# Patient Record
Sex: Male | Born: 1950 | Race: Black or African American | Hispanic: No | Marital: Married | Smoking: Never smoker
Health system: Southern US, Community
[De-identification: ages and names within clinical notes are randomized; demographics above are authoritative.]

## PROBLEM LIST (undated history)

## (undated) DIAGNOSIS — I499 Cardiac arrhythmia, unspecified: Secondary | ICD-10-CM

## (undated) DIAGNOSIS — I679 Cerebrovascular disease, unspecified: Secondary | ICD-10-CM

## (undated) DIAGNOSIS — I10 Essential (primary) hypertension: Secondary | ICD-10-CM

## (undated) DIAGNOSIS — R7303 Prediabetes: Secondary | ICD-10-CM

## (undated) DIAGNOSIS — G4733 Obstructive sleep apnea (adult) (pediatric): Secondary | ICD-10-CM

## (undated) DIAGNOSIS — I6381 Other cerebral infarction due to occlusion or stenosis of small artery: Secondary | ICD-10-CM

## (undated) DIAGNOSIS — I4891 Unspecified atrial fibrillation: Secondary | ICD-10-CM

## (undated) DIAGNOSIS — E7800 Pure hypercholesterolemia, unspecified: Secondary | ICD-10-CM

## (undated) HISTORY — DX: Cerebrovascular disease, unspecified: I67.9

## (undated) HISTORY — PX: EYE SURGERY: SHX253

## (undated) HISTORY — DX: Cardiac arrhythmia, unspecified: I49.9

## (undated) HISTORY — PX: PROSTATE SURGERY: SHX751

## (undated) HISTORY — PX: GALLBLADDER SURGERY: SHX652

## (undated) HISTORY — DX: Other cerebral infarction due to occlusion or stenosis of small artery: I63.81

## (undated) HISTORY — DX: Obstructive sleep apnea (adult) (pediatric): G47.33

---

## 1998-04-11 ENCOUNTER — Emergency Department (HOSPITAL_COMMUNITY): Admission: EM | Admit: 1998-04-11 | Discharge: 1998-04-11 | Payer: Self-pay | Admitting: Emergency Medicine

## 1999-09-12 ENCOUNTER — Encounter: Payer: Self-pay | Admitting: Family Medicine

## 1999-09-12 ENCOUNTER — Encounter: Admission: RE | Admit: 1999-09-12 | Discharge: 1999-09-12 | Payer: Self-pay | Admitting: Family Medicine

## 1999-11-13 ENCOUNTER — Observation Stay (HOSPITAL_COMMUNITY): Admission: EM | Admit: 1999-11-13 | Discharge: 1999-11-14 | Payer: Self-pay | Admitting: Emergency Medicine

## 2000-11-04 ENCOUNTER — Encounter: Payer: Self-pay | Admitting: Family Medicine

## 2000-11-04 ENCOUNTER — Encounter: Admission: RE | Admit: 2000-11-04 | Discharge: 2000-11-04 | Payer: Self-pay | Admitting: Family Medicine

## 2000-12-01 ENCOUNTER — Emergency Department (HOSPITAL_COMMUNITY): Admission: EM | Admit: 2000-12-01 | Discharge: 2000-12-01 | Payer: Self-pay

## 2002-11-24 ENCOUNTER — Encounter: Payer: Self-pay | Admitting: Emergency Medicine

## 2002-11-24 ENCOUNTER — Emergency Department (HOSPITAL_COMMUNITY): Admission: EM | Admit: 2002-11-24 | Discharge: 2002-11-24 | Payer: Self-pay | Admitting: Emergency Medicine

## 2006-01-20 ENCOUNTER — Ambulatory Visit (HOSPITAL_COMMUNITY): Admission: RE | Admit: 2006-01-20 | Discharge: 2006-01-20 | Payer: Self-pay | Admitting: Gastroenterology

## 2008-09-15 ENCOUNTER — Emergency Department (HOSPITAL_COMMUNITY): Admission: EM | Admit: 2008-09-15 | Discharge: 2008-09-16 | Payer: Self-pay | Admitting: Emergency Medicine

## 2008-10-27 ENCOUNTER — Inpatient Hospital Stay (HOSPITAL_COMMUNITY): Admission: RE | Admit: 2008-10-27 | Discharge: 2008-11-03 | Payer: Self-pay | Admitting: Urology

## 2008-10-27 ENCOUNTER — Encounter: Payer: Self-pay | Admitting: Urology

## 2009-09-27 ENCOUNTER — Observation Stay (HOSPITAL_COMMUNITY): Admission: EM | Admit: 2009-09-27 | Discharge: 2009-09-27 | Payer: Self-pay | Admitting: Emergency Medicine

## 2009-10-12 ENCOUNTER — Emergency Department (HOSPITAL_COMMUNITY): Admission: EM | Admit: 2009-10-12 | Discharge: 2009-10-12 | Payer: Self-pay | Admitting: Family Medicine

## 2009-10-12 ENCOUNTER — Emergency Department (HOSPITAL_COMMUNITY): Admission: EM | Admit: 2009-10-12 | Discharge: 2009-10-13 | Payer: Self-pay | Admitting: Emergency Medicine

## 2010-05-17 ENCOUNTER — Emergency Department (HOSPITAL_COMMUNITY): Admission: EM | Admit: 2010-05-17 | Discharge: 2010-05-17 | Payer: Self-pay | Admitting: Emergency Medicine

## 2010-10-04 IMAGING — CT CT PELVIS W/ CM
2 of 6 series · 17 of 46 positions shown, 19 images · IV contrast (APPLIED)
Comparison: None

CT ABDOMEN

CLINICAL DATA: Bloating.  Abdominal pain

CT ABDOMEN AND PELVIS WITH CONTRAST
TECHNIQUE: Multidetector CT imaging of the abdomen and pelvis was
performed using the standard protocol following bolus
administration of intravenous contrast.
Contrast: 100 ml [id number]

[Series 2: abd/pelv with 5.0 b31f st · axial · 0.83mm/px · z∈[-530,-126]mm · 14 of 95 slices shown, 16 images]
[im 7/95  soft-tissue]
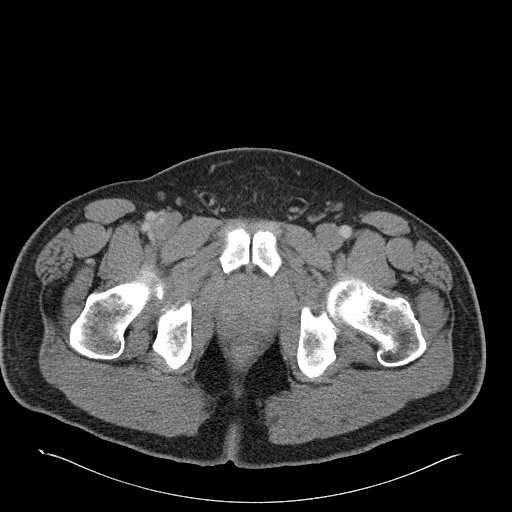
[im 7/95  bone]
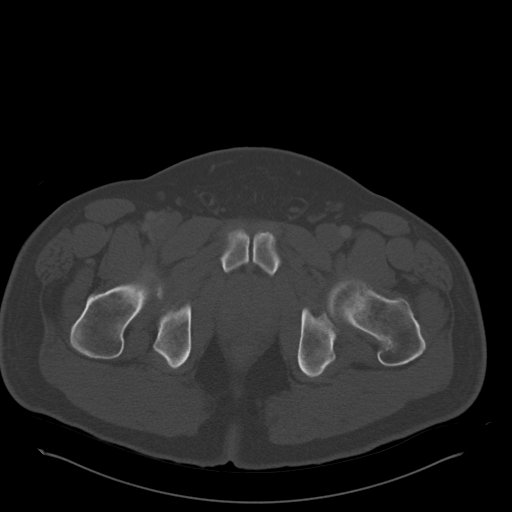
[im 13/95  soft-tissue]
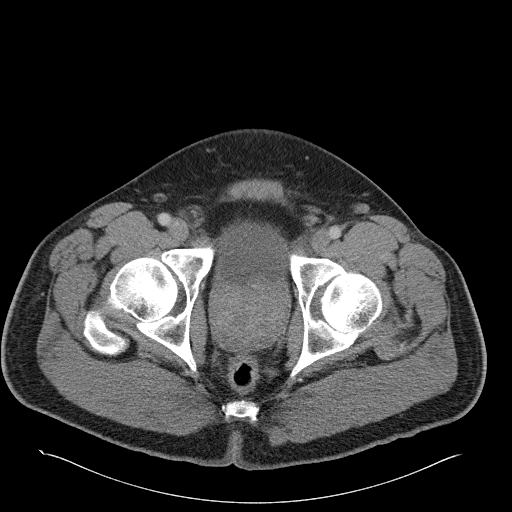
[im 19/95  soft-tissue]
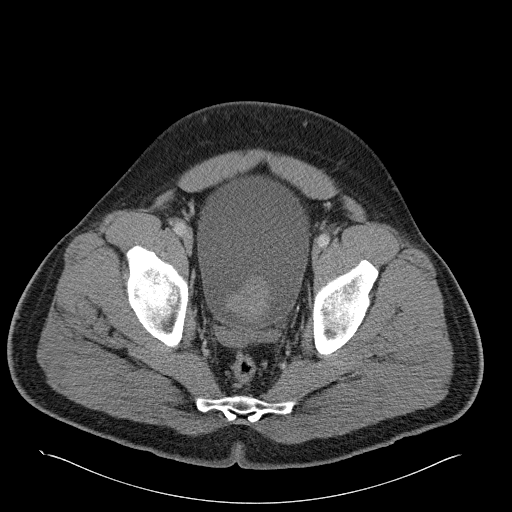
[im 26/95  soft-tissue]
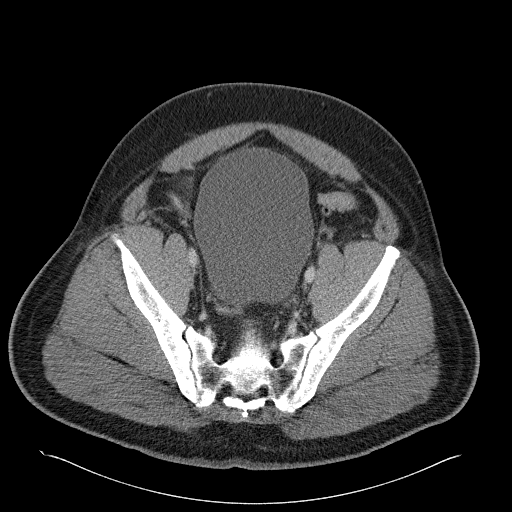
[im 32/95  soft-tissue]
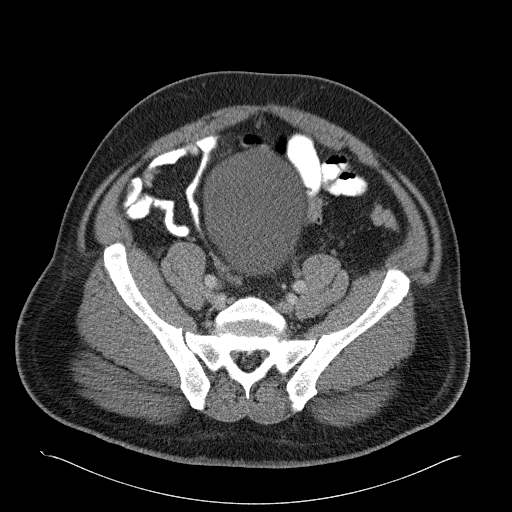
[im 38/95  soft-tissue]
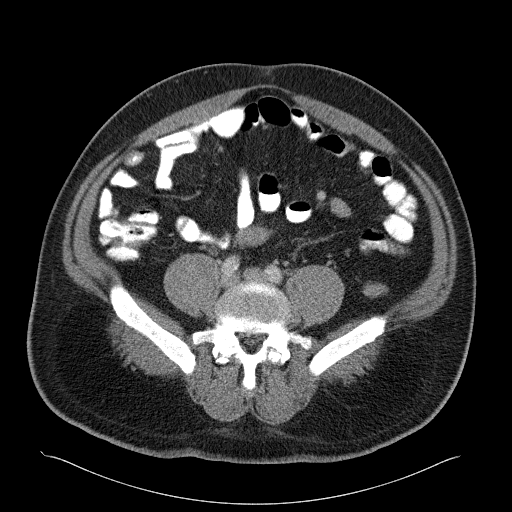
[im 44/95  soft-tissue]
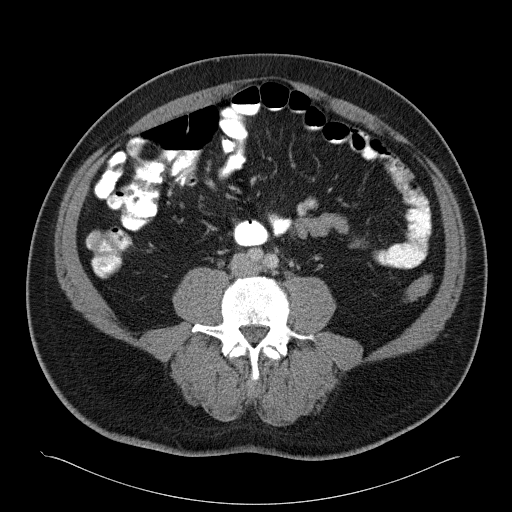
[im 51/95  soft-tissue]
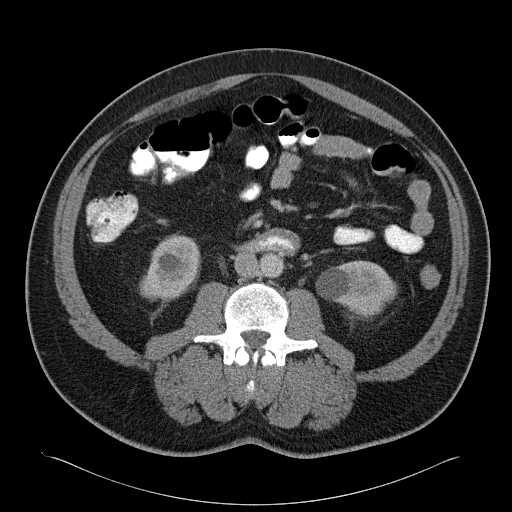
[im 57/95  soft-tissue]
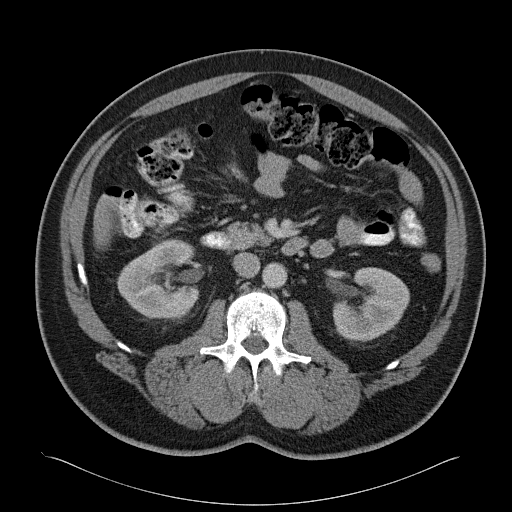
[im 57/95  bone]
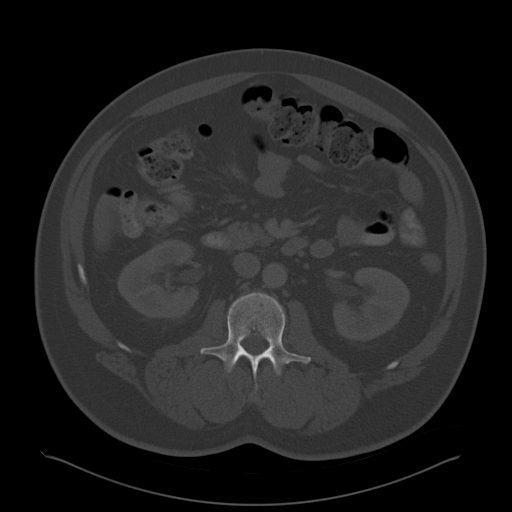
[im 63/95  soft-tissue]
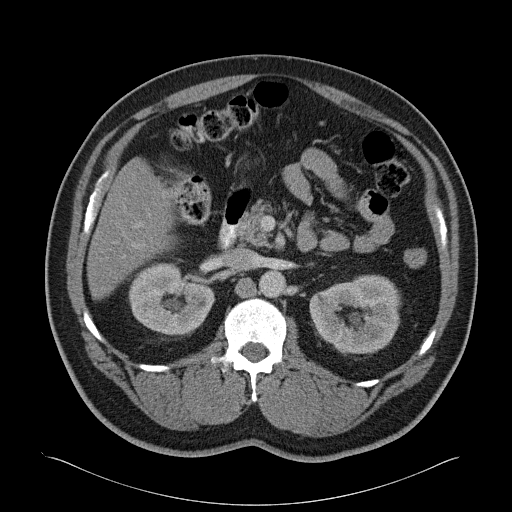
[im 69/95  soft-tissue]
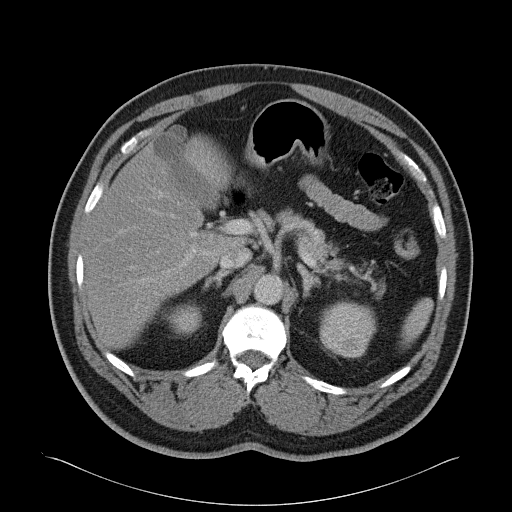
[im 76/95  soft-tissue]
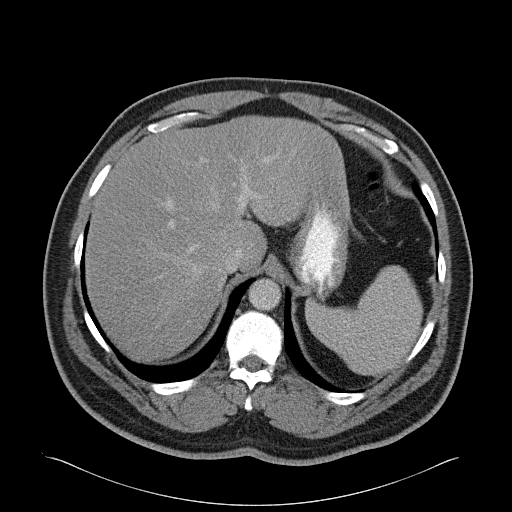
[im 82/95  soft-tissue]
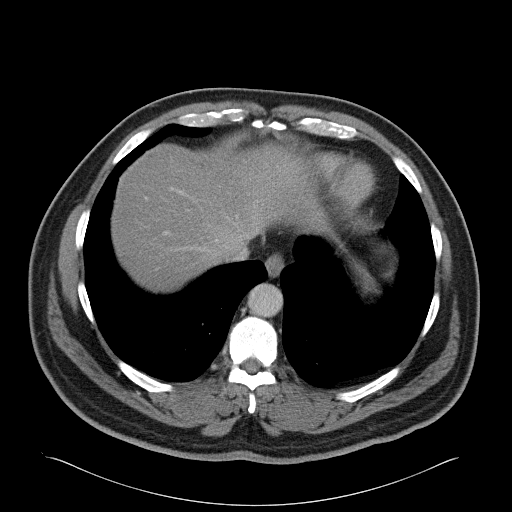
[im 88/95  soft-tissue]
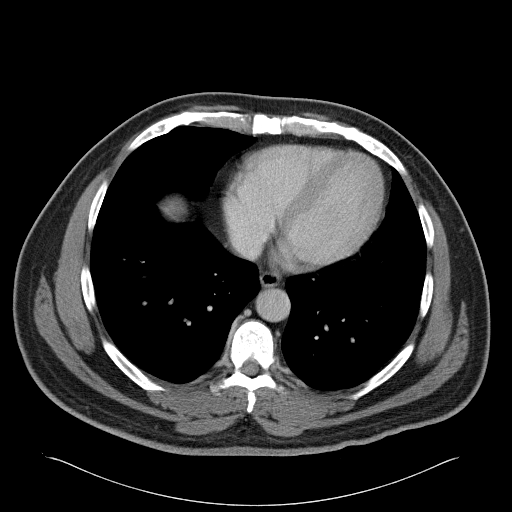

[Series 4: abd/pelv with 2.0 spo cor st · coronal · 0.93mm/px · 3 of 143 slices shown]
[im 48/143  soft-tissue]
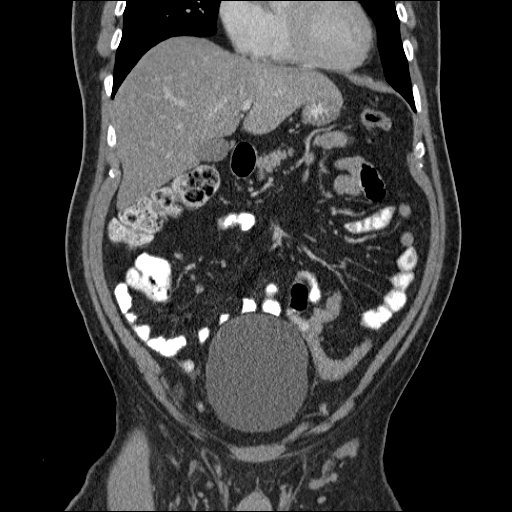
[im 64/143  soft-tissue]
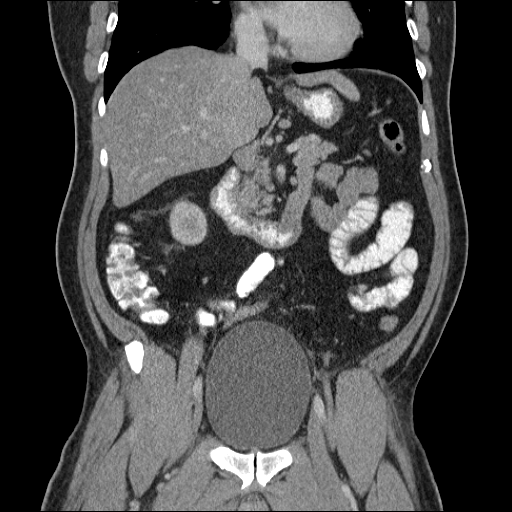
[im 79/143  soft-tissue]
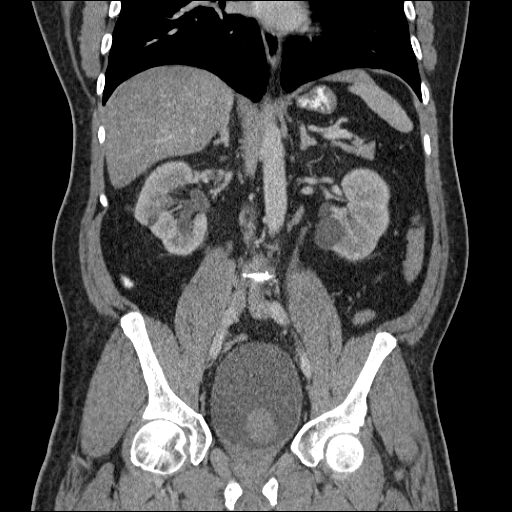

[17 of 46 positions shown; findings below may reference images not displayed]

FINDINGS: Diffuse fatty infiltration of the liver.  The
gallbladder, adrenal glands, spleen, pancreas are within normal
limits.  Multiple hypodensities in the kidneys.  Some are cysts and
others are too small to characterize.  Negative free fluid.
Negative abnormal adenopathy.
IMPRESSION: Fatty liver.  Renal cysts.

CT PELVIS
FINDINGS: Normal appendix.  The bladder is mildly distended
extending into the lower abdomen.  The prostate is prominent and
lobulated.  An element of bladder outlet obstruction at the level
of the prostate is not excluded.  Negative free fluid or abnormal
adenopathy.  Stranding is present in the retroperitoneal fat
surrounding the bladder.  No obvious colonic wall thickening to
suggest diverticulitis.  Diverticulosis of the sigmoid colon is
present.
IMPRESSION: Bladder is distended and prostate is enlarged and lobulated.
Bladder outlet obstruction at the level the prostate is not
excluded.  Correlate with PSA and physical exam.  Stranding about
the bladder may reflect an inflammatory process.  Correlate we
urinalysis.

## 2010-11-11 ENCOUNTER — Emergency Department (HOSPITAL_COMMUNITY)
Admission: EM | Admit: 2010-11-11 | Discharge: 2010-11-12 | Disposition: A | Discharge status: Home / Self Care | Payer: 59 | Attending: Emergency Medicine | Admitting: Emergency Medicine

## 2010-11-11 DIAGNOSIS — E7800 Pure hypercholesterolemia, unspecified: Secondary | ICD-10-CM | POA: Insufficient documentation

## 2010-11-11 DIAGNOSIS — I10 Essential (primary) hypertension: Secondary | ICD-10-CM | POA: Insufficient documentation

## 2010-11-11 DIAGNOSIS — K8020 Calculus of gallbladder without cholecystitis without obstruction: Secondary | ICD-10-CM | POA: Insufficient documentation

## 2010-11-11 DIAGNOSIS — R1011 Right upper quadrant pain: Secondary | ICD-10-CM | POA: Insufficient documentation

## 2010-11-12 ENCOUNTER — Emergency Department (HOSPITAL_COMMUNITY): Payer: 59

## 2010-11-12 LAB — DIFFERENTIAL
Basophils Absolute: 0 10*3/uL (ref 0.0–0.1)
Basophils Relative: 0 % (ref 0–1)
Eosinophils Absolute: 0.1 10*3/uL (ref 0.0–0.7)
Eosinophils Relative: 1 % (ref 0–5)
Monocytes Absolute: 0.8 10*3/uL (ref 0.1–1.0)

## 2010-11-12 LAB — CBC
MCHC: 33.9 g/dL (ref 30.0–36.0)
Platelets: 258 10*3/uL (ref 150–400)
RDW: 14.6 % (ref 11.5–15.5)

## 2010-11-12 LAB — COMPREHENSIVE METABOLIC PANEL
ALT: 33 U/L (ref 0–53)
AST: 47 U/L — ABNORMAL HIGH (ref 0–37)
Albumin: 3.9 g/dL (ref 3.5–5.2)
Calcium: 9.5 mg/dL (ref 8.4–10.5)
GFR calc Af Amer: >60 mL/min (ref >60)
Glucose, Bld: 119 mg/dL — ABNORMAL HIGH (ref 70–99)
Sodium: 138 mEq/L (ref 135–145)
Total Protein: 7.7 g/dL (ref 6.0–8.3)

## 2010-11-12 LAB — LIPASE, BLOOD: Lipase: 24 U/L (ref 11–59)

## 2010-12-23 LAB — COMPREHENSIVE METABOLIC PANEL
Alkaline Phosphatase: 93 U/L (ref 39–117)
BUN: 15 mg/dL (ref 6–23)
Glucose, Bld: 135 mg/dL — ABNORMAL HIGH (ref 70–99)
Potassium: 4.5 mEq/L (ref 3.5–5.1)
Total Bilirubin: 0.4 mg/dL (ref 0.3–1.2)
Total Protein: 7 g/dL (ref 6.0–8.3)

## 2010-12-23 LAB — CBC
HCT: 41.7 % (ref 39.0–52.0)
Hemoglobin: 14 g/dL (ref 13.0–17.0)
RDW: 15.3 % (ref 11.5–15.5)

## 2010-12-23 LAB — LIPASE, BLOOD: Lipase: 15 U/L (ref 11–59)

## 2010-12-23 LAB — DIFFERENTIAL
Basophils Absolute: 0 10*3/uL (ref 0.0–0.1)
Basophils Relative: 0 % (ref 0–1)
Monocytes Relative: 7 % (ref 3–12)
Neutro Abs: 6.2 10*3/uL (ref 1.7–7.7)
Neutrophils Relative %: 68 % (ref 43–77)

## 2010-12-23 LAB — POCT CARDIAC MARKERS: Myoglobin, poc: 92.4 ng/mL (ref 12–200)

## 2011-01-07 LAB — URINE MICROSCOPIC-ADD ON

## 2011-01-07 LAB — DIFFERENTIAL
Basophils Absolute: 0 10*3/uL (ref 0.0–0.1)
Basophils Relative: 0 % (ref 0–1)
Eosinophils Relative: 0 % (ref 0–5)
Lymphocytes Relative: 4 % — ABNORMAL LOW (ref 12–46)
Neutro Abs: 10.5 10*3/uL — ABNORMAL HIGH (ref 1.7–7.7)

## 2011-01-07 LAB — URINALYSIS, ROUTINE W REFLEX MICROSCOPIC
Bilirubin Urine: NEGATIVE
Nitrite: NEGATIVE
Protein, ur: NEGATIVE mg/dL
Specific Gravity, Urine: 1.024 (ref 1.005–1.030)
Urobilinogen, UA: 1 mg/dL (ref 0.0–1.0)

## 2011-01-07 LAB — COMPREHENSIVE METABOLIC PANEL
AST: 27 U/L (ref 0–37)
Alkaline Phosphatase: 118 U/L — ABNORMAL HIGH (ref 39–117)
BUN: 16 mg/dL (ref 6–23)
CO2: 28 mEq/L (ref 19–32)
Chloride: 101 mEq/L (ref 96–112)
Creatinine, Ser: 1.3 mg/dL (ref 0.4–1.5)
GFR calc non Af Amer: 56 mL/min — ABNORMAL LOW (ref >60)
Total Bilirubin: 1.3 mg/dL — ABNORMAL HIGH (ref 0.3–1.2)

## 2011-01-07 LAB — CBC
HCT: 48.9 % (ref 39.0–52.0)
MCV: 84.3 fL (ref 78.0–100.0)
RBC: 5.79 MIL/uL (ref 4.22–5.81)
WBC: 11.5 10*3/uL — ABNORMAL HIGH (ref 4.0–10.5)

## 2011-01-07 LAB — LIPASE, BLOOD: Lipase: 16 U/L (ref 11–59)

## 2011-01-21 LAB — CBC
HCT: 22.5 % — ABNORMAL LOW (ref 39.0–52.0)
HCT: 23.2 % — ABNORMAL LOW (ref 39.0–52.0)
HCT: 29.3 % — ABNORMAL LOW (ref 39.0–52.0)
HCT: 30.3 % — ABNORMAL LOW (ref 39.0–52.0)
HCT: 33.5 % — ABNORMAL LOW (ref 39.0–52.0)
HCT: 43.8 % (ref 39.0–52.0)
Hemoglobin: 10.2 g/dL — ABNORMAL LOW (ref 13.0–17.0)
Hemoglobin: 7.5 g/dL — CL (ref 13.0–17.0)
Hemoglobin: 7.8 g/dL — CL (ref 13.0–17.0)
MCHC: 32.7 g/dL (ref 30.0–36.0)
MCHC: 33.4 g/dL (ref 30.0–36.0)
MCV: 83.2 fL (ref 78.0–100.0)
MCV: 84.6 fL (ref 78.0–100.0)
MCV: 84.9 fL (ref 78.0–100.0)
Platelets: 203 10*3/uL (ref 150–400)
Platelets: 223 10*3/uL (ref 150–400)
Platelets: 233 10*3/uL (ref 150–400)
Platelets: 245 10*3/uL (ref 150–400)
Platelets: 249 10*3/uL (ref 150–400)
RBC: 2.75 MIL/uL — ABNORMAL LOW (ref 4.22–5.81)
RBC: 2.79 MIL/uL — ABNORMAL LOW (ref 4.22–5.81)
RDW: 14.7 % (ref 11.5–15.5)
RDW: 14.7 % (ref 11.5–15.5)
RDW: 15 % (ref 11.5–15.5)
RDW: 15.1 % (ref 11.5–15.5)
WBC: 10.7 10*3/uL — ABNORMAL HIGH (ref 4.0–10.5)
WBC: 13.4 10*3/uL — ABNORMAL HIGH (ref 4.0–10.5)
WBC: 15.6 10*3/uL — ABNORMAL HIGH (ref 4.0–10.5)
WBC: 7.3 10*3/uL (ref 4.0–10.5)
WBC: 7.8 10*3/uL (ref 4.0–10.5)

## 2011-01-21 LAB — BASIC METABOLIC PANEL
BUN: 12 mg/dL (ref 6–23)
BUN: 12 mg/dL (ref 6–23)
BUN: 14 mg/dL (ref 6–23)
BUN: 20 mg/dL (ref 6–23)
Calcium: 7.8 mg/dL — ABNORMAL LOW (ref 8.4–10.5)
Calcium: 8.1 mg/dL — ABNORMAL LOW (ref 8.4–10.5)
Chloride: 103 mEq/L (ref 96–112)
Chloride: 105 mEq/L (ref 96–112)
GFR calc Af Amer: >60 mL/min (ref >60)
GFR calc non Af Amer: 33 mL/min — ABNORMAL LOW (ref >60)
GFR calc non Af Amer: >60 mL/min (ref >60)
GFR calc non Af Amer: >60 mL/min (ref >60)
Glucose, Bld: 119 mg/dL — ABNORMAL HIGH (ref 70–99)
Glucose, Bld: 144 mg/dL — ABNORMAL HIGH (ref 70–99)
Potassium: 3.7 mEq/L (ref 3.5–5.1)
Potassium: 4 mEq/L (ref 3.5–5.1)
Potassium: 4.4 mEq/L (ref 3.5–5.1)
Potassium: 4.6 mEq/L (ref 3.5–5.1)
Sodium: 135 mEq/L (ref 135–145)
Sodium: 138 mEq/L (ref 135–145)

## 2011-01-21 LAB — DIFFERENTIAL
Basophils Absolute: 0 10*3/uL (ref 0.0–0.1)
Eosinophils Relative: 0 % (ref 0–5)
Lymphocytes Relative: 9 % — ABNORMAL LOW (ref 12–46)
Lymphs Abs: 1.4 10*3/uL (ref 0.7–4.0)
Neutro Abs: 12.6 10*3/uL — ABNORMAL HIGH (ref 1.7–7.7)

## 2011-01-21 LAB — CARDIAC PANEL(CRET KIN+CKTOT+MB+TROPI)
CK, MB: 0.9 ng/mL (ref 0.3–4.0)
CK, MB: 1 ng/mL (ref 0.3–4.0)
Relative Index: INVALID (ref 0.0–2.5)
Relative Index: INVALID (ref 0.0–2.5)
Total CK: 66 U/L (ref 7–232)
Total CK: 81 U/L (ref 7–232)

## 2011-01-21 LAB — HEMOGLOBIN AND HEMATOCRIT, BLOOD
HCT: 24.5 % — ABNORMAL LOW (ref 39.0–52.0)
Hemoglobin: 8.2 g/dL — ABNORMAL LOW (ref 13.0–17.0)

## 2011-01-21 LAB — CREATININE, FLUID (PLEURAL, PERITONEAL, JP DRAINAGE): Creat, Fluid: 34.6 mg/dL

## 2011-01-21 LAB — CROSSMATCH: Antibody Screen: NEGATIVE

## 2011-02-19 NOTE — Op Note (Signed)
NAME:  Joseph Lamb, Joseph Lamb NO.:  [id number]   MEDICAL RECORD NO.:  [id number]          PATIENT TYPE:  INP   LOCATION:  0002                         FACILITY:  [facility]   PHYSICIAN:  Bertram Millard. Dahlstedt, M.D.DATE OF BIRTH:  1950-11-18   DATE OF PROCEDURE:  10/27/2008  DATE OF DISCHARGE:                               OPERATIVE REPORT   ASSISTANT:  Dr. Duane Boston.   PROCEDURES:  1. Suprapubic prostatectomy.  2. Placement of suprapubic tube.   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   INDICATIONS:  This is a 60 year old gentleman with persistent symptoms  of bladder outlet obstruction causing lower urinary tract symptoms and  retention.  Due to his gland size, he elected to proceed with suprapubic  prostatectomy after discussion of all of the possible procedures.  The  risks and benefits were outlined with the patient prior to undertaking  the operation.   FINDINGS:  Large prostatic adenoma with median lobe.   PROCEDURE IN DETAIL:  The patient was brought back to the operating room  and placed supine on the operating room table.  After the successful  induction of general endotracheal anesthetic, he was prepped and draped  in the usual sterile fashion.  Sequential compression devices were  placed on his lower extremities.  A preoperative timeout was performed,  and he received preoperative antibiotics.   A lower midline incision was made.  After dissecting through the skin  and subcutaneous tissues, we identified the fascia.  This was entered  with cautery.  The bellies of the rectus muscle and the pyramidalis were  split in the midline, identifying the retropubic space.  A 24-French  Foley catheter was then inserted, the bladder was filled, and the  bladder was easily identified.  A vertical cystotomy incision was made.  A Bookwalter  retractor setup was then used for adequate retraction of  the bladder neck.  A large prostatic median lobe was visible.  We were  able to identify the ureteral orifices and note that they were a fair  amount of distance away from the median lobe.  The urothelium was scored  over the prostate to the bladder neck and then using finger dissection,  the adenoma was enucleated from the rest of the prostate.  This was  delivered through the bladder and sent as specimen.  We then inspected  the wound with a combination of suction and handheld malleable  retraction.  Two figure-of-eight chromic sutures were placed at the 5  and 7 o'clock positions for hemostasis.   At this point, we re-inspected the prostatic fossa and there was  continued bleeding.  We placed Gelfoam and FloSeal into the fossa and  with some Foley catheter traction were able to achieve adequate  hemostasis.  The suprapubic tube was then brought in through the right  flank with the 24-French Foley catheter.  A pursestring suture was  placed at the insertion site of the bladder and the Foley was secured to  the skin with silk suture.  A  Blake drain was placed through the left  side of the incision and the drain was secured with silk suture as well.  At this point, there was no visible bleeding and we proceeded with  closing the bladder in two separate layers.  The fascia was then  reapproximated with PDS suture, the subcutaneous tissues were  reapproximated of Vicryl, and the skin was reapproximated with staples.  At this point all catheters were irrigated.  One clot was seen  emanating, however, on continuous bladder irrigation from the suprapubic  tube and the urine was light pink.  Traction was continued on the  urethral Foley catheter and the procedure was ended.  Please note Dr.  Retta Diones was present throughout the entirety of the case.  Estimated  blood loss 1.6 liters.  Urine output unrecorded.   DRAINS:  1. Suprapubic  tube.  2. Foley catheter.  3. JP.   COMPLICATIONS:  None apparent.   DISPOSITION:  The patient went to the PACU for further care.     ______________________________  Duane Boston, MD      Bertram Millard. Dahlstedt, M.D.  Electronically Signed    BP/MEDQ  D:  10/27/2008  T:  10/27/2008  Job:  [id number]

## 2011-02-22 NOTE — Consult Note (Signed)
[facility]  Patient:    Joseph Lamb, Joseph Lamb                    MRN: [id number] Proc. Date: 11/14/99 Adm. Date:  [id number] Attending:  Anastasio Auerbach Dictator:   Thereasa Solo. Little, M.D. CC:         Anastasio Auerbach, M.D.             Desma Maxim, M.D.                          Consultation Report  HISTORY OF PRESENT ILLNESS:  Joseph Lamb is a 60 year old male I am seeing in  consultation for Dr. Sheppard Penton.  He was admitted November 13, 1999 with SVT and converted with adenosine.  He gives a history of having short episodes of SVT since he was a child. Usually lasting only one to two minutes and resolving spontaneously.  There has been no  associated dizziness or light-headedness, or chest pain.  He has been completely unlimited from a physical standpoint and is quite active and robust.  Yesterday he walked into the house, doing no real strenuous activity before, and noticed his heart beat forceful and then very rapid.  He did not feel dizzy or light-headed with this.  He checked his blood pressure, which was fine, but his  heart rate was 170 at home.  He apparently contacted Dr. Arvilla Market and ended up n the emergency room, where he had a supraventricular tachycardia with rate of 165. Adenosine 12 mg was given and he converted into sinus rhythm.  It apparently took two boluses according to the ER chart.  With this his rate slowed into the 70s nd his blood pressure was 115/76.  Since admission to the hospital on telemetry he has had no recurrent arrhythmia now for the last 18 hours.  His cardiac enzymes have been negative.  An echocardiogram is ordered but has not been performed at this time.  ALLERGIES:  No allergies.  MEDICATIONS:  The patient was started about three months ago on some experimental medication on a drug study, questionable ACE inhibitor.  FAMILY HISTORY:  He has a son with a murmur, an uncle with a murmur.  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  SOCIAL HISTORY:  The patient is married.  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  No cigarettes in years.  No alcohol in 15 years.  No illicit drugs. Minimal caffeine intake.  REVIEW OF SYSTEMS:  The patient is active but does not formally exercise.  He is on no real dietary restrictions other than slight decrease and trying to stay away  from obviously greasy foods.  He has no polyuria, polyphagia, polydipsia.  His weight has been stable in the 240 pound range.  He has tiredness in his legs after working a long day but does not have claudication symptoms or peripheral swelling. He denies any exertional chest pain, PND, or orthopnea.  He was told as a child that he had a heart murmur.  He played sports as a young man and would become fatigued disproportionate to his counterparts, but never had any prolonged episodes of tachycardia with exercise that he is aware of.  The episodes that he has had have all been associated usually with rest.  He has no joint complaints, no GI complaints.  He has rare nocturia with no dysuria.  He  has some snoring.  He has had no unilateral weakness or blurred vision.  PHYSICAL EXAMINATION:  VITAL SIGNS:  Blood pressure is 110 to 120/70 and his pulses range from 50 to 60, with occasional episodes last night in the upper 40s, all sinus.  He is afebrile. Oxygen saturations are 96%.  GENERAL:  Healthy male, no acute distress.  Slightly overweight.  NECK:  Thyroid not palpable.  No carotid bruits.  Neck supple, no JVD.  LUNGS:  Completely clear, forced expiration, no wheeze.  CARDIAC:  Distant heart sounds, no murmur.  Slow rate.  No ectopic beats.  No chest wall tenderness.  ABDOMEN:  Obese, soft, nontender; no mass.  I cannot palpate a liver edge.  EXTREMITIES:  He has no carotid or femoral bruits.  He has good pulses in his lower extremities.  He has no tenderness in his  joints.  SKIN:  Warm and dry.  NEUROLOGIC:  Intact.  ASSESSMENT: 1.  Paroxysmal supraventricular tachycardia, which sounds like this has been a     long-going problem, with this being the most prolonged episode he has had.  I agree with Dr. Sheral Flow excellent choice of Verapamil at 180 mg q.d.  This     should control his blood pressure and reduce the episodes of PSVT.  I did     discuss in detail the possibility with his bradycardia that he could have     early sick sinus syndrome, although this is clearly not an operating diagnosis     at this point.      I have instructed him to stay away from caffeinated beverage, over-the-counter     medications that induce tachycardia, alcohol, etc. 2.  Hypertension, well controlled.      His cardiac enzymes are negative thus far.  His repeat EKG is unremarkable     except for sinus bradycardia.  The remainder of his lab work is all within     normal limits, with his TSH still pending.      I have stopped his oxygen, changed his IV to heparin-well, encouraged him     to walk in the hall.  If he has no recurrent arrhythmias there is no reason     he cannot be discharged after his echocardiogram is done.  If there is in fact     some abnormality on his echocardiogram I will contact him and have him come to     the office to discuss the implications. DD:  11/14/99 TD:  11/14/99 Job: [id number]

## 2011-02-22 NOTE — Discharge Summary (Signed)
NAME:  Joseph Lamb, Joseph Lamb NO.:  [id number]   MEDICAL RECORD NO.:  [id number]          PATIENT TYPE:  INP   LOCATION:  1424                         [facility]ACILITY[facility]  [facility]   PHYSICIAN:  Bertram Millard. Dahlstedt, M.D.DATE OF BIRTH:  [birth year]   DATE OF ADMISSION:  10/27/2008  DATE OF DISCHARGE:  11/03/2008                               DISCHARGE SUMMARY   ADMISSION DIAGNOSIS:  Benign prostatic hyperplasia with urinary  retention.   DISCHARGE DIAGNOSIS:  Benign prostatic hyperplasia with retention.   PROCEDURE:  Suprapubic prostatectomy with bladder biopsy.   SERVICE:  The patient was admitted under urology, Dr. Retta Diones.   HISTORY:  The patient is a 60 year old gentleman who had longstanding  benign prostatic hyperplasia, had recently developed retention of urine  requiring indwelling Foley catheter.  He also had an uncomplicated  course with developing gross hematuria.  The patient requesting  definitive management.  He was evaluated with an office cystoscopy which  showed enlarged lateral lobes and large median lobe.  The different  treatment options were discussed, including transurethral resection of  the prostate with suprapubic prostatectomy.  The patient opted for  suprapubic prostatectomy.   HOSPITAL COURSE:  The patient underwent an uncomplicated procedure on  October 27, 2008.  He underwent a suprapubic prostatectomy and bladder  biopsy.  The patient after a brief stay in the recovery room was  transferred to a regular floor bed.  The patient's postop labs were  fine.  The patient had a urethral catheter and a suprapubic tube and a  JP drain.  The patient initially was on IV PCA.  On the day of the  surgery, the patient continued to do well.  He was hemodynamically  stable.  However, overnight there was some concern of catheters not  draining which were flushed and subsequently they were draining fine.  On the first postoperative day, the patient continued to do  well.  He  was continued on continuous bladder irrigation which was light pink.  He  was hemodynamically stable.  His diet was advanced and he was  recommended to be out of bed ambulating which he did.  The second  postoperative day the patient continued to do well.  Lab work showed his  hemoglobin came down to 7.8 from 10 on the first postoperative day.  He  had increased drainage from the JP drain.  An ostomy appliance was  placed across the JP drain.  He was continued on continuous bladder  irrigation.  On postoperative day #3, the patient hemodynamically was  stable.  His hemoglobin was stable at 7.9.  He was otherwise doing well,  tolerating a diet, comfortable.  The continuous bladder irrigation was  continued.  On postoperative day #4, the patient continued to do well.  His hemoglobin was stable at 7.5, however, at that point the decision  was made to transfuse him 2 units of blood.  Post transfusion hemoglobin  came up to 10.2.  Postop day #5, patient hemodynamically stable,  tolerating diet, having bowel movements, JP drain had decreased.  His  ureteral catheter was plugged.  Postoperative day #6,  Home Health Care  consult was obtained.  The patient's JP drain was removed.  The patient  continued to do well.  Postoperative day #7, on November 03, 2008, the  patient continued to do well.  The patient was ready to go home.  Instructions were given to him to start plugging his suprapubic catheter  and his ureteral catheter was removed for him to do trial of void.  He  was also instructed to check postvoid residual, band clamping the  suprapubic tube after the void.   DISCHARGE CONDITION:  Stable.   DISPOSITION:  Home.   MEDICATIONS:  1. Cipro.  2. Colace.  3. Hydrocodone.  4. Oxybutynin.   DISCHARGE INSTRUCTIONS:  The patient was instructed to start trial of  voids by clamping the suprapubic tube and then clamping to check  postvoid residual starting on Saturday.  He was  instructed to give [place] a  call if he develops high grade temperature to 101.5, uncontrollable  pain, intractable nausea or vomiting, difficulty with catheter.   FOLLOWUP APPOINTMENT:  The patient will be followed in the urology  clinic with Dr. Retta Diones in a week or so.     ______________________________  Georgeanna Lea Urology Resident      Bertram Millard. Dahlstedt, M.D.  Electronically Signed    JJ/MEDQ  D:  12/12/2008  T:  12/12/2008  Job:  [id number]

## 2011-02-22 NOTE — Op Note (Signed)
NAME:  Joseph Lamb, Joseph Lamb             ACCOUNT NO.:  [id number]   MEDICAL RECORD NO.:  [id number]          PATIENT TYPE:  AMB   LOCATION:  ENDO                         FACILITY:  [facility]   PHYSICIAN:  Roarke L. Malon Kindle., M.D.DATE OF BIRTH:  06-16-1951   DATE OF PROCEDURE:  01/20/2006  DATE OF DISCHARGE:                                 OPERATIVE REPORT   PROCEDURE:  Colonoscopy.   MEDICATIONS:  Fentanyl 125 mcg, Versed 10 mg IV.   INDICATIONS FOR PROCEDURE:  Colon cancer screening.   DESCRIPTION OF PROCEDURE:  The procedure explained to the patient and  consent obtained. With the patient in the left lateral decubitus position,  the Olympus pediatric adjustable scope was inserted and advanced. The prep  was excellent. Using abdominal pressure and position changes, we were able  to reach the cecum, the ileocecal valve and appendiceal orifice were seen.  The scope was withdrawn and the mucosa carefully examined. The cecum,  ascending, transverse, descending and sigmoid were seen well. Scattered  diverticula in the sigmoid colon, rectum free of polyps. The scope  withdrawn, patient tolerated the procedure well.   ASSESSMENT:  1.  Scattered diverticula in the sigmoid colon, 562.10.  2.  Normal screening colon, V76.51.   PLAN:  Will recommend routine followup with the primary care physician, Dr.  Donia Guiles, with yearly Hemoccult's and possibly repeat colonoscopy in  10 years.           ______________________________  Llana Aliment Malon Kindle., M.D.     Waldron Session  D:  01/20/2006  T:  01/20/2006  Job:  [id number]   cc:   Donia Guiles, M.D.  Fax: [phone]

## 2011-05-15 ENCOUNTER — Other Ambulatory Visit: Payer: Self-pay | Admitting: Family Medicine

## 2011-05-15 DIAGNOSIS — R413 Other amnesia: Secondary | ICD-10-CM

## 2011-05-16 ENCOUNTER — Other Ambulatory Visit: Payer: Self-pay | Admitting: Family Medicine

## 2011-05-16 DIAGNOSIS — Z77018 Contact with and (suspected) exposure to other hazardous metals: Secondary | ICD-10-CM

## 2011-05-17 ENCOUNTER — Ambulatory Visit
Admission: RE | Admit: 2011-05-17 | Discharge: 2011-05-17 | Disposition: A | Discharge status: Home / Self Care | Payer: 59 | Source: Ambulatory Visit | Attending: Family Medicine | Admitting: Family Medicine

## 2011-05-17 DIAGNOSIS — Z77018 Contact with and (suspected) exposure to other hazardous metals: Secondary | ICD-10-CM

## 2011-05-18 ENCOUNTER — Ambulatory Visit
Admission: RE | Admit: 2011-05-18 | Discharge: 2011-05-18 | Disposition: A | Discharge status: Home / Self Care | Payer: 59 | Source: Ambulatory Visit | Attending: Family Medicine | Admitting: Family Medicine

## 2011-05-18 DIAGNOSIS — R413 Other amnesia: Secondary | ICD-10-CM

## 2011-07-12 LAB — DIFFERENTIAL
Basophils Absolute: 0 10*3/uL (ref 0.0–0.1)
Basophils Relative: 0 % (ref 0–1)
Lymphocytes Relative: 7 % — ABNORMAL LOW (ref 12–46)
Monocytes Relative: 8 % (ref 3–12)
Neutro Abs: 10.8 10*3/uL — ABNORMAL HIGH (ref 1.7–7.7)
Neutrophils Relative %: 85 % — ABNORMAL HIGH (ref 43–77)

## 2011-07-12 LAB — COMPREHENSIVE METABOLIC PANEL
Alkaline Phosphatase: 115 U/L (ref 39–117)
BUN: 19 mg/dL (ref 6–23)
Calcium: 9.1 mg/dL (ref 8.4–10.5)
Creatinine, Ser: 1.46 mg/dL (ref 0.4–1.5)
Glucose, Bld: 126 mg/dL — ABNORMAL HIGH (ref 70–99)
Potassium: 4.5 mEq/L (ref 3.5–5.1)
Total Protein: 7.5 g/dL (ref 6.0–8.3)

## 2011-07-12 LAB — CBC
HCT: 46.5 % (ref 39.0–52.0)
Hemoglobin: 15.2 g/dL (ref 13.0–17.0)
MCHC: 32.7 g/dL (ref 30.0–36.0)
MCV: 84.8 fL (ref 78.0–100.0)
RDW: 15 % (ref 11.5–15.5)

## 2011-07-12 LAB — POCT URINALYSIS DIP (DEVICE)
Ketones, ur: NEGATIVE mg/dL
Protein, ur: NEGATIVE mg/dL
Specific Gravity, Urine: <=1.005 (ref 1.005–1.030)
pH: 5 (ref 5.0–8.0)

## 2012-01-07 ENCOUNTER — Emergency Department (INDEPENDENT_AMBULATORY_CARE_PROVIDER_SITE_OTHER)
Admission: EM | Admit: 2012-01-07 | Discharge: 2012-01-07 | Disposition: A | Discharge status: Home / Self Care | Payer: 59 | Source: Home / Self Care | Attending: Emergency Medicine | Admitting: Emergency Medicine

## 2012-01-07 ENCOUNTER — Encounter (HOSPITAL_COMMUNITY): Payer: Self-pay | Admitting: *Deleted

## 2012-01-07 ENCOUNTER — Emergency Department (INDEPENDENT_AMBULATORY_CARE_PROVIDER_SITE_OTHER): Payer: 59

## 2012-01-07 DIAGNOSIS — J209 Acute bronchitis, unspecified: Secondary | ICD-10-CM

## 2012-01-07 HISTORY — DX: Cardiac arrhythmia, unspecified: I49.9

## 2012-01-07 HISTORY — DX: Pure hypercholesterolemia, unspecified: E78.00

## 2012-01-07 HISTORY — DX: Prediabetes: R73.03

## 2012-01-07 MED ORDER — ALBUTEROL SULFATE HFA 108 (90 BASE) MCG/ACT IN AERS: 1.0000 | INHALATION_SPRAY | Freq: Four times a day (QID) | RESPIRATORY_TRACT | Status: DC | PRN

## 2012-01-07 MED ORDER — AMOXICILLIN 500 MG PO CAPS: 1000.0000 mg | ORAL_CAPSULE | Freq: Three times a day (TID) | ORAL | Status: AC

## 2012-01-07 MED ORDER — BENZONATATE 200 MG PO CAPS: 200.0000 mg | ORAL_CAPSULE | Freq: Three times a day (TID) | ORAL | Status: AC | PRN

## 2012-01-07 NOTE — ED Notes (Signed)
Pt c/o sinus congestion, headache, chest congestion and cough onset Friday.  States he gets some clear phlegm tinged with blood occasionally.  C/o pressure type headache.  No fever that he is aware of, has felt achy.

## 2012-01-07 NOTE — ED Provider Notes (Signed)
Chief Complaint  Patient presents with  . URI    History of Present Illness:   Mr. Joseph Lamb is a 61 year old male who has had a four-day history of sinus pressure, headache, nasal congestion clear rhinorrhea, sore throat, cough productive of yellowish sputum with blood, burning in the chest, and anorexia. He denies fevers, chills, sweats, nausea, vomiting, or diarrhea.  Review of Systems:  Other than noted above, the patient denies any of the following symptoms. Systemic:  No fever, chills, sweats, fatigue, myalgias, headache, or anorexia. Eye:  No redness, pain or drainage. ENT:  No earache, nasal congestion, rhinorrhea, sinus pressure, or sore throat. Lungs:  No cough, sputum production, wheezing, shortness of breath. Or chest pain. GI:  No nausea, vomiting, abdominal pain or diarrhea. Skin:  No rash or itching.  PMFSH:  Past medical history, family history, social history, meds, and allergies were reviewed.  Physical Exam:   Vital signs:  BP 137/54  Pulse 70  Temp(Src) 99.6 F (37.6 C) (Oral)  Resp 16  SpO2 99% General:  Alert, in no distress. Eye:  No conjunctival injection or drainage. ENT:  TMs and canals were normal, without erythema or inflammation.  Nasal mucosa was clear and uncongested, without drainage.  Mucous membranes were moist.  Pharynx was erythematous without exudate or drainage.  There were no oral ulcerations or lesions. Neck:  Supple, no adenopathy, tenderness or mass. Lungs:  No respiratory distress.  Lungs were clear to auscultation, without wheezes, rales or rhonchi.  Breath sounds were clear and equal bilaterally. Heart:  Regular rhythm, without gallops, murmers or rubs. Skin:  Clear, warm, and dry, without rash or lesions.    Radiology:  Dg Chest 2 View  01/07/2012  *RADIOLOGY REPORT*  Clinical Data: Cough, hemoptysis  CHEST - 2 VIEW  Comparison: 10/13/2009  Findings: Cardiomediastinal silhouette is stable.  No acute infiltrate or pleural effusion.  No  pulmonary edema.  Mild elevation of the right hemidiaphragm again noted.  IMPRESSION: No active disease.  No significant change.  Original Report Authenticated By: Natasha Mead, M.D.    Assessment:   Diagnoses that have been ruled out:  None  Diagnoses that are still under consideration:  None  Final diagnoses:  Acute bronchitis      Plan:   1.  The following meds were prescribed:   New Prescriptions   ALBUTEROL (PROVENTIL HFA;VENTOLIN HFA) 108 (90 BASE) MCG/ACT INHALER    Inhale 1-2 puffs into the lungs every 6 (six) hours as needed for wheezing.   AMOXICILLIN (AMOXIL) 500 MG CAPSULE    Take 2 capsules (1,000 mg total) by mouth 3 (three) times daily.   BENZONATATE (TESSALON) 200 MG CAPSULE    Take 1 capsule (200 mg total) by mouth 3 (three) times daily as needed for cough.   2.  The patient was instructed in symptomatic care and handouts were given. 3.  The patient was told to return if becoming worse in any way, if no better in 3 or 4 days, and given some red flag symptoms that would indicate earlier return.   Reuben Likes, MD 01/07/12 [phone]

## 2012-01-07 NOTE — Discharge Instructions (Signed)

## 2012-09-08 ENCOUNTER — Encounter (HOSPITAL_COMMUNITY): Payer: Self-pay | Admitting: *Deleted

## 2012-09-08 ENCOUNTER — Emergency Department (INDEPENDENT_AMBULATORY_CARE_PROVIDER_SITE_OTHER)
Admission: EM | Admit: 2012-09-08 | Discharge: 2012-09-08 | Disposition: A | Discharge status: Home / Self Care | Payer: Self-pay | Source: Home / Self Care | Attending: Family Medicine | Admitting: Family Medicine

## 2012-09-08 DIAGNOSIS — S90415A Abrasion, left lesser toe(s), initial encounter: Secondary | ICD-10-CM

## 2012-09-08 DIAGNOSIS — IMO0002 Reserved for concepts with insufficient information to code with codable children: Secondary | ICD-10-CM

## 2012-09-08 NOTE — ED Notes (Signed)
Pt  Reports     Symptoms  That  He  Struck   His  l  Middle  Toe on a  Splinter      From the  [employer]  [employer]  [place]       He  Thinks  He  May have  A   FB in the  Toe       No  Swelling

## 2012-09-08 NOTE — ED Provider Notes (Signed)
History     CSN: [id number]  Arrival date & time 09/08/12  4098   First MD Initiated Contact with Patient 09/08/12 [phone]      Chief Complaint  Patient presents with  . Toe Injury    (Consider location/radiation/quality/duration/timing/severity/associated sxs/prior treatment) Patient is a 61 y.o. male presenting with toe pain. The history is provided by the patient.  Toe Pain This is a new problem. The current episode started 6 to 12 hours ago (struck toe against wood box during night, sore and pt concerned of possible fb., no bleeding.). The problem has not changed since onset.Nothing aggravates the symptoms. Nothing relieves the symptoms.    Past Medical History  Diagnosis Date  . Arrhythmia   . Pre-diabetes   . High cholesterol     Past Surgical History  Procedure Date  . Prostate surgery     No family history on file.  History  Substance Use Topics  . Smoking status: Never Smoker   . Smokeless tobacco: Not on file  . Alcohol Use: No      Review of Systems  Constitutional: Negative.   Musculoskeletal: Negative for myalgias, joint swelling and gait problem.    Allergies  Review of patient's allergies indicates no known allergies.  Home Medications   Current Outpatient Rx  Name  Route  Sig  Dispense  Refill  . ALBUTEROL SULFATE HFA 108 (90 BASE) MCG/ACT IN AERS   Inhalation   Inhale 1-2 puffs into the lungs every 6 (six) hours as needed for wheezing.   1 Inhaler   0   . ASPIRIN 325 MG PO TABS   Oral   Take 325 mg by mouth daily.         Marland Kitchen PRAVASTATIN SODIUM 20 MG PO TABS   Oral   Take 20 mg by mouth daily.         . TELMISARTAN 80 MG PO TABS   Oral   Take 80 mg by mouth daily.         Marland Kitchen VERAPAMIL HCL ER 120 MG PO TBCR   Oral   Take 120 mg by mouth at bedtime.           BP 154/73  Pulse 50  Temp 97.9 F (36.6 C) (Oral)  Resp 18  SpO2 98%  Physical Exam  Nursing note and vitals reviewed. Constitutional: He is oriented to  person, place, and time. He appears well-developed and well-nourished.  Musculoskeletal: He exhibits tenderness.       V superficial abrasion to dorsum of left 3rd toe. No puncture wound seen, no bleeding, nontender,   Neurological: He is alert and oriented to person, place, and time.  Skin: Skin is warm and dry.    ED Course  Procedures (including critical care time)  Labs Reviewed - No data to display No results found.   No diagnosis found.    MDM  Betadine soak, dsd.        Linna Hoff, MD 09/08/12 [phone]

## 2013-12-09 ENCOUNTER — Encounter (HOSPITAL_COMMUNITY): Payer: Self-pay | Admitting: Emergency Medicine

## 2013-12-09 ENCOUNTER — Emergency Department (HOSPITAL_COMMUNITY)
Admission: EM | Admit: 2013-12-09 | Discharge: 2013-12-09 | Disposition: A | Discharge status: Home / Self Care | Payer: Medicare Other | Source: Home / Self Care | Attending: Family Medicine | Admitting: Family Medicine

## 2013-12-09 DIAGNOSIS — J069 Acute upper respiratory infection, unspecified: Secondary | ICD-10-CM

## 2013-12-09 MED ORDER — AZITHROMYCIN 250 MG PO TABS: 250.0000 mg | ORAL_TABLET | Freq: Every day | ORAL | Status: DC

## 2013-12-09 MED ORDER — PREDNISONE 10 MG PO TABS: 30.0000 mg | ORAL_TABLET | Freq: Every day | ORAL | Status: DC

## 2013-12-09 MED ORDER — IPRATROPIUM BROMIDE 0.06 % NA SOLN: 2.0000 | Freq: Four times a day (QID) | NASAL | Status: DC

## 2013-12-09 MED ORDER — GUAIFENESIN-CODEINE 100-10 MG/5ML PO SOLN: 5.0000 mL | Freq: Every evening | ORAL | Status: DC | PRN

## 2013-12-09 NOTE — ED Notes (Signed)
C/o   Started off with a scratchy throat.  Sinus and chest congestion.  "hear rattling in chest at night".  Productive cough with yellow/bloody sputum.   Post nasal drip.   Denies fever,  N/v/d.   Symptoms present x 2 wks.  Mild relief with otc allergy meds.

## 2013-12-09 NOTE — ED Provider Notes (Signed)
Larose KellsJames L Hudler is a 63 y.o. male who presents to Urgent Care today for congestion cough and nasal discharge for about 2 weeks. No fevers or chills nausea vomiting or diarrhea. No significant shortness of breath. Patient has tried some over-the-counter cold medications which have helped. Patient notes he typically has bradycardia heart rate.    Past Medical History  Diagnosis Date  . Arrhythmia   . Pre-diabetes   . High cholesterol    History  Substance Use Topics  . Smoking status: Never Smoker   . Smokeless tobacco: Not on file  . Alcohol Use: No   ROS as above Medications: No current facility-administered medications for this encounter.   Current Outpatient Prescriptions  Medication Sig Dispense Refill  . aspirin 325 MG tablet Take 325 mg by mouth daily.      . pravastatin (PRAVACHOL) 20 MG tablet Take 20 mg by mouth daily.      Marland Kitchen. telmisartan (MICARDIS) 80 MG tablet Take 80 mg by mouth daily.      . verapamil (CALAN-SR) 120 MG CR tablet Take 120 mg by mouth at bedtime.      Marland Kitchen. albuterol (PROVENTIL HFA;VENTOLIN HFA) 108 (90 BASE) MCG/ACT inhaler Inhale 1-2 puffs into the lungs every 6 (six) hours as needed for wheezing.  1 Inhaler  0  . azithromycin (ZITHROMAX) 250 MG tablet Take 1 tablet (250 mg total) by mouth daily. Take first 2 tablets together, then 1 every day until finished.  6 tablet  0  . guaiFENesin-codeine 100-10 MG/5ML syrup Take 5 mLs by mouth at bedtime as needed for cough.  120 mL  0  . ipratropium (ATROVENT) 0.06 % nasal spray Place 2 sprays into both nostrils 4 (four) times daily.  15 mL  1  . predniSONE (DELTASONE) 10 MG tablet Take 3 tablets (30 mg total) by mouth daily.  15 tablet  0    Exam:  BP 148/50  Pulse 47  Temp(Src) 97.7 F (36.5 C) (Oral)  Resp 16  SpO2 98% Gen: Well NAD HEENT: EOMI,  MMM clear nasal discharge. Normal tympanic membranes bilaterally. Posterior pharynx with cobblestoning. Lungs: Normal work of breathing. CTABL Heart: Bradycardia  but regular no MRG Abd: NABS, Soft. NT, ND Exts: Brisk capillary refill, warm and well perfused.    Assessment and Plan: 63 y.o. male with bronchitis. Plan to treat with low dose and a short duration prednisone as well as Atrovent nasal spray including containing cough medication. We'll provide azithromycin for use if patient does not improve. Bradycardia is normal for patient. He is asymptomatic from the standpoint.  Discussed warning signs or symptoms. Please see discharge instructions. Patient expresses understanding.    Rodolph BongEvan S Shahla Betsill, MD 12/09/13 [id number]

## 2013-12-09 NOTE — Discharge Instructions (Signed)
Thank you for coming in today. Use prednisone, atrovent and codeine as needed.  Take azithromycin if not getting better.  Call or go to the emergency room if you get worse, have trouble breathing, have chest pains, or palpitations.   Bronchitis Bronchitis is inflammation of the airways that extend from the windpipe into the lungs (bronchi). The inflammation often causes mucus to develop, which leads to a cough. If the inflammation becomes severe, it may cause shortness of breath. CAUSES  Bronchitis may be caused by:   Viral infections.   Bacteria.   Cigarette smoke.   Allergens, pollutants, and other irritants.  SIGNS AND SYMPTOMS  The most common symptom of bronchitis is a frequent cough that produces mucus. Other symptoms include:  Fever.   Body aches.   Chest congestion.   Chills.   Shortness of breath.   Sore throat.  DIAGNOSIS  Bronchitis is usually diagnosed through a medical history and physical exam. Tests, such as chest X-rays, are sometimes done to rule out other conditions.  TREATMENT  You may need to avoid contact with whatever caused the problem (smoking, for example). Medicines are sometimes needed. These may include:  Antibiotics. These may be prescribed if the condition is caused by bacteria.  Cough suppressants. These may be prescribed for relief of cough symptoms.   Inhaled medicines. These may be prescribed to help open your airways and make it easier for you to breathe.   Steroid medicines. These may be prescribed for those with recurrent (chronic) bronchitis. HOME CARE INSTRUCTIONS  Get plenty of rest.   Drink enough fluids to keep your urine clear or pale yellow (unless you have a medical condition that requires fluid restriction). Increasing fluids may help thin your secretions and will prevent dehydration.   Only take over-the-counter or prescription medicines as directed by your health care provider.  Only take antibiotics as  directed. Make sure you finish them even if you start to feel better.  Avoid secondhand smoke, irritating chemicals, and strong fumes. These will make bronchitis worse. If you are a smoker, quit smoking. Consider using nicotine gum or skin patches to help control withdrawal symptoms. Quitting smoking will help your lungs heal faster.   Put a cool-mist humidifier in your bedroom at night to moisten the air. This may help loosen mucus. Change the water in the humidifier daily. You can also run the hot water in your shower and sit in the bathroom with the door closed for 5 10 minutes.   Follow up with your health care provider as directed.   Wash your hands frequently to avoid catching bronchitis again or spreading an infection to others.  SEEK MEDICAL CARE IF: Your symptoms do not improve after 1 week of treatment.  SEEK IMMEDIATE MEDICAL CARE IF:  Your fever increases.  You have chills.   You have chest pain.   You have worsening shortness of breath.   You have bloody sputum.  You faint.  You have lightheadedness.  You have a severe headache.   You vomit repeatedly. MAKE SURE YOU:   Understand these instructions.  Will watch your condition.  Will get help right away if you are not doing well or get worse. Document Released: 09/23/2005 Document Revised: 07/14/2013 Document Reviewed: 05/18/2013 [facility] Patient Information 2014 [employer] [place] [employer], [place].

## 2014-07-04 ENCOUNTER — Encounter (HOSPITAL_COMMUNITY): Payer: Self-pay | Admitting: Emergency Medicine

## 2014-07-04 ENCOUNTER — Emergency Department (HOSPITAL_COMMUNITY)
Admission: EM | Admit: 2014-07-04 | Discharge: 2014-07-04 | Disposition: A | Discharge status: Home / Self Care | Payer: Medicare Other | Source: Home / Self Care | Attending: Family Medicine | Admitting: Family Medicine

## 2014-07-04 DIAGNOSIS — W2203XA Walked into furniture, initial encounter: Secondary | ICD-10-CM

## 2014-07-04 DIAGNOSIS — I10 Essential (primary) hypertension: Secondary | ICD-10-CM

## 2014-07-04 DIAGNOSIS — T148XXA Other injury of unspecified body region, initial encounter: Secondary | ICD-10-CM

## 2014-07-04 MED ORDER — BACITRACIN ZINC 500 UNIT/GM EX OINT
TOPICAL_OINTMENT | CUTANEOUS | Status: AC
  Filled 2014-07-04: qty 0.9

## 2014-07-04 NOTE — ED Notes (Signed)
Dressed pt's heel wound with Bacitracin and a dry dressing.  Wound was clean and dry.

## 2014-07-04 NOTE — ED Notes (Signed)
Pt hit his foot on the wood part of his couch 4 days ago.  Pt developed a blood blister on his heel that has not gone away.  Pt denies any pain.

## 2014-07-04 NOTE — ED Provider Notes (Signed)
CSN: [id number]     Arrival date & time 07/04/14  0801 History   First MD Initiated Contact with Patient 07/04/14 [phone]     Chief Complaint  Patient presents with  . Foot Injury    4 days ago   (Consider location/radiation/quality/duration/timing/severity/associated sxs/prior Treatment) HPI  4 days ago hit heal on the hard part of couch. Developed red spot. Initially painful but no longer. Denies any discharge. Red spot has not gone away. Pt id a diabetic and is worriewd that it might be a problem since it hasn't gone away yet. Denies, fevers, foot pain, difficulty amubating.   HTN/Arrhythia: Did not take meds thi morning. Denies CP, SOB, Palpitations.     Past Medical History  Diagnosis Date  . Arrhythmia   . Pre-diabetes   . High cholesterol    Past Surgical History  Procedure Laterality Date  . Prostate surgery     History reviewed. No pertinent family history. History  Substance Use Topics  . Smoking status: Never Smoker   . Smokeless tobacco: Not on file  . Alcohol Use: No    Review of Systems Per HPI with all other pertinent systems negative.   Allergies  Review of patient's allergies indicates no known allergies.  Home Medications   Prior to Admission medications   Medication Sig Start Date End Date Taking? Authorizing Provider  telmisartan (MICARDIS) 80 MG tablet Take 80 mg by mouth daily.   Yes Historical Provider, MD  verapamil (CALAN-SR) 120 MG CR tablet Take 120 mg by mouth at bedtime.   Yes Historical Provider, MD  albuterol (PROVENTIL HFA;VENTOLIN HFA) 108 (90 BASE) MCG/ACT inhaler Inhale 1-2 puffs into the lungs every 6 (six) hours as needed for wheezing. 01/07/12 01/06/13  Reuben Likes, MD  aspirin 325 MG tablet Take 325 mg by mouth daily.    Historical Provider, MD  azithromycin (ZITHROMAX) 250 MG tablet Take 1 tablet (250 mg total) by mouth daily. Take first 2 tablets together, then 1 every day until finished. 12/09/13   Rodolph Bong, MD   guaiFENesin-codeine 100-10 MG/5ML syrup Take 5 mLs by mouth at bedtime as needed for cough. 12/09/13   Rodolph Bong, MD  ipratropium (ATROVENT) 0.06 % nasal spray Place 2 sprays into both nostrils 4 (four) times daily. 12/09/13   Rodolph Bong, MD  pravastatin (PRAVACHOL) 20 MG tablet Take 20 mg by mouth daily.    Historical Provider, MD  predniSONE (DELTASONE) 10 MG tablet Take 3 tablets (30 mg total) by mouth daily. 12/09/13   Rodolph Bong, MD   BP 157/82  Pulse 50  Temp(Src) 98.4 F (36.9 C) (Oral)  Resp 16  SpO2 97% Physical Exam  Constitutional: He is oriented to person, place, and time. He appears well-developed and well-nourished.  HENT:  Head: Normocephalic and atraumatic.  Eyes: EOM are normal. Pupils are equal, round, and reactive to light.  Neck: Normal range of motion.  Cardiovascular: Normal rate, normal heart sounds and intact distal pulses.   No murmur heard. Pulmonary/Chest: Effort normal and breath sounds normal.  Abdominal: Soft. Bowel sounds are normal.  Musculoskeletal: Normal range of motion. He exhibits no edema and no tenderness.  Neurological: He is alert and oriented to person, place, and time.  Skin:  L heel w/ 1x1cm dark slightly raised hematoma. Non-ttp. 18 gauge sterile needle used to cut open w/ x pattern. Medial quarter removed to allow it to be open. Antibiotic ointment and sterile gauze applied.   Psychiatric:  He has a normal mood and affect. His behavior is normal. Judgment and thought content normal.    ED Course  Procedures (including critical care time) Labs Review Labs Reviewed - No data to display  Imaging Review No results found.   MDM   1. Essential hypertension   2. Blood blister    Pt to go home and take BP meds Blood blister opened as above.  No signs of infection or deep tissue or bony injury Wound care instructions given Precautions given and all questions answered  Shelly Flatten, MD Family Medicine 07/04/2014, 8:45  AM      Ozella Rocks, MD 07/04/14 [phone]

## 2014-07-04 NOTE — Discharge Instructions (Signed)
The trauma you caused to your heel is not permenant. The blood blister was opened and there is no sign of infection. Please place antibiotic ointment on the wound until it has a chance to heal over. Please come back if you develop any pain at the site.  Please remember to take your blood pressure medication.

## 2014-08-30 ENCOUNTER — Other Ambulatory Visit: Payer: Self-pay | Admitting: Family Medicine

## 2014-08-30 DIAGNOSIS — Z0001 Encounter for general adult medical examination with abnormal findings: Secondary | ICD-10-CM

## 2014-09-09 ENCOUNTER — Encounter (INDEPENDENT_AMBULATORY_CARE_PROVIDER_SITE_OTHER): Payer: Self-pay

## 2014-09-09 ENCOUNTER — Ambulatory Visit
Admission: RE | Admit: 2014-09-09 | Discharge: 2014-09-09 | Disposition: A | Discharge status: Home / Self Care | Payer: Medicare Other | Source: Ambulatory Visit | Attending: Family Medicine | Admitting: Family Medicine

## 2014-09-09 DIAGNOSIS — Z0001 Encounter for general adult medical examination with abnormal findings: Secondary | ICD-10-CM

## 2014-11-03 ENCOUNTER — Emergency Department (HOSPITAL_COMMUNITY)
Admission: EM | Admit: 2014-11-03 | Discharge: 2014-11-03 | Disposition: A | Discharge status: Home / Self Care | Payer: Medicare Other | Source: Home / Self Care | Attending: Family Medicine | Admitting: Family Medicine

## 2014-11-03 ENCOUNTER — Encounter (HOSPITAL_COMMUNITY): Payer: Self-pay | Admitting: Emergency Medicine

## 2014-11-03 DIAGNOSIS — L0291 Cutaneous abscess, unspecified: Secondary | ICD-10-CM

## 2014-11-03 DIAGNOSIS — L739 Follicular disorder, unspecified: Secondary | ICD-10-CM

## 2014-11-03 MED ORDER — IBUPROFEN 800 MG PO TABS
800.0000 mg | ORAL_TABLET | Freq: Once | ORAL | Status: AC
  Administered 2014-11-03: 800 mg via ORAL

## 2014-11-03 MED ORDER — IBUPROFEN 800 MG PO TABS
ORAL_TABLET | ORAL | Status: AC
  Filled 2014-11-03: qty 1

## 2014-11-03 MED ORDER — CEPHALEXIN 500 MG PO CAPS: 500.0000 mg | ORAL_CAPSULE | Freq: Four times a day (QID) | ORAL | Status: DC

## 2014-11-03 NOTE — Discharge Instructions (Signed)
You had an abscess that was drained in our clinic today. Please make sure to use antibacterial soap to wash her head daily to help prevent further folliculitis.Marland Kitchen. Please start the antibiotic to fully clear the infection. Please use ibuprofen 600 mg every 6 hours as needed for pain.

## 2014-11-03 NOTE — ED Notes (Signed)
Pt has a small, hard, moveable knot above his left ear that has been there for about 3-4 days.  It is causing some minor irritation/pain.

## 2014-11-03 NOTE — ED Provider Notes (Signed)
CSN: [id number]     Arrival date & time 11/03/14  1129 History   First MD Initiated Contact with Patient 11/03/14 1200     Chief Complaint  Patient presents with  . Cyst   (Consider location/radiation/quality/duration/timing/severity/associated sxs/prior Treatment) HPI  R head cyst. Present for 3-4 days. Getting bigger. Only sore to palpation. No discharge. No fevers. Tried hot compress which made it worse. Cyst is constant. Non-radiating.   Past Medical History  Diagnosis Date  . Arrhythmia   . Pre-diabetes   . High cholesterol    Past Surgical History  Procedure Laterality Date  . Prostate surgery     Family History  Problem Relation Age of Onset  . Diabetes Mother   . Diabetes Father    History  Substance Use Topics  . Smoking status: Never Smoker   . Smokeless tobacco: Not on file  . Alcohol Use: No    Review of Systems Per HPI with all other pertinent systems negative.   Allergies  Review of patient's allergies indicates no known allergies.  Home Medications   Prior to Admission medications   Medication Sig Start Date End Date Taking? Authorizing Provider  diazepam (VALIUM) 5 MG tablet Take 5 mg by mouth every 6 (six) hours as needed for anxiety.   Yes Historical Provider, MD  telmisartan (MICARDIS) 80 MG tablet Take 80 mg by mouth daily.   Yes Historical Provider, MD  verapamil (CALAN-SR) 120 MG CR tablet Take 120 mg by mouth at bedtime.   Yes Historical Provider, MD  albuterol (PROVENTIL HFA;VENTOLIN HFA) 108 (90 BASE) MCG/ACT inhaler Inhale 1-2 puffs into the lungs every 6 (six) hours as needed for wheezing. 01/07/12 01/06/13  Reuben Likes, MD  aspirin 325 MG tablet Take 325 mg by mouth daily.    Historical Provider, MD  azithromycin (ZITHROMAX) 250 MG tablet Take 1 tablet (250 mg total) by mouth daily. Take first 2 tablets together, then 1 every day until finished. 12/09/13   Rodolph Bong, MD  cephALEXin (KEFLEX) 500 MG capsule Take 1 capsule (500 mg total) by  mouth 4 (four) times daily. 11/03/14   Ozella Rocks, MD  guaiFENesin-codeine 100-10 MG/5ML syrup Take 5 mLs by mouth at bedtime as needed for cough. 12/09/13   Rodolph Bong, MD  ipratropium (ATROVENT) 0.06 % nasal spray Place 2 sprays into both nostrils 4 (four) times daily. 12/09/13   Rodolph Bong, MD  pravastatin (PRAVACHOL) 20 MG tablet Take 20 mg by mouth daily.    Historical Provider, MD  predniSONE (DELTASONE) 10 MG tablet Take 3 tablets (30 mg total) by mouth daily. 12/09/13   Rodolph Bong, MD   BP 147/68 mmHg  Pulse 60  Temp(Src) 98.1 F (36.7 C)  Resp 12  SpO2 100% Physical Exam  Constitutional: He is oriented to person, place, and time. He appears well-developed and well-nourished.  HENT:  Head: Atraumatic.  Eyes: EOM are normal. Pupils are equal, round, and reactive to light.  Neck: Normal range of motion.  Cardiovascular: Normal rate.   Pulmonary/Chest: Effort normal and breath sounds normal.  Abdominal: Soft. Bowel sounds are normal.  Musculoskeletal: Normal range of motion.  Neurological: He is alert and oriented to person, place, and time.  Skin: Skin is warm.  3-4 areas of healing dry skin around follicles with right scalp superior auricular skin follicle with central induration and fluctuance. Tender to palpation.  Psychiatric: He has a normal mood and affect. His behavior is normal. Judgment  and thought content normal.    ED Course  INCISION AND DRAINAGE Date/Time: 11/03/2014 12:27 PM Performed by: Konrad DoloresMERRELL, DAVID J Authorized by: Konrad DoloresMERRELL, DAVID J Consent: Verbal consent obtained. Consent given by: patient Patient identity confirmed: verbally with patient Type: abscess Body area: head/neck Location details: scalp Local anesthetic: ethyl chloride spray. Scalpel size: 11 Incision type: single straight Complexity: simple Drainage: purulent Drainage amount: moderate Wound treatment: wound left open   (including critical care time) Labs Review Labs Reviewed   CULTURE, ROUTINE-ABSCESS    Imaging Review  MDM   1. Abscess   2. Folliculitis     Abscess drained as above. Recurring folliculitis. Start Keflex for 7 days. Using an antibacterial soap on the scalp. Wound culture sent.  Ozella Rocksavid J Merrell, MD 11/03/14 1228

## 2014-11-06 LAB — CULTURE, ROUTINE-ABSCESS

## 2015-06-14 ENCOUNTER — Encounter: Payer: Self-pay | Admitting: Neurology

## 2015-06-14 ENCOUNTER — Ambulatory Visit (INDEPENDENT_AMBULATORY_CARE_PROVIDER_SITE_OTHER): Payer: Medicare Other | Admitting: Neurology

## 2015-06-14 VITALS — BP 104/62 | HR 58 | Resp 16 | Ht 72.0 in | Wt 227.0 lb

## 2015-06-14 DIAGNOSIS — E119 Type 2 diabetes mellitus without complications: Secondary | ICD-10-CM

## 2015-06-14 DIAGNOSIS — E785 Hyperlipidemia, unspecified: Secondary | ICD-10-CM

## 2015-06-14 DIAGNOSIS — R413 Other amnesia: Secondary | ICD-10-CM

## 2015-06-14 DIAGNOSIS — I10 Essential (primary) hypertension: Secondary | ICD-10-CM

## 2015-06-14 HISTORY — DX: Essential (primary) hypertension: I10

## 2015-06-14 HISTORY — DX: Hyperlipidemia, unspecified: E78.5

## 2015-06-14 HISTORY — DX: Other amnesia: R41.3

## 2015-06-14 HISTORY — DX: Type 2 diabetes mellitus without complications: E11.9

## 2015-06-14 LAB — TSH: TSH: 2.464 u[IU]/mL (ref 0.350–4.500)

## 2015-06-14 LAB — VITAMIN B12: Vitamin B-12: 728 pg/mL (ref 211–911)

## 2015-06-14 MED ORDER — DONEPEZIL HCL 10 MG PO TABS
ORAL_TABLET | ORAL | Status: DC
Start: 2015-06-14 — End: 2020-03-08

## 2015-06-14 NOTE — Patient Instructions (Addendum)
1. Increase Aricept : take 2 tablets daily. Once you are done with your bottle, your new prescription will be for Aricept  take 1 tablet daily 2. Bloodwork for TSH and B12 3. Physical exercise and brain stimulation exercises (crossword puzzles, word search, etc) are important for brain health 4. Follow-up in 6 months with your wife, call for any changes

## 2015-06-14 NOTE — Progress Notes (Signed)
NEUROLOGY CONSULTATION NOTE  Joseph Lamb MRN: [id number] DOB: 04/22/1951  Referring provider: Dr. Lupita Raider Primary care provider: Dr. Lupita Raider  Reason for consult:  Memory loss  Dear Dr Joseph Lamb:  Thank you for your kind referral of Joseph Lamb for consultation of the above symptoms. Although his history is well known to you, please allow me to reiterate it for the purpose of our medical record. He presents today without any family to provide additional information. Records and images were personally reviewed where available.  HISTORY OF PRESENT ILLNESS: This is a pleasant 64 year old right-handed man with a history of hypertension, diabetes, hyperlipidemia, presenting for evaluation of worsening memory. His wife reported that memory is worse and would like this re-addressed. She is not present during today's visit. Mr. Joseph Lamb himself feels that his memory is okay. His wife has told him that he forgets conversations and repeats himself. He has forgotten something on a stove a few times. He has occasional word-finding difficulties. He denies getting lost driving. He denies any missed medications and states he does not need reminders to take them. His wife has always been in charge of bill payments. He lives with his wife, his son occasionally visits and has not mentioned any concern for memory to him. He retired 3-4 years ago, and worked part-time for a few months until recently. He denies any cognitive difficulties or being written up at work. He denies any family history of dementia, no history of significant head injuries or alcohol use.  He was evaluated by neurologist Dr. Marjory Lamb in 2013, at that time wife reported mild memory loss starting around 2010 or 2011. He had an MMSE in August 2012 of 29/30, then [facility] score of 28/30 in October 2013. He had an MRI brain in 05/2011 which I personally reviewed showing mild diffuse volume loss, mild to moderate chronic  microvascular disease, and a small area of cortical encephalomalacia in the right perirolandic cortex. He has been taking Aricept  daily since 2012 with no side effects. He has occasional blurred vision and numbness in his left hand at night that resolves when he shakes it. Otherwise he denies any headaches, dizziness, diplopia, dysarthria, dysphagia, neck/back pain, focal weakness, bowel/bladder dysfunction, anosmia, or tremors.   PAST MEDICAL HISTORY: Past Medical History  Diagnosis Date  . Arrhythmia   . Pre-diabetes   . High cholesterol     PAST SURGICAL HISTORY: Past Surgical History  Procedure Laterality Date  . Prostate surgery      MEDICATIONS: Current Outpatient Prescriptions on File Prior to Visit  Medication Sig Dispense Refill  . aspirin 325 MG tablet Take 325 mg by mouth daily.    Marland Kitchen telmisartan (MICARDIS) 80 MG tablet Take 80 mg by mouth daily.    . verapamil (CALAN-SR) 120 MG CR tablet Take 120 mg by mouth at bedtime.    Marland Kitchen albuterol (PROVENTIL HFA;VENTOLIN HFA) 108 (90 BASE) MCG/ACT inhaler Inhale 1-2 puffs into the lungs every 6 (six) hours as needed for wheezing. 1 Inhaler 0   No current facility-administered medications on file prior to visit.    ALLERGIES: No Known Allergies  FAMILY HISTORY: Family History  Problem Relation Age of Onset  . Diabetes Mother   . Diabetes Father     SOCIAL HISTORY: Social History   Social History  . Marital Status: Married    Spouse Name: N/A  . Number of Children: 2  . Years of Education: N/A   Occupational History  .  Retired    Social History Main Topics  . Smoking status: Never Smoker   . Smokeless tobacco: Never Used  . Alcohol Use: 0.0 oz/week    0 Standard drinks or equivalent per week     Comment: rare  . Drug Use: No  . Sexual Activity: Not on file   Other Topics Concern  . Not on file   Social History Narrative    REVIEW OF SYSTEMS: Constitutional: No fevers, chills, or sweats, no generalized  fatigue, change in appetite Eyes: No visual changes, double vision, eye pain Ear, nose and throat: No hearing loss, ear pain, nasal congestion, sore throat Cardiovascular: No chest pain, palpitations Respiratory:  No shortness of breath at rest or with exertion, wheezes GastrointestinaI: No nausea, vomiting, diarrhea, abdominal pain, fecal incontinence Genitourinary:  No dysuria, urinary retention or frequency Musculoskeletal:  No neck pain, back pain Integumentary: No rash, pruritus, skin lesions Neurological: as above Psychiatric: No depression, insomnia, anxiety Endocrine: No palpitations, fatigue, diaphoresis, mood swings, change in appetite, change in weight, increased thirst Hematologic/Lymphatic:  No anemia, purpura, petechiae. Allergic/Immunologic: no itchy/runny eyes, nasal congestion, recent allergic reactions, rashes  PHYSICAL EXAM: Filed Vitals:   06/14/15 1246  BP: 104/62  Pulse: 58  Resp: 16   General: No acute distress Head:  Normocephalic/atraumatic Eyes: Fundoscopic exam shows bilateral sharp discs, no vessel changes, exudates, or hemorrhages Neck: supple, no paraspinal tenderness, full range of motion Back: No paraspinal tenderness Heart: regular rate and rhythm Lungs: Clear to auscultation bilaterally. Vascular: No carotid bruits. Skin/Extremities: No rash, no edema Neurological Exam: Mental status: alert and oriented to person, place, and time, no dysarthria or aphasia, Fund of knowledge is appropriate.  Recent and remote memory are intact.  Attention and concentration are normal.    Able to name objects and repeat phrases.  Montreal Cognitive Assessment  06/14/2015  Visuospatial/ Executive (0/5) 5  Naming (0/3) 3  Attention: Read list of digits (0/2) 2  Attention: Read list of letters (0/1) 1  Attention: Serial 7 subtraction starting at 100 (0/3) 3  Language: Repeat phrase (0/2) 2  Language : Fluency (0/1) 1  Abstraction (0/2) 2  Delayed Recall (0/5) 4    Orientation (0/6) 6  Total 29  Adjusted Score (based on education) 29   Cranial nerves: CN I: not tested CN II: pupils equal, round and reactive to light, visual fields intact, fundi unremarkable. CN III, IV, VI:  full range of motion, no nystagmus, no ptosis CN V: facial sensation intact CN VII: upper and lower face symmetric CN VIII: hearing intact to finger rub CN IX, X: gag intact, uvula midline CN XI: sternocleidomastoid and trapezius muscles intact CN XII: tongue midline Bulk & Tone: normal, no fasciculations. Motor: 5/5 throughout with no pronator drift. Sensation: intact to light touch, cold, pin, vibration and joint position sense.  No extinction to double simultaneous stimulation.  Romberg test negative Deep Tendon Reflexes: +1 throughout except for absent ankle jerks bilaterally, no ankle clonus Plantar responses: downgoing bilaterally Cerebellar: no incoordination on finger to nose, heel to shin. No dysdiadochokinesia Gait: narrow-based and steady, able to tandem walk adequately. Tremor: none  IMPRESSION: This is a pleasant 64 year old right-handed man with a history of hypertension, hyperlipidemia, diabetes, presenting for evaluation of worsening memory. His MOCA score today is 29/30. The patient himself feels memory is okay, his wife has expressed concerns about worsening but is not present in the visit today. His MOCA score in 2013 was 28/30, no significant  change. MRI in 2012 for memory loss showed mild to moderate chronic microvascular disease. Neurological exam today non-focal, would hold off on repeat imaging unless any significant changes noted. Memory normal on testing today, however with concerns for worsening memory, he may benefit from increasing dose of Aricept to 10mg  daily. Side effects were discussed. Check TSH and B12 for treatable causes of memory loss. He reports mood is good. We discussed the importance of control of vascular risk factors, physical exercise,  and brain stimulation exercises for brain health. He will follow-up in 6 months and was advised to have his wife come on the next visit.   Thank you for allowing me to participate in the care of this patient. Please do not hesitate to call for any questions or concerns.   Patrcia Dolly, M.D.  CC: Dr. Clelia Lamb

## 2015-06-15 ENCOUNTER — Telehealth: Payer: Self-pay | Admitting: Family Medicine

## 2015-06-15 NOTE — Telephone Encounter (Signed)
Lmovm to return my call. 

## 2015-06-15 NOTE — Telephone Encounter (Signed)
-----   Message from Van Clines, MD sent at 06/15/2015 12:20 PM EDT ----- Pls let him know thyroid and B12 levels are normal. Thanks

## 2015-06-19 NOTE — Telephone Encounter (Signed)
I called patient again and I was able to speak with him. I notified him of results.

## 2015-12-12 ENCOUNTER — Ambulatory Visit: Payer: Medicare Other | Admitting: Neurology

## 2015-12-12 DIAGNOSIS — Z029 Encounter for administrative examinations, unspecified: Secondary | ICD-10-CM

## 2016-03-29 ENCOUNTER — Other Ambulatory Visit: Payer: Self-pay | Admitting: Family Medicine

## 2016-03-29 DIAGNOSIS — E349 Endocrine disorder, unspecified: Secondary | ICD-10-CM

## 2016-04-02 ENCOUNTER — Ambulatory Visit
Admission: RE | Admit: 2016-04-02 | Discharge: 2016-04-02 | Disposition: A | Discharge status: Home / Self Care | Payer: Medicare Other | Source: Ambulatory Visit | Attending: Family Medicine | Admitting: Family Medicine

## 2016-04-02 DIAGNOSIS — E349 Endocrine disorder, unspecified: Secondary | ICD-10-CM

## 2016-04-06 ENCOUNTER — Emergency Department (HOSPITAL_COMMUNITY)
Admission: EM | Admit: 2016-04-06 | Discharge: 2016-04-06 | Disposition: A | Discharge status: Home / Self Care | Payer: Medicare Other | Attending: Emergency Medicine | Admitting: Emergency Medicine

## 2016-04-06 ENCOUNTER — Encounter (HOSPITAL_COMMUNITY): Payer: Self-pay | Admitting: *Deleted

## 2016-04-06 ENCOUNTER — Emergency Department (HOSPITAL_COMMUNITY): Payer: Medicare Other

## 2016-04-06 DIAGNOSIS — I10 Essential (primary) hypertension: Secondary | ICD-10-CM | POA: Diagnosis not present

## 2016-04-06 DIAGNOSIS — K5900 Constipation, unspecified: Secondary | ICD-10-CM | POA: Diagnosis not present

## 2016-04-06 DIAGNOSIS — Z79899 Other long term (current) drug therapy: Secondary | ICD-10-CM | POA: Diagnosis not present

## 2016-04-06 DIAGNOSIS — R1084 Generalized abdominal pain: Secondary | ICD-10-CM | POA: Diagnosis present

## 2016-04-06 DIAGNOSIS — R109 Unspecified abdominal pain: Secondary | ICD-10-CM

## 2016-04-06 HISTORY — DX: Essential (primary) hypertension: I10

## 2016-04-06 LAB — COMPREHENSIVE METABOLIC PANEL
ALBUMIN: 3.8 g/dL (ref 3.5–5.0)
ALK PHOS: 106 U/L (ref 38–126)
ALT: 17 U/L (ref 17–63)
ANION GAP: 8 (ref 5–15)
AST: 17 U/L (ref 15–41)
BILIRUBIN TOTAL: 0.6 mg/dL (ref 0.3–1.2)
BUN: 10 mg/dL (ref 6–20)
CALCIUM: 9.6 mg/dL (ref 8.9–10.3)
CO2: 26 mmol/L (ref 22–32)
CREATININE: 1.28 mg/dL — AB (ref 0.61–1.24)
Chloride: 105 mmol/L (ref 101–111)
GFR calc Af Amer: >60 mL/min (ref >60)
GFR calc non Af Amer: 56 mL/min — ABNORMAL LOW (ref >60)
GLUCOSE: 148 mg/dL — AB (ref 65–99)
Potassium: 3.9 mmol/L (ref 3.5–5.1)
SODIUM: 139 mmol/L (ref 135–145)
TOTAL PROTEIN: 7.5 g/dL (ref 6.5–8.1)

## 2016-04-06 LAB — URINALYSIS, ROUTINE W REFLEX MICROSCOPIC
BILIRUBIN URINE: NEGATIVE
Glucose, UA: NEGATIVE mg/dL
HGB URINE DIPSTICK: NEGATIVE
Ketones, ur: NEGATIVE mg/dL
Leukocytes, UA: NEGATIVE
NITRITE: NEGATIVE
PH: 5 (ref 5.0–8.0)
Protein, ur: NEGATIVE mg/dL
SPECIFIC GRAVITY, URINE: 1.019 (ref 1.005–1.030)

## 2016-04-06 LAB — LIPASE, BLOOD: Lipase: 25 U/L (ref 11–51)

## 2016-04-06 LAB — CBC
HCT: 48.8 % (ref 39.0–52.0)
Hemoglobin: 16 g/dL (ref 13.0–17.0)
MCH: 27.6 pg (ref 26.0–34.0)
MCHC: 32.8 g/dL (ref 30.0–36.0)
MCV: 84.3 fL (ref 78.0–100.0)
PLATELETS: 229 10*3/uL (ref 150–400)
RBC: 5.79 MIL/uL (ref 4.22–5.81)
RDW: 14.7 % (ref 11.5–15.5)
WBC: 9.1 10*3/uL (ref 4.0–10.5)

## 2016-04-06 MED ORDER — GI COCKTAIL ~~LOC~~
30.0000 mL | Freq: Once | ORAL | Status: AC
  Administered 2016-04-06: 30 mL via ORAL
  Filled 2016-04-06: qty 30

## 2016-04-06 MED ORDER — DICYCLOMINE HCL 10 MG/ML IM SOLN
20.0000 mg | Freq: Once | INTRAMUSCULAR | Status: AC
  Administered 2016-04-06: 20 mg via INTRAMUSCULAR
  Filled 2016-04-06: qty 2

## 2016-04-06 MED ORDER — DICYCLOMINE HCL 20 MG PO TABS: 20.0000 mg | ORAL_TABLET | Freq: Two times a day (BID) | ORAL | Status: DC

## 2016-04-06 MED ORDER — POLYETHYLENE GLYCOL 3350 17 G PO PACK: 17.0000 g | PACK | Freq: Every day | ORAL | Status: DC

## 2016-04-06 MED ORDER — KETOROLAC TROMETHAMINE 60 MG/2ML IM SOLN
60.0000 mg | Freq: Once | INTRAMUSCULAR | Status: AC
  Administered 2016-04-06: 60 mg via INTRAMUSCULAR
  Filled 2016-04-06: qty 2

## 2016-04-06 NOTE — ED Notes (Signed)
Pt  C/o generalized abd pain for 2 hours  No nv or diaerrhea

## 2016-04-06 NOTE — ED Notes (Signed)
Pt stable, ambulatory, states understanding of discharge instructions 

## 2016-04-06 NOTE — ED Provider Notes (Signed)
CSN: [id number]     Arrival date & time 04/06/16  0105 History   By signing my name below, I, Suzan SlickAshley N. Elon SpannerLeger, attest that this documentation has been prepared under the direction and in the presence of Jaidynn Balster, MD.  Electronically Signed: Suzan SlickAshley N. Elon SpannerLeger, ED Scribe. 04/06/2016. 1:49 AM.   Chief Complaint  Patient presents with  . Abdominal Pain   Patient is a 65 y.o. male presenting with abdominal pain. The history is provided by the patient. No language interpreter was used.  Abdominal Pain Pain location:  Generalized Pain radiates to:  Does not radiate Pain severity:  Moderate Onset quality:  Unable to specify Duration:  2 hours Timing:  Constant Progression:  Unchanged Chronicity:  New Context: not alcohol use   Relieved by:  Nothing Worsened by:  Nothing tried Ineffective treatments:  None tried Associated symptoms: no chills, no cough, no diarrhea, no fever, no nausea and no vomiting   Risk factors: no recent hospitalization     HPI Comments: Joseph Lamb is a 65 y.o. male with a PMHx of pre diabetes, HTN, and high cholesterol who presents to the Emergency Department complaining of constant, unchanged generalized abdominal pain x 2 hours. Discomfort noted after eating 592 week old strawberries and pound cake. No aggravating or alleviating factors at this time. He states stools have been harder in the last several days. No OTC medications or home remedies attempted prior to arrival. No recent fever, chills, nausea, vomiting, or diarrhea. Wife states pt has 'gallbladder" issues. He was diagnosed with sludge build up 4-5 years ago but denies follow up afterwards.  Pt states he is currently involved in a clinical trial for an older diabetes medication that is aiming to transition to oral use. He is taking this medication every morning and denies any issues since starting medication.  PCP: Lupita RaiderSHAW,KIMBERLEE, MD    Past Medical History  Diagnosis Date  . Arrhythmia   .  Pre-diabetes   . High cholesterol   . Hypertension    Past Surgical History  Procedure Laterality Date  . Prostate surgery     Family History  Problem Relation Age of Onset  . Diabetes Mother   . Diabetes Father    Social History  Substance Use Topics  . Smoking status: Never Smoker   . Smokeless tobacco: Never Used  . Alcohol Use: 0.0 oz/week    0 Standard drinks or equivalent per week     Comment: rare    Review of Systems  Constitutional: Negative for fever and chills.  Respiratory: Negative for cough.   Gastrointestinal: Positive for abdominal pain. Negative for nausea, vomiting and diarrhea.  Neurological: Negative for headaches.  Psychiatric/Behavioral: Negative for confusion.  All other systems reviewed and are negative.     Allergies  Review of patient's allergies indicates no known allergies.  Home Medications   Prior to Admission medications   Medication Sig Start Date End Date Taking? Authorizing Provider  telmisartan (MICARDIS) 80 MG tablet Take 80 mg by mouth daily.   Yes Historical Provider, MD  albuterol (PROVENTIL HFA;VENTOLIN HFA) 108 (90 BASE) MCG/ACT inhaler Inhale 1-2 puffs into the lungs every 6 (six) hours as needed for wheezing. Patient not taking: Reported on 04/06/2016 01/07/12 04/06/16  Reuben Likesavid C Keller, MD  donepezil (ARICEPT) 10 MG tablet Take 1 tablet daily Patient not taking: Reported on 04/06/2016 06/14/15   Van ClinesKaren M Aquino, MD   Triage Vitals: BP 169/85 mmHg  Pulse 50  Temp(Src) 98.1 F (  36.7 C) (Oral)  Resp 20  Ht 6\' 1"  (1.854 m)  Wt 233 lb 5 oz (105.83 kg)  BMI 30.79 kg/m2  SpO2 99%   Physical Exam  Constitutional: He is oriented to person, place, and time. He appears well-developed and well-nourished.  HENT:  Head: Normocephalic and atraumatic.  Mouth/Throat: Oropharynx is clear and moist.  Eyes: EOM are normal. Pupils are equal, round, and reactive to light.  Neck: Normal range of motion. No tracheal deviation present.  No carotid  bruits   Cardiovascular: Normal rate, regular rhythm, normal heart sounds and intact distal pulses.   Pulses:      Dorsalis pedis pulses are 2+ on the right side, and 2+ on the left side.  Pulmonary/Chest: Effort normal and breath sounds normal. No stridor. No respiratory distress. He has no wheezes. He has no rales.  Abdominal: Soft. He exhibits no distension. There is no tenderness. There is no rebound and no guarding.  Gassy on exam. No pain at McBurney's point. No Murphy's sign.  Musculoskeletal: Normal range of motion.  Al compartments are soft   Neurological: He is alert and oriented to person, place, and time. He has normal reflexes.  Skin: Skin is warm and dry.  Psychiatric: He has a normal mood and affect. Judgment normal.  Nursing note and vitals reviewed.   ED Course  Procedures (including critical care time)  DIAGNOSTIC STUDIES: Oxygen Saturation is 99% on RA, Normal by my interpretation.    COORDINATION OF CARE: 1:40 AM- Will order blood work and urinalysis. Discussed treatment plan with pt at bedside and pt agreed to plan.     Labs Review Labs Reviewed  COMPREHENSIVE METABOLIC PANEL - Abnormal; Notable for the following:    Glucose, Bld 148 (*)    Creatinine, Ser 1.28 (*)    GFR calc non Af Amer 56 (*)    All other components within normal limits  LIPASE, BLOOD  CBC  URINALYSIS, ROUTINE W REFLEX MICROSCOPIC (NOT AT [facility])    Imaging Review No results found. I have personally reviewed and evaluated these images and lab results as part of my medical decision-making.   EKG Interpretation None      MDM   Final diagnoses:  None   Filed Vitals:   04/06/16 0302 04/06/16 0406  BP:  167/75  Pulse: 55 57  Temp:    Resp:  18     Results for orders placed or performed during the hospital encounter of 04/06/16  Lipase, blood  Result Value Ref Range   Lipase 25 11 - 51 U/L  Comprehensive metabolic panel  Result Value Ref Range   Sodium 139 135 - 145  mmol/L   Potassium 3.9 3.5 - 5.1 mmol/L   Chloride 105 101 - 111 mmol/L   CO2 26 22 - 32 mmol/L   Glucose, Bld 148 (H) 65 - 99 mg/dL   BUN 10 6 - 20 mg/dL   Creatinine, Ser 4.091.28 (H) 0.61 - 1.24 mg/dL   Calcium 9.6 8.9 - 81.110.3 mg/dL   Total Protein 7.5 6.5 - 8.1 g/dL   Albumin 3.8 3.5 - 5.0 g/dL   AST 17 15 - 41 U/L   ALT 17 17 - 63 U/L   Alkaline Phosphatase 106 38 - 126 U/L   Total Bilirubin 0.6 0.3 - 1.2 mg/dL   GFR calc non Af Amer 56 (L) >60 mL/min   GFR calc Af Amer >60 >60 mL/min   Anion gap 8 5 - 15  CBC  Result Value Ref Range   WBC 9.1 4.0 - 10.5 K/uL   RBC 5.79 4.22 - 5.81 MIL/uL   Hemoglobin 16.0 13.0 - 17.0 g/dL   HCT 16.1 09.6 - 04.5 %   MCV 84.3 78.0 - 100.0 fL   MCH 27.6 26.0 - 34.0 pg   MCHC 32.8 30.0 - 36.0 g/dL   RDW 40.9 81.1 - 91.4 %   Platelets 229 150 - 400 K/uL  Urinalysis, Routine w reflex microscopic  Result Value Ref Range   Color, Urine YELLOW YELLOW   APPearance CLEAR CLEAR   Specific Gravity, Urine 1.019 1.005 - 1.030   pH 5.0 5.0 - 8.0   Glucose, UA NEGATIVE NEGATIVE mg/dL   Hgb urine dipstick NEGATIVE NEGATIVE   Bilirubin Urine NEGATIVE NEGATIVE   Ketones, ur NEGATIVE NEGATIVE mg/dL   Protein, ur NEGATIVE NEGATIVE mg/dL   Nitrite NEGATIVE NEGATIVE   Leukocytes, UA NEGATIVE NEGATIVE    Medications - No data to display   Medications  dicyclomine (BENTYL) injection 20 mg (20 mg Intramuscular Given 04/06/16 0259)  gi cocktail (Maalox,Lidocaine,Donnatal) (30 mLs Oral Given 04/06/16 0300)  ketorolac (TORADOL) injection 60 mg (60 mg Intramuscular Given 04/06/16 0300)   Feeling better post medication,  Clinically and on XR has constipation.  No murphy's sign no acute findings on ultrasound.  Follow up with your PMD.  Will start miralax for constipation and bentyl for cramping with strict return precautions  I personally performed the services described in this documentation, which was scribed in my presence. The recorded information has been reviewed  and is accurate.     Cy Blamer, MD 04/06/16 [phone]

## 2017-07-21 ENCOUNTER — Ambulatory Visit: Payer: Medicare Other | Admitting: Cardiology

## 2017-07-24 ENCOUNTER — Ambulatory Visit (INDEPENDENT_AMBULATORY_CARE_PROVIDER_SITE_OTHER): Payer: Medicare Other | Admitting: Cardiology

## 2017-07-24 ENCOUNTER — Encounter: Payer: Self-pay | Admitting: Cardiology

## 2017-07-24 VITALS — BP 154/68 | HR 66 | Ht 73.0 in | Wt 231.0 lb

## 2017-07-24 DIAGNOSIS — E782 Mixed hyperlipidemia: Secondary | ICD-10-CM | POA: Diagnosis not present

## 2017-07-24 DIAGNOSIS — I10 Essential (primary) hypertension: Secondary | ICD-10-CM | POA: Diagnosis not present

## 2017-07-24 DIAGNOSIS — E119 Type 2 diabetes mellitus without complications: Secondary | ICD-10-CM

## 2017-07-24 MED ORDER — ASPIRIN EC 81 MG PO TBEC: 81.0000 mg | DELAYED_RELEASE_TABLET | Freq: Every day | ORAL | 3 refills | Status: DC

## 2017-07-24 NOTE — Patient Instructions (Signed)
Medication Instructions:  Your physician has recommended you make the following change in your medication:  START Aspirin enteric coated aspirin  Labwork: Your physician recommends that you have TSH drawn  Testing/Procedures: Your physician has requested that you have a stress echocardiogram. For further information please visit [email or web address]. Please follow instruction sheet as given.  Your physician has recommended that you wear a holter monitor. Holter monitors are medical devices that record the heart's electrical activity. Doctors most often use these monitors to diagnose arrhythmias. Arrhythmias are problems with the speed or rhythm of the heartbeat. The monitor is a small, portable device. You can wear one while you do your normal daily activities. This is usually used to diagnose what is causing palpitations/syncope (passing out).    Follow-Up: 6 months  Any Other Special Instructions Will Be Listed Below (If Applicable).     If you need a refill on your cardiac medications before your next appointment, please call your pharmacy.   Holter Monitoring A Holter monitor is a small device that is used to detect abnormal heart rhythms. It clips to your clothing and is connected by wires to flat, sticky disks (electrodes) that attach to your chest. It is worn continuously for 24-48 hours. Follow these instructions at home:  Wear your Holter monitor at all times, even while exercising and sleeping, for as long as directed by your health care provider.  Make sure that the Holter monitor is safely clipped to your clothing or close to your body as recommended by your health care provider.  Do not get the monitor or wires wet.  Do not put body lotion or moisturizer on your chest.  Keep your skin clean.  Keep a diary of your daily activities, such as walking and doing chores. If you feel that your heartbeat is abnormal or that your heart is fluttering or skipping a  beat: ? Record what you are doing when it happens. ? Record what time of day the symptoms occur.  Return your Holter monitor as directed by your health care provider.  Keep all follow-up visits as directed by your health care provider. This is important. Get help right away if:  You feel lightheaded or you faint.  You have trouble breathing.  You feel pain in your chest, upper arm, or jaw.  You feel sick to your stomach and your skin is pale, cool, or damp.  You heartbeat feels unusual or abnormal. This information is not intended to replace advice given to you by your health care provider. Make sure you discuss any questions you have with your health care provider. Document Released: 06/21/2004 Document Revised: 02/29/2016 Document Reviewed: 05/02/2014 Elsevier Interactive Patient Education  2018 [employer]  Exercise Stress Echocardiogram An exercise stress echocardiogram is a test to check how well your heart is working. This test uses sound waves (ultrasound) and a computer to make images of your heart before and after exercise. Ultrasound images that are taken before you exercise (your resting echocardiogram) will show how much blood is getting to your heart muscle and how well your heart muscle and heart valves are functioning. During the next part of this test, you will walk on a treadmill or ride a stationary bike to see how exercise affects your heart. While you exercise, the electrical activity of your heart will be monitored with an electrocardiogram (ECG). Your blood pressure will also be monitored. You may have this test if you:  Have chest pain or other symptoms of a heart problem.  Recently had a heart attack or heart surgery.  Have heart valve problems.  Have a condition that causes narrowing of the blood vessels that supply your heart (coronary artery disease).  Have a high risk of heart disease and are starting a new exercise program.  Have a high risk of  heart disease and need to have major surgery.  Tell a health care provider about:  Any allergies you have.  All medicines you are taking, including vitamins, herbs, eye drops, creams, and over-the-counter medicines.  Any problems you or family members have had with anesthetic medicines.  Any blood disorders you have.  Any surgeries you have had.  Any medical conditions you have.  Whether you are pregnant or may be pregnant. What are the risks? Generally, this is a safe procedure. However, problems may occur, including:  Chest pain.  Dizziness or light-headedness.  Shortness of breath.  Increased or irregular heartbeat (palpitations).  Nausea or vomiting.  Heart attack (very rare).  What happens before the procedure?  Follow instructions from your health care provider about eating or drinking restrictions. You may be asked to avoid all forms of caffeine for 24 hours before your procedure, or as told by your health care provider.  Ask your health care provider about changing or stopping your regular medicines. This is especially important if you are taking diabetes medicines or blood thinners.  If you use an inhaler, bring it with you to the test.  Wear loose, comfortable clothing and walking shoes.  Do notuse any products that contain nicotine or tobacco, such as cigarettes and e-cigarettes, for 4 hours before the test or as told by your health care provider. If you need help quitting, ask your health care provider. What happens during the procedure?  You will take off your clothes from the waist up and put on a hospital gown.  A technician will place electrodes on your chest.  A blood pressure cuff will be placed on your arm.  You will lie down on a table for an ultrasound exam before you exercise. Gel will be rubbed on your chest, and a handheld device (transducer) will be pressed against your chest and moved over your heart.  Then, you will start exercising by  walking on a treadmill or pedaling a stationary bicycle.  Your blood pressure and heart rhythm will be monitored while you exercise.  The exercise will gradually get harder or faster.  You will exercise until: ? Your heart reaches a target level. ? You are too tired to continue. ? You cannot continue because of chest pain, weakness, or dizziness.  You will have another ultrasound exam after you stop exercising. The procedure may vary among health care providers and hospitals. What happens after the procedure?  Your heart rate and blood pressure will be monitored until they return to your normal levels. Summary  An exercise stress echocardiogram is a test that uses ultrasound to check how well your heart works before and after exercise.  Before the test, follow instructions from your health care provider about stopping medications, avoiding nicotine and tobacco, and avoiding certain foods and drinks.  During the test, your blood pressure and heart rhythm will be monitored while you exercise on a treadmill or stationary bicycle. This information is not intended to replace advice given to you by your health care provider. Make sure you discuss any questions you have with your health care provider. Document Released: 09/27/2004 Document Revised: 05/15/2016 Document Reviewed: 05/15/2016 Elsevier Interactive Patient Education  2018 Elsevier Inc.  

## 2017-07-24 NOTE — Addendum Note (Signed)
Addended by: Craige CottaANDERSON, Kenidee Cregan S on: 07/24/2017 03:16 PM   Modules accepted: Orders

## 2017-07-24 NOTE — Progress Notes (Signed)
Cardiology Office Note:    Date:  07/24/2017   ID:  Joseph Lamb, DOB 1951-02-01, MRN [id number]  PCP:  Lupita RaiderShaw, Kimberlee, MD  Cardiologist:  Garwin Brothersajan R Samuele Storey, MD   Referring MD: Lupita RaiderShaw, Kimberlee, MD    ASSESSMENT:    1. Essential hypertension   2. Type 2 diabetes mellitus without complication, without long-term current use of insulin (HCC)   3. Mixed hyperlipidemia    PLAN:    In order of problems listed above:  1. I reassured the patient about my findings. I reviewed his EKG. In view of bradycardia I will do a TSH and also 48 hour Holter monitoring. 2. Patient has multiple risk factors for coronary artery disease and therefore we'll do an exercise stress echo. 3. I mentioned to him that he needs to take a coated baby aspirin 80 mg And statin therapy. I also tells told him that he needs to be on ACE or ARB,and he mentions to me that he takes it occasionally. I told him to discuss these issues with his primary care physician Extensively. 4. Patient will be seen in follow-up appointment in 6 months or earlier if the patient has any concerns.   Medication Adjustments/Labs and Tests Ordered: Current medicines are reviewed at length with the patient today.  Concerns regarding medicines are outlined above.  No orders of the defined types were placed in this encounter.  No orders of the defined types were placed in this encounter.    History of Present Illness:    Joseph Lamb is a 66 y.o. male who is being seen today for the evaluation of bradycardia and chest discomfort at the request of Lupita RaiderShaw, Kimberlee, MD. Patient is a pleasant 66 year old male. He has past medical history of diabetes mellitus and dyslipidemia. He mentions to me that he takes care of activities of daily living. He sees that his heart rate is on the slower side at times. He does not give me any numbers. He leads a sedentary lifestyle. No chest pain orthopnea or PND. He is concerned about his cardiac status  as he occasionally has chest discomfort. This is not related to exertion. At the time of my evaluation is alert awake oriented and in no distress.  Past Medical History:  Diagnosis Date  . Arrhythmia   . High cholesterol   . Hypertension   . Pre-diabetes     Past Surgical History:  Procedure Laterality Date  . PROSTATE SURGERY      Current Medications: Current Meds  Medication Sig  . donepezil (ARICEPT) 10 MG tablet Take 1 tablet daily  . telmisartan (MICARDIS) 80 MG tablet Take 80 mg by mouth daily.  . TRULICITY 1.5 MG/0.5ML SOPN Inject 1.5 mg into the skin once a week.     Allergies:   Lisinopril; Crestor [rosuvastatin calcium]; and Simvastatin   Social History   Social History  . Marital status: Married    Spouse name: N/A  . Number of children: 2  . Years of education: N/A   Occupational History  . Retired    Social History Main Topics  . Smoking status: Never Smoker  . Smokeless tobacco: Never Used  . Alcohol use 0.0 oz/week     Comment: rare  . Drug use: No  . Sexual activity: Not Asked   Other Topics Concern  . None   Social History Narrative  . None     Family History: The patient's family history includes Diabetes in his father and mother.  ROS:   Please see the history of present illness.    All other systems reviewed and are negative.  EKGs/Labs/Other Studies Reviewed:    The following studies were reviewed today: I reviewed office records and discussed findings with the patient and including lipids   Recent Labs: No results found for requested labs within last 8760 hours.  Recent Lipid Panel No results found for: CHOL, TRIG, HDL, CHOLHDL, VLDL, LDLCALC, LDLDIRECT  Physical Exam:    VS:  BP (!) 154/68   Pulse 66   Ht 6\' 1"  (1.854 m)   Wt 231 lb 0.6 oz (104.8 kg)   SpO2 97%   BMI 30.48 kg/m     Wt Readings from Last 3 Encounters:  07/24/17 231 lb 0.6 oz (104.8 kg)  04/06/16 233 lb 5 oz (105.8 kg)  06/14/15 227 lb (103 kg)       GEN: Patient is in no acute distress HEENT: Normal NECK: No JVD; No carotid bruits LYMPHATICS: No lymphadenopathy CARDIAC: S1 S2 regular, 2/6 systolic murmur at the apex. RESPIRATORY:  Clear to auscultation without rales, wheezing or rhonchi  ABDOMEN: Soft, non-tender, non-distended MUSCULOSKELETAL:  No edema; No deformity  SKIN: Warm and dry NEUROLOGIC:  Alert and oriented x 3 PSYCHIATRIC:  Normal affect    Signed, Garwin Brothers, MD  07/24/2017 2:44 PM    [facility]

## 2017-07-25 LAB — TSH: TSH: 3.44 u[IU]/mL (ref 0.450–4.500)

## 2017-08-05 ENCOUNTER — Ambulatory Visit (HOSPITAL_BASED_OUTPATIENT_CLINIC_OR_DEPARTMENT_OTHER)
Admission: RE | Admit: 2017-08-05 | Discharge: 2017-08-05 | Disposition: A | Discharge status: Home / Self Care | Payer: Medicare Other | Source: Ambulatory Visit | Attending: Cardiology | Admitting: Cardiology

## 2017-08-05 ENCOUNTER — Ambulatory Visit: Payer: Medicare Other

## 2017-08-05 DIAGNOSIS — R9439 Abnormal result of other cardiovascular function study: Secondary | ICD-10-CM | POA: Diagnosis not present

## 2017-08-05 DIAGNOSIS — I10 Essential (primary) hypertension: Secondary | ICD-10-CM

## 2017-08-05 NOTE — Progress Notes (Signed)
  Echocardiogram Echocardiogram Stress Test has been performed.  Joseph Lamb, Joseph Lamb 08/05/2017, 11:21 AM

## 2017-08-14 ENCOUNTER — Telehealth: Payer: Self-pay

## 2017-08-14 NOTE — Telephone Encounter (Signed)
Attempted to call the patient with holter results. No answer and no voicemail set up, will try to call again later.

## 2017-08-15 ENCOUNTER — Telehealth: Payer: Self-pay

## 2017-08-15 NOTE — Telephone Encounter (Signed)
Attempted to inform the patient of his results again.

## 2017-08-18 ENCOUNTER — Telehealth: Payer: Self-pay | Admitting: Cardiology

## 2017-08-18 NOTE — Telephone Encounter (Signed)
I have attempted to call this patient multiple times with no answer and unable to leave voicemail. Please send call to me when he calls again. Thanks

## 2017-08-18 NOTE — Telephone Encounter (Signed)
Wants monitor results °

## 2017-08-19 ENCOUNTER — Telehealth: Payer: Self-pay

## 2017-08-19 NOTE — Telephone Encounter (Signed)
Again tried to contact the patient with no answer.

## 2017-08-21 ENCOUNTER — Telehealth: Payer: Self-pay

## 2017-08-21 NOTE — Telephone Encounter (Signed)
Attempted to call patient again with no answer/voicemail.

## 2020-02-08 ENCOUNTER — Ambulatory Visit: Payer: Medicare Other | Admitting: Cardiology

## 2020-03-08 ENCOUNTER — Encounter: Payer: Self-pay | Admitting: Cardiology

## 2020-03-08 ENCOUNTER — Ambulatory Visit (INDEPENDENT_AMBULATORY_CARE_PROVIDER_SITE_OTHER): Payer: Medicare Other

## 2020-03-08 ENCOUNTER — Other Ambulatory Visit: Payer: Self-pay

## 2020-03-08 ENCOUNTER — Ambulatory Visit: Payer: Medicare Other | Admitting: Cardiology

## 2020-03-08 VITALS — BP 132/64 | HR 47 | Ht 73.0 in | Wt 216.0 lb

## 2020-03-08 DIAGNOSIS — R42 Dizziness and giddiness: Secondary | ICD-10-CM

## 2020-03-08 DIAGNOSIS — E119 Type 2 diabetes mellitus without complications: Secondary | ICD-10-CM

## 2020-03-08 DIAGNOSIS — R001 Bradycardia, unspecified: Secondary | ICD-10-CM

## 2020-03-08 DIAGNOSIS — E782 Mixed hyperlipidemia: Secondary | ICD-10-CM

## 2020-03-08 DIAGNOSIS — I10 Essential (primary) hypertension: Secondary | ICD-10-CM

## 2020-03-08 HISTORY — DX: Bradycardia, unspecified: R00.1

## 2020-03-08 HISTORY — DX: Dizziness and giddiness: R42

## 2020-03-08 MED ORDER — AMLODIPINE BESYLATE 5 MG PO TABS: 5.0000 mg | ORAL_TABLET | Freq: Every day | ORAL | 3 refills | Status: DC

## 2020-03-08 NOTE — Progress Notes (Signed)
Cardiology Office Note:    Date:  03/08/2020   ID:  Joseph Lamb, DOB [birth year], MRN [id number]  PCP:  Mayra Neer, MD  Cardiologist:  Jenean Lindau, MD   Referring MD: Mayra Neer, MD    ASSESSMENT:    1. Essential hypertension   2. Mixed hyperlipidemia   3. Type 2 diabetes mellitus without complication, without long-term current use of insulin ([place])   4. Dizzy spells   5. Sinus bradycardia    PLAN:    In order of problems listed above:  1. Primary prevention stressed with the patient.  Importance of compliance with diet medication stressed and he vocalized understanding. 2. Essential hypertension: Blood pressure is stable.  In view of disease plus I cut amlodipine to half dose.  Also I would like to make sure whether he has any symptomatic bradycardia so we will do a 3-day monitor. 3. Cardiac murmur: Echocardiogram will be done to assess murmur heard on auscultation 4. Diabetes mellitus: His doctor has stopped his telmisartan.  I am not sure the reason but I would leave it up to them.  I am sure there is a good reason for this as generally ACE inhibitor's or ARB's are indicated in diabetics but again I will leave this issue to his primary care provider and her discretion. 5. Follow-up appointment in a month or earlier if he has any concerns.  He knows to go to nearest emergency room for any concerning symptoms.  Patient and wife had multiple questions which were answered to their satisfaction.   Medication Adjustments/Labs and Tests Ordered: Current medicines are reviewed at length with the patient today.  Concerns regarding medicines are outlined above.  No orders of the defined types were placed in this encounter.  No orders of the defined types were placed in this encounter.    Chief Complaint  Patient presents with   Follow-up     History of Present Illness:    Joseph Lamb is a 69 y.o. male.  Patient has past medical history of essential  hypertension and diabetes mellitus.  His wife accompanies him for the visit.  She mentions to me that he is losing his memory fairly fast.  No chest pain orthopnea or PND.  At the time of my evaluation, the patient is alert awake oriented and in no distress.  Patient gives history of feeling lightheaded at times and this near time relates to change in posture.  At the time of my evaluation, the patient is alert awake oriented and in no distress.  Past Medical History:  Diagnosis Date   Arrhythmia    Diabetes mellitus type 2, uncomplicated ([place]) 01/08/346   Essential hypertension 06/14/2015   High cholesterol    Hyperlipidemia 06/14/2015   Hypertension    Memory loss 06/14/2015   Pre-diabetes     Past Surgical History:  Procedure Laterality Date   PROSTATE SURGERY      Current Medications: Current Meds  Medication Sig   amLODipine (NORVASC) 10 MG tablet Take 10 mg by mouth daily.   aspirin EC 81 MG tablet Take 1 tablet (81 mg total) by mouth daily.   donepezil (ARICEPT) 5 MG tablet Take 5 mg by mouth at bedtime.   hydrochlorothiazide (HYDRODIURIL) 25 MG tablet Take 25 mg by mouth every morning.   latanoprost (XALATAN) 0.005 % ophthalmic solution 1 drop at bedtime.   pravastatin (PRAVACHOL) 10 MG tablet Take 10 mg by mouth daily.   sertraline (ZOLOFT) 50 MG tablet  Take 50 mg by mouth daily.   tadalafil (CIALIS) 20 MG tablet Take 20 mg by mouth daily as needed.   TRULICITY 1.5 MG/0.5ML SOPN Inject 1.5 mg into the skin once a week.     Allergies:   Lisinopril, Crestor [rosuvastatin calcium], and Simvastatin   Social History   Socioeconomic History   Marital status: Married    Spouse name: Not on file   Number of children: 2   Years of education: Not on file   Highest education level: Not on file  Occupational History   Occupation: Retired  Tobacco Use   Smoking status: Never Smoker   Smokeless tobacco: Never Used  Substance and Sexual Activity   Alcohol  use: Yes    Alcohol/week: 0.0 standard drinks    Comment: rare   Drug use: No   Sexual activity: Not on file  Other Topics Concern   Not on file  Social History Narrative   Not on file   Social Determinants of Health   Financial Resource Strain:    Difficulty of Paying Living Expenses:   Food Insecurity:    Worried About Programme researcher, broadcasting/film/video in the Last Year:    Barista in the Last Year:   Transportation Needs:    Freight forwarder (Medical):    Lack of Transportation (Non-Medical):   Physical Activity:    Days of Exercise per Week:    Minutes of Exercise per Session:   Stress:    Feeling of Stress :   Social Connections:    Frequency of Communication with Friends and Family:    Frequency of Social Gatherings with Friends and Family:    Attends Religious Services:    Active Member of Clubs or Organizations:    Attends Banker Meetings:    Marital Status:      Family History: The patient's family history includes Diabetes in his father and mother.  ROS:   Please see the history of present illness.    All other systems reviewed and are negative.  EKGs/Labs/Other Studies Reviewed:    The following studies were reviewed today: EKG reveals sinus bradycardia nonspecific ST-T changes   Recent Labs: No results found for requested labs within last 8760 hours.  Recent Lipid Panel No results found for: CHOL, TRIG, HDL, CHOLHDL, VLDL, LDLCALC, LDLDIRECT  Physical Exam:    VS:  BP 132/64    Pulse (!) 47    Ht 6\' 1"  (1.854 m)    Wt 216 lb (98 kg)    SpO2 97%    BMI 28.50 kg/m     Wt Readings from Last 3 Encounters:  03/08/20 216 lb (98 kg)  07/24/17 231 lb 0.6 oz (104.8 kg)  04/06/16 233 lb 5 oz (105.8 kg)     GEN: Patient is in no acute distress HEENT: Normal NECK: No JVD; No carotid bruits LYMPHATICS: No lymphadenopathy CARDIAC: Hear sounds regular, 2/6 systolic murmur at the apex. RESPIRATORY:  Clear to auscultation  without rales, wheezing or rhonchi  ABDOMEN: Soft, non-tender, non-distended MUSCULOSKELETAL:  No edema; No deformity  SKIN: Warm and dry NEUROLOGIC:  Alert and oriented x 3 PSYCHIATRIC:  Normal affect   Signed, 06/07/16, MD  03/08/2020 3:07 PM    [facility]

## 2020-03-08 NOTE — Patient Instructions (Signed)
Medication Instructions:  Your physician has recommended you make the following change in your medication:   Decrease your Amlodipine to 5 mg daily.  *If you need a refill on your cardiac medications before your next appointment, please call your pharmacy*   Lab Work: None ordered If you have labs (blood work) drawn today and your tests are completely normal, you will receive your results only by: Marland Kitchen MyChart Message (if you have MyChart) OR . A paper copy in the mail If you have any lab test that is abnormal or we need to change your treatment, we will call you to review the results.   Testing/Procedures: Your physician has requested that you have an echocardiogram. Echocardiography is a painless test that uses sound waves to create images of your heart. It provides your doctor with information about the size and shape of your heart and how well your heart's chambers and valves are working. This procedure takes approximately one hour. There are no restrictions for this procedure.   WHY IS MY DOCTOR PRESCRIBING ZIO? The Zio system is proven and trusted by physicians to detect and diagnose irregular heart rhythms -- and has been prescribed to hundreds of thousands of patients.  The FDA has cleared the Zio system to monitor for many different kinds of irregular heart rhythms. In a study, physicians were able to reach a diagnosis 90% of the time with the Zio system1.  You can wear the Zio monitor -- a small, discreet, comfortable patch -- during your normal day-to-day activity, including while you sleep, shower, and exercise, while it records every single heartbeat for analysis.  1Barrett, P., et al. Comparison of 24 Hour Holter Monitoring Versus 14 Day Novel Adhesive Patch Electrocardiographic Monitoring. Bertrand, 2014.  ZIO VS. HOLTER MONITORING The Zio monitor can be comfortably worn for up to 14 days. Holter monitors can be worn for 24 to 48 hours, limiting the time  to record any irregular heart rhythms you may have. Zio is able to capture data for the 51% of patients who have their first symptom-triggered arrhythmia after 48 hours.1  LIVE WITHOUT RESTRICTIONS The Zio ambulatory cardiac monitor is a small, unobtrusive, and water-resistant patch--you might even forget you're wearing it. The Zio monitor records and stores every beat of your heart, whether you're sleeping, working out, or showering.  Wear the monitor for 3 days, remove on 03/11/20.   Follow-Up: At [facility], you and your health needs are our priority.  As part of our continuing mission to provide you with exceptional heart care, we have created designated Provider Care Teams.  These Care Teams include your primary Cardiologist (physician) and Advanced Practice Providers (APPs -  Physician Assistants and Nurse Practitioners) who all work together to provide you with the care you need, when you need it.  We recommend signing up for the patient portal called "MyChart".  Sign up information is provided on this After Visit Summary.  MyChart is used to connect with patients for Virtual Visits (Telemedicine).  Patients are able to view lab/test results, encounter notes, upcoming appointments, etc.  Non-urgent messages can be sent to your provider as well.   To learn more about what you can do with MyChart, go to [email or web address].    Your next appointment:   1 month(s)  The format for your next appointment:   In Person  Provider:   Jyl Heinz, MD   Other Instructions  Echocardiogram An echocardiogram is a procedure that uses painless sound waves (  ultrasound) to produce an image of the heart. Images from an echocardiogram can provide important information about:  Signs of coronary artery disease (CAD).  Aneurysm detection. An aneurysm is a weak or damaged part of an artery wall that bulges out from the normal force of blood pumping through the body.  Heart size and shape.  Changes in the size or shape of the heart can be associated with certain conditions, including heart failure, aneurysm, and CAD.  Heart muscle function.  Heart valve function.  Signs of a past heart attack.  Fluid buildup around the heart.  Thickening of the heart muscle.  A tumor or infectious growth around the heart valves. Tell a health care provider about:  Any allergies you have.  All medicines you are taking, including vitamins, herbs, eye drops, creams, and over-the-counter medicines.  Any blood disorders you have.  Any surgeries you have had.  Any medical conditions you have.  Whether you are pregnant or may be pregnant. What are the risks? Generally, this is a safe procedure. However, problems may occur, including:  Allergic reaction to dye (contrast) that may be used during the procedure. What happens before the procedure? No specific preparation is needed. You may eat and drink normally. What happens during the procedure?   An IV tube may be inserted into one of your veins.  You may receive contrast through this tube. A contrast is an injection that improves the quality of the pictures from your heart.  A gel will be applied to your chest.  A wand-like tool (transducer) will be moved over your chest. The gel will help to transmit the sound waves from the transducer.  The sound waves will harmlessly bounce off of your heart to allow the heart images to be captured in real-time motion. The images will be recorded on a computer. The procedure may vary among health care providers and hospitals. What happens after the procedure?  You may return to your normal, everyday life, including diet, activities, and medicines, unless your health care provider tells you not to do that. Summary  An echocardiogram is a procedure that uses painless sound waves (ultrasound) to produce an image of the heart.  Images from an echocardiogram can provide important information  about the size and shape of your heart, heart muscle function, heart valve function, and fluid buildup around your heart.  You do not need to do anything to prepare before this procedure. You may eat and drink normally.  After the echocardiogram is completed, you may return to your normal, everyday life, unless your health care provider tells you not to do that. This information is not intended to replace advice given to you by your health care provider. Make sure you discuss any questions you have with your health care provider. Document Revised: 01/14/2019 Document Reviewed: 10/26/2016 Elsevier Patient Education  2020 [employer].

## 2020-03-17 ENCOUNTER — Ambulatory Visit (HOSPITAL_BASED_OUTPATIENT_CLINIC_OR_DEPARTMENT_OTHER): Payer: Medicare Other

## 2020-03-29 ENCOUNTER — Ambulatory Visit (HOSPITAL_BASED_OUTPATIENT_CLINIC_OR_DEPARTMENT_OTHER)
Admission: RE | Admit: 2020-03-29 | Discharge: 2020-03-29 | Disposition: A | Discharge status: Home / Self Care | Payer: Medicare Other | Source: Ambulatory Visit | Attending: Cardiology | Admitting: Cardiology

## 2020-03-29 ENCOUNTER — Other Ambulatory Visit: Payer: Self-pay

## 2020-03-29 DIAGNOSIS — R001 Bradycardia, unspecified: Secondary | ICD-10-CM | POA: Insufficient documentation

## 2020-03-29 DIAGNOSIS — R42 Dizziness and giddiness: Secondary | ICD-10-CM | POA: Diagnosis not present

## 2020-03-29 DIAGNOSIS — E119 Type 2 diabetes mellitus without complications: Secondary | ICD-10-CM | POA: Diagnosis present

## 2020-03-31 ENCOUNTER — Telehealth: Payer: Self-pay | Admitting: Cardiology

## 2020-03-31 NOTE — Telephone Encounter (Signed)
Called patient informed him of results.  

## 2020-03-31 NOTE — Telephone Encounter (Signed)
New Message   Pt is calling back for results    Please call back   

## 2020-04-05 ENCOUNTER — Ambulatory Visit: Payer: Medicare Other | Admitting: Neurology

## 2020-04-05 ENCOUNTER — Other Ambulatory Visit: Payer: Self-pay

## 2020-04-05 ENCOUNTER — Encounter: Payer: Self-pay | Admitting: Neurology

## 2020-04-05 VITALS — BP 138/79 | HR 58 | Resp 20 | Ht 73.0 in | Wt 217.0 lb

## 2020-04-05 DIAGNOSIS — R251 Tremor, unspecified: Secondary | ICD-10-CM | POA: Diagnosis not present

## 2020-04-05 DIAGNOSIS — G3184 Mild cognitive impairment, so stated: Secondary | ICD-10-CM

## 2020-04-05 DIAGNOSIS — R413 Other amnesia: Secondary | ICD-10-CM

## 2020-04-05 NOTE — Patient Instructions (Addendum)
1. Schedule MRI brain with and without contrast  2. Schedule Neurocognitive testing  3. Continue all your medications  4. We will continue to monitor tremors for now  5. Follow-up in 6 months, call for any changes   FALL PRECAUTIONS: Be cautious when walking. Scan the area for obstacles that may increase the risk of trips and falls. When getting up in the mornings, sit up at the edge of the bed for a few minutes before getting out of bed. Consider elevating the bed at the head end to avoid drop of blood pressure when getting up. Walk always in a well-lit room (use night lights in the walls). Avoid area rugs or power cords from appliances in the middle of the walkways. Use a walker or a cane if necessary and consider physical therapy for balance exercise. Get your eyesight checked regularly.  FINANCIAL OVERSIGHT: Supervision, especially oversight when making financial decisions or transactions is also recommended as difficulties arise.  HOME SAFETY: Consider the safety of the kitchen when operating appliances like stoves, microwave oven, and blender. Consider having supervision and share cooking responsibilities until no longer able to participate in those. Accidents with firearms and other hazards in the house should be identified and addressed as well.  DRIVING: Regarding driving, in patients with progressive memory problems, driving will be impaired. We advise to have someone else do the driving if trouble finding directions or if minor accidents are reported. Independent driving assessment is available to determine safety of driving.  ABILITY TO BE LEFT ALONE: If patient is unable to contact 911 operator, consider using LifeLine, or when the need is there, arrange for someone to stay with patients. Smoking is a fire hazard, consider supervision or cessation. Risk of wandering should be assessed by caregiver and if detected at any point, supervision and safe proof recommendations should be  instituted.  MEDICATION SUPERVISION: Inability to self-administer medication needs to be constantly addressed. Implement a mechanism to ensure safe administration of the medications.  RECOMMENDATIONS FOR ALL PATIENTS WITH MEMORY PROBLEMS: 1. Continue to exercise (Recommend 30 minutes of walking everyday, or 3 hours every week) 2. Increase social interactions - continue going to [place] and enjoy social gatherings with friends and family 3. Eat healthy, avoid fried foods and eat more fruits and vegetables 4. Maintain adequate blood pressure, blood sugar, and blood cholesterol level. Reducing the risk of stroke and cardiovascular disease also helps promoting better memory. 5. Avoid stressful situations. Live a simple life and avoid aggravations. Organize your time and prepare for the next day in anticipation. 6. Sleep well, avoid any interruptions of sleep and avoid any distractions in the bedroom that may interfere with adequate sleep quality 7. Avoid sugar, avoid sweets as there is a strong link between excessive sugar intake, diabetes, and cognitive impairment The Mediterranean diet has been shown to help patients reduce the risk of progressive memory disorders and reduces cardiovascular risk. This includes eating fish, eat fruits and green leafy vegetables, nuts like almonds and hazelnuts, walnuts, and also use olive oil. Avoid fast foods and fried foods as much as possible. Avoid sweets and sugar as sugar use has been linked to worsening of memory function.  We have sent a referral to [facility] for your MRI and they will call you directly to schedule your appointment. They are located at [address] [facility]. If you need to contact them directly please call [phone].

## 2020-04-05 NOTE — Progress Notes (Signed)
NEUROLOGY CONSULTATION NOTE  Joseph Lamb MRN: [id number] DOB: 1951/08/05  Referring provider: Dr. Lupita Lamb Primary care provider: Dr. Lupita Lamb  Reason for consult:  Memory changes  Dear Dr Clelia Croft:  Thank you for your kind referral of Joseph Lamb for consultation of the above symptoms. Although his history is well known to you, please allow me to reiterate it for the purpose of our medical record. He is alone in the office today. Records and images were personally reviewed where available.   HISTORY OF PRESENT ILLNESS: This is a 69 year old right-handed man with a history of hypertension, diabetes, hyperlipidemia, presenting for evaluation of memory changes. He was evaluated in our office in September 2016 for similar concerns, MOCA score at that time was 29/30. He was also evaluated by neurologist Dr. Marjory Lamb in 2013 for memory loss that started around 2010 or 2011. MOCA score 28/30 in 2013.  MRI brain in 2012 showed mild diffuse volume loss, mild to moderate chronic microvascular disease, a small area of cortical encephalomalacia in the right perirolandic cortex. He has been taking Donepezil 5mg  daily since 2013, however on his visit with Dr. 2014 last March, he reported he was not taking Donepezil and Sertraline regularly because he did not think he needed it. It was unclear if this was due to stubbornness or cognitive decline, his wife continued to report worsening memory and behavior. He states he has been taking medications regularly since March, he feels his memory is "fair, not perfect." He remembers "when I want to remember." His wife tells him he forgets to do things. He denies getting lost driving. He manages his own medications and occasionally misses doses when "I get lazy and go to sleep." He misplaces things frequently. If he is not careful, he has burned food. His wife manages finances. He feels more laidback since taking the sertraline regularly. He denies  any hallucinations. Sleep is good.   He is concerned about occasional bilateral hand tremors that started 4-5 months ago. He has noticed a little weakness in his left hand. Tremors are noticeable when he is using a pen or fork. No family history of tremors. He denies any headaches, diplopia, dysarthria/dysphagia, neck/back pain, focal numbness/tingling, bowel/bladder dysfunction, anosmia. No family history of dementia, no history of significant head injuries or alcohol use.    PAST MEDICAL HISTORY: Past Medical History:  Diagnosis Date  . Arrhythmia   . Diabetes mellitus type 2, uncomplicated (HCC) 06/14/2015  . Essential hypertension 06/14/2015  . High cholesterol   . Hyperlipidemia 06/14/2015  . Hypertension   . Memory loss 06/14/2015  . Pre-diabetes     PAST SURGICAL HISTORY: Past Surgical History:  Procedure Laterality Date  . PROSTATE SURGERY      MEDICATIONS: Current Outpatient Medications on File Prior to Visit  Medication Sig Dispense Refill  . amLODipine (NORVASC) 5 MG tablet Take 1 tablet (5 mg total) by mouth daily. 180 tablet 3  . aspirin EC 81 MG tablet Take 1 tablet (81 mg total) by mouth daily. 90 tablet 3  . donepezil (ARICEPT) 5 MG tablet Take 5 mg by mouth at bedtime.    . hydrochlorothiazide (HYDRODIURIL) 25 MG tablet Take 25 mg by mouth every morning.    . latanoprost (XALATAN) 0.005 % ophthalmic solution 1 drop at bedtime.    . pravastatin (PRAVACHOL) 10 MG tablet Take 10 mg by mouth daily.    . sertraline (ZOLOFT) 50 MG tablet Take 50 mg by mouth daily.    08/14/2015  tadalafil (CIALIS) 20 MG tablet Take 20 mg by mouth daily as needed.    . TRULICITY 1.5 MG/0.5ML SOPN Inject 1.5 mg into the skin once a week.  0   No current facility-administered medications on file prior to visit.    ALLERGIES: Allergies  Allergen Reactions  . Lisinopril Cough  . Crestor [Rosuvastatin Calcium]     " fatigue"   . Simvastatin Other (See Comments)    "fatigue"    FAMILY  HISTORY: Family History  Problem Relation Age of Onset  . Diabetes Mother   . Diabetes Father     SOCIAL HISTORY: Social History   Socioeconomic History  . Marital status: Married    Spouse name: Not on file  . Number of children: 2  . Years of education: 72  . Highest education level: Not on file  Occupational History  . Occupation: Retired  Tobacco Use  . Smoking status: Never Smoker  . Smokeless tobacco: Never Used  Vaping Use  . Vaping Use: Never used  Substance and Sexual Activity  . Alcohol use: Yes    Alcohol/week: 0.0 standard drinks    Comment: rare  . Drug use: No  . Sexual activity: Not on file  Other Topics Concern  . Not on file  Social History Narrative   Right handed   One story home   Drinks no caffeine, occ soda.   Social Determinants of Health   Financial Resource Strain:   . Difficulty of Paying Living Expenses:   Food Insecurity:   . Worried About Programme researcher, broadcasting/film/video in the Last Year:   . Barista in the Last Year:   Transportation Needs:   . Freight forwarder (Medical):   Marland Kitchen Lack of Transportation (Non-Medical):   Physical Activity:   . Days of Exercise per Week:   . Minutes of Exercise per Session:   Stress:   . Feeling of Stress :   Social Connections:   . Frequency of Communication with Friends and Family:   . Frequency of Social Gatherings with Friends and Family:   . Attends Religious Services:   . Active Member of Clubs or Organizations:   . Attends Banker Meetings:   Marland Kitchen Marital Status:   Intimate Partner Violence:   . Fear of Current or Ex-Partner:   . Emotionally Abused:   Marland Kitchen Physically Abused:   . Sexually Abused:     REVIEW OF SYSTEMS: Constitutional: No fevers, chills, or sweats, no generalized fatigue, change in appetite Eyes: No visual changes, double vision, eye pain Ear, nose and throat: No hearing loss, ear pain, nasal congestion, sore throat Cardiovascular: No chest pain,  palpitations Respiratory:  No shortness of breath at rest or with exertion, wheezes GastrointestinaI: No nausea, vomiting, diarrhea, abdominal pain, fecal incontinence Genitourinary:  No dysuria, urinary retention or frequency Musculoskeletal:  No neck pain, back pain Integumentary: No rash, pruritus, skin lesions Neurological: as above Psychiatric: No depression, insomnia, anxiety Endocrine: No palpitations, fatigue, diaphoresis, mood swings, change in appetite, change in weight, increased thirst Hematologic/Lymphatic:  No anemia, purpura, petechiae. Allergic/Immunologic: no itchy/runny eyes, nasal congestion, recent allergic reactions, rashes  PHYSICAL EXAM: Vitals:   04/05/20 0855  BP: 138/79  Pulse: (!) 58  Resp: 20  SpO2: 96%   General: No acute distress Head:  Normocephalic/atraumatic Skin/Extremities: No rash, no edema Neurological Exam: Mental status: alert and oriented to person, place, and time, no dysarthria or aphasia, Fund of knowledge is appropriate.  Recent  and remote memory are intact.  Attention and concentration are normal.   SLUMS score 27/30.  [employer] Mental Exam 04/05/2020  Weekday Correct 1  Current year 1  What state are we in? 1  Amount spent 1  Amount left 2  # of Animals 2  5 objects recall 3  Number series 2  Hour markers 2  Time correct 2  Placed X in triangle correctly 1  Largest Figure 1  Name of male 2  Date back to work 2  Type of work 2  State she lived in 2  Total score 27   Cranial nerves: CN I: not tested CN II: pupils equal, round and reactive to light, visual fields intact CN III, IV, VI:  full range of motion, no nystagmus, no ptosis CN V: facial sensation intact CN VII: upper and lower face symmetric CN VIII: hearing intact to conversation Bulk & Tone: normal, no fasciculations, no cogwheeling Motor: 5/5 throughout with no pronator drift. Sensation: intact to light touch, cold, pin, vibration and joint position  sense.  No extinction to double simultaneous stimulation.  Romberg test negative Deep Tendon Reflexes: +2 throughout, no ankle clonus Cerebellar: no incoordination on finger to nose testing Gait: able to rise with arms crossed over chest, gait arrow-based and steady with good arm swing, able to tandem walk adequately. Tremor: minimal action tremor when writing. No resting, postural or endpoint tremor seen.  No micrographia, good spiral drawing. No postural instability. Good finger taps and RAMs.   IMPRESSION: This is a pleasant 69 year old right-handed man with a history of hypertension, hyperlipidemia, diabetes, presenting for evaluation of worsening memory and personality changes. He was previously evaluated in 2019 with MOCA score of 29/30. MRI brain in 2012 showed mild to moderate chronic microvascular disease, mild diffuse atrophy. He is alone in the office today with no family to corroborate history, SLUMS score today 27/30. Neurological exam non-focal, there is minimal tremor, no parkinsonian signs. We discussed repeating MRI brain with and without contrast to assess for underlying structural abnormality, and Neuropsychological testing to further evaluate memory concerns to help guide long-term management. Continue Donepezil 5mg  daily and Sertraline 50mg  daily. We discussed the importance of control of vascular risk factors, physical exercise, and brain stimulation exercises for brain health. Follow-up in 6 months, he knows to call for any changes.   Thank you for allowing me to participate in the care of this patient. Please do not hesitate to call for any questions or concerns.   , M.D.  CC: Dr. 

## 2020-04-07 ENCOUNTER — Ambulatory Visit (INDEPENDENT_AMBULATORY_CARE_PROVIDER_SITE_OTHER): Payer: Medicare Other | Admitting: Cardiology

## 2020-04-07 ENCOUNTER — Other Ambulatory Visit: Payer: Self-pay

## 2020-04-07 ENCOUNTER — Encounter: Payer: Self-pay | Admitting: Cardiology

## 2020-04-07 VITALS — BP 130/60 | HR 80 | Ht 72.0 in | Wt 217.0 lb

## 2020-04-07 DIAGNOSIS — I10 Essential (primary) hypertension: Secondary | ICD-10-CM

## 2020-04-07 DIAGNOSIS — E782 Mixed hyperlipidemia: Secondary | ICD-10-CM | POA: Diagnosis not present

## 2020-04-07 DIAGNOSIS — E119 Type 2 diabetes mellitus without complications: Secondary | ICD-10-CM

## 2020-04-07 DIAGNOSIS — R001 Bradycardia, unspecified: Secondary | ICD-10-CM

## 2020-04-07 DIAGNOSIS — R413 Other amnesia: Secondary | ICD-10-CM

## 2020-04-07 NOTE — Patient Instructions (Signed)
Medication Instructions:  No medication changes *If you need a refill on your cardiac medications before your next appointment, please call your pharmacy*   Lab Work: None ordered If you have labs (blood work) drawn today and your tests are completely normal, you will receive your results only by: . MyChart Message (if you have MyChart) OR . A paper copy in the mail If you have any lab test that is abnormal or we need to change your treatment, we will call you to review the results.   Testing/Procedures: None ordered   Follow-Up: At [facility], you and your health needs are our priority.  As part of our continuing mission to provide you with exceptional heart care, we have created designated Provider Care Teams.  These Care Teams include your primary Cardiologist (physician) and Advanced Practice Providers (APPs -  Physician Assistants and Nurse Practitioners) who all work together to provide you with the care you need, when you need it.  We recommend signing up for the patient portal called "MyChart".  Sign up information is provided on this After Visit Summary.  MyChart is used to connect with patients for Virtual Visits (Telemedicine).  Patients are able to view lab/test results, encounter notes, upcoming appointments, etc.  Non-urgent messages can be sent to your provider as well.   To learn more about what you can do with MyChart, go to [email or web address].    Your next appointment:   9 month(s)  The format for your next appointment:   In Person  Provider:   Rajan Revankar, MD   Other Instructions NA 

## 2020-04-07 NOTE — Progress Notes (Signed)
Cardiology Office Note:    Date:  04/07/2020   ID:  Joseph Lamb, DOB [birth year], MRN [id number]  PCP:  Lupita Raider, MD  Cardiologist:  Garwin Brothers, MD   Referring MD: Lupita Raider, MD    ASSESSMENT:    1. Essential hypertension   2. Mixed hyperlipidemia   3. Sinus bradycardia   4. Type 2 diabetes mellitus without complication, without long-term current use of insulin (HCC)   5. Memory loss    PLAN:    In order of problems listed above:  1. Primary prevention stressed with the patient.  Importance of compliance with diet medication stressed and he vocalized understanding. 2. Essential hypertension: Blood pressure stable and patient has a good diet plan and exercise. 3. Sinus bradycardia: The rhythm is fine and event monitor report was discussed with the patient 4. Mixed dyslipidemia: Diet emphasized and he promises to comply.  He is walking about a mile a Lamb on a daily basis.  He still has ongoing memory issues for which she is seeing specialist. 5. Patient will be seen in follow-up appointment in 9 months or earlier if the patient has any concerns    Medication Adjustments/Labs and Tests Ordered: Current medicines are reviewed at length with the patient today.  Concerns regarding medicines are outlined above.  No orders of the defined types were placed in this encounter.  No orders of the defined types were placed in this encounter.    No chief complaint on file.    History of Present Illness:    Joseph Lamb is a 69 y.o. male.  Patient has past medical history of essential hypertension and dyslipidemia and diabetes mellitus.  He denies any problems at this time and takes care of activities of daily living.  No chest pain orthopnea or PND.  At the time of my evaluation, the patient is alert awake oriented and in no distress.  Past Medical History:  Diagnosis Date  . Arrhythmia   . Diabetes mellitus type 2, uncomplicated (HCC) 06/14/2015  .  Essential hypertension 06/14/2015  . High cholesterol   . Hyperlipidemia 06/14/2015  . Hypertension   . Memory loss 06/14/2015  . Pre-diabetes     Past Surgical History:  Procedure Laterality Date  . PROSTATE SURGERY      Current Medications: Current Meds  Medication Sig  . amLODipine (NORVASC) 5 MG tablet Take 1 tablet (5 mg total) by mouth daily.  Marland Kitchen aspirin EC 81 MG tablet Take 1 tablet (81 mg total) by mouth daily.  Marland Kitchen donepezil (ARICEPT) 5 MG tablet Take 5 mg by mouth at bedtime.  . hydrochlorothiazide (HYDRODIURIL) 25 MG tablet Take 25 mg by mouth every morning.  . latanoprost (XALATAN) 0.005 % ophthalmic solution 1 drop at bedtime.  . pravastatin (PRAVACHOL) 10 MG tablet Take 10 mg by mouth daily.  . sertraline (ZOLOFT) 50 MG tablet Take 50 mg by mouth daily.  . tadalafil (CIALIS) 20 MG tablet Take 20 mg by mouth daily as needed.  . TRULICITY 1.5 MG/0.5ML SOPN Inject 1.5 mg into the skin once a week.     Allergies:   Lisinopril, Crestor [rosuvastatin calcium], and Simvastatin   Social History   Socioeconomic History  . Marital status: Married    Spouse name: Not on file  . Number of children: 2  . Years of education: 57  . Highest education level: Not on file  Occupational History  . Occupation: Retired  Tobacco Use  . Smoking status: Never  Smoker  . Smokeless tobacco: Never Used  Vaping Use  . Vaping Use: Never used  Substance and Sexual Activity  . Alcohol use: Yes    Alcohol/week: 0.0 standard drinks    Comment: rare  . Drug use: No  . Sexual activity: Not on file  Other Topics Concern  . Not on file  Social History Narrative   Right handed   One story home   Drinks no caffeine, occ soda.   Social Determinants of Health   Financial Resource Strain:   . Difficulty of Paying Living Expenses:   Food Insecurity:   . Worried About Programme researcher, broadcasting/film/video in the Last Year:   . Barista in the Last Year:   Transportation Needs:   . Freight forwarder  (Medical):   Marland Kitchen Lack of Transportation (Non-Medical):   Physical Activity:   . Days of Exercise per Week:   . Minutes of Exercise per Session:   Stress:   . Feeling of Stress :   Social Connections:   . Frequency of Communication with Friends and Family:   . Frequency of Social Gatherings with Friends and Family:   . Attends Religious Services:   . Active Member of Clubs or Organizations:   . Attends Banker Meetings:   Marland Kitchen Marital Status:      Family History: The patient's family history includes Diabetes in his father and mother.  ROS:   Please see the history of present illness.    All other systems reviewed and are negative.  EKGs/Labs/Other Studies Reviewed:    The following studies were reviewed today:  IMPRESSIONS  1. Left ventricular ejection fraction, by estimation, is 60 to 65%. The  left ventricle has normal function. The left ventricle has no regional  wall motion abnormalities. There is mild left ventricular hypertrophy.  Left ventricular diastolic parameters  were normal.  2. Left atrial size was mildly dilated.  3. The mitral valve is normal in structure. Mild mitral valve  regurgitation. No evidence of mitral stenosis.    EVENT MONITOR REPORT:   Patient was monitored from 03/08/2020 to 03/10/2020. Indication:                    Bradycardia Ordering physician:  Garwin Brothers, MD  Referring physician:        Garwin Brothers, MD    Baseline rhythm: NS  Minimum heart rate: 36 BPM.  Average heart rate: 54 BPM.  Maximal heart rate 88 BPM.  Atrial arrhythmia: None significant  Ventricular arrhythmia: None significant.  Rare PVCs  Conduction abnormality: None significant  Symptoms: None significant   Conclusion:  Overall unremarkable monitor.  Asymptomatic bradycardia.  Significant bradycardia during sleep hours.  Clinical correlation suggested.  Interpreting  cardiologist: Garwin Brothers, MD  Date: 04/04/2020 12:49  PM     Recent Labs: No results found for requested labs within last 8760 hours.  Recent Lipid Panel No results found for: CHOL, TRIG, HDL, CHOLHDL, VLDL, LDLCALC, LDLDIRECT  Physical Exam:    VS:  BP 130/60   Pulse 80   Ht 6' (1.829 m)   Wt 217 lb (98.4 kg)   SpO2 98%   BMI 29.43 kg/m     Wt Readings from Last 3 Encounters:  04/07/20 217 lb (98.4 kg)  04/05/20 217 lb (98.4 kg)  03/08/20 216 lb (98 kg)     GEN: Patient is in no acute distress HEENT: Normal NECK: No JVD;  No carotid bruits LYMPHATICS: No lymphadenopathy CARDIAC: Hear sounds regular, 2/6 systolic murmur at the apex. RESPIRATORY:  Clear to auscultation without rales, wheezing or rhonchi  ABDOMEN: Soft, non-tender, non-distended MUSCULOSKELETAL:  No edema; No deformity  SKIN: Warm and dry NEUROLOGIC:  Alert and oriented x 3 PSYCHIATRIC:  Normal affect   Signed, Garwin Brothers, MD  04/07/2020 1:35 PM    [facility]

## 2020-05-08 ENCOUNTER — Ambulatory Visit (INDEPENDENT_AMBULATORY_CARE_PROVIDER_SITE_OTHER): Payer: Medicare Other | Admitting: Counselor

## 2020-05-08 ENCOUNTER — Other Ambulatory Visit: Payer: Self-pay

## 2020-05-08 ENCOUNTER — Encounter: Payer: Self-pay | Admitting: Counselor

## 2020-05-08 ENCOUNTER — Ambulatory Visit: Payer: Medicare Other

## 2020-05-08 DIAGNOSIS — F09 Unspecified mental disorder due to known physiological condition: Secondary | ICD-10-CM

## 2020-05-08 DIAGNOSIS — I679 Cerebrovascular disease, unspecified: Secondary | ICD-10-CM

## 2020-05-08 NOTE — Progress Notes (Signed)
   Psychometrist Note   Cognitive testing was administered to [employer] by Lamar Benes, B.S. (Technician) under the supervision of Alphonzo Severance, Psy.D., ABN. Joseph Lamb was able to tolerate all test procedures. Dr. Nicole Kindred met with the patient as needed to manage any emotional reactions to the testing procedures. Rest breaks were offered.    The battery of tests administered was selected by Dr. Nicole Kindred with consideration to the patient's current level of functioning, the nature of his symptoms, emotional and behavioral responses during the interview, level of literacy, observed level of motivation/effort, and the nature of the referral question. This battery was communicated to the psychometrist. Communication between Dr. Nicole Kindred and the psychometrist was ongoing throughout the evaluation and Dr. Nicole Kindred was immediately accessible at all times. Dr. Nicole Kindred provided supervision to the technician on the date of this service, to the extent necessary to assure the quality of all services provided.    Joseph Lamb will return in approximately one week for an interactive feedback session with Dr. Nicole Kindred, at which time male test performance, clinical impressions, and treatment recommendations will be reviewed in detail. The patient understands he can contact our office should he require our assistance before this time.   A total of 135 minutes of billable time were spent with Joseph Lamb by the technician, including test administration and scoring time. Billing for these services is reflected in Dr. Les Pou note.   This note reflects time spent with the psychometrician and does not include test scores, clinical history, or any interpretations made by Dr. Nicole Kindred. The full report will follow in a separate note.

## 2020-05-08 NOTE — Progress Notes (Signed)
NEUROPSYCHOLOGICAL EVALUATION [facility]  Patient Name: MAYSON MCNEISH MRN: [id number] Date of Birth: Apr 21, 1951 Age: 69 y.o. Education: 15 years  Referral Circumstances and Background Information  Mr. Maximos Zayas is a 69 y.o., right-hand dominant, married man with a history of HLD, HTN, DM2, and subjective memory loss that started around 2010. He was evaluated by Dr. Marjory Lies in 2013 and had a 28/30 on the MoCA but nevertheless began donepezil at that time. He was evaluated within our practice for similar concerns in 2016 and achieved a MoCA of 29/30 at that time. He was recently seen by Dr. Karel Jarvis on 03/2020 and his wife reported decline in the interval, involving memory and behavior, although once again, he was screening essentially in the normal range (SLUMS = 27/30). He was referred for neuropsychological evaluation in the service of clarifying his current status.   On interview, the patient thinks it is "possible" that he has some issues with memory and thinking although he doesn't present as particularly concerned. He does admit that he misplaces things frequently. On interview, the patient's wife stated that she has been concerned "for years" but she thinks that it is getting worse particularly over the past year. There is some conflict between them that is coloring the history. The patient described himself as very laid back and his wife is more of a "type A" personality and clearly that results in differences of opinion about how things should be done. He will forget to do things that she asks him to do, when he goes grocery shopping he won't get everything on the list, and he misplaces things frequently. On review of symptoms, his wife acknowledged that he does repeat himself and will forget things that she says, and he thinks that he has told her things when he hasn't. They haven't noticed any difficulties with orientation. With respect to judgment and problem solving, she  feels as though there have been changes, and he often defers to her (the patient says that it has been that way for 38 years). She notices this when he is building things, although from her description it's not clear how significant it is. The patient reported that his mood is generally fairly good, although he is taking Zoloft because his wife and son "badgered him" into taking it. His wife thinks it will give him more energy to be more active because his activity level is too low. He described his energy as "6 or 7" on a 1 to 10 scale. He reported that he is sleeping well, usually 6-8 hours a night. He has lost a bit of weight although he is still a healthy weight.   With respect to functioning, it is not clear that there is anything the patient cannot do although he doesn't do some things his wife would like him to (he procrastinates). This often involves household projects and other such things. The patient's wife manages the finances and always has. He continues to manage his own medications and stated that he takes them reliably, although his wife is saying that he doesn't take them often for 2 to 3 days at a time. He does projects around the house, like building things, and it sounds like he is at a reasonable level of skill although his wife said that she is no longer comfortable with him using power tools on his own. She thinks he would hurt himself if she wasn't around. He does some cooking and other chores around the house. He  is able to go grocery shopping and use the community as needed. There are no reported problems with driving, he remains reliable behind the wheel.   Past Medical History and Review of Relevant Studies   Patient Active Problem List   Diagnosis Date Noted  . Dizzy spells 03/08/2020  . Sinus bradycardia 03/08/2020  . Memory loss 06/14/2015  . Essential hypertension 06/14/2015  . Hyperlipidemia 06/14/2015  . 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 Volume appears within normal limits for age. He has an MRI scheduled with radiology tomorrow.   Current Outpatient Medications  Medication Sig Dispense Refill  . amLODipine (NORVASC) 5 MG tablet Take 1 tablet (5 mg total) by mouth daily. 180 tablet 3  . aspirin EC 81 MG tablet Take 1 tablet (81 mg total) by mouth daily. 90 tablet 3  . donepezil (ARICEPT) 5 MG tablet Take 5 mg by mouth at bedtime.    . hydrochlorothiazide (HYDRODIURIL) 25 MG tablet Take 25 mg by mouth every morning.    . latanoprost (XALATAN) 0.005 % ophthalmic solution 1 drop at bedtime.    . pravastatin (PRAVACHOL) 10 MG tablet Take 10 mg by mouth daily.    . sertraline (ZOLOFT) 50 MG tablet Take 50 mg by mouth daily.    . tadalafil (CIALIS) 20 MG tablet Take 20 mg by mouth daily as needed.    . TRULICITY 1.5 MG/0.5ML SOPN Inject 1.5 mg into the skin once a week.  0   No current facility-administered medications for this visit.   Family History  Problem Relation Age of Onset  . Diabetes Mother   . Diabetes Father    There is a family history of dementia, his maternal grandmother had it and two of his aunts on that side of the family as per his wife (he thought it was his great grandmother). There is a family history of psychiatric illness, a paternal aunt was committed many years ago. He also has a half sister who has autism.   Psychosocial History  Developmental, Educational and Employment History: The patient reported a normal childhood development and denied any abuse or neglect. He stated that he did "pretty well" in school "when I was there," he didn't go as much as he should. He denied ever being held back or having any learning problems. He went to [employer] and studied  engineering and reported completing the equivalent of 3 years but he did not earn a degree. He did earn an Scientist, research (physical sciences) from [employer] in [place]. 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   Psychiatric History: The patient denied any history of mental health treatment, he reported that he has had some issues with depression throughout his life but has worked through them on his own.   Substance Use History: The patient doesn't consume alcohol, he has never been a smoker, and he doesn't use any drugs.   Relationship History and Living Cimcumstances: The patient and his wife have been married 36 years. He has two sons, one from a previous  marriage, his 71 year old son lives with them.   Mental Status and Behavioral Observations  Sensorium/Arousal: The patient's level of arousal was awake and alert. Hearing and vision were adequate with correction (used glasses) for testing purposes. Orientation: The patient was fully oriented to person, place, time, and situation.  Appearance: Dressed in appropriate, casual clothing with reasonable grooming and hygiene.  Behavior: Appeared to be adequately engaged in the testing and was participatory in the interview Speech/language: Soft spoke but speech was otherwise normal in rate, rhythm, and volume with no word finding pauses or paraphasic errors Gait/Posture: Gait appeared normal on ambulation within the clinic.  Movement: The patient did appear to have a slight bilateral tremor visible in the BUE on motor testing. It did not seem to interfere much with his motor performance Social Comportment: Appropriate to the topics discussed Mood: "Good" Affect: Mainly euthymic Thought process/content: Thought process was logical, linear and goal-directed. He presented as a  fairly reliable historian Safety: No thoughts of harming self or others on direct questioning Insight: Christin BachFair  Montreal Cognitive Assessment  05/08/2020 06/14/2015  Visuospatial/ Executive (0/5) 4 5  Naming (0/3) 3 3  Attention: Read list of digits (0/2) 2 2  Attention: Read list of letters (0/1) 1 1  Attention: Serial 7 subtraction starting at 100 (0/3) 3 3  Language: Repeat phrase (0/2) 1 2  Language : Fluency (0/1) 0 1  Abstraction (0/2) 2 2  Delayed Recall (0/5) 4 4  Orientation (0/6) 6 6  Total 26 29  Adjusted Score (based on education) 26 29   Test Procedures  Wide Range Achievement Test - 4             Word Reading Thad Rangereynolds' Intellectual Screening Test Neuropsychological Assessment Battery  Memory Module  Naming  Digit Span Repeatable Battery for the Assessment of Neuropsychological Status (Form A)  Figure Copy  Judgment of Line Orientation  Coding  Figure Recall The Dot Counting Test A Random Letter Test Controlled Oral Word Association (F-A-S) Semantic Fluency (Animals) Trail Making Test A & B Complex Ideational Material Modified [place] Card Sorting Test Geriatric Depression Scale - Short Form Quick Dementia Rating System (completed by wife [name])  Plan  Larose KellsJames L Jutte was seen for a psychiatric diagnostic evaluation and neuropsychological testing. There have been concerns about his cognition that came to medical attention nearly a decade ago although cognitive screening shows him to be performing in the normal range with minimal to no decline over the past 8 years. His wife is worried about his functioning but he thinks he is doing ok. Neuropsychological testing will be helpful to provide them with objective information about the presence of any cognitive difficulties and inform prognosis/treatment. My sense is he is at no more than an MCI level of dysfunction, at most, and it's not clear there is much impairment day-to-day. Some of the symptoms his wife reports  may be due to differences of opinion regarding how to approach things. Nevertheless, he did have a fair amount of vascular disease on his previous scan and his MoCA is just a bit lower today than in 2016. Full and complete note with impressions, recommendations, and interpretation of test data to follow.   Bettye BoeckPeter V. Roseanne RenoStewart, PsyD, ABN Clinical Neuropsychologist  Informed Consent and Coding/Compliance  Risks and benefits of the evaluation were discussed with the patient prior to all testing procedures. I conducted a clinical interview and neuropsychological testing (at least two tests) with Larose KellsJames L Idris and Clare CharonKimberly Shuttleworth, B.S. (  Technician) assisted me in administering additional test procedures. The patient was able to tolerate the testing procedures and the patient (and/or family if applicable) is likely to benefit from further follow up to receive the diagnosis and treatment recommendations, which will be rendered at the next encounter. Billing below reflects technician time, my direct face-to-face time with the patient, time spent in test administration, and time spent in professional activities including but not limited to: neuropsychological test interpretation, integration of neuropsychological test data with clinical history, report preparation, treatment planning, care coordination, and review of diagnostically pertinent medical history or studies.   Services associated with this encounter: Clinical Interview [phone]) plus 60 minutes (58832; Neuropsychological Evaluation by Professional)  120 minutes (54982; Neuropsychological Evaluation by Professional, Adl.) 20 minutes (64158; Test Administration by Professional) 30 minutes (30940; Neuropsychological Testing by Technician) 105 minutes (76808; Neuropsychological Testing by Technician, Adl.)

## 2020-05-08 NOTE — Progress Notes (Signed)
NEUROPSYCHOLOGICAL TEST SCORES [facility]  Patient Name: Joseph Lamb MRN: [id number] Date of Birth: [birth year] Age: 69 y.o. Education: 14 years  Measurement properties of test scores: IQ, Index, and Standard Scores (SS): Mean = 100; Standard Deviation = 15 Scaled Scores (Ss): Mean = 10; Standard Deviation = 3 Z scores (Z): Mean = 0; Standard Deviation = 1 T scores (T); Mean = 50; Standard Deviation = 10  TEST SCORES:    Note: This summary of test scores accompanies the interpretive report and should not be interpreted by unqualified individuals or in isolation without reference to the report. Test scores are relative to age, gender, and educational history as available and appropriate.   Performance Validity        "A" Random Letter Test Raw  Descriptor      Errors 0 Within Expectation  The Dot Counting Test: 13 Within Expectation      Mental Status Screening     Total Score Descriptor  MoCA 26 Normal      Expected Functioning        Wide Range Achievement Test: Standard/Scaled Score Percentile      Word Reading 91 27      Reynolds Intellectual Screening Test Standard/T-score Percentile      Guess What 61 86      Odd Item Out 58 79  RIST Index 118 88      Attention/Processing Speed        Neuropsychological Assessment Battery (Attention Module, Form 1): Scaled/T-score Percentile     Digits Forward 48 42     Digits Backwards 49 46      Repeatable Battery for the Assessment of Neuropsychological Status (Form A): Standard Score Percentile     Coding 3 1      Language        Neuropsychological Assessment Battery (Language Module, Form 1): T-score Percentile      Naming   (30) 57 75      Verbal Fluency:  T Score Percentile      Controlled Oral Word Association (F-A-S) 41 18      Semantic Fluency (Animals) 55 69      Memory:        Neuropsychological Assessment Battery (Memory Module, Form 1): T-score/Standard Score Percentile  Memory Index (MEM):  89 23      List Learning           List A Immediate Recall   (4 , 6 , 7) 43 25         List B Immediate Recall   (4) 52 58         List A Short Delayed Recall   (4) 39 14         List A Long Delayed Recall   (3) 36 8         List A Percent Retention   (75 %) --- 31         List A Long Delayed Yes/No Recognition Hits   (10) --- 21         List A Long Delayed Yes/No Recognition False Alarms   (4) --- 50         List A Recognition Discriminability Index --- 42      Shape Learning           Immediate Recognition   (4 , 7 , 7) 60 84         Delayed Recognition   (7) 62 88  Percent Retention   (100 %) --- 50         Delayed Forced-Choice Recognition Hits   (7) --- 25         Delayed Forced-Choice Recognition False Alarms   (0) --- 75         Delayed Forced-Choice Recognition Discriminability --- 58     Story Learning           Immediate Recall   (20, 28) 42 21         Delayed Recall   (26) 44 27         Percent Retention   (93 %) --- 50      Daily Living Memory            Immediate Recall   (21, 13) 40 16          Delayed Recall   (6, 5) 43 25          Percent Retention (79 %) --- 34          Recognition Hits   (8) --- 27      Repeatable Battery for the Assessment of Neuropsychological Status (Form A): Scaled Score Percentile         Figure Recall   (18) 14 91      Visuospatial/Constructional Functioning        Repeatable Battery for the Assessment of Neuropsychological Status (Form A): Standard/Scaled Score Percentile      Visuospatial/Constructional Index 121 92         Figure Copy   (20) 14 91         Judgment of Line Orientation   (18) --- 51-75      Executive Functioning        Modified [employer] Test (MWCST): Standard/T-Score Percentile      Number of Categories Correct 38 12      Number of Perseverative Errors 67 96      Number of Total Errors 46 34      Percent Perseverative Errors 66 95  Executive Function Composite 104 61      Trail Making Test:  T-Score Percentile      Part A 47 38      Part B 49 46      Boston Diagnostic Aphasia Exam: Raw Score Scaled Score      Complex Ideational Material 11 9      Clock Drawing Raw Score Descriptor      Command 8 Borderline Impairment      Rating Scales         Raw Score Descriptor  Patient Health Questionnaire - 9 1 Within Normal Limits  GAD-7 0 Minimal      Clinical Dementia Rating Raw Score Descriptor      Sum of Boxes 1.0 Mild Cognitive Impairment      Global Score 0.5 MCI      Quick Dementia Rating System Raw Score Descriptor      Sum of Boxes 1.5 MCI      Total Score 3 MCI  Geriatric Depression Scale - Short Form 2 Negative   Syrena Burges V. Roseanne Reno PsyD, ABN Clinical Neuropsychologist

## 2020-05-09 ENCOUNTER — Telehealth: Payer: Self-pay

## 2020-05-09 ENCOUNTER — Ambulatory Visit
Admission: RE | Admit: 2020-05-09 | Discharge: 2020-05-09 | Disposition: A | Discharge status: Home / Self Care | Payer: Medicare Other | Source: Ambulatory Visit | Attending: Neurology | Admitting: Neurology

## 2020-05-09 DIAGNOSIS — R413 Other amnesia: Secondary | ICD-10-CM

## 2020-05-09 MED ORDER — GADOBENATE DIMEGLUMINE 529 MG/ML IV SOLN
20.0000 mL | Freq: Once | INTRAVENOUS | Status: AC | PRN
  Administered 2020-05-09: 20 mL via INTRAVENOUS

## 2020-05-09 NOTE — Telephone Encounter (Signed)
-----   Message from Karen M Aquino, MD sent at 05/09/2020 12:40 PM EDT ----- Pls let him know MRI brain did not show any evidence of tumor, stroke, or bleed. It showed hardening of the small blood vessels in the brain that is common in patients with blood pressure, cholesterol, diabetes issues. Very important to continue control of these conditions. Proceed with memory testing as scheduled, thanks 

## 2020-05-09 NOTE — Telephone Encounter (Signed)
-----   Message from Van Clines, MD sent at 05/09/2020 12:40 PM EDT ----- Pls let him know MRI brain did not show any evidence of tumor, stroke, or bleed. It showed hardening of the small blood vessels in the brain that is common in patients with blood pressure, cholesterol, diabetes issues. Very important to continue control of these conditions. Proceed with memory testing as scheduled, thanks

## 2020-05-18 ENCOUNTER — Encounter: Payer: Self-pay | Admitting: Counselor

## 2020-05-18 ENCOUNTER — Ambulatory Visit (INDEPENDENT_AMBULATORY_CARE_PROVIDER_SITE_OTHER): Payer: Medicare Other | Admitting: Counselor

## 2020-05-18 ENCOUNTER — Other Ambulatory Visit: Payer: Self-pay

## 2020-05-18 DIAGNOSIS — F0150 Vascular dementia without behavioral disturbance: Secondary | ICD-10-CM

## 2020-05-18 DIAGNOSIS — F01A0 Vascular dementia, mild, without behavioral disturbance, psychotic disturbance, mood disturbance, and anxiety: Secondary | ICD-10-CM

## 2020-05-18 NOTE — Progress Notes (Signed)
NEUROPSYCHOLOGICAL EVALUATION  Neurology  Patient Name: Joseph Lamb MRN: [id number] Date of Birth: 1951-06-06 Age: 69 y.o. Education: 15 years  Clinical Impressions  Joseph Lamb is a 69 y.o., right-hand dominant, married man with a history of HTN, HLD, DM2, and subjective memory loss that started around 2010 with significant worsening over the past year as per his wife. The patient and his wife admit that they have some longstanding differences of opinion about things and that certainly colored the history. They are reporting minor difficulties such as trouble remembering details, misplacing things, and other behavioral "changes" such as apathy but these may be simply reflect differences in preferences. He is still able to do everything that he was previously and does not need help with any iADLs. 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 Since his initial appointment, he had an updated scan, that shows clear progression of those areas with now moderate to severe confluent disease. There is some decrease in overall brain volume but that is expected over nearly a decade and may also be due to his vascular disease.   On neuropsychological assessment, Joseph Lamb demonstrated high average expected ability and performance on memory measures falling slightly below expectations given that level of functioning. The profile shows encoding and retrieval difficulties (mainly on verbal measures) and at this point, his difficulties are quite mild, given low average overall performance. Apart from that he had difficulty on a measure of processing efficiency involving timed number-symbol coding and a weak scores on select measures of executive function while performing within normal limits on several others. He has no naming problems and did well on measures of visuospatial abilities and attention/working memory.  He screened negative for the presence of depression and his wife rated him as functioning more at an MCI than a mild dementia level of function.   Joseph Lamb is thus demonstrating a mild cognitive impairment level problem with his test performance, serial neuroimaging, and presentation arguing in favor of a vascular etiology. Premorbid strengths and weaknesses may also be contributory to his test performance but in the context of his history, I do think this is a decline for him. His issues are quite mild and I think that longstanding differences of opinion/conflict between him and his wife are coloring family report of symptoms. There are no indications of a mood disorder. Neuropsychiatric difficulties including apathy are common in vascular disease though it is not clear than his apathy is clearly neuropsychiatric or pathological in character and may simply reflect preferences for his activity level in retirement.   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 The pattern of low scores, however, is not particularly concerning for patterns that I tend to associate with the most common cause of age related decline (namely, Alzheimer's disease) and instead goes well with the type of problems people can have in the setting of vascular changes. 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   The major difference between mild cognitive impairment (  MCI) and dementia is in  severity and potential prognosis. Once someone reaches a level of severity adequate to be diagnosed with a dementia, there is usually progression over time, though this may be years. On the other hand, mild cognitive impairment, while a significant risk for dementia in future, does not always progress to dementia, and in some instances stays the same or can even revert to normal. It is important to realize that if MCI is due to underlying Alzheimer's disease, it will most likely progress to dementia eventually. The rate of conversion to Alzheimer's dementia from amnestic MCI is about 15% per year versus the general population risk of conversion of 2% per year.   Cerebrovascular disease is a major contributor to cognitive decline. Most people are familiar with stroke, which involves acute disruptions in blood flow to the brain that result in damage to underlying brain tissue. Small vessel ischemic disease is less well known but is more common and can be equally problematic. Small vessel ischemic disease involves wear and tear on small blood vessels in the brain over time and increases with age, affecting nearly 100% of people in those older than 90 years. Small vessel ischemic disease increases risk for dementia and has been associated with Alzheimer's dementia in some studies. The best way to minimize cerebrovascular risk is to actively manage risk factors, including hypertension and high cholesterol. Medications can be helpful but maintaining a healthy body weight, getting regular exercise, and eating a heart-healthy diet such as the MIND diet or DASH diet are also crucially important.   There is now good quality evidence from at least one large scale study that a modified mediterranean diet may help slow cognitive decline. This is known as the "MIND" diet. The Mind diet is not so much a specific diet as it is a set of recommendations for things that you should and should not eat.   Foods that are ENCOURAGED  on the MIND Diet:  Green, leafy vegetables: Aim for six or more servings per week. This includes kale, spinach, cooked greens and salads.  All other vegetables: Try to eat another vegetable in addition to the green leafy vegetables at least once a day. It is best to choose non-starchy vegetables because they have a lot of nutrients with a low number of calories.  Berries: Eat berries at least twice a week. There is a plethora of research on strawberries, and other berries such as blueberries, raspberries and blackberries have also been found to have antioxidant and brain health benefits.  Nuts: Try to get five servings of nuts or more each week. The creators of the MIND diet don't specify what kind of nuts to consume, but it is probably best to vary the type of nuts you eat to obtain a variety of nutrients. Peanuts are a legume and do not fall into this category.  Olive oil: Use olive oil as your main cooking oil. There may be other heart-healthy alternatives such as algae oil, though there is not yet sufficient research upon which to base a formal recommendation.  Whole grains: Aim for at least three servings daily. Choose minimally processed grains like oatmeal, quinoa, brown rice, whole-wheat pasta and 100% whole-wheat bread.  Fish: Eat fish at least once a week. It is best to choose fatty fish like salmon, sardines, trout, tuna and mackerel for their high amounts of omega-3 fatty acids.  Beans: Include beans in at least four meals every week. This includes all beans, lentils and soybeans.  Poultry: Try to eat chicken or [place] at least twice a week. Note that fried chicken is not encouraged on the MIND diet.  Wine: Aim for no more than one glass of alcohol daily. Both red and white wine may benefit the brain. However, much research has focused on the red wine compound resveratrol, which may help protect against Alzheimer's disease.  Foods that are DISCOURAGED on the MIND Diet: Butter and margarine:  Try to eat less than 1 tablespoon (about 14 grams) daily. Instead, try using olive oil as your primary cooking fat, and dipping your bread in olive oil with herbs.  Cheese: The MIND diet recommends limiting your cheese consumption to less than once per week.  Red meat: Aim for no more than three servings each week. This includes all beef, pork, lamb and products made from these meats.  Foy [place] food: The MIND diet highly discourages fried food, especially the kind from fast-food restaurants. Limit your consumption to less than once per week.  Pastries and sweets: This includes most of the processed junk food and desserts you can think of. Ice cream, cookies, brownies, snack cakes, donuts, candy and more. Try to limit these to no more than four times a week.  Exercise is one of the best medicines for promoting health and maintaining cognitive fitness at all stages in life. Exercise probably has the largest documented effect on brain health and performance of any lifestyle intervention. Studies have shown that even previously sedentary individuals who start exercising as late as age 71 show a significant survival benefit as compared to their non-exercising peers. In the [place], the current guidelines are for 30 minutes of moderate exercise per day, but increasing your activity level less than that may also be helpful. You do not have to get your 30 minutes of exercise in one shot and exercising for short periods of time spread throughout the day can be helpful. Go for several walks, learn to dance, or do something else you enjoy that gets your body moving. Of course, if you have an underlying medical condition or there is any question about whether it is safe for you to exercise, you should consult a medical treatment provider prior to beginning exercise.   Reevaluation in 1 to 2 years could be considered if you notice any further changes or would like an updated assessment of your abilities. We now have a  strong baseline upon which to compare your performance in the future.   Test Findings  Test scores are summarized in additional documentation associated with this encounter. Test scores are relative to age, gender, and educational history as available and appropriate. There were no concerns about performance validity as all findings fell within normal expectations.   General Intellectual Functioning/Achievement:  Performance on single word reading was toward the low end of the average range. By contrast, he performed within the high average range on the RIST index with comparable high average scores on there verbally and visually oriented portions of the test. High average presents as the best standard of comparison given that this measure is viewed as more robust.   Attention and Processing Efficiency: Performance was normal on measures of attention and working memory with average scores for digit repetition forward and backward.   Processing efficiency presented as somewhat lower than expected with extremely low timed number-symbol coding. He did better on simple numeric sequencing, although that is a very easy test.   Language: Language findings were within gross limits of normal although  somewhat weak phonemic fluency is noted, with a low average score on timed generation of words in response to the letters F-A-S. Visual object confrontation naming was within normal limits and generation of animals in one minute was average.   Visuospatial Function: Performance was very good on visuospatial and constructional measures with near errorless scores and superior index level performance. Copy of a line drawing was errorless, generating a superior score. Judgment of angular line orientations was average to high average.   Learning and Memory: Performance on measures of learning and memory fell just below expectations on the basis of Joseph Lamb's RIST index, with the discrepancy between his overall  memory index score and RIST score expected in less than 5% of normal individuals. The number of low subtest scores is also unusual. The pattern shows mainly difficulties with memory encoding and retrieval on verbally mediated tasks and good preservation of memory for visual information. While premorbid strengths and weaknesses may be contributory, I do think this likely reflects some decline for him.   In the verbal realm, immediate recall for a 12-item word list was low average at 4, 6, and 7 words across three learning trials. He then forgot many of those works, recalling 4 on short delayed recall, which is low average and suggestive of retroactive interference. He recalled only 3 words on long delayed recall, which is unusually low but then his performance improved to the average range when he was provided with recognition cues. Memory for a short story was low average on immediate recall and average on delayed recall. Memory for brief daily living-type information was low average on immediate and delayed recall with comparable recognition of the information using a multiple choice format.   In the visual realm, Joseph Lamb demonstrated very good high average range immediate recognition for a series of designs that are difficult to verbally encode. He recalled this information well, remembering 100% of what he had initially encoded, for a high average delayed recognition score. Recognition discriminability was average. Delayed free recall for a modestly complex figure stimulus was also good, falling at a superior level.   Executive Functions: Performance was good on most executive indicators although there were a couple of low scores. His overall performance on the Executive Function Composite of the Modified [employer] was average; however, his categories completed score was low average (almost unusually low). His generation of words in response to the letters F-A-S was low average.  Alternating sequencing of numbers and letters of the alphabet was average and clock drawing was "borderline impaired."  Rating Scale(s): Joseph Lamb screened negative for the presence of depression on a self-rating scale. His wife characterized him as functioning more at an MCI than a dementia level on the QDRS. I was able to rate a CDR for him and his global score is 0.5, which is mild cognitive impairment.   Joseph Lamb Joseph Reno PsyD, ABN Clinical Neuropsychologist

## 2020-05-18 NOTE — Progress Notes (Signed)
[facility]  Telemedicine statement:  I discussed the limitations of neuropsychological care via telemedicine and the availability of in person appointments. The patient expressed understanding and agreed to proceed. The patient was verified with two identifiers.  The visit modality was: telephonic The patient location was: home The provider location was: office  The following individuals participated: Floyde Parkins, II   Feedback Note: I met with [name] to review the findings resulting from his neuropsychological evaluation. Since the last appointment, he has been about the same. I took the opportunity to gather additional history with his son, who generally corroborated that he has noticed some subtle, mild changes in recalling details of things. He also feels like it takes his father a bit longer to process and respond to questions. Time was spent reviewing the impressions and recommendations that are detailed in the evaluation report. We discussed impression of very mild cognitive problems, likely due to vascular disease. His test profile is suggestive but I think there is a high level of confidence now given his scan, which clearly shows progression in the interval since 2012. I reviewed the importance of actively monitoring blod pressure, blood glucose, and adding in exercise and diet in addition to medications he is already taking. I took time to explain the findings and answer all the patient's questions. I encouraged Mr. Luckett to contact me should he have any further questions or if further follow up is desired.   Current Medications and Medical History   Current Outpatient Medications  Medication Sig Dispense Refill  . amLODipine (NORVASC) 5 MG tablet Take 1 tablet (5 mg total) by mouth daily. 180 tablet 3  . aspirin EC 81 MG tablet Take 1 tablet (81 mg total) by mouth daily. 90 tablet 3  . donepezil (ARICEPT)  5 MG tablet Take 5 mg by mouth at bedtime.    . hydrochlorothiazide (HYDRODIURIL) 25 MG tablet Take 25 mg by mouth every morning.    . latanoprost (XALATAN) 0.005 % ophthalmic solution 1 drop at bedtime.    . pravastatin (PRAVACHOL) 10 MG tablet Take 10 mg by mouth daily.    . sertraline (ZOLOFT) 50 MG tablet Take 50 mg by mouth daily.    . tadalafil (CIALIS) 20 MG tablet Take 20 mg by mouth daily as needed.    . TRULICITY 1.5 GL/8.7FI SOPN Inject 1.5 mg into the skin once a week.  0   No current facility-administered medications for this visit.    Patient Active Problem List   Diagnosis Date Noted  . Dizzy spells 03/08/2020  . Sinus bradycardia 03/08/2020  . Memory loss 06/14/2015  . Essential hypertension 06/14/2015  . Hyperlipidemia 06/14/2015  . Diabetes mellitus type 2, uncomplicated ([place]) 43/32/9518    Mental Status and Behavioral Observations  TAVARAS GOODY was available at the prespecified time for this telephonic appointment and was alert and generally oriented (orientation not formally assessed). Speech was normal in rate, rhythm, volume, and prosody. Self-reported mood was "good" and affect as assessed by vocal quality was euthymic. Thought process was logical, linear, and goal directed and thought content was to the topics discussed. There were no safety concerns identified at today's encounter, such as thoughts of harming self or others.   Plan  Feedback provided regarding the patient's neuropsychological evaluation. He presented as relieved that there are no major changes and understands that addressing risk factors and integrating diet and exercise into his routine are the best  ways to avoid any subsequent decline. His son enquired about Aricept, which could be discussed with Dr. Delice Lesch although given that the patient is not bothered particularly by his problems and that they are mild and not clearly due to AD, it may not be needed. Joyice Faster Njoku was encouraged to  contact me if any questions arise or if further follow up is desired.   Viviano Simas Nicole Kindred, PsyD, ABN Clinical Neuropsychologist  Service(s) Provided at This Encounter: 31 minutes [id number]; Conjoint therapy with patient present)

## 2020-05-18 NOTE — Patient Instructions (Signed)
Your performance and presentation on assessment were consistent with fairly good performance in most areas; nevertheless, you did have some mild difficulties on measures of memory given expectations for your performance and there were a few scattered low scores in other areas (I.e., processing speed, borderline verbal fluency). The pattern of low scores, however, is not particularly concerning for patterns that I tend to associate with the most common cause of age related decline (namely, Alzheimer's disease) and instead goes well with the type of problems people can have in the setting of vascular changes. 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   The major difference between mild cognitive impairment (MCI) and dementia is in severity and potential prognosis. Once someone reaches a level of severity adequate to be diagnosed with a dementia, there is usually progression over time, though this may be years. On the other hand, mild cognitive impairment, while a significant risk for dementia in future, does not always progress to dementia, and in some instances stays the same or can even revert to normal.It is important to realize that if MCI is due to underlying Alzheimer's disease, it will most likely progress to dementia eventually. The rate of conversion to Alzheimer's dementiafrom amnestic MCI is about 15% per year versus the general population risk of conversion of 2% per year.  Cerebrovascular disease is a major contributor to cognitive decline. Most people are familiar with stroke, which involves acute disruptions in blood flow to the brain that result in damage to underlying brain tissue. Small vessel ischemic disease is less well known but is more common and can be equally problematic. Small vessel ischemic disease involves wear and tear on small blood vessels in the brain over  time and increases with age, affecting nearly 100% of people in those older than 90 years. Small vessel ischemic disease increases risk for dementia and has been associated with Alzheimer's dementia in some studies. The best way to minimize cerebrovascular risk is to actively manage risk factors, including hypertension and high cholesterol. Medications can be helpful but maintaining a healthy body weight, getting regular exercise, and eating a heart-healthy diet such as the MIND diet or DASH diet are also crucially important.   There is now good quality evidence from at least one large scale study that a modified mediterranean diet may help slow cognitive decline. This is known as the "MIND" diet. The Mind diet is not so much a specific diet as it is a set of recommendations for things that you should and should not eat.   Foods that are ENCOURAGED on the MIND Diet:  Green, leafy vegetables: Aim for six or more servings per week. This includes kale, spinach, cooked greens and salads.  All other vegetables: Try to eat another vegetable in addition to the green leafy vegetables at least once a day. It is best to choose non-starchy vegetables because they have a lot of nutrients with a low number of calories.  Berries: Eat berries at least twice a week. There is a plethora of research on strawberries, and other berries such as blueberries, raspberries and blackberries have also been found to have antioxidant and brain health benefits.  Nuts: Try to get five servings of nuts or more each week. The creators of the MIND diet don't specify what kind of nuts to consume, but it is probably best to vary the type of nuts you eat to obtain  a variety of nutrients. Peanuts are a legume and do not fall into this category.  Olive oil: Use olive oil as your main cooking oil. There may be other heart-healthy alternatives such as algae oil, though there is not yet sufficient research upon which to base a formal  recommendation.  Whole grains: Aim for at least three servings daily. Choose minimally processed grains like oatmeal, quinoa, brown rice, whole-wheat pasta and 100% whole-wheat bread.  Fish: Eat fish at least once a week. It is best to choose fatty fish like salmon, sardines, trout, tuna and mackerel for their high amounts of omega-3 fatty acids.  Beans: Include beans in at least four meals every week. This includes all beans, lentils and soybeans.  Poultry: Try to eat chicken or [place] at least twice a week. Note that fried chicken is not encouraged on the MIND diet.  Wine: Aim for no more than one glass of alcohol daily. Both red and white wine may benefit the brain. However, much research has focused on the red wine compound resveratrol, which may help protect against Alzheimer's disease.  Foods that are DISCOURAGED on the MIND Diet: Butter and margarine: Try to eat less than 1 tablespoon (about 14 grams) daily. Instead, try using olive oil as your primary cooking fat, and dipping your bread in olive oil with herbs.  Cheese: The MIND diet recommends limiting your cheese consumption to less than once per week.  Red meat: Aim for no more than three servings each week. This includes all beef, pork, lamb and products made from these meats.  [place] food: The MIND diet highly discourages fried food, especially the kind from fast-food restaurants. Limit your consumption to less than once per week.  Pastries and sweets: This includes most of the processed junk food and desserts you can think of. Ice cream, cookies, brownies, snack cakes, donuts, candy and more. Try to limit these to no more than four times a week.  Exercise is one of the best medicines for promoting health and maintaining cognitive fitness at all stages in life. Exercise probably has the largest documented effect on brain health and performance of any lifestyle intervention. Studies have shown that even previously sedentary individuals who  start exercising as late as age 55 show a significant survival benefit as compared to their non-exercising peers. In the [place], the current guidelines are for 30 minutes of moderate exercise per day, but increasing your activity level less than that may also be helpful. You do not have to get your 30 minutes of exercise in one shot and exercising for short periods of time spread throughout the day can be helpful. Go for several walks, learn to dance, or do something else you enjoy that gets your body moving. Of course, if you have an underlying medical condition or there is any question about whether it is safe for you to exercise, you should consult a medical treatment provider prior to beginning exercise.   Reevaluation in 1 to 2 years could be considered if you notice any further changes or would like an updated assessment of your abilities. We now have a strong baseline upon which to compare your performance in the future.

## 2020-10-30 ENCOUNTER — Other Ambulatory Visit: Payer: Self-pay

## 2020-10-30 ENCOUNTER — Ambulatory Visit: Payer: Medicare Other | Admitting: Neurology

## 2020-10-30 ENCOUNTER — Encounter: Payer: Self-pay | Admitting: Neurology

## 2020-10-30 VITALS — BP 122/71 | HR 60 | Ht 73.0 in | Wt 219.6 lb

## 2020-10-30 DIAGNOSIS — F0150 Vascular dementia without behavioral disturbance: Secondary | ICD-10-CM | POA: Diagnosis not present

## 2020-10-30 DIAGNOSIS — F01A0 Vascular dementia, mild, without behavioral disturbance, psychotic disturbance, mood disturbance, and anxiety: Secondary | ICD-10-CM

## 2020-10-30 MED ORDER — DONEPEZIL HCL 10 MG PO TABS
10.0000 mg | ORAL_TABLET | Freq: Every day | ORAL | 3 refills | Status: DC
Start: 2020-10-30 — End: 2021-05-09

## 2020-10-30 NOTE — Progress Notes (Signed)
NEUROLOGY FOLLOW UP OFFICE NOTE  Joseph Lamb [id number] 31-Jul-1951  HISTORY OF PRESENT ILLNESS: I had the pleasure of seeing Joseph Lamb in follow-up in the neurology clinic on 10/30/2020.  The patient was last seen 6 months ago for memory loss. He is accompanied by his wife who helps supplement the history today.  Records and images were personally reviewed where available.  I personally reviewed MRI brain with and without contrast done 05/2020 which did not show any acute changes. There was moderate chronic microvascular disease, significantly progressed since MRI brain from 2002. 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 He has been taking Donepezil 5mg  daily without side effects.  Since his last visit, he feels his memory is the same. His wife notes worsening short-term memory, he asks the same questions later in the afternoon. He denies getting lost driving, although his wife does most of the driving. He occasionally forgets to take his evening medications. His wife manages finances. No hallucinations or personality changes. His wife reports talking in his sleep, sometimes he fights in his sleep, jumping up in the middle of the bed and telling them he would shoot them. She has noticed he seems to have poor balance in the morning, says he is dizzy when he first gets up, and walks slowly. This improves throughout the day. No falls. He has occasional hand tremors affecting using utensils. No family history of tremors. His right arm is still sore from the COVID booster he got last month.   History on Initial Assessment 04/05/2020: This is a 70 year old right-handed man with a history of hypertension, diabetes, hyperlipidemia, presenting for evaluation of memory changes. He was evaluated in our office  in September 2016 for similar concerns, MOCA score at that time was 29/30. He was also evaluated by neurologist Dr. October 2016 in 2013 for memory loss that started around 2010 or 2011. MOCA score 28/30 in 2013.  MRI brain in 2012 showed mild diffuse volume loss, mild to moderate chronic microvascular disease, a small area of cortical encephalomalacia in the right perirolandic cortex. He has been taking Donepezil 5mg  daily since 2013, however on his visit with Dr. last March, he reported he was not taking Donepezil and Sertraline regularly because he did not think he needed it. It was unclear if this was due to stubbornness or cognitive decline, his wife continued to report worsening memory and behavior. He states he has been taking medications regularly since March, he feels his memory is "fair, not perfect." He remembers "when I want to remember." His wife tells him he forgets to do things. He denies getting lost driving. He manages his own medications and occasionally misses doses when "I get lazy and go to sleep." He misplaces things frequently. If he is not careful, he has burned food. His wife manages finances. He feels more laidback since taking the sertraline regularly. He denies any hallucinations. Sleep is good.   He is concerned about occasional bilateral hand tremors that started 4-5 months ago. He has noticed a little weakness in his left hand. Tremors are noticeable when he is using a pen or fork. No family history of tremors. He denies any headaches, diplopia, dysarthria/dysphagia, neck/back pain, focal numbness/tingling, bowel/bladder dysfunction, anosmia. No family history of dementia, no history of significant head injuries or alcohol use.    PAST  MEDICAL HISTORY: Past Medical History:  Diagnosis Date  . Arrhythmia   . Diabetes mellitus type 2, uncomplicated (HCC) 06/14/2015  . Essential hypertension 06/14/2015  . High cholesterol   . Hyperlipidemia 06/14/2015  . Hypertension   . Memory  loss 06/14/2015  . Pre-diabetes     MEDICATIONS: Current Outpatient Medications on File Prior to Visit  Medication Sig Dispense Refill  . amLODipine (NORVASC) 5 MG tablet Take 1 tablet (5 mg total) by mouth daily. 180 tablet 3  . aspirin EC 81 MG tablet Take 1 tablet (81 mg total) by mouth daily. 90 tablet 3  . donepezil (ARICEPT) 5 MG tablet Take 5 mg by mouth at bedtime.    . hydrochlorothiazide (HYDRODIURIL) 25 MG tablet Take 25 mg by mouth every morning.    . latanoprost (XALATAN) 0.005 % ophthalmic solution 1 drop at bedtime.    . pravastatin (PRAVACHOL) 10 MG tablet Take 10 mg by mouth daily.    . sertraline (ZOLOFT) 50 MG tablet Take 50 mg by mouth daily.    . tadalafil (CIALIS) 20 MG tablet Take 20 mg by mouth daily as needed.    . TRULICITY 1.5 MG/0.5ML SOPN Inject 1.5 mg into the skin once a week.  0   No current facility-administered medications on file prior to visit.    ALLERGIES: Allergies  Allergen Reactions  . Lisinopril Cough  . Crestor [Rosuvastatin Calcium]     " fatigue"   . Simvastatin Other (See Comments)    "fatigue"    FAMILY HISTORY: Family History  Problem Relation Age of Onset  . Diabetes Mother   . Diabetes Father     SOCIAL HISTORY: Social History   Socioeconomic History  . Marital status: Married    Spouse name: Not on file  . Number of children: 2  . Years of education: 30  . Highest education level: Not on file  Occupational History  . Occupation: Retired  Tobacco Use  . Smoking status: Never Smoker  . Smokeless tobacco: Never Used  Vaping Use  . Vaping Use: Never used  Substance and Sexual Activity  . Alcohol use: Yes    Alcohol/week: 0.0 standard drinks    Comment: rare  . Drug use: No  . Sexual activity: Not on file  Other Topics Concern  . Not on file  Social History Narrative   Right handed   One story home   Drinks no caffeine, occ soda.   Social Determinants of Health   Financial Resource Strain: Not on file   Food Insecurity: Not on file  Transportation Needs: Not on file  Physical Activity: Not on file  Stress: Not on file  Social Connections: Not on file  Intimate Partner Violence: Not on file     PHYSICAL EXAM: Vitals:   10/30/20 1127  BP: 122/71  Pulse: 60  SpO2: 97%   General: No acute distress, flat affect Head:  Normocephalic/atraumatic Skin/Extremities: No rash, no edema Neurological Exam: alert and awake. No aphasia or dysarthria. Fund of knowledge is appropriate.  Attention and concentration are normal.   Cranial nerves: Pupils equal, round. Extraocular movements intact with no nystagmus. Visual fields full.  No facial asymmetry.  Motor: Bulk and tone normal, no cogwheeling, muscle strength 5/5 throughout with no pronator drift.   Finger to nose testing intact.  Gait narrow-based and steady, no ataxia. No tremor in office today. Good finger taps and rapid alternating movements.    IMPRESSION: This is a pleasant 70 yo RH  man with a history of hypertension, hyperlipidemia, diabetes, with Mild vascular neurocognitive disorder (Neuropsychological evaluation in August 2021). MRI brain showed moderate chronic microvascular disease. Findings discussed with patient and wife today, we went over his brain MRI. Discussed the importance of control of vascular risk factors, physical exercise, and brain stimulation exercises for brain health. His wife reports worsening memory, discussed increasing Donepezil to 10mg  daily, as well as expectations from Donepezil. His wife was instructed to start checking behind him to ensure medication compliance. Continue to monitor driving. Discuss arm pain with PCP. Follow-up in 6-8 months, they know to call for any changes.   Thank you for allowing me to participate in his care.  Please do not hesitate to call for any questions or concerns.   , M.D.   CC: Dr. Patrcia Dolly

## 2020-10-30 NOTE — Patient Instructions (Signed)
1. Increase Donepezil to 10mg : take 1 tablet every night  2. Please have your wife check behind and help with the medications  3. Follow-up in 6-8 months, call for any changes.   FALL PRECAUTIONS: Be cautious when walking. Scan the area for obstacles that may increase the risk of trips and falls. When getting up in the mornings, sit up at the edge of the bed for a few minutes before getting out of bed. Consider elevating the bed at the head end to avoid drop of blood pressure when getting up. Walk always in a well-lit room (use night lights in the walls). Avoid area rugs or power cords from appliances in the middle of the walkways. Use a walker or a cane if necessary and consider physical therapy for balance exercise. Get your eyesight checked regularly.  FINANCIAL OVERSIGHT: Supervision, especially oversight when making financial decisions or transactions is also recommended.  HOME SAFETY: Consider the safety of the kitchen when operating appliances like stoves, microwave oven, and blender. Consider having supervision and share cooking responsibilities until no longer able to participate in those. Accidents with firearms and other hazards in the house should be identified and addressed as well.  DRIVING: Regarding driving, in patients with progressive memory problems, driving will be impaired. We advise to have someone else do the driving if trouble finding directions or if minor accidents are reported. Independent driving assessment is available to determine safety of driving.  ABILITY TO BE LEFT ALONE: If patient is unable to contact 911 operator, consider using LifeLine, or when the need is there, arrange for someone to stay with patients. Smoking is a fire hazard, consider supervision or cessation. Risk of wandering should be assessed by caregiver and if detected at any point, supervision and safe proof recommendations should be instituted.  MEDICATION SUPERVISION: Inability to self-administer  medication needs to be constantly addressed. Implement a mechanism to ensure safe administration of the medications.  RECOMMENDATIONS FOR ALL PATIENTS WITH MEMORY PROBLEMS: 1. Continue to exercise (Recommend 30 minutes of walking everyday, or 3 hours every week) 2. Increase social interactions - continue going to [place] and enjoy social gatherings with friends and family 3. Eat healthy, avoid fried foods and eat more fruits and vegetables 4. Maintain adequate blood pressure, blood sugar, and blood cholesterol level. Reducing the risk of stroke and cardiovascular disease also helps promoting better memory. 5. Avoid stressful situations. Live a simple life and avoid aggravations. Organize your time and prepare for the next day in anticipation. 6. Sleep well, avoid any interruptions of sleep and avoid any distractions in the bedroom that may interfere with adequate sleep quality 7. Avoid sugar, avoid sweets as there is a strong link between excessive sugar intake, diabetes, and cognitive impairment We discussed the Mediterranean diet, which has been shown to help patients reduce the risk of progressive memory disorders and reduces cardiovascular risk. This includes eating fish, eat fruits and green leafy vegetables, nuts like almonds and hazelnuts, walnuts, and also use olive oil. Avoid fast foods and fried foods as much as possible. Avoid sweets and sugar as sugar use has been linked to worsening of memory function.  There is always a concern of gradual progression of memory problems. If this is the case, then we may need to adjust level of care according to patient needs. Support, both to the patient and caregiver, should then be put into place.

## 2021-03-06 DIAGNOSIS — E7800 Pure hypercholesterolemia, unspecified: Secondary | ICD-10-CM | POA: Insufficient documentation

## 2021-03-06 DIAGNOSIS — R7303 Prediabetes: Secondary | ICD-10-CM | POA: Insufficient documentation

## 2021-03-06 DIAGNOSIS — I499 Cardiac arrhythmia, unspecified: Secondary | ICD-10-CM | POA: Insufficient documentation

## 2021-03-06 DIAGNOSIS — I10 Essential (primary) hypertension: Secondary | ICD-10-CM | POA: Insufficient documentation

## 2021-03-07 ENCOUNTER — Other Ambulatory Visit: Payer: Self-pay

## 2021-03-07 ENCOUNTER — Encounter: Payer: Self-pay | Admitting: Cardiology

## 2021-03-07 ENCOUNTER — Ambulatory Visit (INDEPENDENT_AMBULATORY_CARE_PROVIDER_SITE_OTHER): Payer: Medicare Other

## 2021-03-07 ENCOUNTER — Ambulatory Visit: Payer: Medicare Other | Admitting: Cardiology

## 2021-03-07 VITALS — BP 150/84 | HR 56 | Ht 73.0 in | Wt 225.0 lb

## 2021-03-07 DIAGNOSIS — I10 Essential (primary) hypertension: Secondary | ICD-10-CM

## 2021-03-07 DIAGNOSIS — E119 Type 2 diabetes mellitus without complications: Secondary | ICD-10-CM

## 2021-03-07 DIAGNOSIS — R42 Dizziness and giddiness: Secondary | ICD-10-CM

## 2021-03-07 DIAGNOSIS — E782 Mixed hyperlipidemia: Secondary | ICD-10-CM

## 2021-03-07 MED ORDER — AMLODIPINE BESYLATE 5 MG PO TABS: 5.0000 mg | ORAL_TABLET | Freq: Every day | ORAL | 3 refills | Status: DC

## 2021-03-07 MED ORDER — PRAVASTATIN SODIUM 10 MG PO TABS
10.0000 mg | ORAL_TABLET | Freq: Every day | ORAL | 3 refills | Status: DC
Start: 2021-03-07 — End: 2021-08-31

## 2021-03-07 NOTE — Progress Notes (Signed)
Cardiology Office Note:    Date:  03/07/2021   ID:  Joseph Lamb, DOB 1951/05/12, MRN [id number]  PCP:  Lupita Raider, MD  Cardiologist:  Garwin Brothers, MD   Referring MD: Lupita Raider, MD    ASSESSMENT:    1. Essential hypertension   2. Mixed hyperlipidemia   3. Type 2 diabetes mellitus without complication, without long-term current use of insulin (HCC)    PLAN:    In order of problems listed above:  1. Primary prevention stressed with the patient.  Importance of compliance with diet medication stressed any vocalized understanding. 2. Bradycardia: Patient complains of fatigue like symptoms.  I will do a 2-week monitor to assess this.  He mentions to me that his BLOOD work including TSH has been done by primary care.  EKG done today reveals sinus bradycardia nonspecific ST-T changes. 3. Essential hypertension: Blood pressure is elevated but he has not taken medications for quite some time.  It has been 2 weeks since he is on multiple medications.  In view of his fatigue and borderline blood pressure I will initiate only amlodipine and not hydrochlorothiazide. 4. Diabetes mellitus Dyslipidemia: Followed by primary care. 5. Patient will be seen in follow-up appointment in 6 months or earlier if the patient has any concerns.  It should be noted that he has not had any significantly dizzy spells or syncopal spells.   Medication Adjustments/Labs and Tests Ordered: Current medicines are reviewed at length with the patient today.  Concerns regarding medicines are outlined above.  No orders of the defined types were placed in this encounter.  No orders of the defined types were placed in this encounter.    Chief Complaint  Patient presents with  . Annual Exam     History of Present Illness:    Joseph Lamb is a 70 y.o. male.  Patient has past medical history of essential hypertension, dyslipidemia, diabetes mellitus.  Patient mentions to me that he feels fatigued.   He has not taken his hypertension medications for the past couple of weeks as he ran out of them.  No chest pain orthopnea or PND.  At the time of my evaluation, the patient is alert awake oriented and in no distress.  He has had issues with bradycardia in the past.  Past Medical History:  Diagnosis Date  . Arrhythmia   . Diabetes mellitus type 2, uncomplicated (HCC) 06/14/2015  . Dizzy spells 03/08/2020  . Essential hypertension 06/14/2015  . High cholesterol   . Hyperlipidemia 06/14/2015  . Hypertension   . Memory loss 06/14/2015  . Pre-diabetes   . Sinus bradycardia 03/08/2020    Past Surgical History:  Procedure Laterality Date  . PROSTATE SURGERY      Current Medications: Current Meds  Medication Sig  . amLODipine (NORVASC) 10 MG tablet Take 10 mg by mouth daily.  Marland Kitchen aspirin EC 81 MG tablet Take 1 tablet (81 mg total) by mouth daily.  Marland Kitchen donepezil (ARICEPT) 10 MG tablet Take 1 tablet (10 mg total) by mouth at bedtime.  . hydrochlorothiazide (HYDRODIURIL) 25 MG tablet Take 25 mg by mouth every morning.  . latanoprost (XALATAN) 0.005 % ophthalmic solution Place 1 drop into both eyes at bedtime.  . pravastatin (PRAVACHOL) 10 MG tablet Take 10 mg by mouth daily.  . sertraline (ZOLOFT) 50 MG tablet Take 50 mg by mouth daily.  . tadalafil (CIALIS) 20 MG tablet Take 20 mg by mouth daily as needed for erectile dysfunction.  Marland Kitchen  TRULICITY 1.5 MG/0.5ML SOPN Inject 1.5 mg into the skin once a week.     Allergies:   Lisinopril, Crestor [rosuvastatin calcium], and Simvastatin   Social History   Socioeconomic History  . Marital status: Married    Spouse name: Not on file  . Number of children: 2  . Years of education: 35  . Highest education level: Not on file  Occupational History  . Occupation: Retired  Tobacco Use  . Smoking status: Never Smoker  . Smokeless tobacco: Never Used  Vaping Use  . Vaping Use: Never used  Substance and Sexual Activity  . Alcohol use: Not Currently     Alcohol/week: 0.0 standard drinks    Comment: rare  . Drug use: No  . Sexual activity: Not on file  Other Topics Concern  . Not on file  Social History Narrative   Right handed   One story home   Drinks no caffeine, occ soda.   Lives with wife and son   Social Determinants of Health   Financial Resource Strain: Not on file  Food Insecurity: Not on file  Transportation Needs: Not on file  Physical Activity: Not on file  Stress: Not on file  Social Connections: Not on file     Family History: The patient's family history includes Diabetes in his father and mother.  ROS:   Please see the history of present illness.    All other systems reviewed and are negative.  EKGs/Labs/Other Studies Reviewed:    The following studies were reviewed today:   IMPRESSIONS  1. Left ventricular ejection fraction, by estimation, is 60 to 65%. The  left ventricle has normal function. The left ventricle has no regional  wall motion abnormalities. There is mild left ventricular hypertrophy.  Left ventricular diastolic parameters  were normal.  2. Left atrial size was mildly dilated.  3. The mitral valve is normal in structure. Mild mitral valve  regurgitation. No evidence of mitral stenosis.    Recent Labs: No results found for requested labs within last 8760 hours.  Recent Lipid Panel No results found for: CHOL, TRIG, HDL, CHOLHDL, VLDL, LDLCALC, LDLDIRECT  Physical Exam:    VS:  BP (!) 150/84 (BP Location: Right Arm, Patient Position: Sitting, Cuff Size: Normal)   Pulse (!) 56   Ht 6\' 1"  (1.854 m)   Wt 225 lb (102.1 kg)   SpO2 95%   BMI 29.69 kg/m     Wt Readings from Last 3 Encounters:  03/07/21 225 lb (102.1 kg)  10/30/20 219 lb 9.6 oz (99.6 kg)  04/07/20 217 lb (98.4 kg)     GEN: Patient is in no acute distress HEENT: Normal NECK: No JVD; No carotid bruits LYMPHATICS: No lymphadenopathy CARDIAC: Hear sounds regular, 2/6 systolic murmur at the apex. RESPIRATORY:   Clear to auscultation without rales, wheezing or rhonchi  ABDOMEN: Soft, non-tender, non-distended MUSCULOSKELETAL:  No edema; No deformity  SKIN: Warm and dry NEUROLOGIC:  Alert and oriented x 3 PSYCHIATRIC:  Normal affect   Signed, 06/08/20, MD  03/07/2021 2:41 PM    [facility]

## 2021-03-07 NOTE — Patient Instructions (Signed)
Medication Instructions:  Your physician has recommended you make the following change in your medication:   Decrease your Amlodipine from 10 mg to 5 mg daily. Stop your hydrochlorothiazide.   *If you need a refill on your cardiac medications before your next appointment, please call your pharmacy*   Lab Work: None ordered If you have labs (blood work) drawn today and your tests are completely normal, you will receive your results only by: Marland Kitchen MyChart Message (if you have MyChart) OR . A paper copy in the mail If you have any lab test that is abnormal or we need to change your treatment, we will call you to review the results.   Testing/Procedures:  WHY IS MY DOCTOR PRESCRIBING ZIO? The Zio system is proven and trusted by physicians to detect and diagnose irregular heart rhythms -- and has been prescribed to hundreds of thousands of patients.  The FDA has cleared the Zio system to monitor for many different kinds of irregular heart rhythms. In a study, physicians were able to reach a diagnosis 90% of the time with the Zio system1.  You can wear the Zio monitor -- a small, discreet, comfortable patch -- during your normal day-to-day activity, including while you sleep, shower, and exercise, while it records every single heartbeat for analysis.  1Barrett, P., et al. Comparison of 24 Hour Holter Monitoring Versus 14 Day Novel Adhesive Patch Electrocardiographic Monitoring. American Journal of Medicine, 2014.  ZIO VS. HOLTER MONITORING The Zio monitor can be comfortably worn for up to 14 days. Holter monitors can be worn for 24 to 48 hours, limiting the time to record any irregular heart rhythms you may have. Zio is able to capture data for the 51% of patients who have their first symptom-triggered arrhythmia after 48 hours.1  LIVE WITHOUT RESTRICTIONS The Zio ambulatory cardiac monitor is a small, unobtrusive, and water-resistant patch--you might even forget you're wearing it. The Zio  monitor records and stores every beat of your heart, whether you're sleeping, working out, or showering. Wear the monitor for 14 days, remove 03/21/21.   Follow-Up: At [facility], you and your health needs are our priority.  As part of our continuing mission to provide you with exceptional heart care, we have created designated Provider Care Teams.  These Care Teams include your primary Cardiologist (physician) and Advanced Practice Providers (APPs -  Physician Assistants and Nurse Practitioners) who all work together to provide you with the care you need, when you need it.  We recommend signing up for the patient portal called "MyChart".  Sign up information is provided on this After Visit Summary.  MyChart is used to connect with patients for Virtual Visits (Telemedicine).  Patients are able to view lab/test results, encounter notes, upcoming appointments, etc.  Non-urgent messages can be sent to your provider as well.   To learn more about what you can do with MyChart, go to [email or web address].    Your next appointment:   2 month(s)  The format for your next appointment:   In Person  Provider:   Belva Crome, MD   Other Instructions NA

## 2021-03-21 DIAGNOSIS — R42 Dizziness and giddiness: Secondary | ICD-10-CM | POA: Diagnosis not present

## 2021-04-21 ENCOUNTER — Other Ambulatory Visit: Payer: Self-pay

## 2021-04-21 ENCOUNTER — Encounter (HOSPITAL_COMMUNITY): Payer: Self-pay | Admitting: Emergency Medicine

## 2021-04-21 ENCOUNTER — Emergency Department (HOSPITAL_COMMUNITY): Payer: Medicare Other

## 2021-04-21 ENCOUNTER — Emergency Department (HOSPITAL_COMMUNITY)
Admission: EM | Admit: 2021-04-21 | Discharge: 2021-04-21 | Disposition: A | Discharge status: Home / Self Care | Payer: Medicare Other | Attending: Emergency Medicine | Admitting: Emergency Medicine

## 2021-04-21 DIAGNOSIS — Z79899 Other long term (current) drug therapy: Secondary | ICD-10-CM | POA: Diagnosis not present

## 2021-04-21 DIAGNOSIS — Z7982 Long term (current) use of aspirin: Secondary | ICD-10-CM | POA: Insufficient documentation

## 2021-04-21 DIAGNOSIS — R519 Headache, unspecified: Secondary | ICD-10-CM | POA: Diagnosis present

## 2021-04-21 DIAGNOSIS — E119 Type 2 diabetes mellitus without complications: Secondary | ICD-10-CM | POA: Diagnosis not present

## 2021-04-21 DIAGNOSIS — I10 Essential (primary) hypertension: Secondary | ICD-10-CM | POA: Diagnosis not present

## 2021-04-21 LAB — BASIC METABOLIC PANEL
Anion gap: 8 (ref 5–15)
BUN: 12 mg/dL (ref 8–23)
CO2: 24 mmol/L (ref 22–32)
Calcium: 9.5 mg/dL (ref 8.9–10.3)
Chloride: 105 mmol/L (ref 98–111)
Creatinine, Ser: 1.2 mg/dL (ref 0.61–1.24)
GFR, Estimated: >60 mL/min (ref >60)
Glucose, Bld: 113 mg/dL — ABNORMAL HIGH (ref 70–99)
Potassium: 3.7 mmol/L (ref 3.5–5.1)
Sodium: 137 mmol/L (ref 135–145)

## 2021-04-21 LAB — CBC WITH DIFFERENTIAL/PLATELET
Abs Immature Granulocytes: 0.02 10*3/uL (ref 0.00–0.07)
Basophils Absolute: 0.1 10*3/uL (ref 0.0–0.1)
Basophils Relative: 1 %
Eosinophils Absolute: 0.1 10*3/uL (ref 0.0–0.5)
Eosinophils Relative: 1 %
HCT: 48.8 % (ref 39.0–52.0)
Hemoglobin: 16.1 g/dL (ref 13.0–17.0)
Immature Granulocytes: 0 %
Lymphocytes Relative: 22 %
Lymphs Abs: 1.6 10*3/uL (ref 0.7–4.0)
MCH: 28 pg (ref 26.0–34.0)
MCHC: 33 g/dL (ref 30.0–36.0)
MCV: 85 fL (ref 80.0–100.0)
Monocytes Absolute: 0.7 10*3/uL (ref 0.1–1.0)
Monocytes Relative: 9 %
Neutro Abs: 4.9 10*3/uL (ref 1.7–7.7)
Neutrophils Relative %: 67 %
Platelets: 251 10*3/uL (ref 150–400)
RBC: 5.74 MIL/uL (ref 4.22–5.81)
RDW: 14.8 % (ref 11.5–15.5)
WBC: 7.3 10*3/uL (ref 4.0–10.5)
nRBC: 0 % (ref 0.0–0.2)

## 2021-04-21 MED ORDER — CLONIDINE HCL 0.1 MG PO TABS
0.1000 mg | ORAL_TABLET | Freq: Once | ORAL | Status: AC
  Administered 2021-04-21: 0.1 mg via ORAL
  Filled 2021-04-21: qty 1

## 2021-04-21 MED ORDER — MORPHINE SULFATE (PF) 4 MG/ML IV SOLN
4.0000 mg | Freq: Once | INTRAVENOUS | Status: AC
  Administered 2021-04-21: 4 mg via INTRAVENOUS
  Filled 2021-04-21: qty 1

## 2021-04-21 MED ORDER — FLUORESCEIN SODIUM 1 MG OP STRP
1.0000 | ORAL_STRIP | Freq: Once | OPHTHALMIC | Status: AC
  Administered 2021-04-21: 1 via OPHTHALMIC
  Filled 2021-04-21: qty 1

## 2021-04-21 MED ORDER — TETRACAINE HCL 0.5 % OP SOLN
2.0000 [drp] | Freq: Once | OPHTHALMIC | Status: AC
  Administered 2021-04-21: 2 [drp] via OPHTHALMIC
  Filled 2021-04-21: qty 4

## 2021-04-21 MED ORDER — AMLODIPINE BESYLATE 5 MG PO TABS
5.0000 mg | ORAL_TABLET | Freq: Every day | ORAL | Status: DC
Start: 2021-04-21 — End: 2021-04-21
  Administered 2021-04-21: 5 mg via ORAL
  Filled 2021-04-21: qty 1

## 2021-04-21 MED ORDER — HYDRALAZINE HCL 20 MG/ML IJ SOLN
5.0000 mg | Freq: Once | INTRAMUSCULAR | Status: AC
  Administered 2021-04-21: 5 mg via INTRAVENOUS
  Filled 2021-04-21: qty 1

## 2021-04-21 MED ORDER — ONDANSETRON HCL 4 MG/2ML IJ SOLN
4.0000 mg | Freq: Once | INTRAMUSCULAR | Status: AC
  Administered 2021-04-21: 4 mg via INTRAVENOUS
  Filled 2021-04-21: qty 2

## 2021-04-21 NOTE — ED Notes (Signed)
Care assumed. Pt not in room at this time. Pt in CT

## 2021-04-21 NOTE — ED Triage Notes (Signed)
Patient reports elevated blood pressure at home this morning BP= 240/113 , he has not taken his antihypertensive medications for 3 days , patient added mild headache , denies SOB /no emesis .

## 2021-04-21 NOTE — Discharge Instructions (Addendum)
Follow-up with Dr. Dione Booze on Monday at 8 AM  Return for new or worsening symptoms

## 2021-04-21 NOTE — ED Provider Notes (Signed)
[facility] EMERGENCY DEPARTMENT Provider Note   CSN: [id number] Arrival date & time: 04/21/21  1027     History Chief Complaint  Patient presents with   Hypertension    Joseph Lamb is a 70 y.o. male with past medical history significant for diabetes, arrhythmia, hypertension, hyperlipidemia, memory loss who presents for evaluation of headache.  Patient states awoke in the middle of the night to a Headache to His Central Forehead.  He Took His Blood Pressure Which Was 240 Systolic.  He Has Not Taken His Blood Pressure Medicines over the Last 3 Days.  States He Did Have Eye Surgery to His Left Eye 2 Days Ago with Dr. Dione Booze.  States He Has Had Pain in That Eye since His Surgery  however this has not worsening. Does have some pain over the left eye at forehead.  Denies Any Visual Field Changes.  No Syncope, Lightheadedness, Dizziness, Chest Pain, Shortness of Breath Abdominal Pain, Diarrhea, Dysuria.  Denies Additional Aggravating or Alleviating Factors.  Rates his current pain a 6/10.  Denies any sudden onset thunderclap headache, traumatic injuries.  Patient unsure why he had surgery to left eye.  Poor historian, hx of memory deficits followed by Neuro.  History obtained from patient and past medical records.  No interpreter used.  History per wife in room, Glaucoma surgery a few days ago with Dr. Dione Booze. Patient has been complaints about some pain to left eye however HA developed this morning. Patient has not been complaints with BP meds at home. Wife states putting drops in eyes however unsure name of the drop.  HPI     Past Medical History:  Diagnosis Date   Arrhythmia    Diabetes mellitus type 2, uncomplicated (HCC) 06/14/2015   Dizzy spells 03/08/2020   Essential hypertension 06/14/2015   High cholesterol    Hyperlipidemia 06/14/2015   Hypertension    Memory loss 06/14/2015   Pre-diabetes    Sinus bradycardia 03/08/2020    Patient Active Problem List    Diagnosis Date Noted   Arrhythmia    High cholesterol    Hypertension    Pre-diabetes    Dizzy spells 03/08/2020   Sinus bradycardia 03/08/2020   Memory loss 06/14/2015   Essential hypertension 06/14/2015   Hyperlipidemia 06/14/2015   Diabetes mellitus type 2, uncomplicated (HCC) 06/14/2015    Past Surgical History:  Procedure Laterality Date   PROSTATE SURGERY         Family History  Problem Relation Age of Onset   Diabetes Mother    Diabetes Father     Social History   Tobacco Use   Smoking status: Never   Smokeless tobacco: Never  Vaping Use   Vaping Use: Never used  Substance Use Topics   Alcohol use: Not Currently    Alcohol/week: 0.0 standard drinks    Comment: rare   Drug use: No    Home Medications Prior to Admission medications   Medication Sig Start Date End Date Taking? Authorizing Provider  amLODipine (NORVASC) 5 MG tablet Take 1 tablet (5 mg total) by mouth daily. 03/07/21 06/05/21  Revankar, Aundra Dubin, MD  aspirin EC 81 MG tablet Take 1 tablet (81 mg total) by mouth daily. 07/24/17   Revankar, Aundra Dubin, MD  donepezil (ARICEPT) 10 MG tablet Take 1 tablet (10 mg total) by mouth at bedtime. 10/30/20   Van Clines, MD  latanoprost (XALATAN) 0.005 % ophthalmic solution Place 1 drop into both eyes at bedtime. 02/18/20  [provider]  pravastatin (PRAVACHOL) 10 MG tablet Take 1 tablet (10 mg total) by mouth daily. 03/07/21   Revankar, Aundra Dubinajan R, MD  sertraline (ZOLOFT) 50 MG tablet Take 50 mg by mouth daily. 01/08/20   [provider]  tadalafil (CIALIS) 20 MG tablet Take 20 mg by mouth daily as needed for erectile dysfunction. 02/01/20   [provider]  TRULICITY 1.5 MG/0.5ML SOPN Inject 1.5 mg into the skin once a week. 06/02/17   [provider]    Allergies    Lisinopril, Crestor [rosuvastatin calcium], and Simvastatin  Review of Systems   Review of Systems  Constitutional: Negative.   HENT: Negative.    Eyes:   Positive for pain (x 2 days since surgery).  Respiratory: Negative.    Cardiovascular: Negative.   Gastrointestinal: Negative.   Genitourinary: Negative.   Musculoskeletal: Negative.   Skin: Negative.   Neurological:  Positive for headaches. Negative for dizziness, tremors, seizures, syncope, facial asymmetry, speech difficulty, weakness, light-headedness and numbness.  All other systems reviewed and are negative.  Physical Exam Updated Vital Signs BP 131/69   Pulse 63   Temp 98 F (36.7 C)   Resp 10   Ht 6\' 1"  (1.854 m)   Wt 115 kg   SpO2 99%   BMI 33.45 kg/m   Physical Exam HENT:     Head:     Comments: No injection. PERRLA Eyes:     General: Lids are everted, no foreign bodies appreciated.     Extraocular Movements: Extraocular movements intact.     Conjunctiva/sclera: Conjunctivae normal.     Visual Fields: Right eye visual fields normal and left eye visual fields normal.     Comments: Overall difficult exam due to patient compliance. IOP right 21, 20,17 IOP left 24, 41, 28  Negative fluorescein stain bilaterally   Physical Exam  Constitutional: Pt is oriented to person, place, and time. Pt appears well-developed and well-nourished. No distress.  HENT:  Head: Normocephalic and atraumatic.  Mouth/Throat: Oropharynx is clear and moist.  Eyes: Conjunctivae and EOM are normal. Pupils are equal, round, and reactive to light. No scleral icterus.  No horizontal, vertical or rotational nystagmus  Neck: Normal range of motion. Neck supple.  Full active and passive ROM without pain No midline or paraspinal tenderness No nuchal rigidity or meningeal signs  Cardiovascular: Normal rate, regular rhythm and intact distal pulses.   Pulmonary/Chest: Effort normal and breath sounds normal. No respiratory distress. Pt has no wheezes. No rales.  Abdominal: Soft. Bowel sounds are normal. There is no tenderness. There is no rebound and no guarding.  Musculoskeletal: Normal range of  motion.  Lymphadenopathy:    No cervical adenopathy.  Neurological: Pt. is alert and oriented to person, place, and time. He has normal reflexes. No cranial nerve deficit.  Exhibits normal muscle tone. Coordination normal.  Mental Status:  Alert, oriented, thought content appropriate. Speech fluent without evidence of aphasia. Able to follow 2 step commands without difficulty.  Cranial Nerves:  II:  Peripheral visual fields grossly normal, pupils equal, round, reactive to light III,IV, VI: ptosis not present, extra-ocular motions intact bilaterally  V,VII: smile symmetric, facial light touch sensation equal VIII: hearing grossly normal bilaterally  IX,X: midline uvula rise  XI: bilateral shoulder shrug equal and strong XII: midline tongue extension  Motor:  5/5 in upper and lower extremities bilaterally including strong and equal grip strength and dorsiflexion/plantar flexion Sensory: Pinprick and light touch normal in all extremities.  Deep Tendon Reflexes: 2+ and symmetric  Cerebellar: normal finger-to-nose with bilateral upper extremities Gait: normal gait and balance CV: distal pulses palpable throughout   Skin: Skin is warm and dry. No rash noted. Pt is not diaphoretic.  Psychiatric: Pt has a normal mood and affect. Behavior is normal. Judgment and thought content normal.  Nursing note and vitals reviewed.  ED Results / Procedures / Treatments   Labs (all labs ordered are listed, but only abnormal results are displayed) Labs Reviewed  BASIC METABOLIC PANEL - Abnormal; Notable for the following components:      Result Value   Glucose, Bld 113 (*)    All other components within normal limits  CBC WITH DIFFERENTIAL/PLATELET    EKG EKG Interpretation  Date/Time:  Saturday April 21 2021 09:31:37 EDT Ventricular Rate:  55 PR Interval:  209 QRS Duration: 105 QT Interval:  423 QTC Calculation: 405 R Axis:   77 Text Interpretation: Sinus arrhythmia Borderline T abnormalities,  inferior leads No significant change since prior 1/11 Confirmed by Meridee Score [id number]) on 04/21/2021 9:37:29 AM  Radiology CT Head Wo Contrast  Result Date: 04/21/2021 CLINICAL DATA:  Headache, new or worsening.  Hypertension. EXAM: CT HEAD WITHOUT CONTRAST TECHNIQUE: Contiguous axial images were obtained from the base of the skull through the vertex without intravenous contrast. COMPARISON:  None. FINDINGS: Brain: Periventricular white matter changes are consistent with small vessel disease. There is no intra or extra-axial fluid collection or mass lesion. The basilar cisterns and ventricles have a normal appearance. There is no CT evidence for acute infarction or hemorrhage. Vascular: No hyperdense vessel or unexpected calcification. Skull: Normal. Negative for fracture or focal lesion. Sinuses/Orbits: There is mucosal thickening and partial opacification of the ethmoid and RIGHT maxillary sinus. No air-fluid levels. Mastoid air cells are normal. Orbits unremarkable. Other: None IMPRESSION: No evidence for acute intracranial abnormality. Small vessel disease. Chronic sinus changes. Electronically Signed   By: Norva Pavlov M.D.   On: 04/21/2021 09:40    Procedures Procedures   Medications Ordered in ED Medications  amLODipine (NORVASC) tablet 5 mg (5 mg Oral Given 04/21/21 1002)  morphine 4 MG/ML injection 4 mg (4 mg Intravenous Given 04/21/21 0941)  ondansetron (ZOFRAN) injection 4 mg (4 mg Intravenous Given 04/21/21 0941)  cloNIDine (CATAPRES) tablet 0.1 mg (0.1 mg Oral Given 04/21/21 1040)  fluorescein ophthalmic strip 1 strip (1 strip Left Eye Given 04/21/21 1110)  tetracaine (PONTOCAINE) 0.5 % ophthalmic solution 2 drop (2 drops Left Eye Given 04/21/21 1110)  hydrALAZINE (APRESOLINE) injection 5 mg (5 mg Intravenous Given 04/21/21 1257)    ED Course  I have reviewed the triage vital signs and the nursing notes.  Pertinent labs & imaging results that were available during my care of the  patient were reviewed by me and considered in my medical decision making (see chart for details).  70 year old here for evaluation of hypertension and headache.  He is afebrile, nonseptic, not ill-appearing.  Patient is a poor historian.  Apparently developed headache yesterday evening when he woke to go the bathroom.  Denies any sudden onset headache, traumatic injuries.  Did recently have eye surgery 2 days ago however patient is unsure why he had eye surgery.  He admits to some pain to the left and central forehead.  States he has had chronic eye pain since his surgery.  Denies any visual field deficits.  No neck stiffness or neck rigidity.  He has a nonfocal neuro exam without deficits.  Plan on  imaging, labs.  CBC without leukocytosis Metabolic panel without electrolyte, renal abnormality CT head without significant abnormality  CONSULT with Dr. Dione Booze with Opthalmology given persistent eye pan since surgery.    Clinical Course as of 04/21/21 1451  Sat Apr 21, 2021  1103 CONSULT with Dr.Groat, Rec floro stain, tonopen [BH]  1451 70 year old male here with elevated blood pressure and a headache.  He said his headache is mostly resolved now.  He had a recent eye surgery and also has not been taking his blood pressure medicine for a few days.  He has received his medication here and his blood pressure is improved.  He is comfortable with discharge and recommended follow-up with your primary care doctor and your ophthalmologist. [MB]    Clinical Course User Index [BH] Pope Brunty A, PA-C [MB] Terrilee Files, MD    CONSULT with Dr. Dione Booze made aware of findings. States he will see patient in office on Monday.>>>  Extremely difficult exam due to patient compliance with eye exam.  Discussed plan with patient and wife in room.  They are concerned about his elevated blood pressures.  His headache has resolved.  He has a nonfocal neuro exam without deficits.  Continues without chest pain or  shortness of breath.  We will try additional medication.   Patient was given single dose IV medication with significant improvement in blood pressures.  At this time I have low suspicion for hypertensive urgency or emergency.  Discussed compliance with medications at home, close follow-up outpatient.  Return for new or worsening of symptoms.  The patient has been appropriately medically screened and/or stabilized in the ED. I have low suspicion for any other emergent medical condition which would require further screening, evaluation or treatment in the ED or require inpatient management.  Patient is hemodynamically stable and in no acute distress.  Patient able to ambulate in department prior to ED.  Evaluation does not show acute pathology that would require ongoing or additional emergent interventions while in the emergency department or further inpatient treatment.  I have discussed the diagnosis with the patient and answered all questions.  Pain is been managed while in the emergency department and patient has no further complaints prior to discharge.  Patient is comfortable with plan discussed in room and is stable for discharge at this time.  I have discussed strict return precautions for returning to the emergency department.  Patient was encouraged to follow-up with PCP/specialist refer to at discharge.    MDM Rules/Calculators/A&P                           Final Clinical Impression(s) / ED Diagnoses Final diagnoses:  Hypertension, unspecified type  Acute nonintractable headache, unspecified headache type    Rx / DC Orders ED Discharge Orders     None        Apoorva Bugay A, PA-C 04/21/21 1452    Terrilee Files, MD 04/21/21 1752

## 2021-04-30 ENCOUNTER — Ambulatory Visit: Payer: Medicare Other | Admitting: Cardiology

## 2021-05-03 ENCOUNTER — Other Ambulatory Visit: Payer: Self-pay

## 2021-05-03 ENCOUNTER — Encounter: Payer: Self-pay | Admitting: Cardiology

## 2021-05-03 ENCOUNTER — Ambulatory Visit: Payer: Medicare Other | Admitting: Cardiology

## 2021-05-03 VITALS — BP 132/78 | HR 56 | Ht 73.0 in | Wt 221.1 lb

## 2021-05-03 DIAGNOSIS — E119 Type 2 diabetes mellitus without complications: Secondary | ICD-10-CM

## 2021-05-03 DIAGNOSIS — I10 Essential (primary) hypertension: Secondary | ICD-10-CM

## 2021-05-03 DIAGNOSIS — E782 Mixed hyperlipidemia: Secondary | ICD-10-CM

## 2021-05-03 NOTE — Progress Notes (Signed)
Cardiology Office Note:    Date:  05/03/2021   ID:  Joseph Lamb, DOB 1951/10/01, MRN [id number]  PCP:  Lupita Raider, MD  Cardiologist:  Garwin Brothers, MD   Referring MD: Lupita Raider, MD    ASSESSMENT:    1. Essential hypertension   2. Mixed hyperlipidemia   3. Type 2 diabetes mellitus without complication, without long-term current use of insulin (HCC)    PLAN:    In order of problems listed above:  Primary prevention stressed with the patient.  Importance of compliance with diet medication stressed any vocalized understanding. Essential hypertension: Blood pressure stable and diet was emphasized.  Lifestyle modification was urged.  Compliance with medication stressed.  He has issues with memory loss and I told his wife to be cognizant of his medication intake and possibly get a structure for pillbox so they can monitor medication intake.  She vocalized understanding and will do so. Mixed dyslipidemia: Diet was emphasized and lifestyle modification was urged.  Lipids reviewed. Diabetes mellitus: Managed by primary care physician. Patient and wife had multiple questions and they were answered to their satisfaction and emergency room records were reviewed and discussed with him.   Medication Adjustments/Labs and Tests Ordered: Current medicines are reviewed at length with the patient today.  Concerns regarding medicines are outlined above.  No orders of the defined types were placed in this encounter.  No orders of the defined types were placed in this encounter.    No chief complaint on file.    History of Present Illness:    [name] is a 70 y.o. male.  Patient has past medical history of essential hypertension, mixed dyslipidemia and diabetes mellitus.  He has issues with memory loss.  He recently had eye surgery.  He forgot to take his medications and went to the hospital with high blood pressure.  He was treated appropriately and blood pressure was  controlled and released.  Subsequently is done fine.  No chest pain orthopnea or PND.  His wife gave accompanies him for the visit today.  At the time of my evaluation, the patient is alert awake oriented and in no distress.  Past Medical History:  Diagnosis Date   Arrhythmia    Diabetes mellitus type 2, uncomplicated (HCC) 06/14/2015   Dizzy spells 03/08/2020   Essential hypertension 06/14/2015   High cholesterol    Hyperlipidemia 06/14/2015   Hypertension    Memory loss 06/14/2015   Pre-diabetes    Sinus bradycardia 03/08/2020    Past Surgical History:  Procedure Laterality Date   PROSTATE SURGERY      Current Medications: Current Meds  Medication Sig   amLODipine (NORVASC) 5 MG tablet Take 1 tablet (5 mg total) by mouth daily.   aspirin EC 81 MG tablet Take 1 tablet (81 mg total) by mouth daily.   donepezil (ARICEPT) 10 MG tablet Take 1 tablet (10 mg total) by mouth at bedtime.   latanoprost (XALATAN) 0.005 % ophthalmic solution Place 1 drop into both eyes at bedtime.   pravastatin (PRAVACHOL) 10 MG tablet Take 1 tablet (10 mg total) by mouth daily.   sertraline (ZOLOFT) 50 MG tablet Take 50 mg by mouth daily.   tadalafil (CIALIS) 20 MG tablet Take 20 mg by mouth daily as needed for erectile dysfunction.   TRULICITY 1.5 MG/0.5ML SOPN Inject 1.5 mg into the skin once a week.     Allergies:   Lisinopril, Crestor [rosuvastatin calcium], and Simvastatin   Social History  Socioeconomic History   Marital status: Married    Spouse name: Not on file   Number of children: 2   Years of education: 15   Highest education level: Not on file  Occupational History   Occupation: Retired  Tobacco Use   Smoking status: Never   Smokeless tobacco: Never  Vaping Use   Vaping Use: Never used  Substance and Sexual Activity   Alcohol use: Not Currently    Alcohol/week: 0.0 standard drinks    Comment: rare   Drug use: No   Sexual activity: Not on file  Other Topics Concern   Not on file   Social History Narrative   Right handed   One story home   Drinks no caffeine, occ soda.   Lives with wife and son   Social Determinants of Health   Financial Resource Strain: Not on file  Food Insecurity: Not on file  Transportation Needs: Not on file  Physical Activity: Not on file  Stress: Not on file  Social Connections: Not on file     Family History: The patient's family history includes Diabetes in his father and mother.  ROS:   Please see the history of present illness.    All other systems reviewed and are negative.  EKGs/Labs/Other Studies Reviewed:    The following studies were reviewed today: I discussed my findings with the patient at extensive length and hospital lab work and emergency room visit records were reviewed.   Recent Labs: 04/21/2021: BUN 12; Creatinine, Ser 1.20; Hemoglobin 16.1; Platelets 251; Potassium 3.7; Sodium 137  Recent Lipid Panel No results found for: CHOL, TRIG, HDL, CHOLHDL, VLDL, LDLCALC, LDLDIRECT  Physical Exam:    VS:  BP 132/78 (BP Location: Left Arm, Patient Position: Sitting)   Pulse (!) 56   Ht 6\' 1"  (1.854 m)   Wt 221 lb 1.3 oz (100.3 kg)   SpO2 97%   BMI 29.17 kg/m     Wt Readings from Last 3 Encounters:  05/03/21 221 lb 1.3 oz (100.3 kg)  04/21/21 253 lb 8.5 oz (115 kg)  03/07/21 225 lb (102.1 kg)     GEN: Patient is in no acute distress HEENT: Normal NECK: No JVD; No carotid bruits LYMPHATICS: No lymphadenopathy CARDIAC: Hear sounds regular, 2/6 systolic murmur at the apex. RESPIRATORY:  Clear to auscultation without rales, wheezing or rhonchi  ABDOMEN: Soft, non-tender, non-distended MUSCULOSKELETAL:  No edema; No deformity  SKIN: Warm and dry NEUROLOGIC:  Alert and oriented x 3 PSYCHIATRIC:  Normal affect   Signed, 05/07/21, MD  05/03/2021 2:04 PM    [facility]

## 2021-05-03 NOTE — Patient Instructions (Signed)

## 2021-05-09 ENCOUNTER — Ambulatory Visit: Payer: Medicare Other | Admitting: Neurology

## 2021-05-09 ENCOUNTER — Encounter: Payer: Self-pay | Admitting: Neurology

## 2021-05-09 ENCOUNTER — Other Ambulatory Visit: Payer: Self-pay

## 2021-05-09 VITALS — BP 121/74 | HR 57 | Ht 73.0 in | Wt 222.2 lb

## 2021-05-09 DIAGNOSIS — F0150 Vascular dementia without behavioral disturbance: Secondary | ICD-10-CM | POA: Diagnosis not present

## 2021-05-09 DIAGNOSIS — F01A0 Vascular dementia, mild, without behavioral disturbance, psychotic disturbance, mood disturbance, and anxiety: Secondary | ICD-10-CM

## 2021-05-09 DIAGNOSIS — R251 Tremor, unspecified: Secondary | ICD-10-CM

## 2021-05-09 MED ORDER — DONEPEZIL HCL 10 MG PO TABS: 10.0000 mg | ORAL_TABLET | Freq: Every day | ORAL | 3 refills | Status: DC

## 2021-05-09 NOTE — Patient Instructions (Signed)
Continue Donepezil 10mg  daily  2. Start using a pillbox and have your wife check behind with medications  3. Follow-up in 6 months, call for any changes   FALL PRECAUTIONS: Be cautious when walking. Scan the area for obstacles that may increase the risk of trips and falls. When getting up in the mornings, sit up at the edge of the bed for a few minutes before getting out of bed. Consider elevating the bed at the head end to avoid drop of blood pressure when getting up. Walk always in a well-lit room (use night lights in the walls). Avoid area rugs or power cords from appliances in the middle of the walkways. Use a walker or a cane if necessary and consider physical therapy for balance exercise. Get your eyesight checked regularly.  FINANCIAL OVERSIGHT: Supervision, especially oversight when making financial decisions or transactions is also recommended as difficulties arise.  HOME SAFETY: Consider the safety of the kitchen when operating appliances like stoves, microwave oven, and blender. Consider having supervision and share cooking responsibilities until no longer able to participate in those. Accidents with firearms and other hazards in the house should be identified and addressed as well.  DRIVING: Regarding driving, in patients with progressive memory problems, driving will be impaired. We advise to have someone else do the driving if trouble finding directions or if minor accidents are reported. Independent driving assessment is available to determine safety of driving.  ABILITY TO BE LEFT ALONE: If patient is unable to contact 911 operator, consider using LifeLine, or when the need is there, arrange for someone to stay with patients. Smoking is a fire hazard, consider supervision or cessation. Risk of wandering should be assessed by caregiver and if detected at any point, supervision and safe proof recommendations should be instituted.  MEDICATION SUPERVISION: Inability to self-administer  medication needs to be constantly addressed. Implement a mechanism to ensure safe administration of the medications.  RECOMMENDATIONS FOR ALL PATIENTS WITH MEMORY PROBLEMS: 1. Continue to exercise (Recommend 30 minutes of walking everyday, or 3 hours every week) 2. Increase social interactions - continue going to [place] and enjoy social gatherings with friends and family 3. Eat healthy, avoid fried foods and eat more fruits and vegetables 4. Maintain adequate blood pressure, blood sugar, and blood cholesterol level. Reducing the risk of stroke and cardiovascular disease also helps promoting better memory. 5. Avoid stressful situations. Live a simple life and avoid aggravations. Organize your time and prepare for the next day in anticipation. 6. Sleep well, avoid any interruptions of sleep and avoid any distractions in the bedroom that may interfere with adequate sleep quality 7. Avoid sugar, avoid sweets as there is a strong link between excessive sugar intake, diabetes, and cognitive impairment We discussed the Mediterranean diet, which has been shown to help patients reduce the risk of progressive memory disorders and reduces cardiovascular risk. This includes eating fish, eat fruits and green leafy vegetables, nuts like almonds and hazelnuts, walnuts, and also use olive oil. Avoid fast foods and fried foods as much as possible. Avoid sweets and sugar as sugar use has been linked to worsening of memory function.  There is always a concern of gradual progression of memory problems. If this is the case, then we may need to adjust level of care according to patient needs. Support, both to the patient and caregiver, should then be put into place.       Mediterranean Diet  Why follow it? Research shows. Those who follow the Mediterranean  diet have a reduced risk of heart disease  The diet is associated with a reduced incidence of Parkinson's and Alzheimer's diseases People following the diet may  have longer life expectancies and lower rates of chronic diseases  The Dietary Guidelines for Americans recommends the Mediterranean diet as an eating plan to promote health and prevent disease  What Is the Mediterranean Diet?  Healthy eating plan based on typical foods and recipes of Mediterranean-style cooking The diet is primarily a plant based diet; these foods should make up a majority of meals   Starches - Plant based foods should make up a majority of meals - They are an important sources of vitamins, minerals, energy, antioxidants, and fiber - Choose whole grains, foods high in fiber and minimally processed items  - Typical grain sources include wheat, oats, barley, corn, brown rice, bulgar, farro, millet, polenta, couscous  - Various types of beans include chickpeas, lentils, fava beans, black beans, white beans   Fruits  Veggies - Large quantities of antioxidant rich fruits & veggies; 6 or more servings  - Vegetables can be eaten raw or lightly drizzled with oil and cooked  - Vegetables common to the traditional Mediterranean Diet include: artichokes, arugula, beets, broccoli, brussel sprouts, cabbage, carrots, celery, collard greens, cucumbers, eggplant, kale, leeks, lemons, lettuce, mushrooms, okra, onions, peas, peppers, potatoes, pumpkin, radishes, rutabaga, shallots, spinach, sweet potatoes, turnips, zucchini - Fruits common to the Mediterranean Diet include: apples, apricots, avocados, cherries, clementines, dates, figs, grapefruits, grapes, melons, nectarines, oranges, peaches, pears, pomegranates, strawberries, tangerines  Fats - Replace butter and margarine with healthy oils, such as olive oil, canola oil, and tahini  - Limit nuts to no more than a handful a day  - Nuts include walnuts, almonds, pecans, pistachios, pine nuts  - Limit or avoid candied, honey roasted or heavily salted nuts - Olives are central to the [employer] - can be eaten whole or used in a variety of  dishes   Meats Protein - Limiting red meat: no more than a few times a month - When eating red meat: choose lean cuts and keep the portion to the size of deck of cards - Eggs: approx. 0 to 4 times a week  - Fish and lean poultry: at least 2 a week  - Healthy protein sources include, chicken, [place], lean beef, lamb - Increase intake of seafood such as tuna, salmon, trout, mackerel, shrimp, scallops - Avoid or limit high fat processed meats such as sausage and bacon  Dairy - Include moderate amounts of low fat dairy products  - Focus on healthy dairy such as fat free yogurt, skim milk, low or reduced fat cheese - Limit dairy products higher in fat such as whole or 2% milk, cheese, ice cream  Alcohol - Moderate amounts of red wine is ok  - No more than 5 oz daily for women (all ages) and men older than age 39  - No more than 10 oz of wine daily for men younger than 27  Other - Limit sweets and other desserts  - Use herbs and spices instead of salt to flavor foods  - Herbs and spices common to the traditional Mediterranean Diet include: basil, bay leaves, chives, cloves, cumin, fennel, garlic, lavender, marjoram, mint, oregano, parsley, pepper, rosemary, sage, savory, sumac, tarragon, thyme   It's not just a diet, it's a lifestyle:  The Mediterranean diet includes lifestyle factors typical of those in the region  Foods, drinks and meals are best eaten  with others and savored Daily physical activity is important for overall good health This could be strenuous exercise like running and aerobics This could also be more leisurely activities such as walking, housework, yard-work, or taking the stairs Moderation is the key; a balanced and healthy diet accommodates most foods and drinks Consider portion sizes and frequency of consumption of certain foods   Meal Ideas & Options:  Breakfast:  Whole wheat toast or whole wheat English muffins with peanut butter & hard boiled egg Steel cut oats topped  with apples & cinnamon and skim milk  Fresh fruit: banana, strawberries, melon, berries, peaches  Smoothies: strawberries, bananas, greek yogurt, peanut butter Low fat greek yogurt with blueberries and granola  Egg white omelet with spinach and mushrooms Breakfast couscous: whole wheat couscous, apricots, skim milk, cranberries  Sandwiches:  Hummus and grilled vegetables (peppers, zucchini, squash) on whole wheat bread   Grilled chicken on whole wheat pita with lettuce, tomatoes, cucumbers or tzatziki  [place] salad on whole wheat bread: tuna salad made with greek yogurt, olives, red peppers, capers, green onions Garlic rosemary lamb pita: lamb sauted with garlic, rosemary, salt & pepper; add lettuce, cucumber, greek yogurt to pita - flavor with lemon juice and black pepper  Seafood:  Mediterranean grilled salmon, seasoned with garlic, basil, parsley, lemon juice and black pepper Shrimp, lemon, and spinach whole-grain pasta salad made with low fat greek yogurt  Seared scallops with lemon orzo  Seared tuna steaks seasoned salt, pepper, coriander topped with tomato mixture of olives, tomatoes, olive oil, minced garlic, parsley, green onions and cappers  Meats:  Herbed greek chicken salad with kalamata olives, cucumber, feta  Red bell peppers stuffed with spinach, bulgur, lean ground beef (or lentils) & topped with feta   Kebabs: skewers of chicken, tomatoes, onions, zucchini, squash  [place] burgers: made with red onions, mint, dill, lemon juice, feta cheese topped with roasted red peppers Vegetarian Cucumber salad: cucumbers, artichoke hearts, celery, red onion, feta cheese, tossed in olive oil & lemon juice  Hummus and whole grain pita points with a greek salad (lettuce, tomato, feta, olives, cucumbers, red onion) Lentil soup with celery, carrots made with vegetable broth, garlic, salt and pepper  Tabouli salad: parsley, bulgur, mint, scallions, cucumbers, tomato, radishes, lemon juice, olive  oil, salt and pepper.

## 2021-05-09 NOTE — Progress Notes (Signed)
NEUROLOGY FOLLOW UP OFFICE NOTE  ISAIR INABINET [id number] 1950-10-18  HISTORY OF PRESENT ILLNESS: I had the pleasure of seeing Joseph Lamb in follow-up in the neurology clinic on 05/09/2021.  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 He is alone in the office today.  Records and images were personally reviewed where available.  On his last visit, his wife reported worning memory, Donepezil increased to 10mg  daily. He feels his memory is pretty good. He denies getting lost driving. He states he got slack with his medications a month ago and was in the ER for hypertension, since then he has been taking medications regularly. His wife manages finances. He is independent with dressing and bathing. His main concern today are tremors. He mostly notices it in his right hand but they come and go, sometimes he feels it and can stop it. It does not bother him so much when doing activities. No headaches, dizziness, vision changes, no falls. Sleep is good.    History on Initial Assessment 04/05/2020: This is a 70 year old right-handed man with a history of hypertension, diabetes, hyperlipidemia, presenting for evaluation of memory changes. He was evaluated in our office in September 2016 for similar concerns, MOCA score at that time was 29/30. He was also evaluated by neurologist Dr. October 2016 in 2013 for memory loss that started around 2010 or 2011. MOCA score 28/30 in 2013.  MRI brain in 2012 showed mild diffuse volume loss, mild to moderate chronic microvascular disease, a small area of cortical encephalomalacia in the right perirolandic cortex. He has been taking Donepezil 5mg  daily since 2013, however on his visit with Dr. last March, he reported he was not taking Donepezil and Sertraline regularly because he did not think he needed it. It was unclear if this was due to stubbornness or cognitive decline, his wife continued to report worsening memory and  behavior. He states he has been taking medications regularly since March, he feels his memory is "fair, not perfect." He remembers "when I want to remember." His wife tells him he forgets to do things. He denies getting lost driving. He manages his own medications and occasionally misses doses when "I get lazy and go to sleep." He misplaces things frequently. If he is not careful, he has burned food. His wife manages finances. He feels more laidback since taking the sertraline regularly. He denies any hallucinations. Sleep is good.   He is concerned about occasional bilateral hand tremors that started 4-5 months ago. He has noticed a little weakness in his left hand. Tremors are noticeable when he is using a pen or fork. No family history of tremors. He denies any headaches, diplopia, dysarthria/dysphagia, neck/back pain, focal numbness/tingling, bowel/bladder dysfunction, anosmia. No family history of dementia, no history of significant head injuries or alcohol use.    Diagnostic Data: MRI brain with and without contrast done 05/2020 did not show any acute changes. There was moderate chronic microvascular disease, significantly progressed since MRI brain from 2002.   Neuropsychological testing in August 2021 indicated Mild vascular neurocognitive disorder, it was also noted that longstanding differences of opinion/conflict between him and his wife are coloring family report of symptoms.    PAST MEDICAL HISTORY: Past Medical History:  Diagnosis Date   Arrhythmia    Diabetes mellitus type 2, uncomplicated (HCC) 06/14/2015   Dizzy spells 03/08/2020   Essential hypertension 06/14/2015   High cholesterol    Hyperlipidemia 06/14/2015   Hypertension  Memory loss 06/14/2015   Pre-diabetes    Sinus bradycardia 03/08/2020    MEDICATIONS: Current Outpatient Medications on File Prior to Visit  Medication Sig Dispense Refill   amLODipine (NORVASC) 5 MG tablet Take 1 tablet (5 mg total) by mouth daily. 180  tablet 3   aspirin EC 81 MG tablet Take 1 tablet (81 mg total) by mouth daily. 90 tablet 3   donepezil (ARICEPT) 10 MG tablet Take 1 tablet (10 mg total) by mouth at bedtime. 90 tablet 3   latanoprost (XALATAN) 0.005 % ophthalmic solution Place 1 drop into both eyes at bedtime.     pravastatin (PRAVACHOL) 10 MG tablet Take 1 tablet (10 mg total) by mouth daily. 90 tablet 3   sertraline (ZOLOFT) 50 MG tablet Take 50 mg by mouth daily.     tadalafil (CIALIS) 20 MG tablet Take 20 mg by mouth daily as needed for erectile dysfunction.     TRULICITY 1.5 MG/0.5ML SOPN Inject 1.5 mg into the skin once a week.  0   No current facility-administered medications on file prior to visit.    ALLERGIES: Allergies  Allergen Reactions   Lisinopril Cough   Crestor [Rosuvastatin Calcium]     " fatigue"    Simvastatin Other (See Comments)    "fatigue"    FAMILY HISTORY: Family History  Problem Relation Age of Onset   Diabetes Mother    Diabetes Father     SOCIAL HISTORY: Social History   Socioeconomic History   Marital status: Married    Spouse name: Not on file   Number of children: 2   Years of education: 15   Highest education level: Not on file  Occupational History   Occupation: Retired  Tobacco Use   Smoking status: Never   Smokeless tobacco: Never  Building services engineer Use: Never used  Substance and Sexual Activity   Alcohol use: Not Currently    Alcohol/week: 0.0 standard drinks    Comment: rare   Drug use: No   Sexual activity: Not on file  Other Topics Concern   Not on file  Social History Narrative   Right handed   One story home   Drinks no caffeine, occ soda.   Lives with wife and son   Social Determinants of Health   Financial Resource Strain: Not on file  Food Insecurity: Not on file  Transportation Needs: Not on file  Physical Activity: Not on file  Stress: Not on file  Social Connections: Not on file  Intimate Partner Violence: Not on file      PHYSICAL EXAM: Vitals:   05/09/21 1134  BP: 121/74  Pulse: (!) 57  SpO2: 97%   General: No acute distress Head:  Normocephalic/atraumatic Skin/Extremities: No rash, no edema Neurological Exam: alert and awake. No aphasia or dysarthria. Fund of knowledge is appropriate. Attention and concentration are normal.   Cranial nerves: Pupils equal, round. Extraocular movements intact with no nystagmus. Visual fields full.  No facial asymmetry.  Motor: Bulk and tone normal, mild coghweeling with distraction R>L, muscle strength 5/5 throughout with no pronator drift.   Finger to nose testing intact.  Gait: able to stand with arms crossed over chest, gait narrow-based and steady with fair arm swing, able to tandem walk adequately.  Romberg negative. No postural instability. Good finger and foot taps. +resting tremor on the right thumb. No postural or endpoint tremor.   IMPRESSION: This is a pleasant 70 yo RH man with a history of  hypertension, hyperlipidemia, diabetes, with Mild vascular neurocognitive disorder diagnosed on Neuropsychological evaluation in August 2021. MRI brain showed moderate chronic microvascular disease. Memory overall stable, however he is alone in the office with no family to corroborate history. Continue Donepezil 10mg  daily, advised to start using a pillbox and have his wife check behind him. Continue to monitor driving. Continue control of vascular risk factors, discussed physical exercise, brain stimulation exercises, and MIND diet for overall brain health. We discussed the tremors, he has occasional right thumb resting tremor and mild cogwheeling in the office today, otherwise no other tremor and no other parks[place] signs, continue to monitor. Follow-up in 6 months, call for any changes.   Thank you for allowing me to participate in his care.  Please do not hesitate to call for any questions or concerns.   , M.D.   CC: Dr. Patrcia Dolly

## 2021-08-16 ENCOUNTER — Encounter: Payer: Self-pay | Admitting: Neurology

## 2021-08-31 ENCOUNTER — Inpatient Hospital Stay (HOSPITAL_COMMUNITY)
Admission: EM | Admit: 2021-08-31 | Discharge: 2021-09-03 | DRG: 418 | Disposition: A | Discharge status: Home Health | Payer: Medicare Other | Attending: General Surgery | Admitting: General Surgery

## 2021-08-31 ENCOUNTER — Emergency Department (HOSPITAL_COMMUNITY): Payer: Medicare Other | Admitting: Certified Registered Nurse Anesthetist

## 2021-08-31 ENCOUNTER — Emergency Department (HOSPITAL_COMMUNITY): Payer: Medicare Other

## 2021-08-31 ENCOUNTER — Encounter (HOSPITAL_COMMUNITY)
Admission: EM | Disposition: A | Discharge status: Home Health | Payer: Self-pay | Source: Home / Self Care | Attending: Family Medicine

## 2021-08-31 ENCOUNTER — Other Ambulatory Visit: Payer: Self-pay

## 2021-08-31 ENCOUNTER — Encounter (HOSPITAL_COMMUNITY): Payer: Self-pay | Admitting: *Deleted

## 2021-08-31 DIAGNOSIS — Z79899 Other long term (current) drug therapy: Secondary | ICD-10-CM

## 2021-08-31 DIAGNOSIS — Z888 Allergy status to other drugs, medicaments and biological substances status: Secondary | ICD-10-CM

## 2021-08-31 DIAGNOSIS — K82A1 Gangrene of gallbladder in cholecystitis: Secondary | ICD-10-CM | POA: Diagnosis present

## 2021-08-31 DIAGNOSIS — R109 Unspecified abdominal pain: Secondary | ICD-10-CM

## 2021-08-31 DIAGNOSIS — E785 Hyperlipidemia, unspecified: Secondary | ICD-10-CM | POA: Diagnosis present

## 2021-08-31 DIAGNOSIS — K8000 Calculus of gallbladder with acute cholecystitis without obstruction: Secondary | ICD-10-CM | POA: Diagnosis not present

## 2021-08-31 DIAGNOSIS — E119 Type 2 diabetes mellitus without complications: Secondary | ICD-10-CM | POA: Diagnosis present

## 2021-08-31 DIAGNOSIS — E7800 Pure hypercholesterolemia, unspecified: Secondary | ICD-10-CM | POA: Diagnosis present

## 2021-08-31 DIAGNOSIS — Z20822 Contact with and (suspected) exposure to covid-19: Secondary | ICD-10-CM | POA: Diagnosis present

## 2021-08-31 DIAGNOSIS — G250 Essential tremor: Secondary | ICD-10-CM | POA: Diagnosis present

## 2021-08-31 DIAGNOSIS — R413 Other amnesia: Secondary | ICD-10-CM | POA: Diagnosis present

## 2021-08-31 DIAGNOSIS — Z7982 Long term (current) use of aspirin: Secondary | ICD-10-CM

## 2021-08-31 DIAGNOSIS — H409 Unspecified glaucoma: Secondary | ICD-10-CM

## 2021-08-31 DIAGNOSIS — K819 Cholecystitis, unspecified: Secondary | ICD-10-CM | POA: Diagnosis present

## 2021-08-31 DIAGNOSIS — K8020 Calculus of gallbladder without cholecystitis without obstruction: Secondary | ICD-10-CM

## 2021-08-31 DIAGNOSIS — I480 Paroxysmal atrial fibrillation: Secondary | ICD-10-CM | POA: Diagnosis present

## 2021-08-31 DIAGNOSIS — I10 Essential (primary) hypertension: Secondary | ICD-10-CM | POA: Diagnosis present

## 2021-08-31 DIAGNOSIS — N179 Acute kidney failure, unspecified: Secondary | ICD-10-CM | POA: Diagnosis present

## 2021-08-31 DIAGNOSIS — G4733 Obstructive sleep apnea (adult) (pediatric): Secondary | ICD-10-CM | POA: Diagnosis present

## 2021-08-31 DIAGNOSIS — I499 Cardiac arrhythmia, unspecified: Secondary | ICD-10-CM | POA: Diagnosis present

## 2021-08-31 HISTORY — PX: CHOLECYSTECTOMY: SHX55

## 2021-08-31 HISTORY — DX: Unspecified glaucoma: H40.9

## 2021-08-31 HISTORY — DX: Calculus of gallbladder without cholecystitis without obstruction: K80.20

## 2021-08-31 HISTORY — DX: Essential tremor: G25.0

## 2021-08-31 HISTORY — DX: Unspecified atrial fibrillation: I48.91

## 2021-08-31 LAB — RESP PANEL BY RT-PCR (FLU A&B, COVID) ARPGX2
Influenza A by PCR: NEGATIVE
Influenza B by PCR: NEGATIVE
SARS Coronavirus 2 by RT PCR: NEGATIVE

## 2021-08-31 LAB — URINALYSIS, ROUTINE W REFLEX MICROSCOPIC
Bilirubin Urine: NEGATIVE
Glucose, UA: NEGATIVE mg/dL
Hgb urine dipstick: NEGATIVE
Ketones, ur: NEGATIVE mg/dL
Leukocytes,Ua: NEGATIVE
Nitrite: NEGATIVE
Protein, ur: NEGATIVE mg/dL
Specific Gravity, Urine: 1.013 (ref 1.005–1.030)
pH: 5 (ref 5.0–8.0)

## 2021-08-31 LAB — COMPREHENSIVE METABOLIC PANEL
ALT: 14 U/L (ref 0–44)
AST: 18 U/L (ref 15–41)
Albumin: 4 g/dL (ref 3.5–5.0)
Alkaline Phosphatase: 93 U/L (ref 38–126)
Anion gap: 8 (ref 5–15)
BUN: 15 mg/dL (ref 8–23)
CO2: 27 mmol/L (ref 22–32)
Calcium: 9.5 mg/dL (ref 8.9–10.3)
Chloride: 102 mmol/L (ref 98–111)
Creatinine, Ser: 1.51 mg/dL — ABNORMAL HIGH (ref 0.61–1.24)
GFR, Estimated: 49 mL/min — ABNORMAL LOW (ref >60)
Glucose, Bld: 153 mg/dL — ABNORMAL HIGH (ref 70–99)
Potassium: 3.8 mmol/L (ref 3.5–5.1)
Sodium: 137 mmol/L (ref 135–145)
Total Bilirubin: 0.9 mg/dL (ref 0.3–1.2)
Total Protein: 7.6 g/dL (ref 6.5–8.1)

## 2021-08-31 LAB — GLUCOSE, CAPILLARY
Glucose-Capillary: 149 mg/dL — ABNORMAL HIGH (ref 70–99)
Glucose-Capillary: 151 mg/dL — ABNORMAL HIGH (ref 70–99)
Glucose-Capillary: 153 mg/dL — ABNORMAL HIGH (ref 70–99)
Glucose-Capillary: 127 mg/dL — ABNORMAL HIGH (ref 70–99)

## 2021-08-31 LAB — CBC
HCT: 48.1 % (ref 39.0–52.0)
Hemoglobin: 15.6 g/dL (ref 13.0–17.0)
MCH: 28.1 pg (ref 26.0–34.0)
MCHC: 32.4 g/dL (ref 30.0–36.0)
MCV: 86.7 fL (ref 80.0–100.0)
Platelets: 235 10*3/uL (ref 150–400)
RBC: 5.55 MIL/uL (ref 4.22–5.81)
RDW: 14.6 % (ref 11.5–15.5)
WBC: 10 10*3/uL (ref 4.0–10.5)
nRBC: 0 % (ref 0.0–0.2)

## 2021-08-31 LAB — LIPASE, BLOOD: Lipase: 39 U/L (ref 11–51)

## 2021-08-31 SURGERY — LAPAROSCOPIC CHOLECYSTECTOMY
Anesthesia: General | Site: Abdomen

## 2021-08-31 MED ORDER — PROPOFOL 10 MG/ML IV BOLUS
INTRAVENOUS | Status: DC | PRN
  Administered 2021-08-31: 150 mg via INTRAVENOUS

## 2021-08-31 MED ORDER — PROPOFOL 10 MG/ML IV BOLUS
INTRAVENOUS | Status: AC
  Filled 2021-08-31: qty 20

## 2021-08-31 MED ORDER — POLYETHYLENE GLYCOL 3350 17 G PO PACK: 17.0000 g | PACK | Freq: Every day | ORAL | Status: DC | PRN

## 2021-08-31 MED ORDER — ORAL CARE MOUTH RINSE: 15.0000 mL | Freq: Once | OROMUCOSAL | Status: AC

## 2021-08-31 MED ORDER — ONDANSETRON HCL 4 MG/2ML IJ SOLN
INTRAMUSCULAR | Status: DC | PRN
  Administered 2021-08-31: 4 mg via INTRAVENOUS

## 2021-08-31 MED ORDER — DIPHENHYDRAMINE HCL 50 MG/ML IJ SOLN: 12.5000 mg | Freq: Four times a day (QID) | INTRAMUSCULAR | Status: DC | PRN

## 2021-08-31 MED ORDER — DONEPEZIL HCL 5 MG PO TABS
5.0000 mg | ORAL_TABLET | Freq: Every day | ORAL | Status: DC
  Administered 2021-08-31 – 2021-09-02 (×3): 5 mg via ORAL
  Filled 2021-08-31 (×3): qty 1

## 2021-08-31 MED ORDER — PHENYLEPHRINE 40 MCG/ML (10ML) SYRINGE FOR IV PUSH (FOR BLOOD PRESSURE SUPPORT)
PREFILLED_SYRINGE | INTRAVENOUS | Status: DC | PRN
  Administered 2021-08-31: 80 ug via INTRAVENOUS
  Administered 2021-08-31: 700 ug via INTRAVENOUS

## 2021-08-31 MED ORDER — INSULIN ASPART 100 UNIT/ML IJ SOLN: 0.0000 [IU] | Freq: Every day | INTRAMUSCULAR | Status: DC

## 2021-08-31 MED ORDER — PHENYLEPHRINE 40 MCG/ML (10ML) SYRINGE FOR IV PUSH (FOR BLOOD PRESSURE SUPPORT)
PREFILLED_SYRINGE | INTRAVENOUS | Status: AC
  Filled 2021-08-31: qty 10

## 2021-08-31 MED ORDER — ONDANSETRON HCL 4 MG/2ML IJ SOLN
4.0000 mg | Freq: Once | INTRAMUSCULAR | Status: AC
  Administered 2021-08-31: 4 mg via INTRAVENOUS
  Filled 2021-08-31: qty 2

## 2021-08-31 MED ORDER — OXYCODONE HCL 5 MG PO TABS: 5.0000 mg | ORAL_TABLET | Freq: Once | ORAL | Status: DC | PRN

## 2021-08-31 MED ORDER — ONDANSETRON HCL 4 MG/2ML IJ SOLN: 4.0000 mg | Freq: Four times a day (QID) | INTRAMUSCULAR | Status: DC | PRN

## 2021-08-31 MED ORDER — ORAL CARE MOUTH RINSE: 15.0000 mL | Freq: Once | OROMUCOSAL | Status: DC

## 2021-08-31 MED ORDER — PRAVASTATIN SODIUM 10 MG PO TABS
20.0000 mg | ORAL_TABLET | Freq: Every day | ORAL | Status: DC
  Administered 2021-08-31 – 2021-09-02 (×3): 20 mg via ORAL
  Filled 2021-08-31 (×3): qty 2

## 2021-08-31 MED ORDER — ACETAMINOPHEN 500 MG PO TABS
1000.0000 mg | ORAL_TABLET | ORAL | Status: AC
  Administered 2021-08-31: 1000 mg via ORAL
  Filled 2021-08-31: qty 2

## 2021-08-31 MED ORDER — 0.9 % SODIUM CHLORIDE (POUR BTL) OPTIME
TOPICAL | Status: DC | PRN
  Administered 2021-08-31: 1000 mL

## 2021-08-31 MED ORDER — DOCUSATE SODIUM 100 MG PO CAPS
100.0000 mg | ORAL_CAPSULE | Freq: Two times a day (BID) | ORAL | Status: DC
  Administered 2021-08-31 – 2021-09-03 (×6): 100 mg via ORAL
  Filled 2021-08-31 (×6): qty 1

## 2021-08-31 MED ORDER — FENTANYL CITRATE PF 50 MCG/ML IJ SOSY
50.0000 ug | PREFILLED_SYRINGE | Freq: Once | INTRAMUSCULAR | Status: AC
  Administered 2021-08-31: 50 ug via INTRAVENOUS
  Filled 2021-08-31: qty 1

## 2021-08-31 MED ORDER — ONDANSETRON HCL 4 MG/2ML IJ SOLN: 4.0000 mg | Freq: Once | INTRAMUSCULAR | Status: DC | PRN

## 2021-08-31 MED ORDER — PANTOPRAZOLE SODIUM 40 MG IV SOLR
40.0000 mg | Freq: Every day | INTRAVENOUS | Status: DC
  Administered 2021-08-31: 40 mg via INTRAVENOUS
  Filled 2021-08-31: qty 40

## 2021-08-31 MED ORDER — FENTANYL CITRATE (PF) 100 MCG/2ML IJ SOLN: 25.0000 ug | INTRAMUSCULAR | Status: DC | PRN

## 2021-08-31 MED ORDER — FENTANYL CITRATE (PF) 250 MCG/5ML IJ SOLN
INTRAMUSCULAR | Status: DC | PRN
  Administered 2021-08-31 (×3): 50 ug via INTRAVENOUS

## 2021-08-31 MED ORDER — OXYCODONE HCL 5 MG/5ML PO SOLN: 5.0000 mg | Freq: Once | ORAL | Status: DC | PRN

## 2021-08-31 MED ORDER — AMLODIPINE BESYLATE 5 MG PO TABS
5.0000 mg | ORAL_TABLET | Freq: Every day | ORAL | Status: DC
  Administered 2021-08-31 – 2021-09-02 (×3): 5 mg via ORAL
  Filled 2021-08-31 (×3): qty 1

## 2021-08-31 MED ORDER — CHLORHEXIDINE GLUCONATE 0.12 % MT SOLN: 15.0000 mL | Freq: Once | OROMUCOSAL | Status: AC

## 2021-08-31 MED ORDER — LATANOPROST 0.005 % OP SOLN
1.0000 [drp] | Freq: Every day | OPHTHALMIC | Status: DC
  Administered 2021-08-31 – 2021-09-02 (×3): 1 [drp] via OPHTHALMIC
  Filled 2021-08-31: qty 2.5

## 2021-08-31 MED ORDER — ONDANSETRON HCL 4 MG/2ML IJ SOLN
INTRAMUSCULAR | Status: AC
  Filled 2021-08-31: qty 2

## 2021-08-31 MED ORDER — INSULIN ASPART 100 UNIT/ML IJ SOLN
0.0000 [IU] | Freq: Three times a day (TID) | INTRAMUSCULAR | Status: DC
  Administered 2021-08-31 – 2021-09-03 (×6): 3 [IU] via SUBCUTANEOUS

## 2021-08-31 MED ORDER — AMISULPRIDE (ANTIEMETIC) 5 MG/2ML IV SOLN: 10.0000 mg | Freq: Once | INTRAVENOUS | Status: DC | PRN

## 2021-08-31 MED ORDER — BUPIVACAINE HCL 0.25 % IJ SOLN
INTRAMUSCULAR | Status: DC | PRN
  Administered 2021-08-31: 30 mL

## 2021-08-31 MED ORDER — HEMOSTATIC AGENTS (NO CHARGE) OPTIME
TOPICAL | Status: DC | PRN
  Administered 2021-08-31: 1 via TOPICAL

## 2021-08-31 MED ORDER — IOHEXOL 300 MG/ML  SOLN
75.0000 mL | Freq: Once | INTRAMUSCULAR | Status: AC | PRN
  Administered 2021-08-31: 75 mL via INTRAVENOUS

## 2021-08-31 MED ORDER — SODIUM CHLORIDE 0.9 % IV SOLN
2.0000 g | INTRAVENOUS | Status: AC
  Administered 2021-08-31: 2 g via INTRAVENOUS
  Filled 2021-08-31: qty 20

## 2021-08-31 MED ORDER — LACTATED RINGERS IV SOLN: INTRAVENOUS | Status: DC

## 2021-08-31 MED ORDER — GABAPENTIN 300 MG PO CAPS
300.0000 mg | ORAL_CAPSULE | ORAL | Status: AC
  Administered 2021-08-31: 300 mg via ORAL
  Filled 2021-08-31: qty 1

## 2021-08-31 MED ORDER — ACETAMINOPHEN 325 MG PO TABS
650.0000 mg | ORAL_TABLET | Freq: Four times a day (QID) | ORAL | Status: DC | PRN
  Administered 2021-08-31: 650 mg via ORAL
  Filled 2021-08-31: qty 2

## 2021-08-31 MED ORDER — ENOXAPARIN SODIUM 40 MG/0.4ML IJ SOSY
40.0000 mg | PREFILLED_SYRINGE | INTRAMUSCULAR | Status: DC
  Administered 2021-09-01 – 2021-09-03 (×3): 40 mg via SUBCUTANEOUS
  Filled 2021-08-31 (×3): qty 0.4

## 2021-08-31 MED ORDER — SODIUM CHLORIDE 0.9 % IR SOLN
Status: DC | PRN
  Administered 2021-08-31: 1000 mL

## 2021-08-31 MED ORDER — OXYCODONE HCL 5 MG PO TABS
5.0000 mg | ORAL_TABLET | ORAL | Status: DC | PRN
  Administered 2021-08-31 – 2021-09-02 (×4): 5 mg via ORAL
  Administered 2021-09-02 (×2): 10 mg via ORAL
  Administered 2021-09-03: 5 mg via ORAL
  Filled 2021-08-31: qty 2
  Filled 2021-08-31 (×7): qty 1

## 2021-08-31 MED ORDER — MORPHINE SULFATE (PF) 2 MG/ML IV SOLN: 2.0000 mg | INTRAVENOUS | Status: DC | PRN

## 2021-08-31 MED ORDER — SUCCINYLCHOLINE CHLORIDE 200 MG/10ML IV SOSY
PREFILLED_SYRINGE | INTRAVENOUS | Status: DC | PRN
  Administered 2021-08-31: 100 mg via INTRAVENOUS

## 2021-08-31 MED ORDER — HYDRALAZINE HCL 20 MG/ML IJ SOLN
5.0000 mg | Freq: Once | INTRAMUSCULAR | Status: AC
  Administered 2021-08-31: 5 mg via INTRAVENOUS
  Filled 2021-08-31: qty 1

## 2021-08-31 MED ORDER — DIPHENHYDRAMINE HCL 12.5 MG/5ML PO ELIX: 12.5000 mg | ORAL_SOLUTION | Freq: Four times a day (QID) | ORAL | Status: DC | PRN

## 2021-08-31 MED ORDER — LIDOCAINE 2% (20 MG/ML) 5 ML SYRINGE
INTRAMUSCULAR | Status: DC | PRN
  Administered 2021-08-31: 80 mg via INTRAVENOUS

## 2021-08-31 MED ORDER — SIMETHICONE 80 MG PO CHEW: 40.0000 mg | CHEWABLE_TABLET | Freq: Four times a day (QID) | ORAL | Status: DC | PRN

## 2021-08-31 MED ORDER — SODIUM CHLORIDE 0.9 % IV SOLN: INTRAVENOUS | Status: DC

## 2021-08-31 MED ORDER — ROCURONIUM BROMIDE 10 MG/ML (PF) SYRINGE
PREFILLED_SYRINGE | INTRAVENOUS | Status: AC
  Filled 2021-08-31: qty 10

## 2021-08-31 MED ORDER — HYDROCHLOROTHIAZIDE 25 MG PO TABS: 25.0000 mg | ORAL_TABLET | Freq: Every day | ORAL | Status: DC

## 2021-08-31 MED ORDER — ONDANSETRON 4 MG PO TBDP: 4.0000 mg | ORAL_TABLET | Freq: Four times a day (QID) | ORAL | Status: DC | PRN

## 2021-08-31 MED ORDER — CHLORHEXIDINE GLUCONATE 0.12 % MT SOLN: 15.0000 mL | Freq: Once | OROMUCOSAL | Status: DC

## 2021-08-31 MED ORDER — ROCURONIUM BROMIDE 10 MG/ML (PF) SYRINGE
PREFILLED_SYRINGE | INTRAVENOUS | Status: DC | PRN
  Administered 2021-08-31: 40 mg via INTRAVENOUS
  Administered 2021-08-31: 20 mg via INTRAVENOUS

## 2021-08-31 MED ORDER — HYDRALAZINE HCL 20 MG/ML IJ SOLN: 10.0000 mg | INTRAMUSCULAR | Status: DC | PRN

## 2021-08-31 MED ORDER — CHLORHEXIDINE GLUCONATE CLOTH 2 % EX PADS: 6.0000 | MEDICATED_PAD | Freq: Once | CUTANEOUS | Status: DC

## 2021-08-31 MED ORDER — FENTANYL CITRATE (PF) 250 MCG/5ML IJ SOLN
INTRAMUSCULAR | Status: AC
  Filled 2021-08-31: qty 5

## 2021-08-31 MED ORDER — CHLORHEXIDINE GLUCONATE 0.12 % MT SOLN
OROMUCOSAL | Status: AC
  Administered 2021-08-31: 15 mL via OROMUCOSAL
  Filled 2021-08-31: qty 15

## 2021-08-31 MED ORDER — LIDOCAINE 2% (20 MG/ML) 5 ML SYRINGE
INTRAMUSCULAR | Status: AC
  Filled 2021-08-31: qty 5

## 2021-08-31 MED ORDER — ACETAMINOPHEN 650 MG RE SUPP: 650.0000 mg | Freq: Four times a day (QID) | RECTAL | Status: DC | PRN

## 2021-08-31 MED ORDER — BUPIVACAINE HCL (PF) 0.25 % IJ SOLN
INTRAMUSCULAR | Status: AC
  Filled 2021-08-31: qty 30

## 2021-08-31 MED ORDER — SUCCINYLCHOLINE CHLORIDE 200 MG/10ML IV SOSY
PREFILLED_SYRINGE | INTRAVENOUS | Status: AC
  Filled 2021-08-31: qty 10

## 2021-08-31 MED ORDER — SUGAMMADEX SODIUM 200 MG/2ML IV SOLN
INTRAVENOUS | Status: DC | PRN
  Administered 2021-08-31: 200 mg via INTRAVENOUS

## 2021-08-31 SURGICAL SUPPLY — 46 items
ADH SKN CLS APL DERMABOND .7 (GAUZE/BANDAGES/DRESSINGS) ×1
APL PRP STRL LF DISP 70% ISPRP (MISCELLANEOUS) ×1
APPLIER CLIP 5 13 M/L LIGAMAX5 (MISCELLANEOUS) ×2
APR CLP MED LRG 5 ANG JAW (MISCELLANEOUS) ×1
BAG COUNTER SPONGE SURGICOUNT (BAG) ×2 IMPLANT
BAG SPEC RTRVL 10 TROC 200 (ENDOMECHANICALS) ×1
BAG SPNG CNTER NS LX DISP (BAG) ×1
BIOPATCH RED 1 DISK 7.0 (GAUZE/BANDAGES/DRESSINGS) ×2 IMPLANT
CANISTER SUCT 3000ML PPV (MISCELLANEOUS) ×2 IMPLANT
CHLORAPREP W/TINT 26 (MISCELLANEOUS) ×2 IMPLANT
CLIP APPLIE 5 13 M/L LIGAMAX5 (MISCELLANEOUS) ×1 IMPLANT
COVER SURGICAL LIGHT HANDLE (MISCELLANEOUS) ×2 IMPLANT
DERMABOND ADVANCED (GAUZE/BANDAGES/DRESSINGS) ×1
DERMABOND ADVANCED .7 DNX12 (GAUZE/BANDAGES/DRESSINGS) ×1 IMPLANT
DRAIN CHANNEL 19F RND (DRAIN) ×2 IMPLANT
DRSG TEGADERM 2-3/8X2-3/4 SM (GAUZE/BANDAGES/DRESSINGS) ×2 IMPLANT
ELECT REM PT RETURN 9FT ADLT (ELECTROSURGICAL) ×2
ELECTRODE REM PT RTRN 9FT ADLT (ELECTROSURGICAL) ×1 IMPLANT
GLOVE SRG 8 PF TXTR STRL LF DI (GLOVE) ×1 IMPLANT
GLOVE SURG ENC MOIS LTX SZ7.5 (GLOVE) ×2 IMPLANT
GLOVE SURG UNDER POLY LF SZ8 (GLOVE) ×2
GOWN STRL REUS W/ TWL LRG LVL3 (GOWN DISPOSABLE) ×2 IMPLANT
GOWN STRL REUS W/ TWL XL LVL3 (GOWN DISPOSABLE) ×1 IMPLANT
GOWN STRL REUS W/TWL LRG LVL3 (GOWN DISPOSABLE) ×4
GOWN STRL REUS W/TWL XL LVL3 (GOWN DISPOSABLE) ×2
GRASPER SUT TROCAR 14GX15 (MISCELLANEOUS) ×2 IMPLANT
KIT BASIN OR (CUSTOM PROCEDURE TRAY) ×2 IMPLANT
KIT TURNOVER KIT B (KITS) ×2 IMPLANT
NEEDLE INSUFFLATION 14GA 120MM (NEEDLE) ×2 IMPLANT
NS IRRIG 1000ML POUR BTL (IV SOLUTION) ×2 IMPLANT
PAD ARMBOARD 7.5X6 YLW CONV (MISCELLANEOUS) ×2 IMPLANT
POUCH RETRIEVAL ECOSAC 10 (ENDOMECHANICALS) ×1 IMPLANT
POUCH RETRIEVAL ECOSAC 10MM (ENDOMECHANICALS) ×2
SCISSORS LAP 5X35 DISP (ENDOMECHANICALS) ×2 IMPLANT
SET IRRIG TUBING LAPAROSCOPIC (IRRIGATION / IRRIGATOR) ×2 IMPLANT
SET TUBE SMOKE EVAC HIGH FLOW (TUBING) ×2 IMPLANT
SLEEVE ENDOPATH XCEL 5M (ENDOMECHANICALS) ×4 IMPLANT
SPECIMEN JAR SMALL (MISCELLANEOUS) ×2 IMPLANT
SUT MNCRL AB 4-0 PS2 18 (SUTURE) ×2 IMPLANT
TOWEL GREEN STERILE (TOWEL DISPOSABLE) ×2 IMPLANT
TOWEL GREEN STERILE FF (TOWEL DISPOSABLE) ×2 IMPLANT
TRAY LAPAROSCOPIC MC (CUSTOM PROCEDURE TRAY) ×2 IMPLANT
TROCAR XCEL NON-BLD 11X100MML (ENDOMECHANICALS) ×2 IMPLANT
TROCAR XCEL NON-BLD 5MMX100MML (ENDOMECHANICALS) ×2 IMPLANT
WARMER LAPAROSCOPE (MISCELLANEOUS) ×2 IMPLANT
WATER STERILE IRR 1000ML POUR (IV SOLUTION) ×2 IMPLANT

## 2021-08-31 NOTE — Assessment & Plan Note (Signed)
-  Continue Pravachol ?

## 2021-08-31 NOTE — Op Note (Signed)
Patient: Joseph Lamb (14-Jan-1951, [id number])  Date of Surgery: 08/31/2021   Preoperative Diagnosis: Cholecystitis   Postoperative Diagnosis: Gangrenous Cholecystitis   Surgical Procedure: LAPAROSCOPIC CHOLECYSTECTOMY:    Operative Team Members:  Surgeon(s) and Role:    * Joseph Lamb, Hyman Hopes, MD - Primary   Anesthesiologist: Mellody Dance, MD CRNA: Mayer Camel, CRNA; Lonia Mad, CRNA   Anesthesia: General   Fluids:  No intake/output data recorded.  Complications: None  Drains:  none   Specimen:  ID Type Source Tests Collected by Time Destination  1 : Gallbladder Tissue PATH Gallbladder SURGICAL PATHOLOGY Joseph Lamb, Hyman Hopes, MD 08/31/2021 1159      Disposition:  PACU - hemodynamically stable.  Plan of Care: Admit for overnight observation    Indications for Procedure: Joseph Lamb is a 70 y.o. male who presented with abdominal pain.  History, physical and imaging was concerning for cholecystitis.  Laparoscopic cholecystectomy was recommended for the patient.  The procedure itself, as well as the risks, benefits and alternatives were discussed with the patient.  Risks discussed included but were not limited to the risk of infection, bleeding, damage to nearby structures, need to convert to open procedure, incisional hernia, bile leak, common bile duct injury and the need for additional procedures or surgeries.  With this discussion complete and all questions answered the patient granted consent to proceed.  Findings: Gangrenous cholecystitis  Infection status: Patient: Joseph Lamb Emergency General Surgery Service Patient Case: Emergent Infection Present At Time Of Surgery (PATOS):  Infected bile decompressed and spilled from gallbladder   Description of Procedure:   On the date stated above, the patient was taken to the operating room suite and placed in supine positioning.  Sequential compression devices were placed on the lower extremities  to prevent blood clots.  General endotracheal anesthesia was induced. Preoperative antibiotics were given.  The patient's abdomen was prepped and draped in the usual sterile fashion.  A time-out was completed verifying the correct patient, procedure, positioning and equipment needed for the case.   A 5 mm incision was made in the right upper quadrant subcostally and the abdomen was entered using an optical technique with a 5 mm trocar.  There is no trauma underlying viscera with initial trocar placement.  The abdomen was insufflated to 15 mmHg.  3 additional trochars were placed down the midline one 5 mm at the umbilicus, one 5 mm in the mid epigastric area and one 11 mm subxiphoid.  The patient was placed in head up left side down positioning and the gallbladder was inspected.  It was paperthin walls with gangrenous changes.  There were not a lot of adhesions to the gallbladder.  The patient was then placed in head up, left side down positioning.  The gallbladder was identified and dissected free from its attachments to the omentum allowing the duodenum to fall away.  The infundibulum of the gallbladder was dissected free working laterally to medially.  The cystic duct and cystic artery were dissected free from surrounding connective tissue.  The infundibulum of the gallbladder was dissected off the cystic plate.  A critical view of safety was obtained with the cystic duct and cystic artery being cleared of connective tissues and clearly the only two structures entering into the gallbladder with the liver clearly visible behind.  Clips were then applied to the cystic duct and cystic artery and then these structures were divided.  The gallbladder was dissected off the cystic plate, placed in an  endocatch bag and removed from the 12 mm subxiphoid port site.  The clips were inspected and appeared effective.  The cystic plate was inspected and hemostasis was obtained using electrocautery.  A suction irrigator  was used to clean the operative field.  Snow topical hemostatic agent was placed under the gallbladder fossa to aid with hemostasis of the Lamb inflamed surface area of the gallbladder fossa.  A 78 French round channel drain was brought through the right sided incision and placed in the gallbladder fossa.  The drain was fixed to the skin with nylon suture.  Attention was turned to closure.  The 12 mm subxiphoid port site was closed using a 0-vicryl suture with 3 figure-of-eight sutures.  The abdomen was desufflated.  The skin was closed using 4-0 monocryl and dermabond.  All sponge and needle counts were correct at the conclusion of the case.    Joseph Raw, MD General, Bariatric, & Minimally Invasive Surgery [facility], [place]

## 2021-08-31 NOTE — Assessment & Plan Note (Signed)
Continue latanoprost ?

## 2021-08-31 NOTE — Anesthesia Procedure Notes (Signed)
Procedure Name: Intubation Date/Time: 08/31/2021 11:39 AM Performed by: Betha Loa, CRNA Pre-anesthesia Checklist: Patient identified, Emergency Drugs available, Suction available and Patient being monitored Patient Re-evaluated:Patient Re-evaluated prior to induction Oxygen Delivery Method: Circle System Utilized Preoxygenation: Pre-oxygenation with 100% oxygen Induction Type: IV induction Laryngoscope Size: Mac, 4 and Glidescope Grade View: Grade I Tube type: Oral Number of attempts: 1 Airway Equipment and Method: Oral airway, Video-laryngoscopy and Rigid stylet Placement Confirmation: ETT inserted through vocal cords under direct vision, positive ETCO2 and breath sounds checked- equal and bilateral Secured at: 23 cm Tube secured with: Tape Dental Injury: Teeth and Oropharynx as per pre-operative assessment  Difficulty Due To: Difficulty was anticipated and Difficult Airway- due to dentition

## 2021-08-31 NOTE — Assessment & Plan Note (Signed)
-  Patient with prior report of afib x 1, not on AC -EKG and telemetry both show afib today -He is rate controlled without medications -Will need to start [facility] post-operatively when ok with surgery

## 2021-08-31 NOTE — ED Triage Notes (Signed)
C/o upper abd. Pain onset 1 hours ago, c/o n/v

## 2021-08-31 NOTE — ED Provider Notes (Signed)
  Physical Exam  BP (!) 184/64 (BP Location: Left Arm)   Pulse 60   Temp 98.1 F (36.7 C) (Oral)   Resp (!) 22   Ht 6\' 2"  (1.88 m)   Wt 97.5 kg   SpO2 97%   BMI 27.60 kg/m   Physical Exam Vitals and nursing note reviewed.  Constitutional:      General: He is not in acute distress.    Appearance: He is well-developed.     Comments: nontoxic  HENT:     Head: Normocephalic and atraumatic.  Eyes:     Extraocular Movements: Extraocular movements intact.  Cardiovascular:     Rate and Rhythm: Normal rate.  Pulmonary:     Effort: Pulmonary effort is normal.  Abdominal:     General: There is no distension.     Palpations: Abdomen is soft. There is no mass.     Tenderness: There is abdominal tenderness. There is no guarding or rebound.     Comments: Diffuse mild ttp of the abd  Musculoskeletal:        General: Normal range of motion.     Cervical back: Normal range of motion.  Skin:    General: Skin is warm.     Findings: No rash.  Neurological:     Mental Status: He is alert and oriented to person, place, and time.    ED Course/Procedures     Procedures  MDM    Patient signed out to me by L. , PA-C.  Please see previous notes for further history.  In brief, patient presenting for evaluation of abdominal pain, nausea. Pt denies vomiting. Also reports no BM in 2 days, not passing gas. No previous SBO, h/o prostates surgery, no other abd surgeries.  pain began last night.  Overall is well-appearing.  Vital signs reassuring.  Labs reassuring.  Pending CT.    CT does not show any signs of small bowel obstruction.  Does show distended gallbladder with gallstone right at the neck.  As such, obtain ultrasound to rule out choledocholithiasis.  Additionally shows renal cysts, discussed findings with patient and wife.  Ultrasound shows distended gallbladder with multiple gallstones and sludge.  No surrounding fluid, but patient did have a positive Murphy sign.  On  reevaluation, patient continues to have right upper quadrant abdominal pain.  Will consult with general surgery.  Discussed with B. Meuth, PA-C from general surgery who will evaluate the patient.  General surgery evaluated the patient.  Recommends medicine admit and they will consult.  Discussed with Dr. Allyne Gee from [facility] hospital[facility], who will discuss with gen surgery regarding primary admitting team.     Ophelia Charter, PA-C 08/31/21 1016    09/02/21, MD 09/04/21 [phone]

## 2021-08-31 NOTE — Anesthesia Preprocedure Evaluation (Addendum)
Anesthesia Evaluation  Patient identified by MRN, date of birth, ID band Patient awake    Reviewed: Allergy & Precautions, NPO status , Patient's Chart, lab work & pertinent test results  Airway Mallampati: II  TM Distance: >3 FB Neck ROM: Full    Dental no notable dental hx. (+) Loose, Poor Dentition,    Pulmonary neg pulmonary ROS,    Pulmonary exam normal breath sounds clear to auscultation       Cardiovascular hypertension, Pt. on medications Normal cardiovascular exam+ dysrhythmias (Known sinus bradycardia followed by cardiology; h/o paroxysmal afib)  Rhythm:Regular Rate:Normal  Marked sinus bradycardia Abnormal ECG Confirmed by Marily Memos [phone]) on 08/31/2021 5:40:00 AM   Neuro/Psych Memory loss Essential tremor (bilaterally, right worse than left) negative psych ROS   GI/Hepatic negative GI ROS,   Endo/Other  diabetes, Well Controlled, Type 2hyperlipidemia  Renal/GU Renal InsufficiencyRenal disease  negative genitourinary   Musculoskeletal negative musculoskeletal ROS (+)   Abdominal   Peds negative pediatric ROS (+)  Hematology negative hematology ROS (+)   Anesthesia Other Findings   Reproductive/Obstetrics negative OB ROS                            Anesthesia Physical Anesthesia Plan  ASA: 3 and emergent  Anesthesia Plan: General   Post-op Pain Management:    Induction: Rapid sequence and Cricoid pressure planned  PONV Risk Score and Plan: Treatment may vary due to age or medical condition  Airway Management Planned: Oral ETT and Video Laryngoscope Planned  Additional Equipment: None  Intra-op Plan:   Post-operative Plan: Extubation in OR  Informed Consent: I have reviewed the patients History and Physical, chart, labs and discussed the procedure including the risks, benefits and alternatives for the proposed anesthesia with the patient or authorized  representative who has indicated his/her understanding and acceptance.       Plan Discussed with: Anesthesiologist and CRNA  Anesthesia Plan Comments: (Teeth very loose on the bottom right. Discussed the possiblity of them becoming dislodged perioperatively but specifically with intubation which could result in aspirating or swallowing a tooth.  He expressed understanding and wishes to proceed with surgery knowing the risks. GETA. RSI. Glidescope to avoid pressure on teeth as much as possible. Tanna Furry, MD  )       Anesthesia Quick Evaluation

## 2021-08-31 NOTE — Assessment & Plan Note (Signed)
-  Mild AKI - hold HCTZ and give gentle IVF -Uncontrolled HTN in ER, will give hydralazine x 1 now -Continue Norvasc

## 2021-08-31 NOTE — Consult Note (Signed)
[facility] Consult Note  Joseph Lamb 12/04/1950  [id number].    Requesting MD: Varney Biles Chief Complaint/Reason for Consult: gallstones  HPI:  Joseph Lamb is a 70yo male PMH HTN, HLD, DM, bradycardia, OSA, and mild vascular neurocognitive disorder who presented to [facility] today complaining of acute onset abdominal pain. States that it woke him from sleep around 0200. He complains of RUQ/epigastric pain radiating into his back. Associated symptoms include nausea, no emesis. Denies fever, chills, diarrhea. Symptoms were unrelenting so he came to the ED. States that he had one similar severe attack severe years ago, and was told it was his gallbladder. He does report some mild RUQ after eating at times, but not this severe lately. In the ED patient was found to be hypertensive and bradycardic, otherwise VSS. CT scan shows significantly distended gallbladder with a possible noncalcified gallstone at the gallbladder neck. U/s shows sludge and cholelithiasis, positive Sonographic Murphy sign, no wall thickening or pericholecystic fluid. WBC, LFTs, lipase WNL. General surgery asked to see.  He has had nothing to eat/ drink today  Abdominal surgical history: partial prostatectomy? Anticoagulants: none Nonsmoker Denies alcohol or illicit drug use Lives at home with his wife Ambulates without assistive device  Review of Systems  Constitutional: Negative.   Respiratory: Negative.    Cardiovascular: Negative.   Gastrointestinal:  Positive for abdominal pain and nausea. Negative for diarrhea and vomiting.  Musculoskeletal:  Positive for back pain.   All systems reviewed and otherwise negative except for as above  Family History  Problem Relation Age of Onset   Diabetes Mother    Diabetes Father     Past Medical History:  Diagnosis Date   Arrhythmia    Diabetes mellitus type 2, uncomplicated ([place]) [id number]   Dizzy spells 03/08/2020   Essential hypertension  06/14/2015   High cholesterol    Hyperlipidemia 06/14/2015   Hypertension    Memory loss 06/14/2015   Pre-diabetes    Sinus bradycardia 03/08/2020    Past Surgical History:  Procedure Laterality Date   EYE SURGERY     PROSTATE SURGERY      Social History:  reports that he has never smoked. He has never used smokeless tobacco. He reports that he does not currently use alcohol. He reports that he does not use drugs.  Allergies:  Allergies  Allergen Reactions   Lisinopril Cough   Telmisartan     Other reaction(s): decreased kidney functions   Crestor [Rosuvastatin Calcium]     " fatigue"    Simvastatin Other (See Comments)    "fatigue"    (Not in a hospital admission)   Prior to Admission medications   Medication Sig Start Date End Date Taking? Authorizing Provider  amLODipine (NORVASC) 5 MG tablet Take 1 tablet (5 mg total) by mouth daily. Patient taking differently: Take 5 mg by mouth at bedtime. 03/07/21 08/31/21 Yes Revankar, Reita Cliche, MD  aspirin EC 325 MG tablet Take 325 mg by mouth every 4 (four) hours as needed for mild pain (headache).   Yes [provider]  donepezil (ARICEPT) 5 MG tablet Take 5 mg by mouth at bedtime. 07/19/21  Yes [provider]  hydrochlorothiazide (HYDRODIURIL) 25 MG tablet Take 25 mg by mouth at bedtime. 07/23/21  Yes [provider]  latanoprost (XALATAN) 0.005 % ophthalmic solution Place 1 drop into both eyes at bedtime. 02/18/20  Yes [provider]  Multiple Vitamins-Minerals (MULTIVITAMIN MEN 50+) TABS Take 1 tablet by mouth daily.  Yes [provider]  pravastatin (PRAVACHOL) 20 MG tablet Take 20 mg by mouth at bedtime. 07/19/21  Yes [provider]  tadalafil (CIALIS) 20 MG tablet Take 20 mg by mouth daily as needed for erectile dysfunction. 02/01/20  Yes [provider]  TRULICITY 1.5 MG/0.5ML SOPN Inject 1.5 mg into the skin once a week. 06/02/17  Yes [provider]  aspirin  EC 81 MG tablet Take 1 tablet (81 mg total) by mouth daily. Patient not taking: Reported on 08/31/2021 07/24/17   Revankar, Aundra Dubin, MD  donepezil (ARICEPT) 10 MG tablet Take 1 tablet (10 mg total) by mouth at bedtime. Patient not taking: Reported on 08/31/2021 05/09/21   Van Clines, MD  pravastatin (PRAVACHOL) 10 MG tablet Take 1 tablet (10 mg total) by mouth daily. Patient not taking: Reported on 08/31/2021 03/07/21   Revankar, Aundra Dubin, MD    Blood pressure (!) 178/86, pulse (!) 46, temperature 98.1 F (36.7 C), temperature source Oral, resp. rate 18, height 6\' 2"  (1.88 m), weight 97.5 kg, SpO2 97 %. Physical Exam: General: pleasant, elderly male who is laying in bed in NAD HEENT: head is normocephalic, atraumatic.  Sclera are noninjected.  Pupils equal and round.  Ears and nose without any masses or lesions.  Mouth is pink and moist. Dentition fair Heart: bradycardic, regular rhythm.  No obvious murmurs, gallops, or rubs noted.  Palpable pedal pulses bilaterally  Lungs: CTAB, no wheezes, rhonchi, or rales noted.  Respiratory effort nonlabored Abd: well healed midline incision distal to umbilicus, soft, ND, +BS, no masses, hernias, or organomegaly. TTP RUQ and epigastric region with some voluntary guarding, positive Murphy sign MS: no BUE/BLE edema, calves soft and nontender Skin: warm and dry with no masses, lesions, or rashes Psych: A&Ox4 with an appropriate affect Neuro: cranial nerves grossly intact, equal strength in BUE/BLE bilaterally, normal speech, thought process intact, tremor noted in RUE>LUE  Results for orders placed or performed during the hospital encounter of 08/31/21 (from the past 48 hour(s))  Lipase, blood     Status: None   Collection Time: 08/31/21  5:36 AM  Result Value Ref Range   Lipase 39 11 - 51 U/L    Comment: Performed at [facility], [address], [place], [place]  Comprehensive metabolic panel     Status: Abnormal   Collection Time:  08/31/21  5:36 AM  Result Value Ref Range   Sodium 137 135 - 145 mmol/L   Potassium 3.8 3.5 - 5.1 mmol/L   Chloride 102 98 - 111 mmol/L   CO2 27 22 - 32 mmol/L   Glucose, Bld 153 (H) 70 - 99 mg/dL    Comment: Glucose reference range applies only to samples taken after fasting for at least 8 hours.   BUN 15 8 - 23 mg/dL   Creatinine, Ser 09/02/21 (H) 0.61 - 1.24 mg/dL   Calcium 9.5 8.9 - 5.95 mg/dL   Total Protein 7.6 6.5 - 8.1 g/dL   Albumin 4.0 3.5 - 5.0 g/dL   AST 18 15 - 41 U/L   ALT 14 0 - 44 U/L   Alkaline Phosphatase 93 38 - 126 U/L   Total Bilirubin 0.9 0.3 - 1.2 mg/dL   GFR, Estimated 49 (L) >60 mL/min    Comment: (NOTE) Calculated using the CKD-EPI Creatinine Equation (2021)    Anion gap 8 5 - 15    Comment: Performed at [facility], [address], [place],  [place] [address]  CBC     Status: None   Collection Time: 08/31/21  5:36 AM  Result Value Ref Range   WBC 10.0 4.0 - 10.5 K/uL   RBC 5.55 4.22 - 5.81 MIL/uL   Hemoglobin 15.6 13.0 - 17.0 g/dL   HCT 48.1 39.0 - 52.0 %   MCV 86.7 80.0 - 100.0 fL   MCH 28.1 26.0 - 34.0 pg   MCHC 32.4 30.0 - 36.0 g/dL   RDW 14.6 11.5 - 15.5 %   Platelets 235 150 - 400 K/uL   nRBC 0.0 0.0 - 0.2 %    Comment: Performed at [facility], [place] [address] [place], [place] [address]  Urinalysis, Routine w reflex microscopic Urine, Clean Catch     Status: None   Collection Time: 08/31/21  5:36 AM  Result Value Ref Range   Color, Urine YELLOW YELLOW   APPearance CLEAR CLEAR   Specific Gravity, Urine 1.013 1.005 - 1.030   pH 5.0 5.0 - 8.0   Glucose, UA NEGATIVE NEGATIVE mg/dL   Hgb urine dipstick NEGATIVE NEGATIVE   Bilirubin Urine NEGATIVE NEGATIVE   Ketones, ur NEGATIVE NEGATIVE mg/dL   Protein, ur NEGATIVE NEGATIVE mg/dL   Nitrite NEGATIVE NEGATIVE   Leukocytes,Ua NEGATIVE NEGATIVE    Comment: Performed at [facility] [address] [place], [place] [address]   CT ABDOMEN PELVIS W CONTRAST  Result Date:  08/31/2021 CLINICAL DATA:  Epigastric pain EXAM: CT ABDOMEN AND PELVIS WITH CONTRAST TECHNIQUE: Multidetector CT imaging of the abdomen and pelvis was performed using the standard protocol following bolus administration of intravenous contrast. CONTRAST:  63mL OMNIPAQUE IOHEXOL 300 MG/ML  SOLN COMPARISON:  Abdominal ultrasound 04/06/2016, CT abdomen/pelvis 09/16/2008 FINDINGS: Lower chest: The lung bases are clear. The imaged heart is unremarkable. Hepatobiliary: The liver is unremarkable. The gallbladder is significantly distended. There is a possible noncalcified gallstone in the gallbladder neck measuring up to proximally 1 cm. There is no gallbladder wall thickening or pericholecystic fluid. There is no biliary ductal dilatation. Pancreas: Unremarkable. Spleen: Unremarkable. Adrenals/Urinary Tract: The adrenals are unremarkable. Multiple bilateral renal cysts are noted, the largest measuring up to 5.3 cm. There are no suspicious lesions. There are no stones. There is no hydronephrosis or hydroureter. The bladder is unremarkable. Stomach/Bowel: The stomach is unremarkable. There is no evidence of bowel obstruction. There is no abnormal bowel wall thickening or inflammatory change. There are scattered colonic diverticuli without evidence of acute diverticulitis. There is no evidence of acute appendicitis. Vascular/Lymphatic: There is scattered calcified atherosclerotic plaque in the nonaneurysmal abdominal aorta. The major branch vessels are patent. Main portal and splenic veins are patent. There is no abdominal or pelvic lymphadenopathy. Reproductive: There are probable postprocedural changes reflecting prior TURP. The prostate and seminal vesicles are otherwise unremarkable. Other: There is no ascites or free air. There is a fat containing umbilical hernia, unchanged since 2009. Musculoskeletal: There is no acute osseous abnormality or aggressive osseous lesion. There is grade 1 anterolisthesis of L4 on L5.  IMPRESSION: 1. Significantly distended gallbladder with a possible noncalcified gallstone at the gallbladder neck. No evidence of acute cholecystitis. Right upper quadrant ultrasound may be considered for further evaluation as indicated. 2. Otherwise, no acute findings in the abdomen or pelvis. Electronically Signed   By: Valetta Mole M.D.   On: 08/31/2021 07:40   US Abdomen Limited RUQ (LIVER/GB)  Result Date: 08/31/2021 CLINICAL DATA:  Abnormal CT EXAM: ULTRASOUND ABDOMEN LIMITED RIGHT UPPER QUADRANT COMPARISON:  None. FINDINGS: Gallbladder: Multiple gallstones are present. Largest measures 2.2 cm. Sludge is also present. There is no wall thickening visualized. Sonographic Murphy sign noted by sonographer. No pericholecystic fluid. Common bile duct: Diameter: 4 mm. Evaluation is limited due to shadowing from gallbladder stones. Liver: No focal lesion identified. Increased parenchymal echogenicity. Portal vein is patent on color Doppler imaging with normal direction of blood flow towards the liver. Other: None. IMPRESSION: Sludge and cholelithiasis. Sonographic Percell Miller sign was noted. However, there is no wall thickening or pericholecystic fluid; therefore remains indeterminate for acute cholecystitis. Electronically Signed   By: Macy Mis M.D.   On: 08/31/2021 08:43      Assessment/Plan Symptomatic cholelithiasis, possible early acute cholecystitis - Patient with clinical and radiographic findings consistent with symptomatic cholelithiasis. Symptoms persist in the ED despite pain medication. Will plan for admission and laparoscopic cholecystectomy today. Keep NPO and start IV rocephin. Given multiple comorbidities will ask medicine to see.  ID - rocephin VTE - ok for chemical dvt prophylaxis postop FEN - NPO Foley - none  HTN HLD DM Bradycardia OSA Mild vascular neurocognitive disorder Tremor  Wellington Hampshire, [facility] 08/31/2021, 10:11 AM Please see Amion for  pager number during day hours 7:00am-4:30pm

## 2021-08-31 NOTE — Consult Note (Signed)
Consult Note   Joseph Lamb NBV:[id number] DOB: 11-Mar-1951 DOA: 08/31/2021  PCP: Lupita Raider, MD Consultants:  Karel Jarvis - neurology; Revankar - cardiology Patient coming from:  Home - lives with wife and son; Jackey Loge: Wife, [phone]  Chief Complaint: abdominal pain  HPI: Joseph Lamb is a 70 y.o. male with medical history significant of DM; HTN; HLD; and dementia presenting with abdominal pain. He woke up in the middle of the night with RUQ/epigastric pain.  He was perfectly normal yesterday.  He was told 10 or so years ago that he might need his gallbladder removed.  No fevers.  +nausea, no vomiting.  Last BM was maybe 2 days ago.  He has an essential tremor at baseline.  He has had afib at least once in the past but is not on [facility].    ED Course: Sx cholelithiasis.  Surgery saw, will consult.  Review of Systems: As per HPI; otherwise review of systems reviewed and negative.   Ambulatory Status:  Ambulates without assistance  COVID Vaccine Status:   Complete plus booster  Past Medical History:  Diagnosis Date   Atrial fibrillation (HCC)    Diabetes mellitus type 2, uncomplicated (HCC) 06/14/2015   Dizzy spells 03/08/2020   Essential hypertension 06/14/2015   Hyperlipidemia 06/14/2015   Memory loss 06/14/2015   Sinus bradycardia 03/08/2020    Past Surgical History:  Procedure Laterality Date   EYE SURGERY     PROSTATE SURGERY      Social History   Socioeconomic History   Marital status: Married    Spouse name: Not on file   Number of children: 2   Years of education: 15   Highest education level: Not on file  Occupational History   Occupation: Retired  Tobacco Use   Smoking status: Never   Smokeless tobacco: Never  Vaping Use   Vaping Use: Never used  Substance and Sexual Activity   Alcohol use: Not Currently    Alcohol/week: 0.0 standard drinks    Comment: rare   Drug use: No   Sexual activity: Not on file  Other Topics Concern   Not on file   Social History Narrative   Right handed   One story home   Drinks no caffeine, occ soda.   Lives with wife and son   Social Determinants of Health   Financial Resource Strain: Not on file  Food Insecurity: Not on file  Transportation Needs: Not on file  Physical Activity: Not on file  Stress: Not on file  Social Connections: Not on file  Intimate Partner Violence: Not on file    Allergies  Allergen Reactions   Lisinopril Cough   Telmisartan     Other reaction(s): decreased kidney functions   Crestor [Rosuvastatin Calcium]     " fatigue"    Simvastatin Other (See Comments)    "fatigue"    Family History  Problem Relation Age of Onset   Diabetes Mother    Diabetes Father     Prior to Admission medications   Medication Sig Start Date End Date Taking? Authorizing Provider  amLODipine (NORVASC) 5 MG tablet Take 1 tablet (5 mg total) by mouth daily. Patient taking differently: Take 5 mg by mouth at bedtime. 03/07/21 08/31/21 Yes Revankar, Aundra Dubin, MD  aspirin EC 325 MG tablet Take 325 mg by mouth every 4 (four) hours as needed for mild pain (headache).   Yes [provider]  donepezil (ARICEPT) 5 MG tablet Take 5 mg by mouth at  bedtime. 07/19/21  Yes [provider]  hydrochlorothiazide (HYDRODIURIL) 25 MG tablet Take 25 mg by mouth at bedtime. 07/23/21  Yes [provider]  latanoprost (XALATAN) 0.005 % ophthalmic solution Place 1 drop into both eyes at bedtime. 02/18/20  Yes [provider]  Multiple Vitamins-Minerals (MULTIVITAMIN MEN 50+) TABS Take 1 tablet by mouth daily.   Yes [provider]  pravastatin (PRAVACHOL) 20 MG tablet Take 20 mg by mouth at bedtime. 07/19/21  Yes [provider]  tadalafil (CIALIS) 20 MG tablet Take 20 mg by mouth daily as needed for erectile dysfunction. 02/01/20  Yes [provider]  TRULICITY 1.5 0000000 SOPN Inject 1.5 mg into the skin once a week. 06/02/17  Yes [provider]  aspirin EC 81 MG tablet Take 1 tablet (81 mg total) by mouth daily. Patient not taking: Reported on 08/31/2021 07/24/17   Revankar, Reita Cliche, MD  donepezil (ARICEPT) 10 MG tablet Take 1 tablet (10 mg total) by mouth at bedtime. Patient not taking: Reported on 08/31/2021 05/09/21   Cameron Sprang, MD  pravastatin (PRAVACHOL) 10 MG tablet Take 1 tablet (10 mg total) by mouth daily. Patient not taking: Reported on 08/31/2021 03/07/21   RevankarReita Cliche, MD    Physical Exam: Vitals:   08/31/21 0832 08/31/21 0845 08/31/21 0900 08/31/21 1012  BP: (!) 178/63 (!) 167/76 (!) 178/86 (!) 160/71  Pulse: (!) 58 61 (!) 46 (!) 58  Resp: 19 18 18 18   Temp:      TempSrc:      SpO2: 95% 96% 97% 93%  Weight:      Height:         General:  Appears calm and comfortable and is in NAD; essential tremor noted Eyes:  PERRL, EOMI, normal lids, iris ENT:  grossly normal hearing, lips & tongue, mmm; poor dentition Neck:  no LAD, masses or thyromegaly Cardiovascular:  Irregularly irregular but rate controlled, no m/r/g. No LE edema.  Respiratory:   CTA bilaterally with no wheezes/rales/rhonchi.  Normal respiratory effort. Abdomen:  soft, mid-epigastric/RUQ TTP, ND, hypoactive BS Skin:  no rash or induration seen on limited exam Musculoskeletal:  grossly normal tone BUE/BLE, good ROM, no bony abnormality Psychiatric:  blunted mood and affect, speech fluent and appropriate, AOx3 Neurologic:  CN 2-12 grossly intact, moves all extremities in coordinated fashion, +essential tremor    Radiological Exams on Admission: Independently reviewed - see discussion in A/P where applicable  CT ABDOMEN PELVIS W CONTRAST  Result Date: 08/31/2021 CLINICAL DATA:  Epigastric pain EXAM: CT ABDOMEN AND PELVIS WITH CONTRAST TECHNIQUE: Multidetector CT imaging of the abdomen and pelvis was performed using the standard protocol following bolus administration of intravenous contrast. CONTRAST:  78mL OMNIPAQUE  IOHEXOL 300 MG/ML  SOLN COMPARISON:  Abdominal ultrasound 04/06/2016, CT abdomen/pelvis 09/16/2008 FINDINGS: Lower chest: The lung bases are clear. The imaged heart is unremarkable. Hepatobiliary: The liver is unremarkable. The gallbladder is significantly distended. There is a possible noncalcified gallstone in the gallbladder neck measuring up to proximally 1 cm. There is no gallbladder wall thickening or pericholecystic fluid. There is no biliary ductal dilatation. Pancreas: Unremarkable. Spleen: Unremarkable. Adrenals/Urinary Tract: The adrenals are unremarkable. Multiple bilateral renal cysts are noted, the largest measuring up to 5.3 cm. There are no suspicious lesions. There are no stones. There is no hydronephrosis or hydroureter. The bladder is unremarkable. Stomach/Bowel: The stomach is unremarkable. There is no evidence of bowel obstruction. There is no abnormal bowel wall thickening or inflammatory  change. There are scattered colonic diverticuli without evidence of acute diverticulitis. There is no evidence of acute appendicitis. Vascular/Lymphatic: There is scattered calcified atherosclerotic plaque in the nonaneurysmal abdominal aorta. The major branch vessels are patent. Main portal and splenic veins are patent. There is no abdominal or pelvic lymphadenopathy. Reproductive: There are probable postprocedural changes reflecting prior TURP. The prostate and seminal vesicles are otherwise unremarkable. Other: There is no ascites or free air. There is a fat containing umbilical hernia, unchanged since 2009. Musculoskeletal: There is no acute osseous abnormality or aggressive osseous lesion. There is grade 1 anterolisthesis of L4 on L5. IMPRESSION: 1. Significantly distended gallbladder with a possible noncalcified gallstone at the gallbladder neck. No evidence of acute cholecystitis. Right upper quadrant ultrasound may be considered for further evaluation as indicated. 2. Otherwise, no acute findings in  the abdomen or pelvis. Electronically Signed   By: Lesia HausenPeter  Noone M.D.   On: 08/31/2021 07:40   US Abdomen Limited RUQ (LIVER/GB)  Result Date: 08/31/2021 CLINICAL DATA:  Abnormal CT EXAM: ULTRASOUND ABDOMEN LIMITED RIGHT UPPER QUADRANT COMPARISON:  None. FINDINGS: Gallbladder: Multiple gallstones are present. Largest measures 2.2 cm. Sludge is also present. There is no wall thickening visualized. Sonographic Murphy sign noted by sonographer. No pericholecystic fluid. Common bile duct: Diameter: 4 mm. Evaluation is limited due to shadowing from gallbladder stones. Liver: No focal lesion identified. Increased parenchymal echogenicity. Portal vein is patent on color Doppler imaging with normal direction of blood flow towards the liver. Other: None. IMPRESSION: Sludge and cholelithiasis. Sonographic Eulah PontMurphy sign was noted. However, there is no wall thickening or pericholecystic fluid; therefore remains indeterminate for acute cholecystitis. Electronically Signed   By: Guadlupe SpanishPraneil  Patel M.D.   On: 08/31/2021 08:43    EKG: Independently reviewed.  Afib with rate 44; nonspecific ST changes with no evidence of acute ischemia   Labs on Admission: I have personally reviewed the available labs and imaging studies at the time of the admission.  Pertinent labs:   Glucose 153 BUN 15/Creatinine 1.51/GFR 49 Normal LFTs Normal CBC COVID/flu negative UA WNL   Assessment/Plan Principal Problem:   Symptomatic cholelithiasis Active Problems:   Arrhythmia   Memory loss   Essential hypertension   Hyperlipidemia   Diabetes mellitus type 2, uncomplicated (HCC)   Essential tremor   Glaucoma    * Symptomatic cholelithiasis -Patient with prior diagnosis of cholelithiasis presenting with RUQ/midepigastric pain and nausea -Normal LFTs -RUQ with cholelithiasis, CT with gallstone at gallbladder neck -No apparent acute cholecystitis -He is being taken to the OR now with surgery and they will admit and manage this  issue  Arrhythmia -Patient with prior report of afib x 1, not on AC -EKG and telemetry both show afib today -He is rate controlled without medications -[facility] ok with surgery  Glaucoma -Continue latanoprost  Essential tremor -Not on medications -f/u prn with neurology  Diabetes mellitus type 2, uncomplicated (HCC) -Will check A1c -hold Trulicity -Cover with moderate-scale SSI  Hyperlipidemia -Continue Pravachol  Essential hypertension -Mild AKI - hold HCTZ and give gentle IVF -Uncontrolled HTN in ER, will give hydralazine x 1 now -Continue Norvasc  Memory loss -Continue Aricept       Note: This patient has been tested and is negative for the novel coronavirus COVID-19. The patient has been fully vaccinated against COVID-19.   Level of care: Med-Surg DVT prophylaxis:  Lovenox or SCDs, per surgery Code Status:  Full - confirmed with patient/family Family Communication: Wife  was present throughout evaluation Disposition Plan:  The patient is from: home  Anticipated d/c is to: home without [facility] services   Anticipated d/c date will depend on clinical response to treatment, post-cholecystectomy  Patient is currently: acutely ill   Thank you for this interesting consult.  [facility] will plan to continue to follow the patient at this time.    Jonah Blue MD [facility]   How to contact the [facility] Attending or Consulting provider 7A - 7P or covering provider during after hours 7P -7A, for this patient?  Check the care team in [facility] and look for a) attending/consulting [facility] provider listed and b) the [facility] team listed Log into [email or web address] and use [place]'s universal password to access. If you do not have the password, please contact the hospital operator. Locate the [facility] provider you are looking for under [facility] and page to a number that you can be directly reached. If you still have difficulty reaching the provider,  please page the [facility] (Director on Call) for the Hospitalists listed on amion for assistance.   08/31/2021, 10:37 AM

## 2021-08-31 NOTE — Transfer of Care (Signed)
Immediate Anesthesia Transfer of Care Note  Patient: Joseph Lamb  Procedure(s) Performed: LAPAROSCOPIC CHOLECYSTECTOMY (Abdomen)  Patient Location: PACU  Anesthesia Type:General  Level of Consciousness: awake, alert  and oriented  Airway & Oxygen Therapy: Patient Spontanous Breathing and Patient connected to face mask oxygen  Post-op Assessment: Report given to RN and Post -op Vital signs reviewed and stable  Post vital signs: Reviewed and stable  Last Vitals:  Vitals Value Taken Time  BP 155/112 08/31/21 1301  Temp    Pulse 70 08/31/21 1303  Resp 22 08/31/21 1303  SpO2 100 % 08/31/21 1303  Vitals shown include unvalidated device data.  Last Pain:  Vitals:   08/31/21 0940  TempSrc:   PainSc: 4          Complications: No notable events documented.

## 2021-08-31 NOTE — ED Notes (Signed)
Patient transported to Ultrasound 

## 2021-08-31 NOTE — ED Provider Notes (Signed)
[facility] EMERGENCY DEPARTMENT Provider Note   CSN: [id number] Arrival date & time: 08/31/21  4650     History Chief Complaint  Patient presents with   Abdominal Pain    Joseph Lamb is a 70 y.o. male.  The history is provided by the patient and medical records.  Abdominal Pain  70 y.o. M with hx of DM2, HTN, HLP, memory loss, presenting to the ED for abdominal pain.  States this started about 2 hours ago with associated nausea/vomiting.  He denies any diarrhea-- actually has had some trouble with BM recently but did have small one yesterday.  Denies fever/chills.  No trouble urinating.  Denies prior abdominal surgeries.  No meds taken PTA.  Past Medical History:  Diagnosis Date   Arrhythmia    Diabetes mellitus type 2, uncomplicated (HCC) 06/14/2015   Dizzy spells 03/08/2020   Essential hypertension 06/14/2015   High cholesterol    Hyperlipidemia 06/14/2015   Hypertension    Memory loss 06/14/2015   Pre-diabetes    Sinus bradycardia 03/08/2020    Patient Active Problem List   Diagnosis Date Noted   Arrhythmia    High cholesterol    Hypertension    Pre-diabetes    Dizzy spells 03/08/2020   Sinus bradycardia 03/08/2020   Memory loss 06/14/2015   Essential hypertension 06/14/2015   Hyperlipidemia 06/14/2015   Diabetes mellitus type 2, uncomplicated (HCC) 06/14/2015    Past Surgical History:  Procedure Laterality Date   EYE SURGERY     PROSTATE SURGERY         Family History  Problem Relation Age of Onset   Diabetes Mother    Diabetes Father     Social History   Tobacco Use   Smoking status: Never   Smokeless tobacco: Never  Vaping Use   Vaping Use: Never used  Substance Use Topics   Alcohol use: Not Currently    Alcohol/week: 0.0 standard drinks    Comment: rare   Drug use: No    Home Medications Prior to Admission medications   Medication Sig Start Date End Date Taking? Authorizing Provider  amLODipine (NORVASC) 5 MG tablet  Take 1 tablet (5 mg total) by mouth daily. 03/07/21 06/05/21  Revankar, Aundra Dubin, MD  aspirin EC 81 MG tablet Take 1 tablet (81 mg total) by mouth daily. 07/24/17   Revankar, Aundra Dubin, MD  donepezil (ARICEPT) 10 MG tablet Take 1 tablet (10 mg total) by mouth at bedtime. 05/09/21   Van Clines, MD  latanoprost (XALATAN) 0.005 % ophthalmic solution Place 1 drop into both eyes at bedtime. 02/18/20   [provider]  pravastatin (PRAVACHOL) 10 MG tablet Take 1 tablet (10 mg total) by mouth daily. 03/07/21   Revankar, Aundra Dubin, MD  sertraline (ZOLOFT) 50 MG tablet Take 50 mg by mouth daily. 01/08/20   [provider]  tadalafil (CIALIS) 20 MG tablet Take 20 mg by mouth daily as needed for erectile dysfunction. 02/01/20   [provider]  TRULICITY 1.5 MG/0.5ML SOPN Inject 1.5 mg into the skin once a week. 06/02/17   [provider]    Allergies    Lisinopril, Telmisartan, Crestor [rosuvastatin calcium], and Simvastatin  Review of Systems   Review of Systems  Gastrointestinal:  Positive for abdominal pain.  All other systems reviewed and are negative.  Physical Exam Updated Vital Signs BP (!) 200/83 (BP Location: Left Arm)   Pulse (!) 47   Temp 98.1 F (36.7 C) (  Oral)   Resp 17   Ht 6\' 2"  (1.88 m)   Wt 97.5 kg   SpO2 100%   BMI 27.60 kg/m   Physical Exam Vitals and nursing note reviewed.  Constitutional:      Appearance: He is well-developed.  HENT:     Head: Normocephalic and atraumatic.  Eyes:     Conjunctiva/sclera: Conjunctivae normal.     Pupils: Pupils are equal, round, and reactive to light.  Cardiovascular:     Rate and Rhythm: Normal rate and regular rhythm.     Heart sounds: Normal heart sounds.  Pulmonary:     Effort: Pulmonary effort is normal.     Breath sounds: Normal breath sounds.  Abdominal:     General: Bowel sounds are normal.     Palpations: Abdomen is soft.     Tenderness: There is abdominal tenderness in the right upper  quadrant, epigastric area, periumbilical area and left upper quadrant.     Comments: Mildly tender all across upper abdomen and somewhat into per-umbilical region, no lower abdominal tenderness, normal bowel sounds  Musculoskeletal:        General: Normal range of motion.     Cervical back: Normal range of motion.  Skin:    General: Skin is warm and dry.  Neurological:     Mental Status: He is alert and oriented to person, place, and time.     Comments: Tremor noted, chronic    ED Results / Procedures / Treatments   Labs (all labs ordered are listed, but only abnormal results are displayed) Labs Reviewed  CBC  URINALYSIS, ROUTINE W REFLEX MICROSCOPIC  LIPASE, BLOOD  COMPREHENSIVE METABOLIC PANEL    EKG EKG Interpretation  Date/Time:  Friday August 31 2021 05:28:56 EST Ventricular Rate:  44 PR Interval:  174 QRS Duration: 68 QT Interval:  414 QTC Calculation: 353 R Axis:   71 Text Interpretation: Marked sinus bradycardia Abnormal ECG Confirmed by 11-02-2000 [phone]) on 08/31/2021 5:40:00 AM  Radiology No results found.  Procedures Procedures   Medications Ordered in ED Medications  fentaNYL (SUBLIMAZE) injection 50 mcg (50 mcg Intravenous Given 08/31/21 0600)  ondansetron (ZOFRAN) injection 4 mg (4 mg Intravenous Given 08/31/21 0602)    ED Course  I have reviewed the triage vital signs and the nursing notes.  Pertinent labs & imaging results that were available during my care of the patient were reviewed by me and considered in my medical decision making (see chart for details).    MDM Rules/Calculators/A&P                           70 year old male presenting to the ED with abdominal pain began 2 hours prior to arrival.  Has had some associated nausea and vomiting.  He is afebrile and nontoxic.  Has some very mild tenderness all throughout upper abdomen and around periumbilical region.  He is not have any peritoneal signs on exam.  Labs have been sent.   Will obtain CT scan.  He was given fentanyl and Zofran.  CBC and UA negative.  Remainder of labs pending.  Care will be signed out to oncoming provider to follow-up on remainder of results and CT scan.  Final Clinical Impression(s) / ED Diagnoses Final diagnoses:  Abdominal pain, unspecified abdominal location    Rx / DC Orders ED Discharge Orders     None        61, PA-C 08/31/21 [id number]  Mesner, Barbara Cower, MD 09/01/21 [phone]

## 2021-08-31 NOTE — Assessment & Plan Note (Signed)
-  Patient with prior diagnosis of cholelithiasis presenting with RUQ/midepigastric pain and nausea -Normal LFTs -RUQ with cholelithiasis, CT with gallstone at gallbladder neck -No apparent acute cholecystitis -He is being taken to the OR now with surgery and they will admit and manage this issue

## 2021-08-31 NOTE — Assessment & Plan Note (Signed)
-  Not on medications -f/u prn with neurology

## 2021-08-31 NOTE — Assessment & Plan Note (Signed)
Continue Aricept 

## 2021-08-31 NOTE — Assessment & Plan Note (Signed)
-  Will check A1c -hold Trulicity -Cover with moderate-scale SSI

## 2021-09-01 DIAGNOSIS — K819 Cholecystitis, unspecified: Secondary | ICD-10-CM

## 2021-09-01 DIAGNOSIS — N179 Acute kidney failure, unspecified: Secondary | ICD-10-CM | POA: Diagnosis present

## 2021-09-01 DIAGNOSIS — I480 Paroxysmal atrial fibrillation: Secondary | ICD-10-CM | POA: Diagnosis present

## 2021-09-01 DIAGNOSIS — H409 Unspecified glaucoma: Secondary | ICD-10-CM | POA: Diagnosis present

## 2021-09-01 DIAGNOSIS — K8020 Calculus of gallbladder without cholecystitis without obstruction: Secondary | ICD-10-CM | POA: Diagnosis present

## 2021-09-01 DIAGNOSIS — E119 Type 2 diabetes mellitus without complications: Secondary | ICD-10-CM | POA: Diagnosis present

## 2021-09-01 DIAGNOSIS — Z79899 Other long term (current) drug therapy: Secondary | ICD-10-CM | POA: Diagnosis not present

## 2021-09-01 DIAGNOSIS — E7800 Pure hypercholesterolemia, unspecified: Secondary | ICD-10-CM | POA: Diagnosis present

## 2021-09-01 DIAGNOSIS — K82A1 Gangrene of gallbladder in cholecystitis: Secondary | ICD-10-CM | POA: Diagnosis present

## 2021-09-01 DIAGNOSIS — Z888 Allergy status to other drugs, medicaments and biological substances status: Secondary | ICD-10-CM | POA: Diagnosis not present

## 2021-09-01 DIAGNOSIS — K8000 Calculus of gallbladder with acute cholecystitis without obstruction: Secondary | ICD-10-CM | POA: Diagnosis present

## 2021-09-01 DIAGNOSIS — Z20822 Contact with and (suspected) exposure to covid-19: Secondary | ICD-10-CM | POA: Diagnosis present

## 2021-09-01 DIAGNOSIS — I10 Essential (primary) hypertension: Secondary | ICD-10-CM | POA: Diagnosis present

## 2021-09-01 DIAGNOSIS — Z7982 Long term (current) use of aspirin: Secondary | ICD-10-CM | POA: Diagnosis not present

## 2021-09-01 DIAGNOSIS — G4733 Obstructive sleep apnea (adult) (pediatric): Secondary | ICD-10-CM | POA: Diagnosis present

## 2021-09-01 HISTORY — DX: Cholecystitis, unspecified: K81.9

## 2021-09-01 LAB — GLUCOSE, CAPILLARY
Glucose-Capillary: 144 mg/dL — ABNORMAL HIGH (ref 70–99)
Glucose-Capillary: 186 mg/dL — ABNORMAL HIGH (ref 70–99)
Glucose-Capillary: 172 mg/dL — ABNORMAL HIGH (ref 70–99)
Glucose-Capillary: 165 mg/dL — ABNORMAL HIGH (ref 70–99)

## 2021-09-01 LAB — CBC
HCT: 42.7 % (ref 39.0–52.0)
Hemoglobin: 14.2 g/dL (ref 13.0–17.0)
MCH: 27.9 pg (ref 26.0–34.0)
MCHC: 33.3 g/dL (ref 30.0–36.0)
MCV: 83.9 fL (ref 80.0–100.0)
Platelets: 197 10*3/uL (ref 150–400)
RBC: 5.09 MIL/uL (ref 4.22–5.81)
RDW: 14.8 % (ref 11.5–15.5)
WBC: 11.9 10*3/uL — ABNORMAL HIGH (ref 4.0–10.5)
nRBC: 0 % (ref 0.0–0.2)

## 2021-09-01 LAB — BASIC METABOLIC PANEL
Anion gap: 8 (ref 5–15)
BUN: 12 mg/dL (ref 8–23)
CO2: 26 mmol/L (ref 22–32)
Calcium: 8.7 mg/dL — ABNORMAL LOW (ref 8.9–10.3)
Chloride: 103 mmol/L (ref 98–111)
Creatinine, Ser: 1.4 mg/dL — ABNORMAL HIGH (ref 0.61–1.24)
GFR, Estimated: 53 mL/min — ABNORMAL LOW (ref >60)
Glucose, Bld: 135 mg/dL — ABNORMAL HIGH (ref 70–99)
Potassium: 3.9 mmol/L (ref 3.5–5.1)
Sodium: 137 mmol/L (ref 135–145)

## 2021-09-01 LAB — HEMOGLOBIN A1C
Hgb A1c MFr Bld: 6.4 % — ABNORMAL HIGH (ref 4.8–5.6)
Mean Plasma Glucose: 137 mg/dL

## 2021-09-01 MED ORDER — ACETAMINOPHEN 500 MG PO TABS
1000.0000 mg | ORAL_TABLET | Freq: Three times a day (TID) | ORAL | Status: DC
  Administered 2021-09-01 – 2021-09-03 (×7): 1000 mg via ORAL
  Filled 2021-09-01 (×7): qty 2

## 2021-09-01 MED ORDER — PANTOPRAZOLE SODIUM 40 MG PO TBEC
40.0000 mg | DELAYED_RELEASE_TABLET | Freq: Every day | ORAL | Status: DC
  Administered 2021-09-02 – 2021-09-03 (×2): 40 mg via ORAL
  Filled 2021-09-01 (×2): qty 1

## 2021-09-01 NOTE — Progress Notes (Signed)
PROGRESS NOTE   Joseph Lamb  [id number] DOB: 12-28-1950 DOA: 08/31/2021 PCP: Mayra Neer, MD  Brief Narrative:  70 year old black male bph status post prostatectomy biopsies hypertension diabetes mellitus type 2 hld?  Bilateral hand tremors-?  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 Atilano Ina a. Fib Awoke a.m. 11/25 right upper quadrant pain with nausea no vomiting General surgery consulted right upper quadrant ultrasound showed cholelithiasis with ct showing gallstone at gallbladder neck underwent lap chole and found gangrenous cholecystitis   Hospital-Problem based course  Gangrenous cholecystitis status post lap chole  Defer to general surgery plan--continue  NS 75 cc/H, Pain control Tylenol for discharge scheduled 3 times daily, oxycodone 5-10 every 4 as needed and morphine if severe Advance diet as per surgery Paroxysmal A. Fib Patient not on monitors--- do not see prior documentation of A. fib in chart--it appears that 07/2017 office visit with cardiology who was referred to cardiology for bradycardia--we will reassess Prostatectomy for BPH Monitor only Mild vascular neurocognitive disorder Continue Aricept 5 continue, aspirin 325 only daily He will need outpatient follow-up with neurology HTN Continue HCTZ 25 daily amlodipine 5 daily Glaucoma Resume latanoprost 1 drop twice daily  DVT prophylaxis: Lovenox Code Status: Full Family Communication: None present at the bedside Disposition:  Status is: Observation  The patient will require care spanning > 2 midnights and should be moved to inpatient because: ?      Consultants:  We are consulting  Procedures:   Antimicrobials:     Subjective: Awake coherent no distress some discomfort 5/10 in abdomen ROM intact no focal deficit  Objective: Vitals:   08/31/21 1745 08/31/21 2001 08/31/21 2359 09/01/21 0408  BP: (!) 148/63 121/62 (!) 122/57 122/61  Pulse: 64 (!) 52  78 78  Resp: 17 18  18   Temp: 98.9 F (37.2 C) 98.9 F (37.2 C) 98.4 F (36.9 C) 98.3 F (36.8 C)  TempSrc: Oral Oral Oral Oral  SpO2: 97% 96% 95% 92%  Weight:      Height:        Intake/Output Summary (Last 24 hours) at 09/01/2021 0748 Last data filed at 09/01/2021 0556 Gross per 24 hour  Intake 1911.95 ml  Output 1305 ml  Net 606.95 ml   Filed Weights   08/31/21 0533 08/31/21 1049  Weight: 97.5 kg 97.5 kg    Examination:  EOMI NCAT chest clear no rales rhonchi S1-S2 no murmur Abdomen is soft slightly distended he has post laparoscopy scars with drain in the right lower quadrant He has no lower extremity edema He has some tremulousness and possible slight intention tremor Neurologically intact no focal deficit   Data Reviewed: personally reviewed   CBC    Component Value Date/Time   WBC 11.9 (H) 09/01/2021 0314   RBC 5.09 09/01/2021 0314   HGB 14.2 09/01/2021 0314   HCT 42.7 09/01/2021 0314   PLT 197 09/01/2021 0314   MCV 83.9 09/01/2021 0314   MCH 27.9 09/01/2021 0314   MCHC 33.3 09/01/2021 0314   RDW 14.8 09/01/2021 0314   LYMPHSABS 1.6 04/21/2021 0628   MONOABS 0.7 04/21/2021 0628   EOSABS 0.1 04/21/2021 0628   BASOSABS 0.1 04/21/2021 0628   CMP Latest Ref Rng & Units 09/01/2021 08/31/2021 04/21/2021  Glucose 70 - 99 mg/dL 135(H) 153(H) 113(H)  BUN 8 - 23 mg/dL 12 15 12   Creatinine 0.61 - 1.24 mg/dL 1.40(H) 1.51(H) 1.20  Sodium 135 - 145 mmol/L 137 137 137  Potassium 3.5 - 5.1 mmol/L 3.9  3.8 3.7  Chloride 98 - 111 mmol/L 103 102 105  CO2 22 - 32 mmol/L 26 27 24   Calcium 8.9 - 10.3 mg/dL ) 9.5 9.5  Total Protein 6.5 - 8.1 g/dL - 7.6 -  Total Bilirubin 0.3 - 1.2 mg/dL - 0.9 -  Alkaline Phos 38 - 126 U/L - 93 -  AST 15 - 41 U/L - 18 -  ALT 0 - 44 U/L - 14 -     Radiology Studies: CT ABDOMEN PELVIS W CONTRAST  Result Date: 08/31/2021 CLINICAL DATA:  Epigastric pain EXAM: CT ABDOMEN AND PELVIS WITH CONTRAST TECHNIQUE: Multidetector CT  imaging of the abdomen and pelvis was performed using the standard protocol following bolus administration of intravenous contrast. CONTRAST:  82mL OMNIPAQUE IOHEXOL 300 MG/ML  SOLN COMPARISON:  Abdominal ultrasound 04/06/2016, CT abdomen/pelvis 09/16/2008 FINDINGS: Lower chest: The lung bases are clear. The imaged heart is unremarkable. Hepatobiliary: The liver is unremarkable. The gallbladder is significantly distended. There is a possible noncalcified gallstone in the gallbladder neck measuring up to proximally 1 cm. There is no gallbladder wall thickening or pericholecystic fluid. There is no biliary ductal dilatation. Pancreas: Unremarkable. Spleen: Unremarkable. Adrenals/Urinary Tract: The adrenals are unremarkable. Multiple bilateral renal cysts are noted, the largest measuring up to 5.3 cm. There are no suspicious lesions. There are no stones. There is no hydronephrosis or hydroureter. The bladder is unremarkable. Stomach/Bowel: The stomach is unremarkable. There is no evidence of bowel obstruction. There is no abnormal bowel wall thickening or inflammatory change. There are scattered colonic diverticuli without evidence of acute diverticulitis. There is no evidence of acute appendicitis. Vascular/Lymphatic: There is scattered calcified atherosclerotic plaque in the nonaneurysmal abdominal aorta. The major branch vessels are patent. Main portal and splenic veins are patent. There is no abdominal or pelvic lymphadenopathy. Reproductive: There are probable postprocedural changes reflecting prior TURP. The prostate and seminal vesicles are otherwise unremarkable. Other: There is no ascites or free air. There is a fat containing umbilical hernia, unchanged since 2009. Musculoskeletal: There is no acute osseous abnormality or aggressive osseous lesion. There is grade 1 anterolisthesis of L4 on L5. IMPRESSION: 1. Significantly distended gallbladder with a possible noncalcified gallstone at the gallbladder neck. No  evidence of acute cholecystitis. Right upper quadrant ultrasound may be considered for further evaluation as indicated. 2. Otherwise, no acute findings in the abdomen or pelvis. Electronically Signed   By: 2010 M.D.   On: 08/31/2021 07:40   09/02/2021 Abdomen Limited RUQ (LIVER/GB)  Result Date: 08/31/2021 CLINICAL DATA:  Abnormal CT EXAM: ULTRASOUND ABDOMEN LIMITED RIGHT UPPER QUADRANT COMPARISON:  None. FINDINGS: Gallbladder: Multiple gallstones are present. Largest measures 2.2 cm. Sludge is also present. There is no wall thickening visualized. Sonographic Murphy sign noted by sonographer. No pericholecystic fluid. Common bile duct: Diameter: 4 mm. Evaluation is limited due to shadowing from gallbladder stones. Liver: No focal lesion identified. Increased parenchymal echogenicity. Portal vein is patent on color Doppler imaging with normal direction of blood flow towards the liver. Other: None. IMPRESSION: Sludge and cholelithiasis. Sonographic 09/02/2021 sign was noted. However, there is no wall thickening or pericholecystic fluid; therefore remains indeterminate for acute cholecystitis. Electronically Signed   By: Eulah Pont M.D.   On: 08/31/2021 08:43     Scheduled Meds:  amLODipine  5 mg Oral QHS   docusate sodium  100 mg Oral BID   donepezil  5 mg Oral QHS   enoxaparin (LOVENOX) injection  40 mg Subcutaneous Q24H   insulin aspart  0-15 Units Subcutaneous TID WC   insulin aspart  0-5 Units Subcutaneous QHS   latanoprost  1 drop Both Eyes QHS   pantoprazole (PROTONIX) IV  40 mg Intravenous QHS   pravastatin  20 mg Oral QHS   Continuous Infusions:  sodium chloride 75 mL/hr at 08/31/21 1815     LOS: 0 days   Time spent: I NCAT no focal deficit no rales no rhonchi No wheeze Abdomen soft he has a drain in place in the right lower quadrant   Nita Sells, MD [facility] To contact the attending provider between 7A-7P or the covering provider during after hours 7P-7A,  please log into the web site [email or web address] and access using universal [place] password for that web site. If you do not have the password, please call the hospital operator.  09/01/2021, 7:48 AM

## 2021-09-01 NOTE — Plan of Care (Signed)
  Problem: Clinical Measurements: Goal: Postoperative complications will be avoided or minimized Outcome: Progressing   

## 2021-09-01 NOTE — Evaluation (Signed)
Physical Therapy Evaluation Patient Details Name: Joseph Lamb MRN: [id number] DOB: [birth year] Today's Date: 09/01/2021  History of Present Illness  70 yo male s/p lap chole on 11/25. PMH includes HTN, HLD, DM, bradycardia, OSA, and mild vascular neurocognitive disorder.  Clinical Impression   Pt presents with abdominal pain, min difficulty mobilizing post-operatively, min impaired balance, and decreased activity tolerance vs baseline. Pt to benefit from acute PT to address deficits. Pt ambulated hallway distance with use of at least single UE support, periods of unsteadiness but all pt-corrected. PT anticipates pt will continue to progress well, anticipate no follow up PT needs.  PT to progress mobility as tolerated, and will continue to follow acutely.         Recommendations for follow up therapy are one component of a multi-disciplinary discharge planning process, led by the attending physician.  Recommendations may be updated based on patient status, additional functional criteria and insurance authorization.  Follow Up Recommendations No PT follow up    Assistance Recommended at Discharge Intermittent Supervision/Assistance  Functional Status Assessment Patient has had a recent decline in their functional status and demonstrates the ability to make significant improvements in function in a reasonable and predictable amount of time.  Equipment Recommendations  Rolling walker (2 wheels)    Recommendations for Other Services       Precautions / Restrictions Precautions Precautions: Fall Precaution Comments: R JP drain Restrictions Weight Bearing Restrictions: No      Mobility  Bed Mobility Overal bed mobility: Needs Assistance Bed Mobility: Rolling;Sidelying to Sit Rolling: Min guard Sidelying to sit: Min assist       General bed mobility comments: assist for trunk elevation off of bed, cues for log roll technique for abdominal comfort.    Transfers Overall  transfer level: Needs assistance Equipment used: Rolling walker (2 wheels) Transfers: Sit to/from Stand Sit to Stand: Min guard;From elevated surface           General transfer comment: close guard for safety, verbal cuing for hand placement when rising.    Ambulation/Gait Ambulation/Gait assistance: Min guard Gait Distance (Feet): 75 Feet Assistive device: Rolling walker (2 wheels);IV Pole Gait Pattern/deviations: Step-through pattern;Decreased stride length;Trunk flexed Gait velocity: decr     General Gait Details: close guard for safety, initially requiring RW transitioning to SL support on IV pole. x1 period of posterior leaning, pt reached for environment to steady self.  Stairs            Wheelchair Mobility    Modified Rankin (Stroke Patients Only)       Balance Overall balance assessment: Mild deficits observed, not formally tested                                           Pertinent Vitals/Pain Pain Assessment: 0-10 Pain Score: 4  Pain Location: abdomen Pain Descriptors / Indicators: Sore;Discomfort Pain Intervention(s): Limited activity within patient's tolerance;Monitored during session;Patient requesting pain meds-RN notified    Home Living Family/patient expects to be discharged to:: Private residence Living Arrangements: Spouse/significant other Available Help at Discharge: Family Type of Home: House Home Access: Stairs to enter   Secretary/administrator of Steps: 2   Home Layout: One level Home Equipment: None      Prior Function Prior Level of Function : Independent/Modified Independent;Driving             Mobility Comments: pt  states he is completely independent, does not use AD for mobility. ADLs Comments: independent     Hand Dominance   Dominant Hand: Right    Extremity/Trunk Assessment   Upper Extremity Assessment Upper Extremity Assessment: Defer to OT evaluation    Lower Extremity  Assessment Lower Extremity Assessment: Overall WFL for tasks assessed    Cervical / Trunk Assessment Cervical / Trunk Assessment: Other exceptions Cervical / Trunk Exceptions: abdominal surgery  Communication   Communication: No difficulties  Cognition Arousal/Alertness: Awake/alert Behavior During Therapy: WFL for tasks assessed/performed Overall Cognitive Status: History of cognitive impairments - at baseline                                 General Comments: history of mild neurocognitive disorder, pleasant and follows commands well        General Comments      Exercises     Assessment/Plan    PT Assessment Patient needs continued PT services  PT Problem List Decreased mobility;Decreased activity tolerance;Decreased balance;Decreased knowledge of use of DME;Pain;Decreased knowledge of precautions;Decreased safety awareness       PT Treatment Interventions DME instruction;Therapeutic activities;Gait training;Therapeutic exercise;Patient/family education;Stair training;Functional mobility training;Neuromuscular re-education;Balance training    PT Goals (Current goals can be found in the Care Plan section)  Acute Rehab PT Goals Patient Stated Goal: home PT Goal Formulation: With patient Time For Goal Achievement: 09/15/21 Potential to Achieve Goals: Good    Frequency Min 3X/week   Barriers to discharge        Co-evaluation               AM-PAC PT "6 Clicks" Mobility  Outcome Measure Help needed turning from your back to your side while in a flat bed without using bedrails?: A Little Help needed moving from lying on your back to sitting on the side of a flat bed without using bedrails?: A Little Help needed moving to and from a bed to a chair (including a wheelchair)?: A Little Help needed standing up from a chair using your arms (e.g., wheelchair or bedside chair)?: A Little Help needed to walk in hospital room?: A Little Help needed climbing  3-5 steps with a railing? : A Little 6 Click Score: 18    End of Session   Activity Tolerance: Patient tolerated treatment well;Patient limited by pain Patient left: in chair;with chair alarm set;with call bell/phone within reach Nurse Communication: Mobility status PT Visit Diagnosis: Other abnormalities of gait and mobility (R26.89)    Time: [id number] PT Time Calculation (min) (ACUTE ONLY): 20 min   Charges:   PT Evaluation $PT Eval Low Complexity: 1 Low        Eryck Negron S, PT DPT Acute Rehabilitation Services Pager [phone]  Office [phone]   Susane Bey E Stroup 09/01/2021, 12:00 PM

## 2021-09-01 NOTE — Progress Notes (Signed)
Patient ID: Joseph Lamb, male   DOB: 24-Oct-1950, 70 y.o.   MRN: [id number]   Acute Care Surgery Service Progress Note:    Chief Complaint/Subjective: Sore on right side but o/w has less pain than when came in Has only had CLD Hasn't been out of bed No n/v  Objective: Vital signs in last 24 hours: Temp:  [98.2 F (36.8 C)-98.9 F (37.2 C)] 98.6 F (37 C) (11/26 0918) Pulse Rate:  [52-100] 100 (11/26 0918) Resp:  [16-19] 18 (11/26 0918) BP: (121-194)/(53-76) 137/64 (11/26 0918) SpO2:  [92 %-97 %] 92 % (11/26 0918) Weight:  [97.5 kg] 97.5 kg (11/25 1049) Last BM Date: 08/30/21  Intake/Output from previous day: 11/25 0701 - 11/26 0700 In: 1912 [P.O.:1040; I.V.:872] Out: 1305 [Urine:1225; Drains:80] Intake/Output this shift: No intake/output data recorded.  Lungs: cta, nonlabored  Cardiovascular: reg  Abd: soft, incisions ok, jp - old blood, mild expected TTP  Extremities: no edema, +SCDs; RUE tremor  Neuro: alert, nonfocal  Lab Results: CBC  Recent Labs    08/31/21 0536 09/01/21 0314  WBC 10.0 11.9*  HGB 15.6 14.2  HCT 48.1 42.7  PLT 235 197   BMET Recent Labs    08/31/21 0536 09/01/21 0314  NA 137 137  K 3.8 3.9  CL 102 103  CO2 27 26  GLUCOSE 153* 135*  BUN 15 12  CREATININE 1.51* 1.40*  CALCIUM 9.5 8.7*   LFT Hepatic Function Latest Ref Rng & Units 08/31/2021 04/06/2016 11/12/2010  Total Protein 6.5 - 8.1 g/dL 7.6 7.5 7.7  Albumin 3.5 - 5.0 g/dL 4.0 3.8 3.9  AST 15 - 41 U/L 18 17 47(H)  ALT 0 - 44 U/L 14 17 33  Alk Phosphatase 38 - 126 U/L 93 106 135(H)  Total Bilirubin 0.3 - 1.2 mg/dL 0.9 0.6 1.2   PT/INR No results for input(s): LABPROT, INR in the last 72 hours. ABG No results for input(s): PHART, HCO3 in the last 72 hours.  Invalid input(s): PCO2, PO2  Studies/Results:  Anti-infectives: Anti-infectives (From admission, onward)    Start     Dose/Rate Route Frequency Ordered Stop   08/31/21 1015  cefTRIAXone (ROCEPHIN) 2 g in  sodium chloride 0.9 % 100 mL IVPB        2 g 200 mL/hr over 30 Minutes Intravenous On call to O.R. 08/31/21 1010 08/31/21 1140       Medications: Scheduled Meds:  amLODipine  5 mg Oral QHS   docusate sodium  100 mg Oral BID   donepezil  5 mg Oral QHS   enoxaparin (LOVENOX) injection  40 mg Subcutaneous Q24H   insulin aspart  0-15 Units Subcutaneous TID WC   insulin aspart  0-5 Units Subcutaneous QHS   latanoprost  1 drop Both Eyes QHS   pantoprazole (PROTONIX) IV  40 mg Intravenous QHS   pravastatin  20 mg Oral QHS   Continuous Infusions:  sodium chloride 75 mL/hr at 08/31/21 1815   PRN Meds:.acetaminophen **OR** acetaminophen, diphenhydrAMINE **OR** diphenhydrAMINE, hydrALAZINE, morphine injection, ondansetron **OR** ondansetron (ZOFRAN) IV, oxyCODONE, polyethylene glycol, simethicone  Assessment/Plan: Patient Active Problem List   Diagnosis Date Noted   Symptomatic cholelithiasis 08/31/2021   Essential tremor 08/31/2021   Glaucoma 08/31/2021   Arrhythmia    High cholesterol    Hypertension    Pre-diabetes    Dizzy spells 03/08/2020   Sinus bradycardia 03/08/2020   Memory loss 06/14/2015   Essential hypertension 06/14/2015   Hyperlipidemia 06/14/2015   Diabetes mellitus type  2, uncomplicated ([place]) 16/07/9603   s/p Procedure(s): LAPAROSCOPIC CHOLECYSTECTOMY 08/31/2021 Dr Thermon Leyland HTN Essential tremor AKI - Cr slowly improving VTe prophylaxis -scds, lovenox  Disposition: adv diet, ambulate, drain teaching. Not sure if will be ready today; will repeat bmet in am; change tylenol to scheduled   LOS: 0 days    Leighton Ruff. Redmond Pulling, MD, FACS General, Bariatric, & Minimally Invasive Surgery [phone] [facility]

## 2021-09-02 DIAGNOSIS — K8020 Calculus of gallbladder without cholecystitis without obstruction: Secondary | ICD-10-CM | POA: Diagnosis not present

## 2021-09-02 LAB — CBC
HCT: 42.5 % (ref 39.0–52.0)
Hemoglobin: 13.7 g/dL (ref 13.0–17.0)
MCH: 27.8 pg (ref 26.0–34.0)
MCHC: 32.2 g/dL (ref 30.0–36.0)
MCV: 86.2 fL (ref 80.0–100.0)
Platelets: 189 10*3/uL (ref 150–400)
RBC: 4.93 MIL/uL (ref 4.22–5.81)
RDW: 15.1 % (ref 11.5–15.5)
WBC: 11.1 10*3/uL — ABNORMAL HIGH (ref 4.0–10.5)
nRBC: 0 % (ref 0.0–0.2)

## 2021-09-02 LAB — COMPREHENSIVE METABOLIC PANEL
ALT: 53 U/L — ABNORMAL HIGH (ref 0–44)
AST: 37 U/L (ref 15–41)
Albumin: 2.9 g/dL — ABNORMAL LOW (ref 3.5–5.0)
Alkaline Phosphatase: 75 U/L (ref 38–126)
Anion gap: 5 (ref 5–15)
BUN: 14 mg/dL (ref 8–23)
CO2: 29 mmol/L (ref 22–32)
Calcium: 8.6 mg/dL — ABNORMAL LOW (ref 8.9–10.3)
Chloride: 106 mmol/L (ref 98–111)
Creatinine, Ser: 1.47 mg/dL — ABNORMAL HIGH (ref 0.61–1.24)
GFR, Estimated: 50 mL/min — ABNORMAL LOW (ref >60)
Glucose, Bld: 186 mg/dL — ABNORMAL HIGH (ref 70–99)
Potassium: 3.8 mmol/L (ref 3.5–5.1)
Sodium: 140 mmol/L (ref 135–145)
Total Bilirubin: 0.8 mg/dL (ref 0.3–1.2)
Total Protein: 6.3 g/dL — ABNORMAL LOW (ref 6.5–8.1)

## 2021-09-02 LAB — GLUCOSE, CAPILLARY
Glucose-Capillary: 102 mg/dL — ABNORMAL HIGH (ref 70–99)
Glucose-Capillary: 160 mg/dL — ABNORMAL HIGH (ref 70–99)
Glucose-Capillary: 140 mg/dL — ABNORMAL HIGH (ref 70–99)

## 2021-09-02 NOTE — Progress Notes (Signed)
Physical Therapy Treatment Patient Details Name: Joseph Lamb MRN: [id number] DOB: 01/30/1951 Today's Date: 09/02/2021   History of Present Illness 70 yo male s/p lap chole on 11/25. PMH includes HTN, HLD, DM, bradycardia, OSA, and mild vascular neurocognitive disorder.    PT Comments    Patient received in bed, pleasant and cooperative this morning. Able to mobilize on a min guard basis with IV pole and tolerated progression of gait distance today; was a bit dizzy and BP check at EOS showed systolic pressure in the 132G, RN made aware. Left up in recliner with all needs met, chair alarm active. Progressing well.     Recommendations for follow up therapy are one component of a multi-disciplinary discharge planning process, led by the attending physician.  Recommendations may be updated based on patient status, additional functional criteria and insurance authorization.  Follow Up Recommendations  No PT follow up     Assistance Recommended at Discharge Intermittent Supervision/Assistance  Equipment Recommendations  Rolling walker (2 wheels)    Recommendations for Other Services       Precautions / Restrictions Precautions Precautions: Fall Precaution Comments: R JP drain Restrictions Weight Bearing Restrictions: No     Mobility  Bed Mobility Overal bed mobility: Needs Assistance Bed Mobility: Rolling;Sidelying to Sit Rolling: Min guard Sidelying to sit: Min guard       General bed mobility comments: did much better with bed mobility today, did need Mod cues to remind him about technique for log rolling and side to sit    Transfers Overall transfer level: Needs assistance Equipment used: None Transfers: Sit to/from Stand Sit to Stand: Min guard           General transfer comment: close guard for safety, verbal cuing for hand placement when rising.    Ambulation/Gait Ambulation/Gait assistance: Min guard Gait Distance (Feet): 140 Feet Assistive device:  IV Pole Gait Pattern/deviations: Step-through pattern;Decreased stride length;Trunk flexed;Drifts right/left Gait velocity: decr     General Gait Details: light min guard, steady and safey with UUE support on IV pole, no LOB or unsteadiness noted today   Stairs             Wheelchair Mobility    Modified Rankin (Stroke Patients Only)       Balance Overall balance assessment: Mild deficits observed, not formally tested                                          Cognition Arousal/Alertness: Awake/alert Behavior During Therapy: WFL for tasks assessed/performed Overall Cognitive Status: History of cognitive impairments - at baseline                                 General Comments: history of mild neurocognitive disorder, pleasant and follows commands well        Exercises      General Comments        Pertinent Vitals/Pain Pain Assessment: 0-10 Pain Score: 4  Pain Location: abdomen Pain Descriptors / Indicators: Sore;Discomfort Pain Intervention(s): Monitored during session;Limited activity within patient's tolerance    Home Living                          Prior Function            PT Goals (current  goals can now be found in the care plan section) Acute Rehab PT Goals Patient Stated Goal: home PT Goal Formulation: With patient Time For Goal Achievement: 09/15/21 Potential to Achieve Goals: Good Progress towards PT goals: Progressing toward goals    Frequency    Min 3X/week      PT Plan Current plan remains appropriate    Co-evaluation              AM-PAC PT "6 Clicks" Mobility   Outcome Measure  Help needed turning from your back to your side while in a flat bed without using bedrails?: A Little Help needed moving from lying on your back to sitting on the side of a flat bed without using bedrails?: A Little Help needed moving to and from a bed to a chair (including a wheelchair)?: A  Little Help needed standing up from a chair using your arms (e.g., wheelchair or bedside chair)?: A Little Help needed to walk in hospital room?: A Little Help needed climbing 3-5 steps with a railing? : A Little 6 Click Score: 18    End of Session Equipment Utilized During Treatment: Gait belt Activity Tolerance: Patient tolerated treatment well Patient left: in chair;with chair alarm set;with call bell/phone within reach Nurse Communication: Mobility status;Other (comment) (a bit hypertensive after PT, leaking IV) PT Visit Diagnosis: Other abnormalities of gait and mobility (R26.89)     Time: 8864-8472 PT Time Calculation (min) (ACUTE ONLY): 25 min  Charges:  $Gait Training: 8-22 mins $Therapeutic Activity: 8-22 mins                    Windell Norfolk, DPT, PN2   Supplemental Physical Therapist [place]    Pager [phone] Acute Rehab Office [phone]

## 2021-09-02 NOTE — Progress Notes (Signed)
PROGRESS NOTE   Joseph Lamb  OZH:[id number] DOB: 1951/07/25 DOA: 08/31/2021 PCP: Lupita Raider, MD  Brief Narrative:  70 year old black male bph status post prostatectomy biopsies hypertension diabetes mellitus type 2 hld?  Bilateral hand tremors-?  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 Susa Day a. Fib Awoke a.m. 11/25 right upper quadrant pain with nausea no vomiting General surgery consulted right upper quadrant ultrasound showed cholelithiasis with ct showing gallstone at gallbladder neck underwent lap chole and found gangrenous cholecystitis   Hospital-Problem based course  Gangrenous cholecystitis status post lap chole  Defer to general surgery plan-patient saline lock is eating fairly well,  Pain control Tylenol for discharge scheduled 3 times daily, oxycodone 5-10 every 4 as needed and morphine if severe--pain seems controlled Advance diet as per surgery Needs teaching because of his tremor for drain and not ready for discharge today Paroxysmal A. Fib Patient not on monitors--- do not see prior documentation of A. fib in chart--it appears that 07/2017 office visit with cardiology who was referred to cardiology for bradycardia- Patient has had predominant bradycardia on the monitors  discontinue telemetry and follow-up with Dr. Tomie China as an outpatient Prostatectomy for BPH Monitor only Mild vascular neurocognitive disorder Continue Aricept 5 continue, aspirin 325 only daily He will need outpatient follow-up with neurology HTN Continue HCTZ 25 daily amlodipine 5 daily Glaucoma Resume latanoprost 1 drop twice daily  DVT prophylaxis: Lovenox Code Status: Full Family Communication: None present at the bedside Disposition:  Status is: Observation  The patient will require care spanning > 2 midnights and should be moved to inpatient because: ?      Consultants:  We are consulting  Procedures:   Antimicrobials:      Subjective:  Overall seems fair-getting out of bed with 1 assist therapy note reviewed Pain is moderate 5/10  Objective: Vitals:   09/01/21 1618 09/01/21 2022 09/02/21 0311 09/02/21 0936  BP: (!) 150/64 133/60 140/75 (!) 155/66  Pulse: 76 61 71 (!) 48  Resp: 18 16 16 18   Temp: 98.2 F (36.8 C) 98.8 F (37.1 C) 98.5 F (36.9 C) 98.2 F (36.8 C)  TempSrc:    Oral  SpO2: 92% 94% 95% 92%  Weight:      Height:        Intake/Output Summary (Last 24 hours) at 09/02/2021 1102 Last data filed at 09/02/2021 0900 Gross per 24 hour  Intake 2862.13 ml  Output 2520 ml  Net 342.13 ml    Filed Weights   08/31/21 0533 08/31/21 1049  Weight: 97.5 kg 97.5 kg    Examination:  Slightly flat affect neck soft supple i EOMI NCAT chest clear no rales rhonchi S1-S2 no murmur abdomen soft distended nontender no hepatosplenomegaly He has no lower extremity edema Intention tremor noted Neurologically intact no focal deficit No lower extremity edema   Data Reviewed: personally reviewed   CBC    Component Value Date/Time   WBC 11.1 (H) 09/02/2021 0328   RBC 4.93 09/02/2021 0328   HGB 13.7 09/02/2021 0328   HCT 42.5 09/02/2021 0328   PLT 189 09/02/2021 0328   MCV 86.2 09/02/2021 0328   MCH 27.8 09/02/2021 0328   MCHC 32.2 09/02/2021 0328   RDW 15.1 09/02/2021 0328   LYMPHSABS 1.6 04/21/2021 0628   MONOABS 0.7 04/21/2021 0628   EOSABS 0.1 04/21/2021 0628   BASOSABS 0.1 04/21/2021 0628   CMP Latest Ref Rng & Units 09/02/2021 09/01/2021 08/31/2021  Glucose 70 - 99 mg/dL 09/02/2021) 629(B) 284(X)  BUN 8 - 23 mg/dL 14 12 15   Creatinine 0.61 - 1.24 mg/dL 1.47(H) 1.40(H) 1.51(H)  Sodium 135 - 145 mmol/L 140 137 137  Potassium 3.5 - 5.1 mmol/L 3.8 3.9 3.8  Chloride 98 - 111 mmol/L 106 103 102  CO2 22 - 32 mmol/L 29 26 27   Calcium 8.9 - 10.3 mg/dL 8.6(L) 8.7(L) 9.5  Total Protein 6.5 - 8.1 g/dL 6.3(L) - 7.6  Total Bilirubin 0.3 - 1.2 mg/dL 0.8 - 0.9  Alkaline Phos 38 - 126 U/L 75  - 93  AST 15 - 41 U/L 37 - 18  ALT 0 - 44 U/L 53(H) - 14     Radiology Studies: No results found.   Scheduled Meds:  acetaminophen  1,000 mg Oral Q8H   amLODipine  5 mg Oral QHS   docusate sodium  100 mg Oral BID   donepezil  5 mg Oral QHS   enoxaparin (LOVENOX) injection  40 mg Subcutaneous Q24H   insulin aspart  0-15 Units Subcutaneous TID WC   insulin aspart  0-5 Units Subcutaneous QHS   latanoprost  1 drop Both Eyes QHS   pantoprazole  40 mg Oral Daily   pravastatin  20 mg Oral QHS   Continuous Infusions:  sodium chloride 75 mL/hr at 09/01/21 1838     LOS: 1 day   Time spent: 50 I have saline lock to him as he is eating fairly well   Nita Sells, MD [facility] To contact the attending provider between 7A-7P or the covering provider during after hours 7P-7A, please log into the web site [email or web address] and access using universal [place] password for that web site. If you do not have the password, please call the hospital operator.  09/02/2021, 11:02 AM

## 2021-09-02 NOTE — Plan of Care (Signed)
  Problem: Clinical Measurements: Goal: Postoperative complications will be avoided or minimized Outcome: Adequate for Discharge   Problem: Clinical Measurements: Goal: Ability to maintain clinical measurements within normal limits will improve Outcome: Adequate for Discharge

## 2021-09-02 NOTE — Progress Notes (Addendum)
Taught patient how to take care his JP drain.Patient tried but hands were unsteady due to bilateral hands tremor.Will await patient's wife for further teaching.

## 2021-09-02 NOTE — Progress Notes (Signed)
Patient ID: Joseph Lamb, male   DOB: 15-Jul-1951, 70 y.o.   MRN: [id number]   Acute Care Surgery Service Progress Note:    Chief Complaint/Subjective: Feels like he may have a bowel movement RUQ pain better, sore around incisions  Objective: Vital signs in last 24 hours: Temp:  [98.2 F (36.8 C)-98.8 F (37.1 C)] 98.5 F (36.9 C) (11/27 0311) Pulse Rate:  [61-76] 71 (11/27 0311) Resp:  [16-18] 16 (11/27 0311) BP: (133-150)/(60-75) 140/75 (11/27 0311) SpO2:  [92 %-95 %] 95 % (11/27 0311) Last BM Date: 08/30/21  Intake/Output from previous day: 11/26 0701 - 11/27 0700 In: 2842.1 [P.O.:1180; I.V.:1662.1] Out: 3285 [Urine:3225; Drains:60] Intake/Output this shift: No intake/output data recorded.  Lungs: cta, nonlabored  Cardiovascular: reg  Abd: soft, incisions ok, jp - old blood, mild expected TTP  Extremities: no edema, +SCDs; RUE tremor  Neuro: alert, nonfocal  Lab Results: CBC  Recent Labs    09/01/21 0314 09/02/21 0328  WBC 11.9* 11.1*  HGB 14.2 13.7  HCT 42.7 42.5  PLT 197 189    BMET Recent Labs    09/01/21 0314 09/02/21 0328  NA 137 140  K 3.9 3.8  CL 103 106  CO2 26 29  GLUCOSE 135* 186*  BUN 12 14  CREATININE 1.40* 1.47*  CALCIUM 8.7* 8.6*    LFT Hepatic Function Latest Ref Rng & Units 09/02/2021 08/31/2021 04/06/2016  Total Protein 6.5 - 8.1 g/dL 6.3(L) 7.6 7.5  Albumin 3.5 - 5.0 g/dL 2.9(L) 4.0 3.8  AST 15 - 41 U/L 37 18 17  ALT 0 - 44 U/L 53(H) 14 17  Alk Phosphatase 38 - 126 U/L 75 93 106  Total Bilirubin 0.3 - 1.2 mg/dL 0.8 0.9 0.6   PT/INR No results for input(s): LABPROT, INR in the last 72 hours. ABG No results for input(s): PHART, HCO3 in the last 72 hours.  Invalid input(s): PCO2, PO2  Studies/Results:  Anti-infectives: Anti-infectives (From admission, onward)    Start     Dose/Rate Route Frequency Ordered Stop   08/31/21 1015  cefTRIAXone (ROCEPHIN) 2 g in sodium chloride 0.9 % 100 mL IVPB        2 g 200 mL/hr  over 30 Minutes Intravenous On call to O.R. 08/31/21 1010 08/31/21 1140       Medications: Scheduled Meds:  acetaminophen  1,000 mg Oral Q8H   amLODipine  5 mg Oral QHS   docusate sodium  100 mg Oral BID   donepezil  5 mg Oral QHS   enoxaparin (LOVENOX) injection  40 mg Subcutaneous Q24H   insulin aspart  0-15 Units Subcutaneous TID WC   insulin aspart  0-5 Units Subcutaneous QHS   latanoprost  1 drop Both Eyes QHS   pantoprazole  40 mg Oral Daily   pravastatin  20 mg Oral QHS   Continuous Infusions:  sodium chloride 75 mL/hr at 09/01/21 1838   PRN Meds:.diphenhydrAMINE **OR** diphenhydrAMINE, hydrALAZINE, morphine injection, ondansetron **OR** ondansetron (ZOFRAN) IV, oxyCODONE, polyethylene glycol, simethicone  Assessment/Plan: Patient Active Problem List   Diagnosis Date Noted   Cholecystitis 09/01/2021   Symptomatic cholelithiasis 08/31/2021   Essential tremor 08/31/2021   Glaucoma 08/31/2021   Arrhythmia    High cholesterol    Hypertension    Pre-diabetes    Dizzy spells 03/08/2020   Sinus bradycardia 03/08/2020   Memory loss 06/14/2015   Essential hypertension 06/14/2015   Hyperlipidemia 06/14/2015   Diabetes mellitus type 2, uncomplicated ([place]) 48/18/5631   s/p Procedure(s): LAPAROSCOPIC  CHOLECYSTECTOMY 08/31/2021 Dr Thermon Leyland HTN Essential tremor AKI - Cr improving VTe prophylaxis -scds, lovenox  Disposition: OK for home with drain when he feels up to it   LOS: 1 day    Felicie Morn, MD

## 2021-09-03 ENCOUNTER — Encounter (HOSPITAL_COMMUNITY): Payer: Self-pay | Admitting: Surgery

## 2021-09-03 LAB — GLUCOSE, CAPILLARY
Glucose-Capillary: 134 mg/dL — ABNORMAL HIGH (ref 70–99)
Glucose-Capillary: 154 mg/dL — ABNORMAL HIGH (ref 70–99)
Glucose-Capillary: 159 mg/dL — ABNORMAL HIGH (ref 70–99)

## 2021-09-03 LAB — SURGICAL PATHOLOGY

## 2021-09-03 MED ORDER — OXYCODONE HCL 5 MG PO TABS: 5.0000 mg | ORAL_TABLET | Freq: Four times a day (QID) | ORAL | 0 refills | Status: DC | PRN

## 2021-09-03 MED ORDER — ACETAMINOPHEN 500 MG PO TABS: 1000.0000 mg | ORAL_TABLET | Freq: Three times a day (TID) | ORAL | 0 refills | Status: DC | PRN

## 2021-09-03 NOTE — Discharge Summary (Signed)
Patient ID: Joseph Lamb [id number] [birth year] 70 y.o.  Admit date: 08/31/2021 Discharge date: 09/03/2021  Admitting Diagnosis: Symptomatic cholelithiasis, possible early acute cholecystitis  Discharge Diagnosis Gangrenous Cholecystitis  PAF Tremor  HTN  Consultants [facility]   H&P: Joseph Lamb is a 70yo male PMH HTN, HLD, DM, bradycardia, OSA, and mild vascular neurocognitive disorder who presented [facility] today complaining of acute onset abdominal pain. States that it woke him from sleep around 0200. He complains of RUQ/epigastric pain radiating into his back. Associated symptoms include nausea, no emesis. Denies fever, chills, diarrhea. Symptoms were unrelenting so he came to the ED. States that he had one similar severe attack severe years ago, and was told it was his gallbladder. He does report some mild RUQ after eating at times, but not this severe lately. In the ED patient was found to be hypertensive and bradycardic, otherwise VSS. CT scan shows significantly distended gallbladder with a possible noncalcified gallstone at the gallbladder neck. U/s shows sludge and cholelithiasis, positive Sonographic Murphy sign, no wall thickening or pericholecystic fluid. WBC, LFTs, lipase WNL. General surgery asked to see.   He has had nothing to eat/ drink today   Abdominal surgical history: partial prostatectomy? Anticoagulants: none Nonsmoker Denies alcohol or illicit drug use Lives at home with his wife Ambulates without assistive device  Procedures Dr. Dossie Der - Laparoscopic Cholecystectomy - 08/31/2021  Hospital Course:  Patient presented with abdominal pain and was found to have symptomatic cholelithiasis, possible early acute cholecystitis. [facility] admitted and general surgery was consulted. Patient was taken to the OR where he was found to have gangrenous cholecystitis and underwent Laparoscopic Cholecystectomy by Dr. Dossie Der on 11/25. During surgery A 19  French round channel drain was brought through the right sided incision and placed in the gallbladder fossa. Patient tolerated the procedure well and was transferred to the floor post op. Diet was advanced and tolerated. Patient worked with therapies during admission who recommended no PT follow up. On POD 3, the patient was felt stable for discharge home.  TRH recommended neurology and cardiology follow up. Follow up with PCP in 1 week for repeat labs. He was arranged for follow up in the office.   I was not directly involved in this patient's care and did not see the patient during their hospital stay, therefore the information in this discharge summary was taken entirely from the chart.    Physical Exam: Please see my attendings note for today's physical exam  Allergies as of 09/03/2021       Reactions   Lisinopril Cough   Telmisartan    Other reaction(s): decreased kidney functions   Crestor [rosuvastatin Calcium]    " fatigue"    Simvastatin Other (See Comments)   "fatigue"        Medication List     TAKE these medications    acetaminophen 500 MG tablet Commonly known as: TYLENOL Take 2 tablets (1,000 mg total) by mouth every 8 (eight) hours as needed.   amLODipine 5 MG tablet Commonly known as: NORVASC Take 1 tablet (5 mg total) by mouth daily. What changed: when to take this   aspirin EC 325 MG tablet Take 325 mg by mouth every 4 (four) hours as needed for mild pain (headache).   donepezil 5 MG tablet Commonly known as: ARICEPT Take 5 mg by mouth at bedtime.   hydrochlorothiazide 25 MG tablet Commonly known as: HYDRODIURIL Take 25 mg by mouth at bedtime.   latanoprost 0.005 %  ophthalmic solution Commonly known as: XALATAN Place 1 drop into both eyes at bedtime.   Multivitamin Men 50+ Tabs Take 1 tablet by mouth daily.   oxyCODONE 5 MG immediate release tablet Commonly known as: Oxy IR/ROXICODONE Take 1 tablet (5 mg total) by mouth every 6 (six) hours  as needed for breakthrough pain.   pravastatin 20 MG tablet Commonly known as: PRAVACHOL Take 20 mg by mouth at bedtime.   tadalafil 20 MG tablet Commonly known as: CIALIS Take 20 mg by mouth daily as needed for erectile dysfunction.   Trulicity 1.5 MG/0.5ML Sopn Generic drug: Dulaglutide Inject 1.5 mg into the skin once a week.               Durable Medical Equipment  (From admission, onward)           Start     Ordered   09/03/21 1203  For home use only DME Walker rolling  Once       Question Answer Comment  Walker: With 5 Inch Wheels   Patient needs a walker to treat with the following condition Status post surgery      09/03/21 1203              Follow-up Information     [facility] [place]. Call.   Specialty: General Surgery Why: We are working hard to schedule you a follow up appointment. Our office should contact you with your appointment date and time. Please bring a copy of your photo ID and insurance card. Please arrive 30 minutes prior to your appoitment for paperwork. Contact information: [address] [place] [address] [phone]         [facility] [place] Follow up on 09/11/2021.   Specialty: General Surgery Why: 9am. This will be nurse visit for a drain check. Please bring a copy of your photo ID and insurance card. Please arrive 30 minutes prior to your appointment for paperwork. Contact information: [address] [place] [address] [phone]         [facility]. Schedule an appointment as soon as possible for a visit.   Why: The hospitalist recommends that you follow up with Neurology as an outpatient Contact information: [address] [place], [address] [place] [address] [phone]        Revankar, Aundra Dubin, MD. Schedule an appointment as soon as possible for a visit.   Specialty: Cardiology Why: The hospitalist recommends that you follow up with your  cardiology as an outpatient. Contact information: [address] [place]  [place] [address] [phone]         Lupita Raider, MD. Schedule an appointment as soon as possible for a visit.   Specialty: Family Medicine Why: Please follow up in 1 week for repeat labs Contact information: [address] [place] [address] [phone]                 Signed: Leary Roca, [facility] 09/03/2021, 12:15 PM Please see Amion for pager number during day hours 7:00am-4:30pm

## 2021-09-03 NOTE — Progress Notes (Signed)
Joseph Lamb to be D/C'd Home with home health per MD order.  Discussed with the patient and all questions fully answered.  IV catheter discontinued intact. Site without signs and symptoms of complications. Dressing and pressure applied.  An After Visit Summary was printed and given to the patient. Patient prescriptions sent to pharmacy.  D/c education completed with patient/family including follow up instructions, medication list, d/c activities limitations if indicated, with other d/c instructions as indicated by MD - patient able to verbalize understanding, all questions fully answered. Patient educated about drain care even though he is unable to complete it without help. Patient is set up with home health for help managing drain.   Patient instructed to return to ED, call 911, or call MD for any changes in condition.   Patient escorted via WC, and D/C home via [place] transportation.  [name] 09/03/2021 3:26 PM

## 2021-09-03 NOTE — Progress Notes (Addendum)
Physical Therapy Treatment Patient Details Name: Joseph Lamb MRN: [id number] DOB: [birth year] Today's Date: 09/03/2021   History of Present Illness 70 yo male s/p lap chole on 11/25. PMH includes HTN, HLD, DM, bradycardia, OSA, and mild vascular neurocognitive disorder.    PT Comments    Pt making steady progress towards his physical therapy goals, exhibiting improved balance and activity tolerance. Pt continues with mild pain at surgical site. Ambulating 300 feet with no assistive device at a supervision level and negotiated 2 steps with railings to simulate home set up. Education provided regarding activity recommendations post op. No PT follow up indicated.    Recommendations for follow up therapy are one component of a multi-disciplinary discharge planning process, led by the attending physician.  Recommendations may be updated based on patient status, additional functional criteria and insurance authorization.  Follow Up Recommendations  No PT follow up     Assistance Recommended at Discharge Intermittent Supervision/Assistance  Equipment Recommendations  Rolling walker (2 wheels)    Recommendations for Other Services       Precautions / Restrictions Precautions Precautions: Fall Precaution Comments: R JP drain Restrictions Weight Bearing Restrictions: No     Mobility  Bed Mobility Overal bed mobility: Needs Assistance Bed Mobility: Rolling;Sidelying to Sit Rolling: Supervision Sidelying to sit: Supervision       General bed mobility comments: OOB in chair    Transfers Overall transfer level: Independent Equipment used: None Transfers: Sit to/from Stand Sit to Stand: Min guard           General transfer comment: min guard for safety, cueing for hand placement    Ambulation/Gait Ambulation/Gait assistance: Supervision Gait Distance (Feet): 300 Feet Assistive device: None Gait Pattern/deviations: Step-through pattern;Decreased stride length Gait  velocity: decreased     General Gait Details: cues for upright posture, scapular retraction, reciprocal arm swing. supervision for safety, no overt LOB   Stairs Stairs: Yes Stairs assistance: Modified independent (Device/Increase time) Stair Management: Two rails Number of Stairs: 2 General stair comments: cues for step by step   Wheelchair Mobility    Modified Rankin (Stroke Patients Only)       Balance Overall balance assessment: Mild deficits observed, not formally tested                                          Cognition Arousal/Alertness: Awake/alert Behavior During Therapy: WFL for tasks assessed/performed Overall Cognitive Status: History of cognitive impairments - at baseline                                 General Comments: history of mild neurocognitive disorder, pleasant and follows commands well        Exercises      General Comments General comments (skin integrity, edema, etc.): VSS      Pertinent Vitals/Pain Pain Assessment: Faces Pain Score: 4  Faces Pain Scale: Hurts even more Pain Location: abdomen Pain Descriptors / Indicators: Sore;Discomfort Pain Intervention(s): Limited activity within patient's tolerance;Monitored during session;Patient requesting pain meds-RN notified    Home Living Family/patient expects to be discharged to:: Private residence Living Arrangements: Spouse/significant other Available Help at Discharge: Family Type of Home: House Home Access: Stairs to enter   [employer] of Steps: 2   Home Layout: One level Home Equipment: Grab bars - tub/shower  Prior Function            PT Goals (current goals can now be found in the care plan section) Acute Rehab PT Goals Patient Stated Goal: home Potential to Achieve Goals: Good Progress towards PT goals: Progressing toward goals    Frequency    Min 3X/week      PT Plan Current plan remains appropriate     Co-evaluation              AM-PAC PT "6 Clicks" Mobility   Outcome Measure  Help needed turning from your back to your side while in a flat bed without using bedrails?: None Help needed moving from lying on your back to sitting on the side of a flat bed without using bedrails?: None Help needed moving to and from a bed to a chair (including a wheelchair)?: A Little Help needed standing up from a chair using your arms (e.g., wheelchair or bedside chair)?: None Help needed to walk in hospital room?: A Little Help needed climbing 3-5 steps with a railing? : None 6 Click Score: 22    End of Session   Activity Tolerance: Patient tolerated treatment well Patient left: in chair;with call bell/phone within reach Nurse Communication: Mobility status;Patient requests pain meds PT Visit Diagnosis: Other abnormalities of gait and mobility (R26.89)     Time: 7893-8101 PT Time Calculation (min) (ACUTE ONLY): 20 min  Charges:  $Gait Training: 8-22 mins                     Joseph Lamb, PT, DPT Acute Rehabilitation Services Pager [phone] Office [phone]    Joseph Lamb 09/03/2021, 12:41 PM

## 2021-09-03 NOTE — Discharge Instructions (Signed)
[facility]  Please arrive at least 30 min before your appointment to complete your check in paperwork.  If you are unable to arrive 30 min prior to your appointment time we may have to cancel or reschedule you. LAPAROSCOPIC SURGERY: POST OP INSTRUCTIONS Always review your discharge instruction sheet given to you by the facility where your surgery was performed. IF YOU HAVE DISABILITY OR FAMILY LEAVE FORMS, YOU MUST BRING THEM TO THE OFFICE FOR PROCESSING.   DO NOT GIVE THEM TO YOUR DOCTOR.  PAIN CONTROL  First take acetaminophen (Tylenol) AND/or ibuprofen (Advil) to control your pain after surgery.  Follow directions on package.  Taking acetaminophen (Tylenol) and/or ibuprofen (Advil) regularly after surgery will help to control your pain and lower the amount of prescription pain medication you may need.  You should not take more than 4,000 mg (4 grams) of acetaminophen (Tylenol) in 24 hours.  You should not take ibuprofen (Advil), aleve, motrin, naprosyn or other NSAIDS if you have a history of stomach ulcers or chronic kidney disease.  A prescription for pain medication may be given to you upon discharge.  Take your pain medication as prescribed, if you still have uncontrolled pain after taking acetaminophen (Tylenol) or ibuprofen (Advil). Use ice packs to help control pain. If you need a refill on your pain medication, please contact your pharmacy.  They will contact our office to request authorization. Prescriptions will not be filled after 5pm or on week-ends.  HOME MEDICATIONS Take your usually prescribed medications unless otherwise directed.  DIET You should follow a light diet the first few days after arrival home.  Be sure to include lots of fluids daily. Avoid fatty, fried foods.   CONSTIPATION It is common to experience some constipation after surgery and if you are taking pain medication.  Increasing fluid intake and taking a stool softener (such as Colace)  will usually help or prevent this problem from occurring.  A mild laxative (Milk of Magnesia or Miralax) should be taken according to package instructions if there are no bowel movements after 48 hours.  WOUND/INCISION CARE Most patients will experience some swelling and bruising in the area of the incisions.  Ice packs will help.  Swelling and bruising can take several days to resolve.  Unless discharge instructions indicate otherwise, follow guidelines below  STERI-STRIPS - you may remove your outer bandages 48 hours after surgery, and you may shower at that time.  You have steri-strips (small skin tapes) in place directly over the incision.  These strips should be left on the skin for 7-10 days.   DERMABOND/SKIN GLUE - you may shower in 24 hours.  The glue will flake off over the next 2-3 weeks. Any sutures or staples will be removed at the office during your follow-up visit.  ACTIVITIES You may resume regular (light) daily activities beginning the next day--such as daily self-care, walking, climbing stairs--gradually increasing activities as tolerated.  You may have sexual intercourse when it is comfortable.  Refrain from any heavy lifting or straining until approved by your doctor. You may drive when you are no longer taking prescription pain medication, you can comfortably wear a seatbelt, and you can safely maneuver your car and apply brakes.  FOLLOW-UP You should see your doctor in the office for a follow-up appointment approximately 2-3 weeks after your surgery.  You should have been given your post-op/follow-up appointment when your surgery was scheduled.  If you did not receive a post-op/follow-up appointment, make sure   that you call for this appointment within a day or two after you arrive home to insure a convenient appointment time.   WHEN TO CALL YOUR DOCTOR: Fever over 101.0 Inability to urinate Continued bleeding from incision. Increased pain, redness, or drainage from the  incision. Increasing abdominal pain  The clinic staff is available to answer your questions during regular business hours.  Please don't hesitate to call and ask to speak to one of the nurses for clinical concerns.  If you have a medical emergency, go to the nearest emergency room or call 911.  A surgeon from [facility] is always on call at the hospital. [address], [address], [place], [place]  [address] ? [address] [address], [place], [place]   [address] [phone] ? [phone] ? [phone]  

## 2021-09-03 NOTE — Anesthesia Postprocedure Evaluation (Signed)
Anesthesia Post Note  Patient: Joseph Lamb  Procedure(s) Performed: LAPAROSCOPIC CHOLECYSTECTOMY (Abdomen)     Patient location during evaluation: PACU Anesthesia Type: General Level of consciousness: awake and alert Pain management: pain level controlled Vital Signs Assessment: post-procedure vital signs reviewed and stable Respiratory status: spontaneous breathing, nonlabored ventilation and respiratory function stable Cardiovascular status: blood pressure returned to baseline and stable Postop Assessment: no apparent nausea or vomiting Anesthetic complications: no   No notable events documented.  Last Vitals:  Vitals:   09/02/21 2034 09/03/21 0434  BP: (!) 170/64 (!) 155/67  Pulse: 86 (!) 54  Resp: 17 17  Temp: 37.2 C 37 C  SpO2: 100% 92%    Last Pain:  Vitals:   09/03/21 0434  TempSrc: Oral  PainSc:                  Mellody Dance

## 2021-09-03 NOTE — Evaluation (Signed)
Occupational Therapy Evaluation Patient Details Name: Joseph Lamb MRN: [id number] DOB: December 25, 1950 Today's Date: 09/03/2021   History of Present Illness 70 yo male s/p lap chole on 11/25. PMH includes HTN, HLD, DM, bradycardia, OSA, and mild vascular neurocognitive disorder.   Clinical Impression   PTA patient reports independent with ADLs, mobility and most IADLs (reports spouse assists with cooking).  Admitted for above and limited by problem list below, including generalized weakness, abdominal pain, decreased activity tolerance and impaired balance. He completes ADLs with up to min guard assist, transfers and mobility in room with min guard assist using RW.  Pt voices concern with dc home, as his spouse is sick and she will not be able to assist as much as typical; discussed pts son assisting and possible microwave meals.  Believe he will benefit from further OT services while admitted and after dc at [facility] level to optimize independence and return to PLOF.  Will follow acutely.      Recommendations for follow up therapy are one component of a multi-disciplinary discharge planning process, led by the attending physician.  Recommendations may be updated based on patient status, additional functional criteria and insurance authorization.   Follow Up Recommendations  Home health OT    Assistance Recommended at Discharge Frequent or constant Supervision/Assistance  Functional Status Assessment  Patient has had a recent decline in their functional status and demonstrates the ability to make significant improvements in function in a reasonable and predictable amount of time.  Equipment Recommendations  Tub/shower seat    Recommendations for Other Services       Precautions / Restrictions Precautions Precautions: Fall Precaution Comments: R JP drain Restrictions Weight Bearing Restrictions: No      Mobility Bed Mobility Overal bed mobility: Needs Assistance Bed Mobility:  Rolling;Sidelying to Sit Rolling: Supervision Sidelying to sit: Supervision       General bed mobility comments: for safety, no assist required    Transfers Overall transfer level: Needs assistance Equipment used: None Transfers: Sit to/from Stand Sit to Stand: Min guard           General transfer comment: min guard for safety, cueing for hand placement      Balance Overall balance assessment: Mild deficits observed, not formally tested                                         ADL either performed or assessed with clinical judgement   ADL Overall ADL's : Needs assistance/impaired     Grooming: Supervision/safety;Standing   Upper Body Bathing: Set up;Sitting   Lower Body Bathing: Min guard;Sit to/from stand   Upper Body Dressing : Set up;Sitting   Lower Body Dressing: Min guard;Sit to/from stand   Toilet Transfer: Min guard;Ambulation;Rolling walker (2 wheels);Regular Toilet   Toileting- Architect and Hygiene: Min guard;Sit to/from stand       Functional mobility during ADLs: Min guard;Rolling walker (2 wheels);Cueing for safety General ADL Comments: pt utilized RW initally, but pushed RW away towards end of session     Vision         Perception     Praxis      Pertinent Vitals/Pain Pain Assessment: 0-10 Pain Score: 4  Pain Location: abdomen Pain Descriptors / Indicators: Sore;Discomfort Pain Intervention(s): Limited activity within patient's tolerance;Monitored during session;Repositioned     Hand Dominance Right   Extremity/Trunk Assessment Upper Extremity Assessment  Upper Extremity Assessment: Generalized weakness   Lower Extremity Assessment Lower Extremity Assessment: Defer to PT evaluation   Cervical / Trunk Assessment Cervical / Trunk Assessment: Other exceptions Cervical / Trunk Exceptions: abdominal surgery   Communication Communication Communication: No difficulties   Cognition Arousal/Alertness:  Awake/alert Behavior During Therapy: WFL for tasks assessed/performed Overall Cognitive Status: History of cognitive impairments - at baseline                                 General Comments: history of mild neurocognitive disorder, pleasant and follows commands well; decreased STM noted     General Comments  VSS    Exercises     Shoulder Instructions      Home Living Family/patient expects to be discharged to:: Private residence Living Arrangements: Spouse/significant other Available Help at Discharge: Family Type of Home: House Home Access: Stairs to enter Secretary/administrator of Steps: 2   Home Layout: One level     Bathroom Shower/Tub: Chief Strategy Officer: Standard     Home Equipment: Grab bars - tub/shower          Prior Functioning/Environment Prior Level of Function : Independent/Modified Independent;Driving             Mobility Comments: pt states he is completely independent, does not use AD for mobility. ADLs Comments: independent, drives, IADLs        OT Problem List: Decreased strength;Decreased activity tolerance;Impaired balance (sitting and/or standing);Decreased cognition;Decreased safety awareness;Decreased knowledge of use of DME or AE      OT Treatment/Interventions: Self-care/ADL training;Therapeutic exercise;DME and/or AE instruction;Therapeutic activities;Patient/family education;Balance training;Energy conservation    OT Goals(Current goals can be found in the care plan section) Acute Rehab OT Goals Patient Stated Goal: to get better and home OT Goal Formulation: With patient Time For Goal Achievement: 09/17/21 Potential to Achieve Goals: Good  OT Frequency: Min 2X/week   Barriers to D/C:            Co-evaluation              AM-PAC OT "6 Clicks" Daily Activity     Outcome Measure Help from another person eating meals?: A Little Help from another person taking care of personal grooming?:  A Little Help from another person toileting, which includes using toliet, bedpan, or urinal?: A Little Help from another person bathing (including washing, rinsing, drying)?: A Little Help from another person to put on and taking off regular upper body clothing?: A Little Help from another person to put on and taking off regular lower body clothing?: A Little 6 Click Score: 18   End of Session Equipment Utilized During Treatment: Rolling walker (2 wheels) Nurse Communication: Mobility status  Activity Tolerance: Patient tolerated treatment well Patient left: in chair;with call bell/phone within reach;with chair alarm set  OT Visit Diagnosis: Other abnormalities of gait and mobility (R26.89);Muscle weakness (generalized) (M62.81)                Time: 5638-7564 OT Time Calculation (min): 17 min Charges:  OT General Charges $OT Visit: 1 Visit OT Evaluation $OT Eval Moderate Complexity: 1 Mod  Barry Brunner, OT Acute Rehabilitation Services Pager [phone] Office [phone]   Chancy Milroy 09/03/2021, 11:03 AM

## 2021-09-03 NOTE — TOC Progression Note (Signed)
Transition of Care [facility]) - Progression Note    Patient Details  Name: Joseph Lamb MRN: [id number] Date of Birth: [birth year]  [facility] CM/SW Contact  Huston Foley Jacklynn Ganong, RN Phone Number: 09/03/2021, 2:19 PM  Clinical Narrative:   Patient will be discharge with JP drain. CM spoke with him concerning need for [facility] to assist with education and drainage. Referral called to [facility] with [facility]. Patient appreciative of service.     Expected Discharge Plan: Home w Home Health Services Barriers to Discharge: No Barriers Identified  Expected Discharge Plan and Services Expected Discharge Plan: Home w Home Health Services In-house Referral: NA Discharge Planning Services: CM Consult Post Acute Care Choice: Home Health Living arrangements for the past 2 months: Single Family Home Expected Discharge Date: 09/03/21               DME Arranged: N/A DME Agency: NA       HH Arranged: RN HH Agency: [facility] Date [facility] Agency Contacted: 09/03/21 Time HH Agency Contacted: 1348 Representative spoke with at [facility] Agency: Kandee Keen   Social Determinants of Health (SDOH) Interventions    Readmission Risk Interventions No flowsheet data found.

## 2021-09-03 NOTE — Progress Notes (Signed)
Patient seen and reviewed and seems stabilized from general medicine perspective-he has generalized tremor which may preclude him getting dressing changes Otherwise from my perspective he can discharge home to the care of his family once therapy has seen him  I have discussed his care with general surgeon today and from our perspective patient is stable for discharge.  Pleas Koch, MD [facility] 10:26 AM

## 2021-09-11 ENCOUNTER — Other Ambulatory Visit (HOSPITAL_COMMUNITY): Payer: Self-pay | Admitting: Surgery

## 2021-09-11 ENCOUNTER — Telehealth: Payer: Self-pay

## 2021-09-11 DIAGNOSIS — Z9049 Acquired absence of other specified parts of digestive tract: Secondary | ICD-10-CM

## 2021-09-11 DIAGNOSIS — E782 Mixed hyperlipidemia: Secondary | ICD-10-CM

## 2021-09-11 MED ORDER — PRAVASTATIN SODIUM 40 MG PO TABS: 20.0000 mg | ORAL_TABLET | Freq: Every day | ORAL | 3 refills | Status: DC

## 2021-09-11 NOTE — Telephone Encounter (Signed)
Pt advised to increase Pravastatin to 40 mg daily and repeat labs in 6 weeks.

## 2021-09-14 ENCOUNTER — Other Ambulatory Visit (HOSPITAL_COMMUNITY): Payer: Self-pay | Admitting: Surgery

## 2021-09-14 ENCOUNTER — Ambulatory Visit (HOSPITAL_COMMUNITY)
Admission: RE | Admit: 2021-09-14 | Discharge: 2021-09-14 | Disposition: A | Discharge status: Home / Self Care | Payer: Medicare Other | Source: Ambulatory Visit | Attending: Surgery | Admitting: Surgery

## 2021-09-14 ENCOUNTER — Other Ambulatory Visit: Payer: Self-pay

## 2021-09-14 DIAGNOSIS — Z9049 Acquired absence of other specified parts of digestive tract: Secondary | ICD-10-CM | POA: Diagnosis present

## 2021-09-14 MED ORDER — TECHNETIUM TC 99M MEBROFENIN IV KIT
5.0000 | PACK | Freq: Once | INTRAVENOUS | Status: AC | PRN
  Administered 2021-09-14: 5 via INTRAVENOUS

## 2021-09-26 ENCOUNTER — Telehealth: Payer: Self-pay | Admitting: Cardiology

## 2021-09-26 NOTE — Telephone Encounter (Signed)
Left VM to call back 

## 2021-09-26 NOTE — Telephone Encounter (Signed)
Lawanna Kobus, nurse with [facility] called wanting to let us know that patient's HR today was 48-50 regular.  He has had 4-5 visits between her and OT and his HR was in the 70's.  Last week was in the 50's but irregular.   She also wants to know if he should be on 81mg  of aspirin.

## 2021-10-03 ENCOUNTER — Telehealth: Payer: Self-pay | Admitting: Cardiology

## 2021-10-03 NOTE — Telephone Encounter (Signed)
Recommendations reviewed with Merri Brunette nurse as per Dr. Kem Parkinson note.  Lawanna Kobus verbalized understanding and had no additional questions.   Message left for pt to callback.

## 2021-10-03 NOTE — Telephone Encounter (Signed)
Spoke with Joseph Lamb from [place] who wanted to let us know that that the pt has been having low heart rate. Today his BP is 116/70 HR 42. BP the past few visits were 102/62, 110/58 and 120/74. Pt is asymptomatic. How do you advise?

## 2021-10-03 NOTE — Telephone Encounter (Signed)
STAT if HR is under 50 or over 120 (normal HR is 60-100 beats per minute)  What is your heart rate? 43  Do you have a log of your heart rate readings (document readings)?  50, 49, 52, 62, 78  Do you have any other symptoms? No. Home Health Nurse reports patient is asymptomatic

## 2021-10-11 ENCOUNTER — Other Ambulatory Visit: Payer: Self-pay

## 2021-10-11 DIAGNOSIS — E119 Type 2 diabetes mellitus without complications: Secondary | ICD-10-CM | POA: Insufficient documentation

## 2021-10-11 DIAGNOSIS — T466X5A Adverse effect of antihyperlipidemic and antiarteriosclerotic drugs, initial encounter: Secondary | ICD-10-CM | POA: Insufficient documentation

## 2021-10-11 DIAGNOSIS — E1169 Type 2 diabetes mellitus with other specified complication: Secondary | ICD-10-CM

## 2021-10-11 DIAGNOSIS — F0150 Vascular dementia without behavioral disturbance: Secondary | ICD-10-CM | POA: Insufficient documentation

## 2021-10-11 DIAGNOSIS — H401122 Primary open-angle glaucoma, left eye, moderate stage: Secondary | ICD-10-CM

## 2021-10-11 DIAGNOSIS — E782 Mixed hyperlipidemia: Secondary | ICD-10-CM | POA: Insufficient documentation

## 2021-10-11 DIAGNOSIS — N529 Male erectile dysfunction, unspecified: Secondary | ICD-10-CM

## 2021-10-11 DIAGNOSIS — N528 Other male erectile dysfunction: Secondary | ICD-10-CM | POA: Insufficient documentation

## 2021-10-11 DIAGNOSIS — I4891 Unspecified atrial fibrillation: Secondary | ICD-10-CM

## 2021-10-11 DIAGNOSIS — H401111 Primary open-angle glaucoma, right eye, mild stage: Secondary | ICD-10-CM

## 2021-10-11 DIAGNOSIS — N183 Chronic kidney disease, stage 3 unspecified: Secondary | ICD-10-CM

## 2021-10-11 DIAGNOSIS — R251 Tremor, unspecified: Secondary | ICD-10-CM

## 2021-10-11 DIAGNOSIS — E669 Obesity, unspecified: Secondary | ICD-10-CM

## 2021-10-11 HISTORY — DX: Type 2 diabetes mellitus without complications: E11.9

## 2021-10-11 HISTORY — DX: Other male erectile dysfunction: N52.8

## 2021-10-11 HISTORY — DX: Adverse effect of antihyperlipidemic and antiarteriosclerotic drugs, initial encounter: T46.6X5A

## 2021-10-11 HISTORY — DX: Chronic kidney disease, stage 3 unspecified: N18.30

## 2021-10-11 HISTORY — DX: Vascular dementia, unspecified severity, without behavioral disturbance, psychotic disturbance, mood disturbance, and anxiety: F01.50

## 2021-10-11 HISTORY — DX: Obesity, unspecified: E66.9

## 2021-10-11 HISTORY — DX: Unspecified atrial fibrillation: I48.91

## 2021-10-11 HISTORY — DX: Primary open-angle glaucoma, left eye, moderate stage: H40.1122

## 2021-10-11 HISTORY — DX: Tremor, unspecified: R25.1

## 2021-10-11 HISTORY — DX: Primary open-angle glaucoma, right eye, mild stage: H40.1111

## 2021-10-11 HISTORY — DX: Type 2 diabetes mellitus with other specified complication: E11.69

## 2021-10-11 HISTORY — DX: Male erectile dysfunction, unspecified: N52.9

## 2021-10-11 HISTORY — DX: Mixed hyperlipidemia: E78.2

## 2021-10-18 ENCOUNTER — Encounter: Payer: Self-pay | Admitting: Cardiology

## 2021-10-18 ENCOUNTER — Ambulatory Visit: Payer: Medicare Other | Admitting: Cardiology

## 2021-10-18 ENCOUNTER — Other Ambulatory Visit: Payer: Self-pay

## 2021-10-18 VITALS — BP 120/60 | HR 49 | Ht 74.0 in | Wt 215.0 lb

## 2021-10-18 DIAGNOSIS — E119 Type 2 diabetes mellitus without complications: Secondary | ICD-10-CM

## 2021-10-18 DIAGNOSIS — E782 Mixed hyperlipidemia: Secondary | ICD-10-CM | POA: Diagnosis not present

## 2021-10-18 DIAGNOSIS — N183 Chronic kidney disease, stage 3 unspecified: Secondary | ICD-10-CM

## 2021-10-18 DIAGNOSIS — I10 Essential (primary) hypertension: Secondary | ICD-10-CM | POA: Diagnosis not present

## 2021-10-18 MED ORDER — PRAVASTATIN SODIUM 40 MG PO TABS: 40.0000 mg | ORAL_TABLET | Freq: Every day | ORAL | 3 refills | Status: AC

## 2021-10-18 NOTE — Patient Instructions (Addendum)
Medication Instructions:  °Your physician recommends that you continue on your current medications as directed. Please refer to the Current Medication list given to you today. ° °*If you need a refill on your cardiac medications before your next appointment, please call your pharmacy* ° ° °Lab Work: °None °If you have labs (blood work) drawn today and your tests are completely normal, you will receive your results only by: °MyChart Message (if you have MyChart) OR °A paper copy in the mail °If you have any lab test that is abnormal or we need to change your treatment, we will call you to review the results. ° ° °Testing/Procedures: °None ° ° °Follow-Up: °At [facility], you and your health needs are our priority.  As part of our continuing mission to provide you with exceptional heart care, we have created designated Provider Care Teams.  These Care Teams include your primary Cardiologist (physician) and Advanced Practice Providers (APPs -  Physician Assistants and Nurse Practitioners) who all work together to provide you with the care you need, when you need it. ° °We recommend signing up for the patient portal called "MyChart".  Sign up information is provided on this After Visit Summary.  MyChart is used to connect with patients for Virtual Visits (Telemedicine).  Patients are able to view lab/test results, encounter notes, upcoming appointments, etc.  Non-urgent messages can be sent to your provider as well.   °To learn more about what you can do with MyChart, go to [email or web address].   ° °Your next appointment:   °9 month(s) ° °The format for your next appointment:   °In Person ° °Provider:   °Dr. Revankar ° ° °Other Instructions ° ° °

## 2021-10-18 NOTE — Progress Notes (Signed)
Cardiology Office Note:    Date:  10/18/2021   ID:  Joseph Lamb, DOB [birth year], MRN [id number]  PCP:  Lupita Raider, MD  Cardiologist:  Garwin Brothers, MD   Referring MD: Lupita Raider, MD    ASSESSMENT:    1. Essential hypertension   2. Mixed hyperlipidemia   3. Type 2 diabetes mellitus without complication, without long-term current use of insulin (HCC)   4. Stage 3 chronic kidney disease, unspecified whether stage 3a or 3b CKD (HCC)    PLAN:    In order of problems listed above:  Primary prevention stressed with the patient.  He was advised to walk at least half an hour a day 5 days a week and he promises to do so. Essential hypertension: Blood pressure stable and lifestyle modification was also discussed.  He is doing well with diet.  His wife is very supportive. Mixed dyslipidemia: Lipids reviewed and are elevated.  These are managed by his primary care.  Diet was emphasized. Diabetes mellitus: Managed by primary care.  Importance of exercise and compliance with medication urged. Questions were answered to her satisfaction.Patient will be seen in follow-up appointment in 9 months or earlier if the patient has any concerns    Medication Adjustments/Labs and Tests Ordered: Current medicines are reviewed at length with the patient today.  Concerns regarding medicines are outlined above.  No orders of the defined types were placed in this encounter.  No orders of the defined types were placed in this encounter.    No chief complaint on file.    History of Present Illness:    Joseph Lamb is a 71 y.o. male.  Patient has past medical history of essential hypertension, mixed dyslipidemia and diabetes mellitus.  He denies any problems at this time and takes care of activities of daily living.  He has issues with dementia for which she takes medications.  Overall he is interaction with he was appropriate.  He tells me that he walks on a regular basis.  At the  time of my evaluation, the patient is alert awake oriented and in no distress.  Past Medical History:  Diagnosis Date   Adverse effect of antihyperlipidemic and antiarteriosclerotic drugs, initial encounter 10/11/2021   Arrhythmia    Atrial fibrillation (HCC)    Cholecystitis 09/01/2021   Chronic kidney disease, stage 3 unspecified (HCC) 10/11/2021   Diabetes mellitus type 2, uncomplicated (HCC) 06/14/2015   Dizzy spells 03/08/2020   Essential hypertension 06/14/2015   Essential tremor 08/31/2021   Glaucoma 08/31/2021   High cholesterol    Hyperlipidemia 06/14/2015   Hypertension    Memory loss 06/14/2015   Mixed hyperlipidemia 10/11/2021   Obesity 10/11/2021   Other male erectile dysfunction 10/11/2021   Pre-diabetes    Primary open angle glaucoma (POAG) of left eye, moderate stage 10/11/2021   Primary open-angle glaucoma, right eye, mild stage 10/11/2021   Sinus bradycardia 03/08/2020   Symptomatic cholelithiasis 08/31/2021   Tremor 10/11/2021   Type 2 diabetes mellitus with other specified complication (HCC) 10/11/2021   Vascular dementia (HCC) 10/11/2021    Past Surgical History:  Procedure Laterality Date   CHOLECYSTECTOMY N/A 08/31/2021   Procedure: LAPAROSCOPIC CHOLECYSTECTOMY;  Surgeon: Quentin Ore, MD;  Location: [facility] OR;  Service: General;  Laterality: N/A;   EYE SURGERY     PROSTATE SURGERY      Current Medications: Current Meds  Medication Sig   amLODipine (NORVASC) 5 MG tablet Take 5 mg by mouth daily.  aspirin EC 325 MG tablet Take 325 mg by mouth every 4 (four) hours as needed for mild pain (headache).   donepezil (ARICEPT) 5 MG tablet Take 5 mg by mouth at bedtime.   hydrochlorothiazide (HYDRODIURIL) 25 MG tablet Take 25 mg by mouth at bedtime.   latanoprost (XALATAN) 0.005 % ophthalmic solution Place 1 drop into both eyes at bedtime.   Multiple Vitamins-Minerals (MULTIVITAMIN MEN 50+) TABS Take 1 tablet by mouth daily.   pravastatin (PRAVACHOL) 40 MG tablet Take  40 mg by mouth daily.   tadalafil (CIALIS) 20 MG tablet Take 20 mg by mouth daily as needed for erectile dysfunction.   TRULICITY 1.5 MG/0.5ML SOPN Inject 1.5 mg into the skin once a week.     Allergies:   Lisinopril, Telmisartan, Crestor [rosuvastatin calcium], and Simvastatin   Social History   Socioeconomic History   Marital status: Married    Spouse name: Not on file   Number of children: 2   Years of education: 15   Highest education level: Not on file  Occupational History   Occupation: Retired  Tobacco Use   Smoking status: Never   Smokeless tobacco: Never  Vaping Use   Vaping Use: Never used  Substance and Sexual Activity   Alcohol use: Not Currently    Alcohol/week: 0.0 standard drinks    Comment: rare   Drug use: No   Sexual activity: Not on file  Other Topics Concern   Not on file  Social History Narrative   Right handed   One story home   Drinks no caffeine, occ soda.   Lives with wife and son   Social Determinants of Health   Financial Resource Strain: Not on file  Food Insecurity: Not on file  Transportation Needs: Not on file  Physical Activity: Not on file  Stress: Not on file  Social Connections: Not on file     Family History: The patient's family history includes Diabetes in his father and mother.  ROS:   Please see the history of present illness.    All other systems reviewed and are negative.  EKGs/Labs/Other Studies Reviewed:    The following studies were reviewed today: I discussed my findings with the patient at length   Recent Labs: 09/02/2021: ALT 53; BUN 14; Creatinine, Ser 1.47; Hemoglobin 13.7; Platelets 189; Potassium 3.8; Sodium 140  Recent Lipid Panel No results found for: CHOL, TRIG, HDL, CHOLHDL, VLDL, LDLCALC, LDLDIRECT  Physical Exam:    VS:  BP 120/60    Pulse (!) 49    Ht 6\' 2"  (1.88 m)    Wt 215 lb 0.6 oz (97.5 kg)    SpO2 98%    BMI 27.61 kg/m     Wt Readings from Last 3 Encounters:  10/18/21 215 lb 0.6 oz  (97.5 kg)  09/03/21 221 lb 12.5 oz (100.6 kg)  05/09/21 222 lb 3.2 oz (100.8 kg)     GEN: Patient is in no acute distress HEENT: Normal NECK: No JVD; No carotid bruits LYMPHATICS: No lymphadenopathy CARDIAC: Hear sounds regular, 2/6 systolic murmur at the apex. RESPIRATORY:  Clear to auscultation without rales, wheezing or rhonchi  ABDOMEN: Soft, non-tender, non-distended MUSCULOSKELETAL:  No edema; No deformity  SKIN: Warm and dry NEUROLOGIC:  Alert and oriented x 3 PSYCHIATRIC:  Normal affect   Signed, 07/09/21, MD  10/18/2021 2:15 PM    [facility]

## 2021-10-23 ENCOUNTER — Encounter: Payer: Self-pay | Admitting: Podiatry

## 2021-10-23 ENCOUNTER — Ambulatory Visit: Payer: Medicare Other | Admitting: Podiatry

## 2021-10-23 ENCOUNTER — Other Ambulatory Visit: Payer: Self-pay

## 2021-10-23 DIAGNOSIS — E1169 Type 2 diabetes mellitus with other specified complication: Secondary | ICD-10-CM | POA: Diagnosis not present

## 2021-10-23 DIAGNOSIS — M79674 Pain in right toe(s): Secondary | ICD-10-CM | POA: Diagnosis not present

## 2021-10-23 DIAGNOSIS — L853 Xerosis cutis: Secondary | ICD-10-CM

## 2021-10-23 DIAGNOSIS — B351 Tinea unguium: Secondary | ICD-10-CM | POA: Diagnosis not present

## 2021-10-23 DIAGNOSIS — M2012 Hallux valgus (acquired), left foot: Secondary | ICD-10-CM

## 2021-10-23 DIAGNOSIS — M79675 Pain in left toe(s): Secondary | ICD-10-CM | POA: Diagnosis not present

## 2021-10-23 DIAGNOSIS — E119 Type 2 diabetes mellitus without complications: Secondary | ICD-10-CM

## 2021-10-23 DIAGNOSIS — M2011 Hallux valgus (acquired), right foot: Secondary | ICD-10-CM | POA: Diagnosis not present

## 2021-10-23 NOTE — Patient Instructions (Signed)
Diabetes Mellitus and Foot Care Foot care is an important part of your health, especially when you have diabetes. Diabetes may cause you to have problems because of poor blood flow (circulation) to your feet and legs, which can cause your skin to: Become thinner and drier. Break more easily. Heal more slowly. Peel and crack. You may also have nerve damage (neuropathy) in your legs and feet, causing decreased feeling in them. This means that you may not notice minor injuries to your feet that could lead to more serious problems. Noticing and addressing any potential problems early is the best way to prevent future foot problems. How to care for your feet Foot hygiene  Wash your feet daily with warm water and mild soap. Do not use hot water. Then, pat your feet and the areas between your toes until they are completely dry. Do not soak your feet as this can dry your skin. Trim your toenails straight across. Do not dig under them or around the cuticle. File the edges of your nails with an emery board or nail file. Apply a moisturizing lotion or petroleum jelly to the skin on your feet and to dry, brittle toenails. Use lotion that does not contain alcohol and is unscented. Do not apply lotion between your toes. Shoes and socks Wear clean socks or stockings every day. Make sure they are not too tight. Do not wear knee-high stockings since they may decrease blood flow to your legs. Wear shoes that fit properly and have enough cushioning. Always look in your shoes before you put them on to be sure there are no objects inside. To break in new shoes, wear them for just a few hours a day. This prevents injuries on your feet. Wounds, scrapes, corns, and calluses  Check your feet daily for blisters, cuts, bruises, sores, and redness. If you cannot see the bottom of your feet, use a mirror or ask someone for help. Do not cut corns or calluses or try to remove them with medicine. If you find a minor scrape,  cut, or break in the skin on your feet, keep it and the skin around it clean and dry. You may clean these areas with mild soap and water. Do not clean the area with peroxide, alcohol, or iodine. If you have a wound, scrape, corn, or callus on your foot, look at it several times a day to make sure it is healing and not infected. Check for: Redness, swelling, or pain. Fluid or blood. Warmth. Pus or a bad smell. General tips Do not cross your legs. This may decrease blood flow to your feet. Do not use heating pads or hot water bottles on your feet. They may burn your skin. If you have lost feeling in your feet or legs, you may not know this is happening until it is too late. Protect your feet from hot and cold by wearing shoes, such as at the beach or on hot pavement. Schedule a complete foot exam at least once a year (annually) or more often if you have foot problems. Report any cuts, sores, or bruises to your health care provider immediately. Where to find more information American Diabetes Association: www.diabetes.org Association of Diabetes Care & Education Specialists: www.diabeteseducator.org Contact a health care provider if: You have a medical condition that increases your risk of infection and you have any cuts, sores, or bruises on your feet. You have an injury that is not healing. You have redness on your legs or feet. You  feel burning or tingling in your legs or feet. You have pain or cramps in your legs and feet. Your legs or feet are numb. Your feet always feel cold. You have pain around any toenails. Get help right away if: You have a wound, scrape, corn, or callus on your foot and: You have pain, swelling, or redness that gets worse. You have fluid or blood coming from the wound, scrape, corn, or callus. Your wound, scrape, corn, or callus feels warm to the touch. You have pus or a bad smell coming from the wound, scrape, corn, or callus. You have a fever. You have a red  line going up your leg. Summary Check your feet every day for blisters, cuts, bruises, sores, and redness. Apply a moisturizing lotion or petroleum jelly to the skin on your feet and to dry, brittle toenails. Wear shoes that fit properly and have enough cushioning. If you have foot problems, report any cuts, sores, or bruises to your health care provider immediately. Schedule a complete foot exam at least once a year (annually) or more often if you have foot problems. This information is not intended to replace advice given to you by your health care provider. Make sure you discuss any questions you have with your health care provider. Document Revised: 04/13/2020 Document Reviewed: 04/13/2020 Elsevier Patient Education  2022 [employer].  Choose a moisturizer from  the list below:  For normal skin: Moisturize feet once daily; do not apply between toes A.  CeraVe Daily Moisturizing Lotion B.  Lubriderm Advanced Therapy Lotion or Lubriderm Intense Skin Repair Lotion C.  Vaseline Intensive Care Lotion D.  Gold Bond Ultimate Diabetic Foot Lotion E.  Eucerin Intensive Repair Moisturizing Lotion  For extremely dry, cracked feet: moisturize feet once daily; do not apply between toes A. CeraVe Healing Ointment B. Eucerin Aquaphor Repairing Ointment (may be labeled Aquaphor Healing Ointment) C. Vaseline Petroleum Healing Jelly   If you have problems reaching your feet: apply to feet once daily; do not apply between toes A.  Eucerin Aquaphor Ointment Body Spray  B.  Vaseline Intensive Care Spray Moisturizer (Unscented,  Cocoa Radiant Spray or Aloe Smooth Spray)

## 2021-10-23 NOTE — Progress Notes (Signed)
Subjective: Joseph Lamb presents today referred by Lupita Raider, MD for diabetic foot evaluation.  Today, patient c/o for diabetic foot evaluation.  Patient relates 8 year history of diabetes.  Patient denies any history of foot wounds.  Patient denies any symptoms of numbness, tingling, burning or pins/needle sensation in feet.  Patient relates blood glucose was 140 mg/dl this morning.   PCP is Lupita Raider, MD , and last visit was December, 2022.  Past Medical History:  Diagnosis Date   Adverse effect of antihyperlipidemic and antiarteriosclerotic drugs, initial encounter 10/11/2021   Arrhythmia    Atrial fibrillation (HCC)    Cholecystitis 09/01/2021   Chronic kidney disease, stage 3 unspecified (HCC) 10/11/2021   Diabetes mellitus type 2, uncomplicated (HCC) 06/14/2015   Dizzy spells 03/08/2020   Essential hypertension 06/14/2015   Essential tremor 08/31/2021   Glaucoma 08/31/2021   High cholesterol    Hyperlipidemia 06/14/2015   Hypertension    Memory loss 06/14/2015   Mixed hyperlipidemia 10/11/2021   Obesity 10/11/2021   Other male erectile dysfunction 10/11/2021   Pre-diabetes    Primary open angle glaucoma (POAG) of left eye, moderate stage 10/11/2021   Primary open-angle glaucoma, right eye, mild stage 10/11/2021   Sinus bradycardia 03/08/2020   Symptomatic cholelithiasis 08/31/2021   Tremor 10/11/2021   Type 2 diabetes mellitus with other specified complication (HCC) 10/11/2021   Vascular dementia (HCC) 10/11/2021    Patient Active Problem List   Diagnosis Date Noted   Atrial fibrillation (HCC) 10/11/2021   Adverse effect of antihyperlipidemic and antiarteriosclerotic drugs, initial encounter 10/11/2021   Chronic kidney disease, stage 3 unspecified (HCC) 10/11/2021   Obesity 10/11/2021   Other male erectile dysfunction 10/11/2021   Primary open angle glaucoma (POAG) of left eye, moderate stage 10/11/2021   Primary open-angle glaucoma, right eye, mild stage  10/11/2021   Tremor 10/11/2021   Vascular dementia (HCC) 10/11/2021   Type 2 diabetes mellitus with other specified complication (HCC) 10/11/2021   Mixed hyperlipidemia 10/11/2021   Cholecystitis 09/01/2021   Symptomatic cholelithiasis 08/31/2021   Essential tremor 08/31/2021   Glaucoma 08/31/2021   Arrhythmia    High cholesterol    Hypertension    Pre-diabetes    Dizzy spells 03/08/2020   Sinus bradycardia 03/08/2020   Memory loss 06/14/2015   Essential hypertension 06/14/2015   Hyperlipidemia 06/14/2015   Diabetes mellitus type 2, uncomplicated (HCC) 06/14/2015    Past Surgical History:  Procedure Laterality Date   CHOLECYSTECTOMY N/A 08/31/2021   Procedure: LAPAROSCOPIC CHOLECYSTECTOMY;  Surgeon: Quentin Ore, MD;  Location: [facility] OR;  Service: General;  Laterality: N/A;   EYE SURGERY     PROSTATE SURGERY      Current Outpatient Medications on File Prior to Visit  Medication Sig Dispense Refill   amLODipine (NORVASC) 5 MG tablet Take 5 mg by mouth daily.     aspirin EC 325 MG tablet Take 325 mg by mouth every 4 (four) hours as needed for mild pain (headache).     donepezil (ARICEPT) 5 MG tablet Take 5 mg by mouth at bedtime.     hydrochlorothiazide (HYDRODIURIL) 25 MG tablet Take 25 mg by mouth at bedtime.     latanoprost (XALATAN) 0.005 % ophthalmic solution Place 1 drop into both eyes at bedtime.     Multiple Vitamins-Minerals (MULTIVITAMIN MEN 50+) TABS Take 1 tablet by mouth daily.     pravastatin (PRAVACHOL) 40 MG tablet Take 1 tablet (40 mg total) by mouth daily. 90 tablet 3  tadalafil (CIALIS) 20 MG tablet Take 20 mg by mouth daily as needed for erectile dysfunction.     TRULICITY 1.5 MG/0.5ML SOPN Inject 1.5 mg into the skin once a week.  0   No current facility-administered medications on file prior to visit.     Allergies  Allergen Reactions   Lisinopril Cough   Telmisartan     Other reaction(s): decreased kidney functions   Crestor [Rosuvastatin  Calcium]     " fatigue"    Simvastatin Other (See Comments)    "fatigue"    Social History   Occupational History   Occupation: Retired  Tobacco Use   Smoking status: Never   Smokeless tobacco: Never  Vaping Use   Vaping Use: Never used  Substance and Sexual Activity   Alcohol use: Never    Comment: rare   Drug use: No   Sexual activity: Not on file    Family History  Problem Relation Age of Onset   Diabetes Mother    Diabetes Father     There is no immunization history on file for this patient.  Objective: There were no vitals filed for this visit.  Joseph Lamb is a pleasant 71 y.o. male WD, WN in NAD. AAO X 3.  Vascular Examination: CFT immediate b/l LE. Palpable DP/PT pulses b/l LE. Digital hair present b/l. Skin temperature gradient WNL b/l. No pain with calf compression b/l. No edema noted b/l. No cyanosis or clubbing noted b/l LE.  Dermatological Examination: Pedal skin is warm and supple b/l LE. Toenails 1-5 b/l elongated, discolored, dystrophic, thickened, crumbly with subungual debris and tenderness to dorsal palpation. No hyperkeratotic nor porokeratotic lesions present on today's visit. Pedal skin noted to be dry b/l lower extremities.  Musculoskeletal Examination: Normal muscle strength 5/5 to all lower extremity muscle groups bilaterally. HAV with bunion deformity noted b/l LE.Marland Kitchen No pain, crepitus or joint limitation noted with ROM b/l LE.  Patient ambulates independently without assistive aids.  Footwear Assessment: Does the patient wear appropriate shoes? Yes. Does the patient need inserts/orthotics? No.  Neurological Examination: Protective sensation intact 5/5 intact bilaterally with 10g monofilament b/l. Vibratory sensation intact b/l. Proprioception intact bilaterally. Deep tendon reflexes normal b/l.  Clonus negative b/l.  A1c:   Hemoglobin A1C Latest Ref Rng & Units 08/31/2021  HGBA1C 4.8 - 5.6 % 6.4(H)  Some recent data might be hidden    Assessment: 1. Pain due to onychomycosis of toenails of both feet   2. Hallux valgus, acquired, bilateral   3. Xerosis cutis   4. Type 2 diabetes mellitus with other specified complication, without long-term current use of insulin (HCC)   5. Encounter for diabetic foot exam (HCC)     ADA Risk Categorization: Low Risk:  Patient has all of the following: Intact protective sensation No prior foot ulcer  No severe deformity Pedal pulses present  Plan: -Examined patient. -Diabetic foot examination performed today. -Discussed diabetic foot care principles. Literature dispensed on today. -Continue diabetic foot care principles: inspect feet daily, monitor glucose as recommended by PCP and/or Endocrinologist, and follow prescribed diet per PCP, Endocrinologist and/or dietician. -Mycotic toenails 1-5 bilaterally were debrided in length and girth with sterile nail nippers and dremel without incident. -For dry skin, patient was given written list of OTC moisturizers. Patient/POA instructed to apply to foot/feet once daily avoiding application between toes.  -Patient/POA to call should there be question/concern in the interim.  Return in about 3 months (around 01/21/2022).  Freddie Breech, DPM

## 2021-10-28 ENCOUNTER — Encounter: Payer: Self-pay | Admitting: Podiatry

## 2021-11-09 ENCOUNTER — Ambulatory Visit: Payer: Medicare Other | Admitting: Physician Assistant

## 2021-11-09 ENCOUNTER — Encounter: Payer: Self-pay | Admitting: Physician Assistant

## 2021-11-09 ENCOUNTER — Other Ambulatory Visit: Payer: Self-pay

## 2021-11-09 VITALS — BP 135/72 | HR 61 | Resp 18 | Ht 74.0 in | Wt 217.0 lb

## 2021-11-09 DIAGNOSIS — F01A0 Vascular dementia, mild, without behavioral disturbance, psychotic disturbance, mood disturbance, and anxiety: Secondary | ICD-10-CM | POA: Diagnosis not present

## 2021-11-09 DIAGNOSIS — I999 Unspecified disorder of circulatory system: Secondary | ICD-10-CM

## 2021-11-09 HISTORY — DX: Unspecified disorder of circulatory system: I99.9

## 2021-11-09 MED ORDER — DONEPEZIL HCL 5 MG PO TABS: 5.0000 mg | ORAL_TABLET | Freq: Every day | ORAL | 11 refills | Status: DC

## 2021-11-09 NOTE — Patient Instructions (Signed)
Continue Donepezil 5 mg daily  2. Start using a pillbox and have your wife check behind with medications  3. Follow-up in 6 months, call for any changes   FALL PRECAUTIONS: Be cautious when walking. Scan the area for obstacles that may increase the risk of trips and falls. When getting up in the mornings, sit up at the edge of the bed for a few minutes before getting out of bed. Consider elevating the bed at the head end to avoid drop of blood pressure when getting up. Walk always in a well-lit room (use night lights in the walls). Avoid area rugs or power cords from appliances in the middle of the walkways. Use a walker or a cane if necessary and consider physical therapy for balance exercise. Get your eyesight checked regularly.  FINANCIAL OVERSIGHT: Supervision, especially oversight when making financial decisions or transactions is also recommended as difficulties arise.  HOME SAFETY: Consider the safety of the kitchen when operating appliances like stoves, microwave oven, and blender. Consider having supervision and share cooking responsibilities until no longer able to participate in those. Accidents with firearms and other hazards in the house should be identified and addressed as well.  DRIVING: Regarding driving, in patients with progressive memory problems, driving will be impaired. We advise to have someone else do the driving if trouble finding directions or if minor accidents are reported. Independent driving assessment is available to determine safety of driving.  ABILITY TO BE LEFT ALONE: If patient is unable to contact 911 operator, consider using LifeLine, or when the need is there, arrange for someone to stay with patients. Smoking is a fire hazard, consider supervision or cessation. Risk of wandering should be assessed by caregiver and if detected at any point, supervision and safe proof recommendations should be instituted.  MEDICATION SUPERVISION: Inability to self-administer  medication needs to be constantly addressed. Implement a mechanism to ensure safe administration of the medications.  RECOMMENDATIONS FOR ALL PATIENTS WITH MEMORY PROBLEMS: 1. Continue to exercise (Recommend 30 minutes of walking everyday, or 3 hours every week) 2. Increase social interactions - continue going to [place] and enjoy social gatherings with friends and family 3. Eat healthy, avoid fried foods and eat more fruits and vegetables 4. Maintain adequate blood pressure, blood sugar, and blood cholesterol level. Reducing the risk of stroke and cardiovascular disease also helps promoting better memory. 5. Avoid stressful situations. Live a simple life and avoid aggravations. Organize your time and prepare for the next day in anticipation. 6. Sleep well, avoid any interruptions of sleep and avoid any distractions in the bedroom that may interfere with adequate sleep quality 7. Avoid sugar, avoid sweets as there is a strong link between excessive sugar intake, diabetes, and cognitive impairment We discussed the Mediterranean diet, which has been shown to help patients reduce the risk of progressive memory disorders and reduces cardiovascular risk. This includes eating fish, eat fruits and green leafy vegetables, nuts like almonds and hazelnuts, walnuts, and also use olive oil. Avoid fast foods and fried foods as much as possible. Avoid sweets and sugar as sugar use has been linked to worsening of memory function.  There is always a concern of gradual progression of memory problems. If this is the case, then we may need to adjust level of care according to patient needs. Support, both to the patient and caregiver, should then be put into place.       Mediterranean Diet  Why follow it? Research shows Those who follow the  Mediterranean diet have a reduced risk of heart disease  The diet is associated with a reduced incidence of Parkinson's and Alzheimer's diseases People following the diet may  have longer life expectancies and lower rates of chronic diseases  The Dietary Guidelines for Americans recommends the Mediterranean diet as an eating plan to promote health and prevent disease  What Is the Mediterranean Diet?  Healthy eating plan based on typical foods and recipes of Mediterranean-style cooking The diet is primarily a plant based diet; these foods should make up a majority of meals   Starches - Plant based foods should make up a majority of meals - They are an important sources of vitamins, minerals, energy, antioxidants, and fiber - Choose whole grains, foods high in fiber and minimally processed items  - Typical grain sources include wheat, oats, barley, corn, brown rice, bulgar, farro, millet, polenta, couscous  - Various types of beans include chickpeas, lentils, fava beans, black beans, white beans   Fruits  Veggies - Large quantities of antioxidant rich fruits & veggies; 6 or more servings  - Vegetables can be eaten raw or lightly drizzled with oil and cooked  - Vegetables common to the traditional Mediterranean Diet include: artichokes, arugula, beets, broccoli, brussel sprouts, cabbage, carrots, celery, collard greens, cucumbers, eggplant, kale, leeks, lemons, lettuce, mushrooms, okra, onions, peas, peppers, potatoes, pumpkin, radishes, rutabaga, shallots, spinach, sweet potatoes, turnips, zucchini - Fruits common to the Mediterranean Diet include: apples, apricots, avocados, cherries, clementines, dates, figs, grapefruits, grapes, melons, nectarines, oranges, peaches, pears, pomegranates, strawberries, tangerines  Fats - Replace butter and margarine with healthy oils, such as olive oil, canola oil, and tahini  - Limit nuts to no more than a handful a day  - Nuts include walnuts, almonds, pecans, pistachios, pine nuts  - Limit or avoid candied, honey roasted or heavily salted nuts - Olives are central to the [employer] - can be eaten whole or used in a variety of  dishes   Meats Protein - Limiting red meat: no more than a few times a month - When eating red meat: choose lean cuts and keep the portion to the size of deck of cards - Eggs: approx. 0 to 4 times a week  - Fish and lean poultry: at least 2 a week  - Healthy protein sources include, chicken, [place], lean beef, lamb - Increase intake of seafood such as tuna, salmon, trout, mackerel, shrimp, scallops - Avoid or limit high fat processed meats such as sausage and bacon  Dairy - Include moderate amounts of low fat dairy products  - Focus on healthy dairy such as fat free yogurt, skim milk, low or reduced fat cheese - Limit dairy products higher in fat such as whole or 2% milk, cheese, ice cream  Alcohol - Moderate amounts of red wine is ok  - No more than 5 oz daily for women (all ages) and men older than age 48  - No more than 10 oz of wine daily for men younger than 68  Other - Limit sweets and other desserts  - Use herbs and spices instead of salt to flavor foods  - Herbs and spices common to the traditional Mediterranean Diet include: basil, bay leaves, chives, cloves, cumin, fennel, garlic, lavender, marjoram, mint, oregano, parsley, pepper, rosemary, sage, savory, sumac, tarragon, thyme   Its not just a diet, its a lifestyle:  The Mediterranean diet includes lifestyle factors typical of those in the region  Foods, drinks and meals are best  eaten with others and savored Daily physical activity is important for overall good health This could be strenuous exercise like running and aerobics This could also be more leisurely activities such as walking, housework, yard-work, or taking the stairs Moderation is the key; a balanced and healthy diet accommodates most foods and drinks Consider portion sizes and frequency of consumption of certain foods   Meal Ideas & Options:  Breakfast:  Whole wheat toast or whole wheat English muffins with peanut butter & hard boiled egg Steel cut oats topped  with apples & cinnamon and skim milk  Fresh fruit: banana, strawberries, melon, berries, peaches  Smoothies: strawberries, bananas, greek yogurt, peanut butter Low fat greek yogurt with blueberries and granola  Egg white omelet with spinach and mushrooms Breakfast couscous: whole wheat couscous, apricots, skim milk, cranberries  Sandwiches:  Hummus and grilled vegetables (peppers, zucchini, squash) on whole wheat bread   Grilled chicken on whole wheat pita with lettuce, tomatoes, cucumbers or tzatziki  [place] salad on whole wheat bread: tuna salad made with greek yogurt, olives, red peppers, capers, green onions Garlic rosemary lamb pita: lamb sauted with garlic, rosemary, salt & pepper; add lettuce, cucumber, greek yogurt to pita - flavor with lemon juice and black pepper  Seafood:  Mediterranean grilled salmon, seasoned with garlic, basil, parsley, lemon juice and black pepper Shrimp, lemon, and spinach whole-grain pasta salad made with low fat greek yogurt  Seared scallops with lemon orzo  Seared tuna steaks seasoned salt, pepper, coriander topped with tomato mixture of olives, tomatoes, olive oil, minced garlic, parsley, green onions and cappers  Meats:  Herbed greek chicken salad with kalamata olives, cucumber, feta  Red bell peppers stuffed with spinach, bulgur, lean ground beef (or lentils) & topped with feta   Kebabs: skewers of chicken, tomatoes, onions, zucchini, squash  [place] burgers: made with red onions, mint, dill, lemon juice, feta cheese topped with roasted red peppers Vegetarian Cucumber salad: cucumbers, artichoke hearts, celery, red onion, feta cheese, tossed in olive oil & lemon juice  Hummus and whole grain pita points with a greek salad (lettuce, tomato, feta, olives, cucumbers, red onion) Lentil soup with celery, carrots made with vegetable broth, garlic, salt and pepper  Tabouli salad: parsley, bulgur, mint, scallions, cucumbers, tomato, radishes, lemon juice, olive  oil, salt and pepper.

## 2021-11-09 NOTE — Progress Notes (Signed)
Assessment/Plan:     Mild vascular neurocognitive disorder  Overall stable from the cognitive standpoint.  MMSE is 29/30 today.  He is on donepezil 10 mg daily.  His tremors are similar to prior exam.   Recommendations:   Discussed safety both in and out of the home.  Discussed the importance of regular daily schedule with inclusion of crossword puzzles to maintain brain function.  Continue to monitor mood by PCP Stay active at least 30 minutes at least 3 times a week.  Naps should be scheduled and should be no longer than 60 minutes and should not occur after 2 PM.  Mediterranean diet is recommended  Control cardiovascular risk factors  Continue donepezil 10 mg daily Side effects were discussed Continue to monitor his hand Follow up in 6  months.   Case discussed with Dr. Karel Jarvis who agrees with the plan     Subjective:     Joseph Lamb is a very pleasant 71 y.o. RH male with a history of hypertension, hyperlipidemia, diabetes, and a history of mild Vascular neurocognitive disorder diagnosed on neuropsychological evaluation in August 2021.  MRI showed moderate chronic microvascular disease.  He is on donepezil 10 mg daily.  He is accompanied by his wife who supplements the history.  Prior records as well as outside records available were reviewed prior to this visit.  He was last seen at our office on 05/09/2021.  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  He reports that his memory is "pretty good, about the same ".  His wife disagrees, states that he may have some more short-term memory issues, forgetting conversations that took place within minutes  He denies being disoriented when entering the room.  He continues to drive only short distances.  Sometimes when his wife sends him to buy something in the store, he comes back empty handed.  He takes his own medications, he likes to take them from the bottle, and ends up  missing doses.  He admits to being more deconditioned due to lack of activity, he admits to needing to walk more.  He is also complaining about his right hand tremors, which had not resolved.  No foot shuffling is noted they are worse in the right hand, but does not affect writing or holding objects.  Occasionally he wets pillows.  He denies any voice changes, trouble swallowing. He denies any headaches, dizziness, falls or vision changes.  His vision is good.    History on Initial Assessment 04/05/2020: This is a 71 year old right-handed man with a history of hypertension, diabetes, hyperlipidemia, presenting for evaluation of memory changes. He was evaluated in our office in September 2016 for similar concerns, MOCA score at that time was 29/30. He was also evaluated by neurologist Dr. Marjory Lies in 2013 for memory loss that started around 2010 or 2011. MOCA score 28/30 in 2013.  MRI brain in 2012 showed mild diffuse volume loss, mild to moderate chronic microvascular disease, a small area of cortical encephalomalacia in the right perirolandic cortex. He has been taking Donepezil 5mg  daily since 2013, however on his visit with Dr. 2014 last March, he reported he was not taking Donepezil and Sertraline regularly because he did not think he needed it. It was unclear if this was due to stubbornness or cognitive decline, his wife continued to report worsening memory and behavior. He states he has been taking medications regularly since March, he feels his  memory is "fair, not perfect." He remembers "when I want to remember." His wife tells him he forgets to do things. He denies getting lost driving. He manages his own medications and occasionally misses doses when "I get lazy and go to sleep." He misplaces things frequently. If he is not careful, he has burned food. His wife manages finances. He feels more laidback since taking the sertraline regularly. He denies any hallucinations. Sleep is good.    He is  concerned about occasional bilateral hand tremors that started 4-5 months ago. He has noticed a little weakness in his left hand. Tremors are noticeable when he is using a pen or fork. No family history of tremors. He denies any headaches, diplopia, dysarthria/dysphagia, neck/back pain, focal numbness/tingling, bowel/bladder dysfunction, anosmia. No family history of dementia, no history of significant head injuries or alcohol use.     Diagnostic Data: MRI brain with and without contrast done 05/2020 did not show any acute changes. There was moderate chronic microvascular disease, significantly progressed since MRI brain from 2002.    Neuropsychological testing in August 2021 indicated Mild vascular neurocognitive disorder, it was also noted that longstanding differences of opinion/conflict between him and his wife are coloring family report of symptoms.   PREVIOUS MEDICATIONS:   CURRENT MEDICATIONS:  Outpatient Encounter Medications as of 11/09/2021  Medication Sig   amLODipine (NORVASC) 10 MG tablet Take by mouth.   amLODipine (NORVASC) 5 MG tablet Take 5 mg by mouth daily.   aspirin EC 325 MG tablet Take 325 mg by mouth every 4 (four) hours as needed for mild pain (headache).   hydrochlorothiazide (HYDRODIURIL) 25 MG tablet Take 25 mg by mouth at bedtime.   latanoprost (XALATAN) 0.005 % ophthalmic solution Place 1 drop into both eyes at bedtime.   Multiple Vitamins-Minerals (MULTIVITAMIN MEN 50+) TABS Take 1 tablet by mouth daily.   pravastatin (PRAVACHOL) 40 MG tablet Take 1 tablet (40 mg total) by mouth daily.   tadalafil (CIALIS) 20 MG tablet Take 20 mg by mouth daily as needed for erectile dysfunction.   TRULICITY 1.5 MG/0.5ML SOPN Inject 1.5 mg into the skin once a week.   [DISCONTINUED] donepezil (ARICEPT) 5 MG tablet Take 5 mg by mouth at bedtime.   donepezil (ARICEPT) 5 MG tablet Take 1 tablet (5 mg total) by mouth at bedtime.   No facility-administered encounter medications on file  as of 11/09/2021.     Objective:     PHYSICAL EXAMINATION:    VITALS:   Vitals:   11/09/21 0920  BP: 135/72  Pulse: 61  Resp: 18  SpO2: 100%  Weight: 217 lb (98.4 kg)  Height: 6\' 2"  (1.88 m)    GEN:  The patient appears stated age and is in NAD. HEENT:  Normocephalic, atraumatic.   Neurological examination:  General: NAD, well-groomed, appears stated age. Orientation: The patient is alert. Oriented to person, place and date Cranial nerves: There is good facial symmetry.The speech is fluent and clear. No aphasia or dysarthria. Fund of knowledge is appropriate. Recent and remote memory are impaired. Attention and concentration are reduced.  Able to name objects and repeat phrases.  Hearing is intact to conversational tone.    Sensation: Sensation is intact to light touch throughout Motor: Strength is at least antigravity x4. Tremors: Right greater than left hand tremor at rest, not on intention.  No postural or endpoint tremor DTR's 2/4 in UE/LE     Montreal Cognitive Assessment  05/08/2020 06/14/2015  Visuospatial/ Executive (0/5) 4  5  Naming (0/3) 3 3  Attention: Read list of digits (0/2) 2 2  Attention: Read list of letters (0/1) 1 1  Attention: Serial 7 subtraction starting at 100 (0/3) 3 3  Language: Repeat phrase (0/2) 1 2  Language : Fluency (0/1) 0 1  Abstraction (0/2) 2 2  Delayed Recall (0/5) 4 4  Orientation (0/6) 6 6  Total 26 29  Adjusted Score (based on education) 26 29   MMSE - Mini Mental State Exam 11/09/2021  Orientation to time 5  Orientation to Place 5  Registration 3  Attention/ Calculation 4  Recall 3  Language- name 2 objects 2  Language- repeat 1  Language- follow 3 step command 3  Language- read & follow direction 1  Write a sentence 1  Copy design 1  Total score 29    [employer] Mental Exam 04/05/2020  Weekday Correct 1  Current year 1  What state are we in? 1  Amount spent 1  Amount left 2  # of Animals 2  5 objects recall  3  Number series 2  Hour markers 2  Time correct 2  Placed X in triangle correctly 1  Largest Figure 1  Name of male 2  Date back to work 2  Type of work 2  State she lived in 2  Total score 27       Movement examination: Tone: There is normal tone in the UE/LE.  Mild cogwheeling with distraction greater on the right. Abnormal movements:  No myoclonus.  No asterixis.   Coordination:  There is no decremation with RAM's. Normal finger to nose  Gait and Station: The patient has no difficulty arising out of a deep-seated chair without the use of the hands. The patient's stride length is good.  Gait is cautious and narrow.        Total time spent on today's visit was 30  minutes, including both face-to-face time and nonface-to-face time. Time included that spent on review of records (prior notes available to me/labs/imaging if pertinent), discussing treatment and goals, answering patient's questions and coordinating care.  Cc:  Lupita Raider, MD Marlowe Kays, PA-C

## 2022-01-23 ENCOUNTER — Encounter: Payer: Self-pay | Admitting: Podiatry

## 2022-01-23 ENCOUNTER — Ambulatory Visit (INDEPENDENT_AMBULATORY_CARE_PROVIDER_SITE_OTHER): Payer: Medicare Other | Admitting: Podiatry

## 2022-01-23 DIAGNOSIS — E1169 Type 2 diabetes mellitus with other specified complication: Secondary | ICD-10-CM | POA: Diagnosis not present

## 2022-01-23 DIAGNOSIS — B351 Tinea unguium: Secondary | ICD-10-CM | POA: Diagnosis not present

## 2022-01-23 DIAGNOSIS — M79674 Pain in right toe(s): Secondary | ICD-10-CM

## 2022-01-23 DIAGNOSIS — M79675 Pain in left toe(s): Secondary | ICD-10-CM

## 2022-01-23 NOTE — Patient Instructions (Signed)
Shoe and Sneaker Recommendations:  *Purchase sneakers with mesh (soft, stretchable) uppers. Avoid leather sneakers.*  Recommend Skechers Loafers with stretchable uppers and memory foam insoles. They can be purchased at [employer], [employer] or [employer]. Also on www.skechers.com.    2. New Balance Sneakers 600 Series or Higher ([email or web address]). *Purchase sneakers with mesh (soft, stretchable) uppers. Avoid leather sneakers.*  3.  Brooks Beast: *Purchase sneakers with mesh (soft, stretchable) uppers. Avoid leather sneakers.* 

## 2022-02-02 NOTE — Progress Notes (Signed)
?  Subjective:  ?Patient ID: Joseph Lamb, male    DOB: May 22, 1951,  MRN: [id number] ? ?Joseph Lamb presents to clinic today for preventative diabetic foot care and painful elongated mycotic toenails 1-5 bilaterally which are tender when wearing enclosed shoe gear. Pain is relieved with periodic professional debridement. ? ?Last known HgA1c was 6.9%. Patient has not checked blood glucose in a month. ? ?New problem(s): None.  ? ?PCP is Joseph Neer, MD , and last visit was January 17, 2022. ? ?Allergies  ?Allergen Reactions  ? Lisinopril Cough  ? Rosuvastatin   ?  Other reaction(s): fatigue  ? Telmisartan   ?  Other reaction(s): decreased kidney functions  ? Crestor [Rosuvastatin Calcium]   ?  " fatigue"   ? Simvastatin Other (See Comments)  ?  "fatigue"  ? ? ?Review of Systems: Negative except as noted in the HPI. ? ?Objective: No changes noted in today's physical examination. ?There were no vitals filed for this visit. ? ?Joseph Lamb is a pleasant 71 y.o. male WD, WN in NAD. AAO X 3. ? ?Vascular Examination: ?CFT immediate b/l LE. Palpable DP/PT pulses b/l LE. Digital hair present b/l. Skin temperature gradient WNL b/l. No pain with calf compression b/l. No edema noted b/l. No cyanosis or clubbing noted b/l LE. ? ?Dermatological Examination: ?Pedal skin is warm and supple b/l LE. Toenails 1-5 b/l elongated, discolored, dystrophic, thickened, crumbly with subungual debris and tenderness to dorsal palpation. No hyperkeratotic nor porokeratotic lesions present on today's visit. Pedal skin noted to be dry b/l lower extremities. ? ?Musculoskeletal Examination: ?Normal muscle strength 5/5 to all lower extremity muscle groups bilaterally. HAV with bunion deformity noted b/l LE.Marland Kitchen No pain, crepitus or joint limitation noted with ROM b/l LE.  Patient ambulates independently without assistive aids. ? ?Neurological Examination: ?Protective sensation intact 5/5 intact bilaterally with 10g monofilament b/l.  Vibratory sensation intact b/l. Proprioception intact bilaterally. Deep tendon reflexes normal b/l.   ? ? ?  Latest Ref Rng & Units 08/31/2021  ?  5:36 AM  ?Hemoglobin A1C  ?Hemoglobin-A1c 4.8 - 5.6 % 6.4    ? ?Assessment/Plan: ?1. Pain due to onychomycosis of toenails of both feet   ?2. Type 2 diabetes mellitus with other specified complication, without long-term current use of insulin ([place])   ?  ?-Patient was evaluated and treated. All patient's and/or POA's questions/concerns answered on today's visit. ?-Continue foot and shoe inspections daily. Monitor blood glucose per PCP/Endocrinologist's recommendations. ?-Patient to continue soft, supportive shoe gear daily. ?-Toenails 1-5 b/l were debrided in length and girth with sterile nail nippers and dremel without iatrogenic bleeding.  ?-Recommended sneakers New Balance sneakers series 600 or higher, Brooks Beast or Skechers with stretchable uppers and memory foam insoles. ?-Patient/POA to call should there be question/concern in the interim.  ? ?Return in about 3 months (around 04/24/2022). ? ?Marzetta Board, DPM  ?

## 2022-04-19 ENCOUNTER — Other Ambulatory Visit: Payer: Self-pay | Admitting: Cardiology

## 2022-04-19 NOTE — Telephone Encounter (Signed)
Need to verify meds, LM to return my call.

## 2022-04-22 NOTE — Telephone Encounter (Signed)
Rx refill sent to pharmacy. 

## 2022-04-29 ENCOUNTER — Ambulatory Visit (INDEPENDENT_AMBULATORY_CARE_PROVIDER_SITE_OTHER): Payer: Self-pay | Admitting: Podiatry

## 2022-04-29 DIAGNOSIS — Z91199 Patient's noncompliance with other medical treatment and regimen due to unspecified reason: Secondary | ICD-10-CM

## 2022-04-29 NOTE — Progress Notes (Signed)
   Complete physical exam  Patient: Joseph Lamb   DOB: 07/27/2001   71 y.o. Male  MRN: [id number]  Subjective:    No chief complaint on file.   Joseph Lamb is a 71 y.o. male who presents today for a complete physical exam. She reports consuming a {diet types:17450} diet. {types:19826} She generally feels {DESC; WELL/FAIRLY WELL/POORLY:18703}. She reports sleeping {DESC; WELL/FAIRLY WELL/POORLY:18703}. She {does/does not:200015} have additional problems to discuss today.    Most recent fall risk assessment:    04/03/2022   10:42 AM  Fall Risk   Falls in the past year? 0  Number falls in past yr: 0  Injury with Fall? 0  Risk for fall due to : No Fall Risks  Follow up Falls evaluation completed     Most recent depression screenings:    04/03/2022   10:42 AM 02/22/2021   10:46 AM  PHQ 2/9 Scores  PHQ - 2 Score 0 0  PHQ- 9 Score 5     {VISON DENTAL STD PSA (Optional):27386}  {History (Optional):23778}  Patient Care Team: Jessup, Joy, NP as PCP - General (Nurse Practitioner)   Outpatient Medications Prior to Visit  Medication Sig   fluticasone (FLONASE) 50 MCG/ACT nasal spray Place 2 sprays into both nostrils in the morning and at bedtime. After 7 days, reduce to once daily.   norgestimate-ethinyl estradiol (SPRINTEC 28) 0.25-35 MG-MCG tablet Take 1 tablet by mouth daily.   Nystatin POWD Apply liberally to affected area 2 times per day   spironolactone (ALDACTONE) 100 MG tablet Take 1 tablet (100 mg total) by mouth daily.   No facility-administered medications prior to visit.    ROS        Objective:     There were no vitals taken for this visit. {Vitals History (Optional):23777}  Physical Exam   No results found for any visits on 05/09/22. {Show previous labs (optional):23779}    Assessment & Plan:    Routine Health Maintenance and Physical Exam  Immunization History  Administered Date(s) Administered   DTaP 10/10/1999, 12/06/1999,  02/14/2000, 10/30/2000, 05/15/2004   Hepatitis A 03/11/2008, 03/17/2009   Hepatitis B 07/28/1999, 09/04/1999, 02/14/2000   HiB (PRP-OMP) 10/10/1999, 12/06/1999, 02/14/2000, 10/30/2000   IPV 10/10/1999, 12/06/1999, 08/04/2000, 05/15/2004   Influenza,inj,Quad PF,6+ Mos 06/17/2014   Influenza-Unspecified 09/16/2012   MMR 08/04/2001, 05/15/2004   Meningococcal Polysaccharide 03/16/2012   Pneumococcal Conjugate-13 10/30/2000   Pneumococcal-Unspecified 02/14/2000, 04/29/2000   Tdap 03/16/2012   Varicella 08/04/2000, 03/11/2008    Health Maintenance  Topic Date Due   HIV Screening  Never done   Hepatitis C Screening  Never done   INFLUENZA VACCINE  05/07/2022   PAP-Cervical Cytology Screening  05/09/2022 (Originally 07/26/2020)   PAP SMEAR-Modifier  05/09/2022 (Originally 07/26/2020)   TETANUS/TDAP  05/09/2022 (Originally 03/16/2022)   HPV VACCINES  Discontinued   COVID-19 Vaccine  Discontinued    Discussed health benefits of physical activity, and encouraged her to engage in regular exercise appropriate for her age and condition.  Problem List Items Addressed This Visit   None Visit Diagnoses     Annual physical exam    -  Primary   Cervical cancer screening       Need for Tdap vaccination          No follow-ups on file.     Joy Jessup, NP   

## 2022-05-20 ENCOUNTER — Ambulatory Visit: Payer: Medicare Other | Admitting: Neurology

## 2022-05-28 ENCOUNTER — Ambulatory Visit: Payer: Medicare Other | Admitting: Physician Assistant

## 2022-05-30 ENCOUNTER — Encounter: Payer: Self-pay | Admitting: Physician Assistant

## 2022-05-30 ENCOUNTER — Ambulatory Visit: Payer: Medicare Other | Admitting: Physician Assistant

## 2022-05-30 VITALS — BP 138/74 | HR 60 | Ht 73.0 in | Wt 222.0 lb

## 2022-05-30 DIAGNOSIS — I999 Unspecified disorder of circulatory system: Secondary | ICD-10-CM | POA: Diagnosis not present

## 2022-05-30 DIAGNOSIS — F0670 Mild neurocognitive disorder due to known physiological condition without behavioral disturbance: Secondary | ICD-10-CM

## 2022-05-30 DIAGNOSIS — G250 Essential tremor: Secondary | ICD-10-CM

## 2022-05-30 NOTE — Patient Instructions (Signed)
Continue Donepezil  but increase 10 mg daily  2. Maintain good control his cardiovascular risk factors   3. Follow-up in 6 months, call for any changes   FALL PRECAUTIONS: Be cautious when walking. Scan the area for obstacles that may increase the risk of trips and falls. When getting up in the mornings, sit up at the edge of the bed for a few minutes before getting out of bed. Consider elevating the bed at the head end to avoid drop of blood pressure when getting up. Walk always in a well-lit room (use night lights in the walls). Avoid area rugs or power cords from appliances in the middle of the walkways. Use a walker or a cane if necessary and consider physical therapy for balance exercise. Get your eyesight checked regularly.  FINANCIAL OVERSIGHT: Supervision, especially oversight when making financial decisions or transactions is also recommended as difficulties arise.  HOME SAFETY: Consider the safety of the kitchen when operating appliances like stoves, microwave oven, and blender. Consider having supervision and share cooking responsibilities until no longer able to participate in those. Accidents with firearms and other hazards in the house should be identified and addressed as well.  DRIVING: Regarding driving, in patients with progressive memory problems, driving will be impaired. We advise to have someone else do the driving if trouble finding directions or if minor accidents are reported. Independent driving assessment is available to determine safety of driving.  ABILITY TO BE LEFT ALONE: If patient is unable to contact 911 operator, consider using LifeLine, or when the need is there, arrange for someone to stay with patients. Smoking is a fire hazard, consider supervision or cessation. Risk of wandering should be assessed by caregiver and if detected at any point, supervision and safe proof recommendations should be instituted.  MEDICATION SUPERVISION: Inability to self-administer  medication needs to be constantly addressed. Implement a mechanism to ensure safe administration of the medications.  RECOMMENDATIONS FOR ALL PATIENTS WITH MEMORY PROBLEMS: 1. Continue to exercise (Recommend 30 minutes of walking everyday, or 3 hours every week) 2. Increase social interactions - continue going to [place] and enjoy social gatherings with friends and family 3. Eat healthy, avoid fried foods and eat more fruits and vegetables 4. Maintain adequate blood pressure, blood sugar, and blood cholesterol level. Reducing the risk of stroke and cardiovascular disease also helps promoting better memory. 5. Avoid stressful situations. Live a simple life and avoid aggravations. Organize your time and prepare for the next day in anticipation. 6. Sleep well, avoid any interruptions of sleep and avoid any distractions in the bedroom that may interfere with adequate sleep quality 7. Avoid sugar, avoid sweets as there is a strong link between excessive sugar intake, diabetes, and cognitive impairment We discussed the Mediterranean diet, which has been shown to help patients reduce the risk of progressive memory disorders and reduces cardiovascular risk. This includes eating fish, eat fruits and green leafy vegetables, nuts like almonds and hazelnuts, walnuts, and also use olive oil. Avoid fast foods and fried foods as much as possible. Avoid sweets and sugar as sugar use has been linked to worsening of memory function.  There is always a concern of gradual progression of memory problems. If this is the case, then we may need to adjust level of care according to patient needs. Support, both to the patient and caregiver, should then be put into place.       Mediterranean Diet  Why follow it? Research shows. Those who follow the Mediterranean  diet have a reduced risk of heart disease  The diet is associated with a reduced incidence of Parkinson's and Alzheimer's diseases People following the diet may  have longer life expectancies and lower rates of chronic diseases  The Dietary Guidelines for Americans recommends the Mediterranean diet as an eating plan to promote health and prevent disease  What Is the Mediterranean Diet?  Healthy eating plan based on typical foods and recipes of Mediterranean-style cooking The diet is primarily a plant based diet; these foods should make up a majority of meals   Starches - Plant based foods should make up a majority of meals - They are an important sources of vitamins, minerals, energy, antioxidants, and fiber - Choose whole grains, foods high in fiber and minimally processed items  - Typical grain sources include wheat, oats, barley, corn, brown rice, bulgar, farro, millet, polenta, couscous  - Various types of beans include chickpeas, lentils, fava beans, black beans, white beans   Fruits  Veggies - Large quantities of antioxidant rich fruits & veggies; 6 or more servings  - Vegetables can be eaten raw or lightly drizzled with oil and cooked  - Vegetables common to the traditional Mediterranean Diet include: artichokes, arugula, beets, broccoli, brussel sprouts, cabbage, carrots, celery, collard greens, cucumbers, eggplant, kale, leeks, lemons, lettuce, mushrooms, okra, onions, peas, peppers, potatoes, pumpkin, radishes, rutabaga, shallots, spinach, sweet potatoes, turnips, zucchini - Fruits common to the Mediterranean Diet include: apples, apricots, avocados, cherries, clementines, dates, figs, grapefruits, grapes, melons, nectarines, oranges, peaches, pears, pomegranates, strawberries, tangerines  Fats - Replace butter and margarine with healthy oils, such as olive oil, canola oil, and tahini  - Limit nuts to no more than a handful a day  - Nuts include walnuts, almonds, pecans, pistachios, pine nuts  - Limit or avoid candied, honey roasted or heavily salted nuts - Olives are central to the [employer] - can be eaten whole or used in a variety of  dishes   Meats Protein - Limiting red meat: no more than a few times a month - When eating red meat: choose lean cuts and keep the portion to the size of deck of cards - Eggs: approx. 0 to 4 times a week  - Fish and lean poultry: at least 2 a week  - Healthy protein sources include, chicken, [place], lean beef, lamb - Increase intake of seafood such as tuna, salmon, trout, mackerel, shrimp, scallops - Avoid or limit high fat processed meats such as sausage and bacon  Dairy - Include moderate amounts of low fat dairy products  - Focus on healthy dairy such as fat free yogurt, skim milk, low or reduced fat cheese - Limit dairy products higher in fat such as whole or 2% milk, cheese, ice cream  Alcohol - Moderate amounts of red wine is ok  - No more than 5 oz daily for women (all ages) and men older than age 39  - No more than 10 oz of wine daily for men younger than 27  Other - Limit sweets and other desserts  - Use herbs and spices instead of salt to flavor foods  - Herbs and spices common to the traditional Mediterranean Diet include: basil, bay leaves, chives, cloves, cumin, fennel, garlic, lavender, marjoram, mint, oregano, parsley, pepper, rosemary, sage, savory, sumac, tarragon, thyme   It's not just a diet, it's a lifestyle:  The Mediterranean diet includes lifestyle factors typical of those in the region  Foods, drinks and meals are best eaten  with others and savored Daily physical activity is important for overall good health This could be strenuous exercise like running and aerobics This could also be more leisurely activities such as walking, housework, yard-work, or taking the stairs Moderation is the key; a balanced and healthy diet accommodates most foods and drinks Consider portion sizes and frequency of consumption of certain foods   Meal Ideas & Options:  Breakfast:  Whole wheat toast or whole wheat English muffins with peanut butter & hard boiled egg Steel cut oats topped  with apples & cinnamon and skim milk  Fresh fruit: banana, strawberries, melon, berries, peaches  Smoothies: strawberries, bananas, greek yogurt, peanut butter Low fat greek yogurt with blueberries and granola  Egg white omelet with spinach and mushrooms Breakfast couscous: whole wheat couscous, apricots, skim milk, cranberries  Sandwiches:  Hummus and grilled vegetables (peppers, zucchini, squash) on whole wheat bread   Grilled chicken on whole wheat pita with lettuce, tomatoes, cucumbers or tzatziki  [place] salad on whole wheat bread: tuna salad made with greek yogurt, olives, red peppers, capers, green onions Garlic rosemary lamb pita: lamb sauted with garlic, rosemary, salt & pepper; add lettuce, cucumber, greek yogurt to pita - flavor with lemon juice and black pepper  Seafood:  Mediterranean grilled salmon, seasoned with garlic, basil, parsley, lemon juice and black pepper Shrimp, lemon, and spinach whole-grain pasta salad made with low fat greek yogurt  Seared scallops with lemon orzo  Seared tuna steaks seasoned salt, pepper, coriander topped with tomato mixture of olives, tomatoes, olive oil, minced garlic, parsley, green onions and cappers  Meats:  Herbed greek chicken salad with kalamata olives, cucumber, feta  Red bell peppers stuffed with spinach, bulgur, lean ground beef (or lentils) & topped with feta   Kebabs: skewers of chicken, tomatoes, onions, zucchini, squash  [place] burgers: made with red onions, mint, dill, lemon juice, feta cheese topped with roasted red peppers Vegetarian Cucumber salad: cucumbers, artichoke hearts, celery, red onion, feta cheese, tossed in olive oil & lemon juice  Hummus and whole grain pita points with a greek salad (lettuce, tomato, feta, olives, cucumbers, red onion) Lentil soup with celery, carrots made with vegetable broth, garlic, salt and pepper  Tabouli salad: parsley, bulgur, mint, scallions, cucumbers, tomato, radishes, lemon juice, olive  oil, salt and pepper.

## 2022-05-30 NOTE — Progress Notes (Signed)
Assessment/Plan:   Mild vascular neurocognitive disorder Essential tremor Joseph Lamb is a very pleasant 71 y.o. RH male with a history of hypertension, hyperlipidemia, diabetes, and a history of mild vascular neurocognitive disorder diagnosed by Neuropsych evaluation in August 2021.  MRI at the time showed moderate chronic microvascular disease presenting in follow-up for evaluation of memory loss.  Patient is on donepezil  5 mg daily. Last MMSE was 29/30.  There may be some subtle cognitive changes.  His tremors are similar to prior exam without any other parkinsonian signs.   Recommendations:   Follow up in 6  months. Increase donepezil to 10 mg daily if tolerated.  Side effects discussed. Recommend good control of cardiovascular risk factors. No indication for treatment medication at this time.  No other parkinsonian signs seen on exam   Case discussed with Dr. Karel Jarvis who agrees with the plan     Subjective:   This patient is accompanied in the office by his wife who supplements the history. Previous records as well as any outside records available were reviewed prior to todays visit.  He is currently on donepezil 5 mg daily.   Any changes in memory since last visit? " A little more forgetful"-his wife reports.  She is bothered by him not closing the garbage cans or the drawers.  He is actual memory is about the same, short-term memory worse than wife said that never closes the garbage can or drawers.  repeats oneself?  Endorsed by his wife Patient lives with: Wife  Disoriented when walking into a room?  Patient denies   Leaving objects in unusual places?  1 time he left salt in the fridge.  Most of the times, he is staff outside, such as glass on the floor which angers his wife Ambulates  with difficulty?   Patient denies   Recent falls?  Patient denies   Any head injuries?  Patient denies   History of seizures?   Patient denies   Wandering behavior?  Patient denies    Patient drives?   P only short distances. Any mood changes since last visit?  Patient denies but wife says gets mad easier Cousins in the setting in between the 2 of them. Any worsening depression?:  Patient denies   Hallucinations?  Patient denies   Paranoia? Endorsed.  He is frequently worried that someone is going to break into the house.  This is a car passing by he may want to know who is in the car as they leaving an isolated area Patient reports that he sleeps well without vivid dreams, REM behavior or sleepwalking   History of sleep apnea?  Endorsed . Stopped using the CPAP Any hygiene concerns?  Patient denies   Independent of bathing and dressing?  Endorsed  Does the patient needs help with medications?  Denies Who is in charge of the finances? Wife has always been in charge    Any changes in appetite?  Patient denies   Patient have trouble swallowing? Patient denies   Does the patient cook?  Patient denies   Any kitchen accidents such as leaving the stove on? Endorsed.  Sometimes he leaves the stove on when doing this gets. Any headaches?  Patient denies   Double vision? Patient denies   Any focal numbness or tingling?  Patient denies   Chronic back pain Patient denies   Unilateral weakness?  Patient denies   Any tremors?  Endorsed R>L, worse on intention.  He denies any profuse  drooling, trouble swallowing, shuffling of feet, BB dreams, low blood pressure, no confusion.  Intermittently, he may experience some trouble tying his shoes.   Any history of anosmia?  Patient denies   Any incontinence of urine?  Patient denies   Any bowel dysfunction?  Denies frank diarrhea, but he may have occasionally lose stools. No constipation   History on Initial Assessment 04/05/2020: This is a 71 year old right-handed man with a history of hypertension, diabetes, hyperlipidemia, presenting for evaluation of memory changes. He was evaluated in our office in September 2016 for similar concerns,  MOCA score at that time was 29/30. He was also evaluated by neurologist Dr. Marjory Lies in 2013 for memory loss that started around 2010 or 2011. MOCA score 28/30 in 2013.  MRI brain in 2012 showed mild diffuse volume loss, mild to moderate chronic microvascular disease, a small area of cortical encephalomalacia in the right perirolandic cortex. He has been taking Donepezil 5mg  daily since 2013, however on his visit with Dr. 2014 last March, he reported he was not taking Donepezil and Sertraline regularly because he did not think he needed it. It was unclear if this was due to stubbornness or cognitive decline, his wife continued to report worsening memory and behavior. He states he has been taking medications regularly since March, he feels his memory is "fair, not perfect." He remembers "when I want to remember." His wife tells him he forgets to do things. He denies getting lost driving. He manages his own medications and occasionally misses doses when "I get lazy and go to sleep." He misplaces things frequently. If he is not careful, he has burned food. His wife manages finances. He feels more laid back since taking the sertraline regularly. He denies any hallucinations. Sleep is good.    He is concerned about occasional bilateral hand tremors that started 4-5 months ago. He has noticed a little weakness in his left hand. Tremors are noticeable when he is using a pen or fork. No family history of tremors. He denies any headaches, diplopia, dysarthria/dysphagia, neck/back pain, focal numbness/tingling, bowel/bladder dysfunction, anosmia. No family history of dementia, no history of significant head injuries or alcohol use.     Diagnostic Data: MRI brain with and without contrast done 05/2020 did not show any acute changes. There was moderate chronic microvascular disease, significantly progressed since MRI brain from 2002.    Neuropsychological testing in August 2021 indicated Mild vascular neurocognitive  disorder, it was also noted that longstanding differences of opinion/conflict between him and his wife are coloring family report of symptoms.   Past Medical History:  Diagnosis Date   Adverse effect of antihyperlipidemic and antiarteriosclerotic drugs, initial encounter 10/11/2021   Arrhythmia    Atrial fibrillation (HCC)    Cholecystitis 09/01/2021   Chronic kidney disease, stage 3 unspecified (HCC) 10/11/2021   Diabetes mellitus type 2, uncomplicated (HCC) 06/14/2015   Dizzy spells 03/08/2020   Essential hypertension 06/14/2015   Essential tremor 08/31/2021   Glaucoma 08/31/2021   High cholesterol    Hyperlipidemia 06/14/2015   Hypertension    Memory loss 06/14/2015   Mixed hyperlipidemia 10/11/2021   Obesity 10/11/2021   Other male erectile dysfunction 10/11/2021   Pre-diabetes    Primary open angle glaucoma (POAG) of left eye, moderate stage 10/11/2021   Primary open-angle glaucoma, right eye, mild stage 10/11/2021   Sinus bradycardia 03/08/2020   Symptomatic cholelithiasis 08/31/2021   Tremor 10/11/2021   Type 2 diabetes mellitus with other specified complication (HCC) 10/11/2021  Vascular dementia (HCC) 10/11/2021     Past Surgical History:  Procedure Laterality Date   CHOLECYSTECTOMY N/A 08/31/2021   Procedure: LAPAROSCOPIC CHOLECYSTECTOMY;  Surgeon: Quentin Ore, MD;  Location: [facility] OR;  Service: General;  Laterality: N/A;   EYE SURGERY     GALLBLADDER SURGERY     PROSTATE SURGERY       PREVIOUS MEDICATIONS:   CURRENT MEDICATIONS:  Outpatient Encounter Medications as of 05/30/2022  Medication Sig   amLODipine (NORVASC) 5 MG tablet TAKE 1 TABLET BY MOUTH EVERY DAY   donepezil (ARICEPT) 5 MG tablet Take 1 tablet (5 mg total) by mouth at bedtime.   hydrochlorothiazide (HYDRODIURIL) 25 MG tablet Take 25 mg by mouth at bedtime.   latanoprost (XALATAN) 0.005 % ophthalmic solution Place 1 drop into both eyes at bedtime.   Multiple Vitamins-Minerals (MULTIVITAMIN MEN 50+) TABS  Take 1 tablet by mouth daily.   pravastatin (PRAVACHOL) 40 MG tablet Take 1 tablet (40 mg total) by mouth daily. (Patient taking differently: Take 20 mg by mouth daily.)   tadalafil (CIALIS) 20 MG tablet Take 20 mg by mouth daily as needed for erectile dysfunction.   TRULICITY 1.5 MG/0.5ML SOPN Inject 1.5 mg into the skin once a week.   aspirin EC 325 MG tablet Take 325 mg by mouth every 4 (four) hours as needed for mild pain (headache). (Patient not taking: Reported on 05/30/2022)   No facility-administered encounter medications on file as of 05/30/2022.     Objective:     PHYSICAL EXAMINATION:    VITALS:   Vitals:   05/30/22 1050  BP: 138/74  Pulse: 60  SpO2: 97%  Weight: 222 lb (100.7 kg)  Height: 6\' 1"  (1.854 m)    GEN:  The patient appears stated age and is in NAD. HEENT:  Normocephalic, atraumatic.   Neurological examination:  General: NAD, well-groomed, appears stated age.  No hypomimia Orientation: The patient is alert. Oriented to person, place and date Cranial nerves: There is good facial symmetry.The speech is fluent and clear. No aphasia or dysarthria. Fund of knowledge is appropriate. Recent memory impaired and remote memory is normal.  Attention and concentration are normal.  Able to name objects and repeat phrases.  Hearing is intact to conversational tone.    Sensation: Sensation is intact to light touch throughout Motor: Strength is at least antigravity x4. Tremors: Right greater than left tremor on intention only with the eyes open.  When I have closed, he does not show any abnormalities with tremors.  No postural tremor. DTR's 2/4 in UE/LE      05/08/2020    9:00 AM 06/14/2015    1:15 PM  Montreal Cognitive Assessment   Visuospatial/ Executive (0/5) 4 5  Naming (0/3) 3 3  Attention: Read list of digits (0/2) 2 2  Attention: Read list of letters (0/1) 1 1  Attention: Serial 7 subtraction starting at 100 (0/3) 3 3  Language: Repeat phrase (0/2) 1 2   Language : Fluency (0/1) 0 1  Abstraction (0/2) 2 2  Delayed Recall (0/5) 4 4  Orientation (0/6) 6 6  Total 26 29  Adjusted Score (based on education) 26 29       11/09/2021    1:00 PM  MMSE - Mini Mental State Exam  Orientation to time 5  Orientation to Place 5  Registration 3  Attention/ Calculation 4  Recall 3  Language- name 2 objects 2  Language- repeat 1  Language- follow 3 step command  3  Language- read & follow direction 1  Write a sentence 1  Copy design 1  Total score 29       Movement examination: Tone: There is normal tone in the UE/LE.  Mild cogwheeling with distraction, greater on the right. Abnormal movements: No other abnormal movements.  No myoclonus.  No asterixis.   Coordination:  There is no decremation with RAM's. Normal finger to nose  Gait and Station: The patient has no difficulty arising out of a deep-seated chair without the use of the hands. The patient's stride length is good.  Gait is cautious and narrow.   Thank you for allowing [place] the opportunity to participate in the care of this nice patient. Please do not hesitate to contact us for any questions or concerns.   Total time spent on today's visit was 31 minutes dedicated to this patient today, preparing to see patient, examining the patient, ordering tests and/or medications and counseling the patient, documenting clinical information in the EHR or other health record, independently interpreting results and communicating results to the patient/family, discussing treatment and goals, answering patient's questions and coordinating care.  Cc:  Lupita Raider, MD  Marlowe Kays 05/30/2022 12:44 PM

## 2022-07-12 ENCOUNTER — Ambulatory Visit (INDEPENDENT_AMBULATORY_CARE_PROVIDER_SITE_OTHER): Payer: Medicare Other | Admitting: Podiatry

## 2022-07-12 ENCOUNTER — Encounter: Payer: Self-pay | Admitting: Podiatry

## 2022-07-12 DIAGNOSIS — E1169 Type 2 diabetes mellitus with other specified complication: Secondary | ICD-10-CM

## 2022-07-12 DIAGNOSIS — M79674 Pain in right toe(s): Secondary | ICD-10-CM | POA: Diagnosis not present

## 2022-07-12 DIAGNOSIS — B351 Tinea unguium: Secondary | ICD-10-CM | POA: Diagnosis not present

## 2022-07-12 DIAGNOSIS — M79675 Pain in left toe(s): Secondary | ICD-10-CM

## 2022-07-16 NOTE — Progress Notes (Signed)
  Subjective:  Patient ID: Joseph Lamb, male    DOB: [birth year],  MRN: [id number]  Joseph Lamb presents to clinic today for:  Chief Complaint  Patient presents with   Nail Problem    Diabetic foot care BS- Did not check today A1C-7.1 PCP-Joseph Lamb PCP VST-02/2022   New problem(s): None.   He is requesting the list of shoe recommendations again.  Allergies  Allergen Reactions   Lisinopril Cough   Rosuvastatin     Other reaction(s): fatigue   Telmisartan     Other reaction(s): decreased kidney functions   Crestor [Rosuvastatin Calcium]     " fatigue"    Simvastatin Other (See Comments)    "fatigue"    Review of Systems: Negative except as noted in the HPI.  Objective: No changes noted in today's physical examination.  Joseph Lamb is a pleasant 71 y.o. male in NAD. AAO x 3.  Vascular Examination: CFT immediate b/l LE. Palpable DP/PT pulses b/l LE. Digital hair present b/l. Skin temperature gradient WNL b/l. No pain with calf compression b/l. No edema noted b/l. No cyanosis or clubbing noted b/l LE.  Dermatological Examination: Pedal skin is warm and supple b/l LE. Toenails 1-5 b/l elongated, discolored, dystrophic, thickened, crumbly with subungual debris and tenderness to dorsal palpation. No hyperkeratotic nor porokeratotic lesions present on today's visit. Pedal skin noted to be dry b/l lower extremities.  Musculoskeletal Examination: Normal muscle strength 5/5 to all lower extremity muscle groups bilaterally. HAV with bunion deformity noted b/l LE.Marland Kitchen No pain, crepitus or joint limitation noted with ROM b/l LE.  Patient ambulates independently without assistive aids.  Neurological Examination: Protective sensation intact 5/5 intact bilaterally with 10g monofilament b/l. Vibratory sensation intact b/l. Proprioception intact bilaterally. Deep tendon reflexes normal b/l.    Assessment/Plan: 1. Pain due to onychomycosis of toenails of both feet   2.  Type 2 diabetes mellitus with other specified complication, without long-term current use of insulin (HCC)     No orders of the defined types were placed in this encounter.   -Consent given for treatment as described below: -Examined patient. -Patient to continue soft, supportive shoe gear daily. -Mycotic toenails 1-5 bilaterally were debrided in length and girth with sterile nail nippers and dremel without incident. -Dispensed list of shoe recommendations. -Patient/POA to call should there be question/concern in the interim.   Return in about 3 months (around 10/12/2022).  Joseph Lamb, DPM

## 2022-08-07 ENCOUNTER — Ambulatory Visit: Payer: Medicare Other | Admitting: Cardiology

## 2022-08-15 ENCOUNTER — Other Ambulatory Visit (HOSPITAL_COMMUNITY): Payer: Self-pay

## 2022-09-03 ENCOUNTER — Ambulatory Visit
Admission: EM | Admit: 2022-09-03 | Discharge: 2022-09-03 | Disposition: A | Discharge status: Home / Self Care | Payer: Medicare Other

## 2022-09-03 DIAGNOSIS — S0501XA Injury of conjunctiva and corneal abrasion without foreign body, right eye, initial encounter: Secondary | ICD-10-CM

## 2022-09-03 MED ORDER — ERYTHROMYCIN 5 MG/GM OP OINT: TOPICAL_OINTMENT | OPHTHALMIC | 0 refills | Status: DC

## 2022-09-03 NOTE — Discharge Instructions (Addendum)
You have a corneal abrasion which is a scratch of your eye.  I have prescribed an antibiotic ointment to help treat this and soothe this.  Please follow-up with your eye doctor if symptoms persist or worsen.

## 2022-09-03 NOTE — ED Provider Notes (Signed)
Joseph Lamb    CSN: [id number] Arrival date & time: 09/03/22  1550      History   Chief Complaint Chief Complaint  Patient presents with   Eye Pain    Right     HPI Joseph Lamb is a 71 y.o. male.   Patient presents with family member who helps provide history.  Patient presents with right eye irritation and pain.  He reports that he accidentally poked his finger in his eye yesterday during in his sleep.  He denies any obvious drainage.  Denies that he wear glasses or contacts.  Denies any changes in his vision.  He does report history of glaucoma.  Bradycardia noted on initial triage.  He reports that his normal heart rate ranges from 40 to 60s.  He states that he is followed by cardiology for this and has been told this is normal.   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   Mixed hyperlipidemia 10/11/2021   Obesity 10/11/2021   Other male erectile dysfunction 10/11/2021   Pre-diabetes    Primary open angle glaucoma (POAG) of left eye, moderate stage 10/11/2021   Primary open-angle glaucoma, right eye, mild stage 10/11/2021   Sinus bradycardia 03/08/2020   Symptomatic cholelithiasis 08/31/2021   Tremor 10/11/2021   Type 2 diabetes mellitus with other specified complication (HCC) 10/11/2021   Vascular dementia (HCC) 10/11/2021    Patient Active Problem List   Diagnosis Date Noted   Mild vascular neurocognitive  disorder 11/09/2021   Atrial fibrillation (HCC) 10/11/2021   Adverse effect of antihyperlipidemic and antiarteriosclerotic drugs, initial encounter 10/11/2021   Chronic kidney disease, stage 3 unspecified (HCC) 10/11/2021   Obesity 10/11/2021   Other male erectile dysfunction 10/11/2021   Primary open angle glaucoma (POAG) of left eye, moderate stage 10/11/2021   Primary open-angle glaucoma, right eye, mild stage 10/11/2021   Tremor 10/11/2021   Vascular dementia (HCC) 10/11/2021   Type 2 diabetes mellitus with other specified complication (HCC) 10/11/2021   Mixed hyperlipidemia 10/11/2021   Cholecystitis 09/01/2021   Symptomatic cholelithiasis 08/31/2021   Essential tremor 08/31/2021   Glaucoma 08/31/2021   Arrhythmia    High cholesterol    Hypertension    Pre-diabetes    Dizzy spells 03/08/2020   Sinus bradycardia 03/08/2020   Memory loss 06/14/2015   Essential hypertension 06/14/2015   Hyperlipidemia 06/14/2015   Diabetes mellitus type 2, uncomplicated (HCC) 06/14/2015    Past Surgical History:  Procedure Laterality Date   CHOLECYSTECTOMY N/A 08/31/2021   Procedure: LAPAROSCOPIC CHOLECYSTECTOMY;  Surgeon: Quentin Ore, MD;  Location: [facility];  Service: General;  Laterality: N/A;   EYE SURGERY     GALLBLADDER SURGERY     PROSTATE SURGERY         Home Medications    Prior to Admission medications   Medication Sig Start Date End Date Taking? Authorizing Provider  erythromycin ophthalmic ointment Place a 1/2 inch ribbon of ointment into  the lower eyelid 4 times daily. 09/03/22  Yes , Acie Fredrickson, FNP  propranolol (INDERAL) 20 MG tablet 10 mg. 08/20/22  Yes [provider]  amLODipine (NORVASC) 5 MG tablet TAKE 1 TABLET BY MOUTH EVERY DAY 04/22/22   Revankar, Aundra Dubin, MD  aspirin EC 325 MG tablet Take 325 mg by mouth every 4 (four) hours as needed for mild pain (headache). Patient not taking: Reported on 05/30/2022    [provider]  donepezil  (ARICEPT) 5 MG tablet Take 1 tablet (5 mg total) by mouth at bedtime. 11/09/21   Marcos Eke, PA-C  hydrochlorothiazide (HYDRODIURIL) 25 MG tablet Take 25 mg by mouth at bedtime. 07/23/21   [provider]  latanoprost (XALATAN) 0.005 % ophthalmic solution Place 1 drop into both eyes at bedtime. 02/18/20   [provider]  Multiple Vitamins-Minerals (MULTIVITAMIN MEN 50+) TABS Take 1 tablet by mouth daily.    [provider]  pravastatin (PRAVACHOL) 40 MG tablet Take 1 tablet (40 mg total) by mouth daily. Patient taking differently: Take 20 mg by mouth daily. 10/18/21   Revankar, Aundra Dubin, MD  tadalafil (CIALIS) 20 MG tablet Take 20 mg by mouth daily as needed for erectile dysfunction. 02/01/20   [provider]  TRULICITY 1.5 MG/0.5ML SOPN Inject 1.5 mg into the skin once a week. 06/02/17   [provider]    Family History Family History  Problem Relation Age of Onset   Diabetes Mother    Diabetes Father     Social History Social History   Tobacco Use   Smoking status: Never   Smokeless tobacco: Never  Vaping Use   Vaping Use: Never used  Substance Use Topics   Alcohol use: Never    Comment: rare   Drug use: No     Allergies   Lisinopril, Rosuvastatin, Telmisartan, Crestor [rosuvastatin calcium], and Simvastatin   Review of Systems Review of Systems Per HPI  Physical Exam Triage Vital Signs ED Triage Vitals  Enc Vitals Group     BP 09/03/22 1901 (!) 170/93     Pulse Rate 09/03/22 1901 (S) (!) 45     Resp 09/03/22 1901 16     Temp 09/03/22 1901 97.9 F (36.6 C)     Temp src --      SpO2 09/03/22 1901 96 %     Weight --      Height --      Head Circumference --      Peak Flow --      Pain Score 09/03/22 1859 0     Pain Loc --      Pain Edu? --      Excl. in GC? --    No data found.  Updated Vital Signs BP 132/79   Pulse (S) (!) 45 Comment: pt reports normal bp  Temp 97.9 F (36.6 C)   Resp 16   SpO2 96%    Visual Acuity Right Eye Distance: 20/30 Left Eye Distance: 20/50 Bilateral Distance: 20/30  Right Eye Near:   Left Eye Near:    Bilateral Near:     Physical Exam Constitutional:      General: He is not in acute distress.    Appearance: Normal appearance. He is not toxic-appearing or diaphoretic.  HENT:     Head: Normocephalic and atraumatic.  Eyes:     General: Lids are normal. Lids are everted, no foreign bodies appreciated. Vision grossly intact. Gaze aligned appropriately.  Extraocular Movements: Extraocular movements intact.     Conjunctiva/sclera: Conjunctivae normal.     Pupils: Pupils are equal, round, and reactive to light.     Right eye: Corneal abrasion and fluorescein uptake present.      Comments: Fluorescein reuptake noted due to linear corneal abrasion present to left inner corner of right eye.  No drainage noted.  Mild erythema surrounding.  Pulmonary:     Effort: Pulmonary effort is normal.  Neurological:     General: No focal deficit present.     Mental Status: He is alert and oriented to person, place, and time. Mental status is at baseline.  Psychiatric:        Mood and Affect: Mood normal.        Behavior: Behavior normal.        Thought Content: Thought content normal.        Judgment: Judgment normal.      UC Treatments / Results  Labs (all labs ordered are listed, but only abnormal results are displayed) Labs Reviewed - No data to display  EKG   Radiology No results found.  Procedures Procedures (including critical Lamb time)  Medications Ordered in UC Medications - No data to display  Initial Impression / Assessment and Plan / UC Course  I have reviewed the triage vital signs and the nursing notes.  Pertinent labs & imaging results that were available during my Lamb of the patient were reviewed by me and considered in my medical decision making (see chart for details).     Patient has abrasion of right eye on exam.  Will  treat with erythromycin ointment.  Visual acuity appears unchanged.  Patient advised to follow-up with established ophthalmology if symptoms persist or worsen.  Recheck of blood pressure was normal.  Patient has bradycardia but reports this is baseline. After further review of patient's chart, this does appear baseline.  Patient verbalized understanding and was agreeable with plan. Final Clinical Impressions(s) / UC Diagnoses   Final diagnoses:  Abrasion of right cornea, initial encounter     Discharge Instructions      You have a corneal abrasion which is a scratch of your eye.  I have prescribed an antibiotic ointment to help treat this and soothe this.  Please follow-up with your eye doctor if symptoms persist or worsen.    ED Prescriptions     Medication Sig Dispense Auth. Provider   erythromycin ophthalmic ointment Place a 1/2 inch ribbon of ointment into the lower eyelid 4 times daily. 3.5 g Gustavus Bryant, [place]      PDMP not reviewed this encounter.   Gustavus Bryant, [place] 09/03/22 2026

## 2022-09-03 NOTE — ED Triage Notes (Signed)
[facility] with co of right eye pain and swelling. Pt reports accidentally poking his eye 2 days ago. Pt has been using eye drops and glaucoma medications.

## 2022-09-04 ENCOUNTER — Other Ambulatory Visit: Payer: Self-pay

## 2022-09-06 ENCOUNTER — Encounter: Payer: Self-pay | Admitting: Cardiology

## 2022-09-06 ENCOUNTER — Ambulatory Visit: Payer: Medicare Other | Attending: Cardiology | Admitting: Cardiology

## 2022-09-06 VITALS — BP 102/60 | HR 54 | Ht 73.0 in | Wt 222.0 lb

## 2022-09-06 DIAGNOSIS — N289 Disorder of kidney and ureter, unspecified: Secondary | ICD-10-CM

## 2022-09-06 DIAGNOSIS — E782 Mixed hyperlipidemia: Secondary | ICD-10-CM | POA: Diagnosis not present

## 2022-09-06 DIAGNOSIS — R001 Bradycardia, unspecified: Secondary | ICD-10-CM | POA: Diagnosis not present

## 2022-09-06 DIAGNOSIS — I10 Essential (primary) hypertension: Secondary | ICD-10-CM

## 2022-09-06 HISTORY — DX: Disorder of kidney and ureter, unspecified: N28.9

## 2022-09-06 NOTE — Progress Notes (Signed)
Cardiology Office Note:    Date:  09/06/2022   ID:  CLEAVE TERNES, DOB 09/12/1951, MRN [id number]  PCP:  Lupita Raider, MD  Cardiologist:  Garwin Brothers, MD   Referring MD: Lupita Raider, MD    ASSESSMENT:    1. Essential hypertension   2. Mixed hyperlipidemia   3. Renal insufficiency   4. Sinus bradycardia    PLAN:    In order of problems listed above:  Primary prevention stressed with the patient.  Importance of compliance with diet medication stressed any vocalized understanding with Essential hypertension: Blood pressure stable and diet was emphasized.  Family member mentions to me that blood pressures generally higher than the 1 documented today.  I told him if it consistently remains low let us know we can adjust medications. Mixed dyslipidemia: On lipid-lowering medications followed by primary care. Renal insufficiency: I reviewed labs from primary care and discussed with the patient.  Precautions urged. Sinus bradycardia: On EKG.  Stable and asymptomatic. Patient will be seen in follow-up appointment in 12 months or earlier if the patient has any concerns    Medication Adjustments/Labs and Tests Ordered: Current medicines are reviewed at length with the patient today.  Concerns regarding medicines are outlined above.  No orders of the defined types were placed in this encounter.  No orders of the defined types were placed in this encounter.    No chief complaint on file.    History of Present Illness:    Joseph Lamb is a 71 y.o. male.  Patient has past medical history of sinus bradycardia, essential hypertension, mixed dyslipidemia, renal insufficiency and sinus bradycardia.  He has history of dementia with tremor.  He denies any problems at this time and takes care of activities of daily living.  He is family is very supportive.  He appears completely stable with his gait.  At the time of my evaluation, the patient is alert awake oriented and in  no distress. 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   Mixed hyperlipidemia 10/11/2021   Obesity 10/11/2021   Other male erectile dysfunction 10/11/2021   Pre-diabetes    Primary open angle glaucoma (POAG) of left eye, moderate stage 10/11/2021   Primary open-angle glaucoma, right eye, mild stage 10/11/2021   Sinus bradycardia 03/08/2020   Symptomatic cholelithiasis 08/31/2021   Tremor 10/11/2021   Type 2 diabetes mellitus with other specified complication (HCC) 10/11/2021   Vascular dementia (HCC) 10/11/2021    Past Surgical History:  Procedure Laterality Date   CHOLECYSTECTOMY N/A 08/31/2021   Procedure: LAPAROSCOPIC CHOLECYSTECTOMY;  Surgeon: Quentin Ore, MD;  Location: [facility];  Service: General;  Laterality: N/A;   EYE SURGERY     GALLBLADDER SURGERY     PROSTATE SURGERY      Current Medications: Current Meds  Medication Sig   amLODipine (NORVASC) 5 MG tablet TAKE 1 TABLET BY MOUTH EVERY DAY   donepezil (ARICEPT) 5 MG tablet Take 1 tablet (5 mg total)  by mouth at bedtime.   erythromycin ophthalmic ointment Place a 1/2 inch ribbon of ointment into the lower eyelid 4 times daily.   hydrochlorothiazide (HYDRODIURIL) 25 MG tablet Take 25 mg by mouth at bedtime.   latanoprost (XALATAN) 0.005 % ophthalmic solution Place 1 drop into both eyes at bedtime.   Multiple Vitamins-Minerals (MULTIVITAMIN MEN 50+) TABS Take 1 tablet by mouth daily.    pravastatin (PRAVACHOL) 40 MG tablet Take 1 tablet (40 mg total) by mouth daily.   propranolol (INDERAL) 20 MG tablet Take 20 mg by mouth daily. Take 1-2 tablets daily as needed for trimmers   Semaglutide,0.25 or 0.5MG /DOS, (OZEMPIC, 0.25 OR 0.5 MG/DOSE,) 2 MG/3ML SOPN Inject 0.25 mg into the skin once a week.   tadalafil (CIALIS) 20 MG tablet Take 20 mg by mouth daily as needed for erectile dysfunction.     Allergies:   Lisinopril, Rosuvastatin, Telmisartan, Crestor [rosuvastatin calcium], and Simvastatin   Social History   Socioeconomic History   Marital status: Married    Spouse name: Not on file   Number of children: 2   Years of education: 15   Highest education level: Not on file  Occupational History   Occupation: Retired  Tobacco Use   Smoking status: Never   Smokeless tobacco: Never  Vaping Use   Vaping Use: Never used  Substance and Sexual Activity   Alcohol use: Never    Comment: rare   Drug use: No   Sexual activity: Not on file  Other Topics Concern   Not on file  Social History Narrative   Right handed   One story home   Drinks no caffeine, occ soda.   Lives with wife and son   Social Determinants of Health   Financial Resource Strain: Not on file  Food Insecurity: Not on file  Transportation Needs: Not on file  Physical Activity: Not on file  Stress: Not on file  Social Connections: Not on file     Family History: The patient's family history includes Diabetes in his father and mother.  ROS:   Please see the history of present illness.    All other systems reviewed and are negative.  EKGs/Labs/Other Studies Reviewed:    The following studies were reviewed today: EKG reveals sinus rhythm bradycardia nonspecific ST-T changes   Recent Labs: No results found for requested labs within last 365 days.  Recent Lipid Panel No results found for: "CHOL", "TRIG", "HDL", "CHOLHDL", "VLDL", "LDLCALC", "LDLDIRECT"  Physical Exam:    VS:  BP 102/60 (BP  Location: Left Arm, Patient Position: Sitting)   Pulse (!) 54   Ht 6\' 1"  (1.854 m)   Wt 222 lb (100.7 kg)   SpO2 99%   BMI 29.29 kg/m     Wt Readings from Last 3 Encounters:  09/06/22 222 lb (100.7 kg)  05/30/22 222 lb (100.7 kg)  11/09/21 217 lb (98.4 kg)     GEN: Patient is in no acute distress HEENT: Normal NECK: No JVD; No carotid bruits LYMPHATICS: No lymphadenopathy CARDIAC: Hear sounds regular, 2/6 systolic murmur at the apex. RESPIRATORY:  Clear to auscultation without rales, wheezing or rhonchi  ABDOMEN: Soft, non-tender, non-distended MUSCULOSKELETAL:  No edema; No deformity  SKIN: Warm and dry NEUROLOGIC:  Alert and oriented x 3 PSYCHIATRIC:  Normal affect   Signed, 01/07/22, MD  09/06/2022 1:25 PM    [facility]

## 2022-09-06 NOTE — Patient Instructions (Signed)
Medication Instructions:  Your physician recommends that you continue on your current medications as directed. Please refer to the Current Medication list given to you today.  *If you need a refill on your cardiac medications before your next appointment, please call your pharmacy*   Lab Work: None If you have labs (blood work) drawn today and your tests are completely normal, you will receive your results only by: MyChart Message (if you have MyChart) OR A paper copy in the mail If you have any lab test that is abnormal or we need to change your treatment, we will call you to review the results.   Testing/Procedures: None   Follow-Up: At [facility], you and your health needs are our priority.  As part of our continuing mission to provide you with exceptional heart care, we have created designated Provider Care Teams.  These Care Teams include your primary Cardiologist (physician) and Advanced Practice Providers (APPs -  Physician Assistants and Nurse Practitioners) who all work together to provide you with the care you need, when you need it.  We recommend signing up for the patient portal called "MyChart".  Sign up information is provided on this After Visit Summary.  MyChart is used to connect with patients for Virtual Visits (Telemedicine).  Patients are able to view lab/test results, encounter notes, upcoming appointments, etc.  Non-urgent messages can be sent to your provider as well.   To learn more about what you can do with MyChart, go to [email or web address].    Your next appointment:   1 year(s)  The format for your next appointment:   In Person  Provider:   Rajan Revankar, MD  Other Instructions None  Important Information About Sugar       

## 2022-11-01 ENCOUNTER — Ambulatory Visit: Payer: Medicare Other | Admitting: Podiatry

## 2022-11-01 ENCOUNTER — Encounter: Payer: Self-pay | Admitting: Podiatry

## 2022-11-01 VITALS — BP 145/73

## 2022-11-01 DIAGNOSIS — M2011 Hallux valgus (acquired), right foot: Secondary | ICD-10-CM | POA: Diagnosis not present

## 2022-11-01 DIAGNOSIS — E1169 Type 2 diabetes mellitus with other specified complication: Secondary | ICD-10-CM | POA: Diagnosis not present

## 2022-11-01 DIAGNOSIS — M2012 Hallux valgus (acquired), left foot: Secondary | ICD-10-CM

## 2022-11-01 DIAGNOSIS — E119 Type 2 diabetes mellitus without complications: Secondary | ICD-10-CM | POA: Diagnosis not present

## 2022-11-01 DIAGNOSIS — M79675 Pain in left toe(s): Secondary | ICD-10-CM | POA: Diagnosis not present

## 2022-11-01 DIAGNOSIS — M79674 Pain in right toe(s): Secondary | ICD-10-CM

## 2022-11-01 DIAGNOSIS — B351 Tinea unguium: Secondary | ICD-10-CM | POA: Diagnosis not present

## 2022-11-01 DIAGNOSIS — L84 Corns and callosities: Secondary | ICD-10-CM

## 2022-11-01 NOTE — Progress Notes (Unsigned)
ANNUAL DIABETIC FOOT EXAM  Subjective: Joseph Lamb presents today {jgcomplaint:23593}.  Chief Complaint  Patient presents with   Nail Problem    [facility] BS-[id number] A1C-"below 7" PCP-Shaw, Kimberlee PCP VST- 08/2022    Patient confirms h/o diabetes.  Patient relates {Numbers; 0-100:15068} year h/o diabetes.  Patient denies any h/o foot wounds.  Patient has h/o foot ulcer of {jgPodToeLocator:23637}, which healed via help of ***.  Patient has h/o amputation(s): {jgamp:23617}.  Patient endorses symptoms of foot numbness.   Patient endorses symptoms of foot tingling.  Patient endorses symptoms of burning in feet.  Patient endorses symptoms of pins/needles sensation in feet.  Patient denies any numbness, tingling, burning, or pins/needle sensation in feet.  Patient has been diagnosed with neuropathy and it is managed with {JGNEUROPATHYMEDS:27053}.  Risk factors: {jgriskfactors:24044}.  Mayra Neer, MD is patient's PCP. Last visit was {Time; dates multiple:15870}***.  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   Mixed hyperlipidemia 10/11/2021   Obesity 10/11/2021   Other male erectile dysfunction 10/11/2021   Pre-diabetes    Primary open angle glaucoma (POAG) of left eye, moderate stage 10/11/2021   Primary open-angle glaucoma, right eye, mild stage 10/11/2021   Sinus bradycardia 03/08/2020   Symptomatic cholelithiasis 08/31/2021   Tremor  10/11/2021   Type 2 diabetes mellitus with other specified complication ([place]) 06/09/8181   Vascular dementia ([place]) 10/11/2021   Patient Active Problem List   Diagnosis Date Noted   Renal insufficiency 09/06/2022   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   Atrial fibrillation ([place]) 10/11/2021   Adverse effect of antihyperlipidemic and antiarteriosclerotic drugs, initial encounter 10/11/2021   Chronic kidney disease, stage 3 unspecified ([place]) 10/11/2021   Obesity 10/11/2021   Other male erectile dysfunction 10/11/2021   Primary open angle glaucoma (POAG) of left eye, moderate stage 10/11/2021   Primary open-angle glaucoma, right eye, mild stage 10/11/2021   Tremor 10/11/2021   Vascular dementia ([place]) 10/11/2021   Type 2 diabetes mellitus with other specified complication ([place]) 99/37/1696   Mixed hyperlipidemia 10/11/2021   Cholecystitis 09/01/2021   Symptomatic cholelithiasis 08/31/2021   Essential tremor 08/31/2021   Glaucoma 08/31/2021   Arrhythmia    High cholesterol    Hypertension    Pre-diabetes    Dizzy spells 03/08/2020   Sinus bradycardia 03/08/2020   Memory loss 06/14/2015   Essential hypertension 06/14/2015   Hyperlipidemia 06/14/2015   Diabetes mellitus type 2, uncomplicated ([place]) 78/93/8101   Past Surgical History:  Procedure Laterality Date   CHOLECYSTECTOMY N/A 08/31/2021   Procedure: LAPAROSCOPIC CHOLECYSTECTOMY;  Surgeon: Felicie Morn, MD;  Location: [facility];  Service: General;  Laterality: N/A;   EYE SURGERY     GALLBLADDER SURGERY     PROSTATE SURGERY     Current Outpatient Medications on File Prior to Visit  Medication Sig Dispense Refill   amLODipine (NORVASC) 5 MG tablet TAKE 1 TABLET BY MOUTH EVERY DAY 90 tablet 1   aspirin EC 325 MG tablet Take 325 mg by mouth  every 4 (four) hours as needed for mild pain (headache). (Patient not taking: Reported on 09/06/2022)     donepezil (ARICEPT) 5 MG tablet Take 1 tablet (5 mg total) by mouth at  bedtime. 30 tablet 11   erythromycin ophthalmic ointment Place a 1/2 inch ribbon of ointment into the lower eyelid 4 times daily. 3.5 g 0   hydrochlorothiazide (HYDRODIURIL) 25 MG tablet Take 25 mg by mouth at bedtime.     latanoprost (XALATAN) 0.005 % ophthalmic solution Place 1 drop into both eyes at bedtime.     Multiple Vitamins-Minerals (MULTIVITAMIN MEN 50+) TABS Take 1 tablet by mouth daily.     pravastatin (PRAVACHOL) 40 MG tablet Take 1 tablet (40 mg total) by mouth daily. 90 tablet 3   propranolol (INDERAL) 20 MG tablet Take 20 mg by mouth daily. Take 1-2 tablets daily as needed for trimmers     Semaglutide,0.25 or 0.5MG /DOS, (OZEMPIC, 0.25 OR 0.5 MG/DOSE,) 2 MG/3ML SOPN Inject 0.25 mg into the skin once a week.     tadalafil (CIALIS) 20 MG tablet Take 20 mg by mouth daily as needed for erectile dysfunction.     No current facility-administered medications on file prior to visit.    Allergies  Allergen Reactions   Lisinopril Cough   Rosuvastatin     Other reaction(s): fatigue   Telmisartan     Other reaction(s): decreased kidney functions   Crestor [Rosuvastatin Calcium]     " fatigue"    Simvastatin Other (See Comments)    "fatigue"   Social History   Occupational History   Occupation: Retired  Tobacco Use   Smoking status: Never   Smokeless tobacco: Never  Vaping Use   Vaping Use: Never used  Substance and Sexual Activity   Alcohol use: Never    Comment: rare   Drug use: No   Sexual activity: Not on file   Family History  Problem Relation Age of Onset   Diabetes Mother    Diabetes Father     There is no immunization history on file for this patient.   Review of Systems: Negative except as noted in the HPI.   Objective: Vitals:   11/01/22 0851  BP: (!) 145/73    Joseph Lamb is a pleasant 72 y.o. male in NAD. AAO X 3.  Vascular Examination: {jgvascular:23595}  Dermatological Examination: {jgderm:23598}  Neurological  Examination: {jgneuro:23601::"Protective sensation intact 5/5 intact bilaterally with 10g monofilament b/l.","Vibratory sensation intact b/l.","Proprioception intact bilaterally."}  Musculoskeletal Examination: {jgmsk:23600}  Footwear Assessment: Does the patient wear appropriate shoes? {Yes,No}. Does the patient need inserts/orthotics? {Yes,No}.  ADA Risk Categorization: Low Risk :  Patient has all of the following: Intact protective sensation No prior foot ulcer  No severe deformity Pedal pulses present  High Risk  Patient has one or more of the following: Loss of protective sensation Absent pedal pulses Severe Foot deformity History of foot ulcer  Assessment: 1. Pain due to onychomycosis of toenails of both feet   2. Hallux valgus, acquired, bilateral   3. Type 2 diabetes mellitus with other specified complication, without long-term current use of insulin ([place])   4. Encounter for diabetic foot exam ([place])      Plan: No orders of the defined types were placed in this encounter.   No orders of the defined types were placed in this encounter.   None  {jgplan:23602::"-Patient/POA to call should there be question/concern in the interim."} No follow-ups on file.  Marzetta Board, DPM

## 2022-12-02 ENCOUNTER — Ambulatory Visit: Payer: Medicare Other | Admitting: Physician Assistant

## 2022-12-02 ENCOUNTER — Encounter: Payer: Self-pay | Admitting: Physician Assistant

## 2022-12-02 VITALS — BP 125/69 | HR 64 | Ht 73.0 in | Wt 219.3 lb

## 2022-12-02 DIAGNOSIS — I999 Unspecified disorder of circulatory system: Secondary | ICD-10-CM | POA: Diagnosis not present

## 2022-12-02 DIAGNOSIS — F0670 Mild neurocognitive disorder due to known physiological condition without behavioral disturbance: Secondary | ICD-10-CM | POA: Diagnosis not present

## 2022-12-02 DIAGNOSIS — R251 Tremor, unspecified: Secondary | ICD-10-CM | POA: Diagnosis not present

## 2022-12-02 MED ORDER — CARBIDOPA-LEVODOPA 25-100 MG PO TABS: ORAL_TABLET | ORAL | 3 refills | Status: DC

## 2022-12-02 NOTE — Patient Instructions (Addendum)
Continue Donepezil 5  mg daily Repeat Neuropsych testing for clarity of diagnosis and disease trajectory    Start carbidopa /levodopa (Sinemet) 25/'100mg'$  tablets.  Week 1: Take 1/2  tablet morning, 1/2 tab noon, and 1/2  tablet in evening/bedtime.  For 1 week   Take 1 tablet three times daily.   Take the medication at the same time everyday. The medication does not get absorbed into your body as well, if you take it with protein-containing foods (meat, dairy, beans). Try taking the medication about one hour before meals. If you experience nausea by taking it on an empty stomach, you may take it with carbohydrate-containing food,such as bread or crackers. Side effects to look out for, include dizziness, nausea, vivid dreams and hallucinations.  3. Follow-up in 6 months    FALL PRECAUTIONS: Be cautious when walking. Scan the area for obstacles that may increase the risk of trips and falls. When getting up in the mornings, sit up at the edge of the bed for a few minutes before getting out of bed. Consider elevating the bed at the head end to avoid drop of blood pressure when getting up. Walk always in a well-lit room (use night lights in the walls). Avoid area rugs or power cords from appliances in the middle of the walkways. Use a walker or a cane if necessary and consider physical therapy for balance exercise. Get your eyesight checked regularly.  FINANCIAL OVERSIGHT: Supervision, especially oversight when making financial decisions or transactions is also recommended as difficulties arise.  HOME SAFETY: Consider the safety of the kitchen when operating appliances like stoves, microwave oven, and blender. Consider having supervision and share cooking responsibilities until no longer able to participate in those. Accidents with firearms and other hazards in the house should be identified and addressed as well.  DRIVING: Regarding driving, in patients with progressive memory problems, driving will be  impaired. We advise to have someone else do the driving if trouble finding directions or if minor accidents are reported. Independent driving assessment is available to determine safety of driving.  ABILITY TO BE LEFT ALONE: If patient is unable to contact 911 operator, consider using LifeLine, or when the need is there, arrange for someone to stay with patients. Smoking is a fire hazard, consider supervision or cessation. Risk of wandering should be assessed by caregiver and if detected at any point, supervision and safe proof recommendations should be instituted.  MEDICATION SUPERVISION: Inability to self-administer medication needs to be constantly addressed. Implement a mechanism to ensure safe administration of the medications.  RECOMMENDATIONS FOR ALL PATIENTS WITH MEMORY PROBLEMS: 1. Continue to exercise (Recommend 30 minutes of walking everyday, or 3 hours every week) 2. Increase social interactions - continue going to [place] and enjoy social gatherings with friends and family 3. Eat healthy, avoid fried foods and eat more fruits and vegetables 4. Maintain adequate blood pressure, blood sugar, and blood cholesterol level. Reducing the risk of stroke and cardiovascular disease also helps promoting better memory. 5. Avoid stressful situations. Live a simple life and avoid aggravations. Organize your time and prepare for the next day in anticipation. 6. Sleep well, avoid any interruptions of sleep and avoid any distractions in the bedroom that may interfere with adequate sleep quality 7. Avoid sugar, avoid sweets as there is a strong link between excessive sugar intake, diabetes, and cognitive impairment We discussed the Mediterranean diet, which has been shown to help patients reduce the risk of progressive memory disorders and reduces cardiovascular risk.  This includes eating fish, eat fruits and green leafy vegetables, nuts like almonds and hazelnuts, walnuts, and also use olive oil. Avoid fast  foods and fried foods as much as possible. Avoid sweets and sugar as sugar use has been linked to worsening of memory function.  There is always a concern of gradual progression of memory problems. If this is the case, then we may need to adjust level of care according to patient needs. Support, both to the patient and caregiver, should then be put into place.       Mediterranean Diet  Why follow it? Research shows. Those who follow the Mediterranean diet have a reduced risk of heart disease  The diet is associated with a reduced incidence of Parkinson's and Alzheimer's diseases People following the diet may have longer life expectancies and lower rates of chronic diseases  The Dietary Guidelines for Americans recommends the Mediterranean diet as an eating plan to promote health and prevent disease  What Is the Mediterranean Diet?  Healthy eating plan based on typical foods and recipes of Mediterranean-style cooking The diet is primarily a plant based diet; these foods should make up a majority of meals   Starches - Plant based foods should make up a majority of meals - They are an important sources of vitamins, minerals, energy, antioxidants, and fiber - Choose whole grains, foods high in fiber and minimally processed items  - Typical grain sources include wheat, oats, barley, corn, brown rice, bulgar, farro, millet, polenta, couscous  - Various types of beans include chickpeas, lentils, fava beans, black beans, white beans   Fruits  Veggies - Large quantities of antioxidant rich fruits & veggies; 6 or more servings  - Vegetables can be eaten raw or lightly drizzled with oil and cooked  - Vegetables common to the traditional Mediterranean Diet include: artichokes, arugula, beets, broccoli, brussel sprouts, cabbage, carrots, celery, collard greens, cucumbers, eggplant, kale, leeks, lemons, lettuce, mushrooms, okra, onions, peas, peppers, potatoes, pumpkin, radishes, rutabaga, shallots,  spinach, sweet potatoes, turnips, zucchini - Fruits common to the Mediterranean Diet include: apples, apricots, avocados, cherries, clementines, dates, figs, grapefruits, grapes, melons, nectarines, oranges, peaches, pears, pomegranates, strawberries, tangerines  Fats - Replace butter and margarine with healthy oils, such as olive oil, canola oil, and tahini  - Limit nuts to no more than a handful a day  - Nuts include walnuts, almonds, pecans, pistachios, pine nuts  - Limit or avoid candied, honey roasted or heavily salted nuts - Olives are central to the [employer] - can be eaten whole or used in a variety of dishes   Meats Protein - Limiting red meat: no more than a few times a month - When eating red meat: choose lean cuts and keep the portion to the size of deck of cards - Eggs: approx. 0 to 4 times a week  - Fish and lean poultry: at least 2 a week  - Healthy protein sources include, chicken, [place], lean beef, lamb - Increase intake of seafood such as tuna, salmon, trout, mackerel, shrimp, scallops - Avoid or limit high fat processed meats such as sausage and bacon  Dairy - Include moderate amounts of low fat dairy products  - Focus on healthy dairy such as fat free yogurt, skim milk, low or reduced fat cheese - Limit dairy products higher in fat such as whole or 2% milk, cheese, ice cream  Alcohol - Moderate amounts of red wine is ok  - No more than 5 oz daily  for women (all ages) and men older than age 67  - No more than 10 oz of wine daily for men younger than 33  Other - Limit sweets and other desserts  - Use herbs and spices instead of salt to flavor foods  - Herbs and spices common to the traditional Mediterranean Diet include: basil, bay leaves, chives, cloves, cumin, fennel, garlic, lavender, marjoram, mint, oregano, parsley, pepper, rosemary, sage, savory, sumac, tarragon, thyme   It's not just a diet, it's a lifestyle:  The Mediterranean diet includes lifestyle  factors typical of those in the region  Foods, drinks and meals are best eaten with others and savored Daily physical activity is important for overall good health This could be strenuous exercise like running and aerobics This could also be more leisurely activities such as walking, housework, yard-work, or taking the stairs Moderation is the key; a balanced and healthy diet accommodates most foods and drinks Consider portion sizes and frequency of consumption of certain foods   Meal Ideas & Options:  Breakfast:  Whole wheat toast or whole wheat English muffins with peanut butter & hard boiled egg Steel cut oats topped with apples & cinnamon and skim milk  Fresh fruit: banana, strawberries, melon, berries, peaches  Smoothies: strawberries, bananas, greek yogurt, peanut butter Low fat greek yogurt with blueberries and granola  Egg white omelet with spinach and mushrooms Breakfast couscous: whole wheat couscous, apricots, skim milk, cranberries  Sandwiches:  Hummus and grilled vegetables (peppers, zucchini, squash) on whole wheat bread   Grilled chicken on whole wheat pita with lettuce, tomatoes, cucumbers or tzatziki  [place] salad on whole wheat bread: tuna salad made with greek yogurt, olives, red peppers, capers, green onions Garlic rosemary lamb pita: lamb sauted with garlic, rosemary, salt & pepper; add lettuce, cucumber, greek yogurt to pita - flavor with lemon juice and black pepper  Seafood:  Mediterranean grilled salmon, seasoned with garlic, basil, parsley, lemon juice and black pepper Shrimp, lemon, and spinach whole-grain pasta salad made with low fat greek yogurt  Seared scallops with lemon orzo  Seared tuna steaks seasoned salt, pepper, coriander topped with tomato mixture of olives, tomatoes, olive oil, minced garlic, parsley, green onions and cappers  Meats:  Herbed greek chicken salad with kalamata olives, cucumber, feta  Red bell peppers stuffed with spinach, bulgur, lean  ground beef (or lentils) & topped with feta   Kebabs: skewers of chicken, tomatoes, onions, zucchini, squash  [place] burgers: made with red onions, mint, dill, lemon juice, feta cheese topped with roasted red peppers Vegetarian Cucumber salad: cucumbers, artichoke hearts, celery, red onion, feta cheese, tossed in olive oil & lemon juice  Hummus and whole grain pita points with a greek salad (lettuce, tomato, feta, olives, cucumbers, red onion) Lentil soup with celery, carrots made with vegetable broth, garlic, salt and pepper  Tabouli salad: parsley, bulgur, mint, scallions, cucumbers, tomato, radishes, lemon juice, olive oil, salt and pepper.

## 2022-12-02 NOTE — Progress Notes (Addendum)
Assessment/Plan:   Mild vascular neurocognitive disorder Tremors, concern for Parkinson's Disease   Joseph Lamb is a very pleasant 72 y.o. RH male with a history of hypertension, hyperlipidemia, diabetes, glaucoma, CKD stage III, history of paroxysmal atrial fibrillation, and a history of mild vascular neurocognitive disorder diagnosed by Neuropsych evaluation in August 2021.  Presenting today in follow-up for evaluation of memory loss.  Prior MRI of the brain personally reviewed was remarkable for moderate chronic microvascular disease.  Patient is on donepezil 5 mg daily, tolerating well MMSE today is 29/30, stable from prior.  As for his tremors, there is concern for Parkinson's disease, with increased R>L cyclic tremors at rest, postural and on intention, lip tremor, increased salivation and on exam increased B UE tone and cogwheeling.    Recommendations:   Follow up in 6  months. Continue donepezil 5 mg daily, side effects discussed Recommend good control of cardiovascular risk factors For tremor start Sinemet 25/100, half tab tid for 1 week, then increase to 1 tab tid, side effects discussed  Repeat neuropsychological testing for clarity of the diagnosis and disease trajectory Monitor driving    Subjective:   This patient is accompanied in the office by his wife  who supplements the history. Previous records as well as any outside records available were reviewed prior to todays visit.   Patient was last seen on 05/30/2022.  Last MMSE was 29/30.     Any changes in memory since last visit? Patient denies but Wife says it is worse," forgets what he is going to do, forgets things in the middle of the conversation, forgets closing cabinet doors and fridge".  repeats oneself?  Endorsed Disoriented when walking into a room?  Patient denies except occasionally not remembering what patient came to the room for    Leaving objects in unusual places?  Patient denies   Wandering  behavior?   denies  Any personality changes since last visit?   denies   Any worsening depression?: denies   Hallucinations or paranoia?  denies   Seizures?   denies    Any sleep changes?  He sleeps watching TV. Denies  vivid dreams,  occasionally he talks in his sleep, no shadow boxing  or sleepwalking   Sleep apnea?  Endorsed, but does not use the CPAP  Any hygiene concerns?   denies   Independent of bathing and dressing?  Endorsed  Does the patient needs help with medications? wife is in charge   Who is in charge of the finances?wife is in charge     Any changes in appetite?  denies     Patient have trouble swallowing?  Denies, but salivates more Does the patient cook?  No  Any kitchen accidents such as leaving the stove on?   denies   Any headaches?    denies   Vision changes? denies Chronic back pain  denies   Ambulates with difficulty?    denies   Recent falls or head injuries?    denies     Unilateral weakness, numbness or tingling?   denies   Any tremors?Worse,  increased R>L cyclic tremors at rest, postural and on intention, lip tremor, increased salivation  . Denies dropping objects  Any anosmia?    denies   Any incontinence of urine?  denies   Any bowel dysfunction? Constipation  Patient lives   with wife  Does the patient drive? Endorsed, no issues.    History on Initial Assessment 04/05/2020: This is a  72 year old right-handed man with a history of hypertension, diabetes, hyperlipidemia, presenting for evaluation of memory changes. He was evaluated in our office in September 2016 for similar concerns, MOCA score at that time was 29/30. He was also evaluated by neurologist Dr. Leta Baptist in 2013 for memory loss that started around 2010 or 2011. MOCA score 28/30 in 2013.  MRI brain in 2012 showed mild diffuse volume loss, mild to moderate chronic microvascular disease, a small area of cortical encephalomalacia in the right perirolandic cortex. He has been taking Donepezil '5mg'$   daily since 2013, however on his visit with Dr. Brigitte Pulse last March, he reported he was not taking Donepezil and Sertraline regularly because he did not think he needed it. It was unclear if this was due to stubbornness or cognitive decline, his wife continued to report worsening memory and behavior. He states he has been taking medications regularly since March, he feels his memory is "fair, not perfect." He remembers "when I want to remember." His wife tells him he forgets to do things. He denies getting lost driving. He manages his own medications and occasionally misses doses when "I get lazy and go to sleep." He misplaces things frequently. If he is not careful, he has burned food. His wife manages finances. He feels more laid back since taking the sertraline regularly. He denies any hallucinations. Sleep is good.    He is concerned about occasional bilateral hand tremors that started 4-5 months ago. He has noticed a little weakness in his left hand. Tremors are noticeable when he is using a pen or fork. No family history of tremors. He denies any headaches, diplopia, dysarthria/dysphagia, neck/back pain, focal numbness/tingling, bowel/bladder dysfunction, anosmia. No family history of dementia, no history of significant head injuries or alcohol use.     Diagnostic Data: MRI brain with and without contrast done 05/2020 did not show any acute changes. There was moderate chronic microvascular disease, significantly progressed since MRI brain from 2002.    Neuropsychological testing in August 2021 indicated Mild vascular neurocognitive disorder, it was also noted that longstanding differences of opinion/conflict between him and his wife are coloring family report of symptoms.   Past Medical History:  Diagnosis Date   Adverse effect of antihyperlipidemic and antiarteriosclerotic drugs, initial encounter 10/11/2021   Arrhythmia    Atrial fibrillation ([place])    Cholecystitis 09/01/2021   Chronic kidney  disease, stage 3 unspecified ([place]) 10/11/2021   Diabetes mellitus type 2, uncomplicated ([place]) 99991111   Dizzy spells 03/08/2020   Essential hypertension 06/14/2015   Essential tremor 08/31/2021   Glaucoma 08/31/2021   High cholesterol    Hyperlipidemia 06/14/2015   Hypertension    Memory loss 06/14/2015   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   Mixed hyperlipidemia 10/11/2021   Obesity 10/11/2021   Other male erectile dysfunction 10/11/2021   Pre-diabetes    Primary open angle glaucoma (POAG) of left eye, moderate stage 10/11/2021   Primary open-angle glaucoma, right eye, mild stage 10/11/2021   Sinus bradycardia 03/08/2020   Symptomatic cholelithiasis 08/31/2021   Tremor 10/11/2021   Type 2 diabetes mellitus with other specified complication ([place]) [id number]   Vascular dementia ([place]) 10/11/2021     Past Surgical History:  Procedure Laterality Date   CHOLECYSTECTOMY N/A 08/31/2021   Procedure: LAPAROSCOPIC CHOLECYSTECTOMY;  Surgeon: Felicie Morn, MD;  Location: [facility];  Service: General;  Laterality: N/A;   [place]  PREVIOUS MEDICATIONS:   CURRENT MEDICATIONS:  Outpatient Encounter Medications as of 12/02/2022  Medication Sig   amLODipine (NORVASC) 5 MG tablet TAKE 1 TABLET BY MOUTH EVERY DAY   donepezil (ARICEPT) 5 MG tablet Take 1 tablet (5 mg total) by mouth at bedtime.   hydrochlorothiazide (HYDRODIURIL) 25 MG tablet Take 25 mg by mouth at bedtime.   latanoprost (XALATAN) 0.005 % ophthalmic solution Place 1 drop into both eyes at bedtime.   Multiple Vitamins-Minerals (MULTIVITAMIN MEN 50+) TABS Take 1 tablet by mouth daily.   pravastatin (PRAVACHOL) 40 MG tablet Take 1 tablet (40 mg total) by mouth daily.   Semaglutide,0.25 or 0.'5MG'$ /DOS, (OZEMPIC, 0.25 OR 0.5 MG/DOSE,) 2 MG/3ML SOPN Inject 0.5 mg into the skin once a week.   tadalafil (CIALIS) 20 MG tablet Take 20 mg by mouth daily as needed for erectile  dysfunction.   [DISCONTINUED] carbidopa-levodopa (SINEMET) 25-100 MG tablet Week 1: Take 1/2  tablet morning, 1/2 tab noon, and 1/2  tablet in evening/bedtime.  Week 4  & thereafter: Take 1 tablet three times daily.   aspirin EC 325 MG tablet Take 325 mg by mouth every 4 (four) hours as needed for mild pain (headache). (Patient not taking: Reported on 09/06/2022)   carbidopa-levodopa (SINEMET) 25-100 MG tablet Week 1: Take 1/2  tablet morning, 1/2 tab noon, and 1/2  tablet in evening/bedtime. Week 4  & thereafter: Take 1 tablet three times daily.   erythromycin ophthalmic ointment Place a 1/2 inch ribbon of ointment into the lower eyelid 4 times daily. (Patient not taking: Reported on 12/02/2022)   propranolol (INDERAL) 20 MG tablet Take 20 mg by mouth daily. Take 1-2 tablets daily as needed for trimmers (Patient not taking: Reported on 12/02/2022)   No facility-administered encounter medications on file as of 12/02/2022.     Objective:     PHYSICAL EXAMINATION:    VITALS:   Vitals:   12/02/22 1131  BP: 125/69  Pulse: 64  SpO2: 98%  Weight: 219 lb 4.8 oz (99.5 kg)  Height: '6\' 1"'$  (1.854 m)    GEN:  The patient appears stated age and is in NAD. HEENT:  Normocephalic, atraumatic.   Neurological examination:  General: NAD, well-groomed, appears stated age. Orientation: The patient is alert. Oriented to person, place and date Cranial nerves: There is good facial symmetry.The speech is fluent and clear. No aphasia or dysarthria. Fund of knowledge is appropriate. Recent memory impaired and remote memory is normal.  Attention and concentration are normal.  Able to name objects and repeat phrases.  Hearing is intact to conversational tone.   Delayed recall 3/3 Sensation: Sensation is intact to light touch throughout Motor: Strength is at least antigravity x4. Tremors: none  DTR's 2/4 in UE/LE      05/08/2020    9:00 AM 06/14/2015    1:15 PM  Montreal Cognitive Assessment   Visuospatial/  Executive (0/5) 4 5  Naming (0/3) 3 3  Attention: Read list of digits (0/2) 2 2  Attention: Read list of letters (0/1) 1 1  Attention: Serial 7 subtraction starting at 100 (0/3) 3 3  Language: Repeat phrase (0/2) 1 2  Language : Fluency (0/1) 0 1  Abstraction (0/2) 2 2  Delayed Recall (0/5) 4 4  Orientation (0/6) 6 6  Total 26 29  Adjusted Score (based on education) 26 29       12/02/2022   12:00 PM 11/09/2021    1:00 PM  MMSE - Mini Mental State Exam  Orientation to time 5 5  Orientation to Place 5 5  Registration 3 3  Attention/ Calculation 5 4  Recall 3 3  Language- name 2 objects 2 2  Language- repeat 1 1  Language- follow 3 step command 3 3  Language- read & follow direction 1 1  Write a sentence 0 1  Copy design 1 1  Total score 29 29       Movement examination: Tone: There is increase tone in the UE. LE is normal . + Cogwheeling B R>L Abnormal movements increased R>L cyclic tremors at rest, postural and on intention, lip tremor,  No myoclonus.  No asterixis.   Coordination:  There is no decremation with RAM's. Normal finger to nose  Gait and Station: The patient has no difficulty arising out of a deep-seated chair without the use of the hands. The patient's stride length is good.  Gait is cautious and narrow.   Thank you for allowing [place] the opportunity to participate in the care of this nice patient. Please do not hesitate to contact us for any questions or concerns.   Total time spent on today's visit was 36 minutes dedicated to this patient today, preparing to see patient, examining the patient, ordering tests and/or medications and counseling the patient, documenting clinical information in the EHR or other health record, independently interpreting results and communicating results to the patient/family, discussing treatment and goals, answering patient's questions and coordinating care.  Cc:  Mayra Neer, MD  Sharene Butters 12/02/2022 12:59 PM

## 2023-02-07 ENCOUNTER — Encounter: Payer: Self-pay | Admitting: Podiatry

## 2023-02-07 ENCOUNTER — Ambulatory Visit (INDEPENDENT_AMBULATORY_CARE_PROVIDER_SITE_OTHER): Payer: Medicare Other | Admitting: Podiatry

## 2023-02-07 VITALS — BP 135/64

## 2023-02-07 DIAGNOSIS — M79674 Pain in right toe(s): Secondary | ICD-10-CM | POA: Diagnosis not present

## 2023-02-07 DIAGNOSIS — M79675 Pain in left toe(s): Secondary | ICD-10-CM

## 2023-02-07 DIAGNOSIS — E1169 Type 2 diabetes mellitus with other specified complication: Secondary | ICD-10-CM

## 2023-02-07 DIAGNOSIS — L84 Corns and callosities: Secondary | ICD-10-CM

## 2023-02-07 DIAGNOSIS — B351 Tinea unguium: Secondary | ICD-10-CM | POA: Diagnosis not present

## 2023-02-07 NOTE — Progress Notes (Signed)
  Subjective:  Patient ID: Joseph Lamb, male    DOB: 05/13/1951,  MRN: [id number]  Joseph Lamb presents to clinic today for preventative diabetic foot care and painful elongated mycotic toenails 1-5 bilaterally which are tender when wearing enclosed shoe gear. Pain is relieved with periodic professional debridement.  Chief Complaint  Patient presents with   Nail Problem    DFC BS-did not check today A1C-do not know PCP-Kimberlee Shaw PCP VST-09/2022    New problem(s): None.   PCP is Lupita Raider, MD.  Allergies  Allergen Reactions   Lisinopril Cough   Rosuvastatin     Other reaction(s): fatigue   Telmisartan     Other reaction(s): decreased kidney functions   Crestor [Rosuvastatin Calcium]     " fatigue"    Simvastatin Other (See Comments)    "fatigue"    Review of Systems: Negative except as noted in the HPI.  Objective: No changes noted in today's physical examination. Vitals:   02/07/23 0940  BP: 135/64   Joseph Lamb is a pleasant 72 y.o. male WD, WN in NAD. AAO x 3. Vascular Examination: CFT <3 seconds b/l LE. Palpable PT pulse(s) b/l LE. Faintly palpable DP pulses b/l LE. Pedal hair absent. No pain with calf compression b/l. Lower extremity skin temperature gradient within normal limits. No edema noted b/l LE. No varicosities noted. No cyanosis or clubbing noted b/l LE.  Dermatological Examination: Pedal skin is warm and supple b/l LE. No open wounds b/l LE. No interdigital macerations noted b/l LE.  Toenails 1-5 b/l elongated, discolored, dystrophic, thickened, crumbly with subungual debris and tenderness to dorsal palpation.   Hyperkeratotic lesion(s) submet head 5 right foot.  No erythema, no edema, no drainage, no fluctuance.  Neurological Examination: Protective sensation intact 5/5 intact bilaterally with 10g monofilament b/l. Vibratory sensation intact b/l. Proprioception intact bilaterally.  Musculoskeletal Examination: Normal  muscle strength 5/5 to all lower extremity muscle groups bilaterally. Hallux valgus with bunion deformity noted b/l lower extremities.. No pain, crepitus or joint limitation noted with ROM b/l LE.  Patient ambulates independently without assistive aids.  Assessment/Plan: 1. Pain due to onychomycosis of toenails of both feet   2. Callus   3. Type 2 diabetes mellitus with other specified complication, without long-term current use of insulin (HCC)     -Consent given for treatment as described below: -Examined patient. -Mycotic toenails 1-5 bilaterally were debrided in length and girth with sterile nail nippers and dremel without incident. -Callus(es) submet head 5 right foot pared utilizing sharp debridement with sterile blade without complication or incident. Total number debrided =1. -Patient/POA to call should there be question/concern in the interim.   Return in about 3 months (around 05/10/2023).  Joseph Lamb, DPM

## 2023-02-19 ENCOUNTER — Other Ambulatory Visit (HOSPITAL_COMMUNITY): Payer: Self-pay | Admitting: Family Medicine

## 2023-02-19 DIAGNOSIS — E1169 Type 2 diabetes mellitus with other specified complication: Secondary | ICD-10-CM

## 2023-03-19 ENCOUNTER — Ambulatory Visit (HOSPITAL_COMMUNITY)
Admission: RE | Admit: 2023-03-19 | Discharge: 2023-03-19 | Disposition: A | Discharge status: Home / Self Care | Payer: Medicare Other | Source: Ambulatory Visit | Attending: Family Medicine | Admitting: Family Medicine

## 2023-03-19 DIAGNOSIS — E1169 Type 2 diabetes mellitus with other specified complication: Secondary | ICD-10-CM | POA: Insufficient documentation

## 2023-05-21 ENCOUNTER — Encounter: Payer: Self-pay | Admitting: Psychology

## 2023-05-21 ENCOUNTER — Ambulatory Visit: Payer: Medicare Other | Admitting: Psychology

## 2023-05-21 DIAGNOSIS — G3184 Mild cognitive impairment, so stated: Secondary | ICD-10-CM | POA: Diagnosis not present

## 2023-05-21 DIAGNOSIS — G4733 Obstructive sleep apnea (adult) (pediatric): Secondary | ICD-10-CM | POA: Insufficient documentation

## 2023-05-21 DIAGNOSIS — R4189 Other symptoms and signs involving cognitive functions and awareness: Secondary | ICD-10-CM

## 2023-05-21 DIAGNOSIS — I6381 Other cerebral infarction due to occlusion or stenosis of small artery: Secondary | ICD-10-CM | POA: Insufficient documentation

## 2023-05-21 DIAGNOSIS — R251 Tremor, unspecified: Secondary | ICD-10-CM | POA: Diagnosis not present

## 2023-05-21 DIAGNOSIS — I679 Cerebrovascular disease, unspecified: Secondary | ICD-10-CM | POA: Insufficient documentation

## 2023-05-21 HISTORY — DX: Mild cognitive impairment of uncertain or unknown etiology: G31.84

## 2023-05-21 NOTE — Progress Notes (Unsigned)
NEUROPSYCHOLOGICAL EVALUATION [place]. [facility] [place] Department of Neurology  Date of Evaluation: May 21, 2023  Reason for Referral:   Joseph Lamb is a 72 y.o. right-handed African-American male referred by Joseph Kays, PA-C, to characterize his current cognitive functioning and assist with diagnostic clarity and treatment planning in the context of a prior mild neurocognitive disorder diagnosis and concerns for progressive decline.   Assessment and Plan:   Clinical Impression(s): Joseph Lamb' pattern of performance is suggestive of a primary impairment surrounding processing speed. An isolated impairment was also exhibited across response inhibition, while further performance variability was exhibited across delayed retrieval aspects of memory. Performances were appropriate relative to age-matched peers across attention/concentration, other aspects of executive functioning outside of response inhibition, safety/judgment, receptive and expressive language, visuospatial abilities, and both encoding (i.e., learning) and recognition/consolidation aspects of memory. Joseph Lamb largely denied difficulties completing instrumental activities of daily living (ADLs) independently. 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  Relative to his previous neuropsychological evaluation in August 2021, Joseph Lamb exhibited a very large degree of stability. A subtle decline could be argued across basic attention; however, abilities were still normatively appropriate overall. Specific to memory, while a subtle to mild performance decline was seen across a story learning task, subtle to mild improvements were seen across list-based and daily living memory tasks, suggesting no consistent memory decline since 2021. All other  assessed cognitive domains were stable.   The etiology for his mild neurocognitive disorder presentation is unclear and may be mixed. Joseph Lamb previously theorized a primary vascular cause, likely given prior neuroimaging (2021) suggesting moderate microvascular ischemic disease which had progressed over the years, as well as lacunar infarctions in the bilateral centrum semiovale and right parietal cortex. This continues to be a reasonable conceptualization as current testing patterns, as well as general stability over time, align well with a primary vascular cause for dysfunction.   Despite this, I do feel that concerns surrounding Parkinson's disease should be further explored. Joseph Lamb has exhibited progressively worsening upper extremity resting tremors, predominantly impacting his right hand. While this was present when he was tested by Joseph Lamb in 2021, those notes minimally discuss it, suggesting that the tremors themselves were likely minimal at that time. Joseph Lamb also currently exhibited hypophonia, sialorrhea on the right side of his mouth, longstanding REM sleep behaviors, and perhaps a mildly bradykinetic gait. Testing patterns, especially a primary impairment across processing speed (i.e., bradyphrenia) also align with what can be seen in Parkinson's disease reasonably well. He had been previously prescribed carbidopa-levodopa by an unspecified clinician but self-discontinued this medication due to negative side effects. Further diagnostic work up would be beneficial to achieve greater clarity in this regard.   He did not describe fully-formed visual hallucinations, frequent falling behaviors, oculomotor dysfunction, or other involuntary limb movements, making an atypical parkinsonian presentation less likely at the present time. Testing patterns are also not currently compelling in this regard. Memory patterns are not concerning for underlying Alzheimer's disease. He and his wife  denied any personality or behavioral changes worrisome for frontotemporal lobar degeneration.   Recommendations: Given parkinsonian concerns, I would prefer that he meet for at least an initial visit with a movement disorders specialist (Joseph Lamb) to better inform the presence or absence of this illness. Depending on the results of this consultation, he may  choose to continue following with Joseph Lamb or Joseph Lamb. I will place a referral for him.   He has been prescribed donepezil/Aricept for memory loss in the past. Given relatively intact memory performances currently, as well as no compelling evidence to suggest progressive decline over time, this medication does not appear necessary. He should discuss this further with Joseph Lamb.   Untreated sleep apnea will certainly negatively impact day-to-day functioning and cognitive abilities. It will also increase his risk for heart attack, stroke, and dementia in the future. He is strongly encouraged to utilize a CPAP machine nightly to treat this condition.   Performance across neurocognitive testing is not a strong predictor of an individual's safety operating a motor vehicle. Should he or his family wish to pursue a formalized driving evaluation, they could reach out to the following agencies: [employer] in [place]: [phone] [facility]: [phone] [facility]: [phone] [facility]: [phone] or [phone]  Should there be progression of current deficits over time, Joseph Lamb is unlikely to regain any independent living skills lost. Therefore, it is recommended that he remain as involved as possible in all aspects of household chores, finances, and medication management, with supervision to ensure adequate performance. He will likely benefit from the establishment and maintenance of a routine in order to maximize his functional abilities over time.  If not already done, Mr.  Lamb and his family may want to discuss his wishes regarding durable power of attorney and medical decision making, so that he can have input into these choices. If they require legal assistance with this, long-term care resource access, or other aspects of estate planning, they could reach out to [facility] at [phone] for a free consultation.   Mr. Acree is encouraged to attend to lifestyle factors for brain health (e.g., regular physical exercise, good nutrition habits and consideration of the MIND-DASH diet, regular participation in cognitively-stimulating activities, and general stress management techniques), which are likely to have benefits for both emotional adjustment and cognition. In fact, in addition to promoting good general health, regular exercise incorporating aerobic activities (e.g., brisk walking, jogging, cycling, etc.) has been demonstrated to be a very effective treatment for depression and stress, with similar efficacy rates to both antidepressant medication and psychotherapy. Optimal control of vascular risk factors (including safe cardiovascular exercise and adherence to dietary recommendations) is encouraged. Continued participation in activities which provide mental stimulation and social interaction is also recommended.   When learning new information, he would benefit from information being broken up into small, manageable pieces. he may also find it helpful to articulate the material in his own words and in a context to promote encoding at the onset of a new task. This material may need to be repeated multiple times to promote encoding.  To address problems with processing speed, he may wish to consider:   -Ensuring that he is alerted when essential material or instructions are being presented   -Adjusting the speed at which new information is presented   -Allowing for more time in comprehending, processing, and responding in conversation   -Repeating and  paraphrasing instructions or conversations aloud  To address problems with fluctuating attention and/or executive dysfunction, he may wish to consider:   -Avoiding external distractions when needing to concentrate   -Limiting exposure to fast paced environments with multiple sensory demands   -Writing down complicated information and using checklists   -Attempting and completing one task at a time (i.e., no multi-tasking)   -  Verbalizing aloud each step of a task to maintain focus   -Taking frequent breaks during the completion of steps/tasks to avoid fatigue   -Reducing the amount of information considered at one time   -Scheduling more difficult activities for a time of day where he is usually most alert  Review of Records:   Mr. Auvil completed a comprehensive neuropsychological evaluation Clayborn Heron, Psy.D.) on 05/08/2020. Results suggested primary difficulties with information encoding and retrieval (mainly on verbal measures), as well as an isolated weakness across a timed number-symbol coding task. Further variability was exhibited across executive functioning. Overall however, difficulties were said to be quite mild. 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   He was most recently seen by [facility] Joseph Kays, PA-C) for follow-up on 12/02/2022. While Mr. Yuill largely denied prominent decline over the years, his wife did described her perception of worsening short-term memory and attentional capabilities. An increase in upper extremity resting tremors, R > L, was observed, as well as increased salivation. Trouble with ADLs was largely denied. Ultimately, Mr. Porter was referred for a comprehensive neuropsychological evaluation to characterize his cognitive abilities and to assist with diagnostic clarity and treatment planning.    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 Brain MRI on 05/09/2020 revealed moderate microvascular ischemic disease, said to have progressed relative to his prior scan. It also revealed remote lacunar infarctions in the bilateral centrum semiovale and right parietal cortex.   Past Medical History:  Diagnosis Date   Adverse effect of antihyperlipidemic and antiarteriosclerotic drugs, initial encounter 10/11/2021   Arrhythmia    Atrial fibrillation 10/11/2021   Cerebrovascular disease    moderate per 2021 MRI   Cholecystitis 09/01/2021   Chronic kidney disease, stage 3 unspecified 10/11/2021   Dizzy spells 03/08/2020   Erectile dysfunction 10/11/2021   Essential hypertension 06/14/2015   Glaucoma 08/31/2021   Hyperlipidemia 06/14/2015   Mild cognitive impairment of uncertain or unknown etiology 05/21/2023   Multiple lacunar infarcts    bilateral centrum semiovale and right parietal cortex.   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, moderate stage 10/11/2021   Primary open-Lamb glaucoma, right eye, mild stage 10/11/2021   Renal insufficiency 09/06/2022   Sinus bradycardia 03/08/2020   Tremor 10/11/2021   Type II diabetes mellitus 10/11/2021    Past Surgical History:  Procedure Laterality Date   CHOLECYSTECTOMY N/A 08/31/2021   Procedure: LAPAROSCOPIC CHOLECYSTECTOMY;  Surgeon: Quentin Ore, MD;  Location: [facility] OR;  Service: General;  Laterality: N/A;   EYE SURGERY     GALLBLADDER SURGERY     PROSTATE SURGERY      Current Outpatient Medications:    amLODipine (NORVASC) 5 MG tablet, TAKE 1 TABLET BY MOUTH EVERY DAY, Disp: 90 tablet, Rfl: 1   aspirin EC 325 MG tablet, Take 325 mg by mouth every 4 (four) hours as needed for mild pain (headache). (Patient not taking: Reported on 09/06/2022), Disp: , Rfl:    carbidopa-levodopa (SINEMET) 25-100 MG tablet, Week 1:  Take 1/2  tablet morning, 1/2 tab noon, and 1/2  tablet in evening/bedtime. Week 4  & thereafter: Take 1 tablet three times daily., Disp: 270 tablet, Rfl: 3   donepezil (ARICEPT) 5 MG tablet, Take 1 tablet (5 mg total) by mouth at bedtime., Disp: 30 tablet, Rfl: 11  erythromycin ophthalmic ointment, Place a 1/2 inch ribbon of ointment into the lower eyelid 4 times daily. (Patient not taking: Reported on 12/02/2022), Disp: 3.5 g, Rfl: 0   hydrochlorothiazide (HYDRODIURIL) 25 MG tablet, Take 25 mg by mouth at bedtime., Disp: , Rfl:    latanoprost (XALATAN) 0.005 % ophthalmic solution, Place 1 drop into both eyes at bedtime., Disp: , Rfl:    Multiple Vitamins-Minerals (MULTIVITAMIN MEN 50+) TABS, Take 1 tablet by mouth daily., Disp: , Rfl:    pravastatin (PRAVACHOL) 40 MG tablet, Take 1 tablet (40 mg total) by mouth daily., Disp: 90 tablet, Rfl: 3   propranolol (INDERAL) 20 MG tablet, Take 20 mg by mouth daily. Take 1-2 tablets daily as needed for trimmers (Patient not taking: Reported on 12/02/2022), Disp: , Rfl:    Semaglutide,0.25 or 0.5MG /DOS, (OZEMPIC, 0.25 OR 0.5 MG/DOSE,) 2 MG/3ML SOPN, Inject 0.5 mg into the skin once a week., Disp: , Rfl:    tadalafil (CIALIS) 20 MG tablet, Take 20 mg by mouth daily as needed for erectile dysfunction., Disp: , Rfl:   Clinical Interview:   The following information was obtained during a clinical interview with Mr. Agricola and his wife prior to cognitive testing.  Cognitive Symptoms: Decreased short-term memory: Endorsed. While he did acknowledge concerns, he also stated that he didn't have "great concerns" surrounding his overall functioning. His wife did express greater concerns, highlighting trouble recalling recent conversations, recalling names of familiar individuals, and entering rooms and forgetting his original intention. They both acknowledged some progressive decline relative to his previous evaluation in 2021. His wife again expressed more prominent  decline.  Decreased long-term memory: Denied. Decreased attention/concentration: Denied. His wife reported Mr. Sperle exhibiting notable trouble with sustained attention and distractibility Reduced processing speed: Endorsed. He reported feeling "a little foggy" at times. His wife did report ongoing difficulties with reduced processing speed.  Difficulties with executive functions: Denied. They also denied trouble with impulsivity. His wife denied personality changes whereas Mr. Heinl noted that he has felt "more mellowed out" lately.  Difficulties with emotion regulation: Denied. Difficulties with receptive language: Denied. Difficulties with word finding: Endorsed "sometimes." He and his wife also reported ongoing symptoms of hypophonia.  Decreased visuoperceptual ability: Denied.  Difficulties completing ADLs: Somewhat. He manages medications independently buy may forget an occasional dose from time to time. His wife manages finances and bill paying which is longstanding in nature. He drives locally without any issue. His wife prevents him from driving longer distances. He reported his perception that he should be able to drive without limitation.   Additional Medical History: History of traumatic brain injury/concussion: Denied. History of stroke: Prior neuroimaging in 2021 revealed remote bilateral centrum semiovale and right parietal cortex lacunar infarctions.  History of seizure activity: Denied. History of known exposure to toxins: Denied. Symptoms of chronic pain: Endorsed. He reported acute stomach pain and concerns for an underlying stomach ulcer. He noted being scheduled for an endoscopy in the near future to better assess these concerns.  Experience of frequent headaches/migraines: Denied.  Sensory changes: He has glaucoma in both eyes which can impact visual acuity. He reported being able to see adequately with the assistance of reading glasses. Other sensory  changes/difficulties (e.g., hearing, taste, smell) were denied.  Balance/coordination difficulties: He reported some mild balance instability at times but denied any tripping behaviors or recent falls. His wife noted that he has always ambulated relatively slowly and has not observed any prominent changes in his gait lately.  Other  motor difficulties: He reported a primarily resting tremor impacting his upper extremities, R > L. This was said to be present since 2020 and has somewhat worsened over time. He noted being prescribed medication to address tremors (i.e., carbidopa-levodopa) but self-discontinued this medication due to negative side effects (i.e., dizziness). He exhibited sialorrhea on the right side of his mouth, which is a more recently emerging symptom. It was unclear if this could be caused by some dental issues involving his lower teeth.   Sleep History: Estimated hours obtained each night: 7-8 hours.  Difficulties falling asleep: Denied. Difficulties staying asleep: Denied. Feels rested and refreshed upon awakening: Endorsed.  History of snoring: Endorsed. History of waking up gasping for air: Endorsed. Witnessed breath cessation while asleep: Endorsed. Medical records suggest a history of obstructive sleep apnea. His wife commented that she has witnessed Mr. Lafoon stop breathing while asleep and wake gasping for air. He does not utilize a CPAP machine and this condition appears untreated.   History of vivid dreaming: Endorsed. Excessive movement while asleep: Endorsed. Instances of acting out his dreams: Endorsed. He wife noted Mr. Thormahlen talking and yelling in his sleep at times, as well as times where he may hit, kick, or fight in his sleep. Experiences were said to be somewhat sporadic. He may go days or weeks without symptoms or have symptoms for several nights in a row. Symptoms have been present for the past 20 or so years.   Psychiatric/Behavioral Health  History: Depression: He described his current mood as "pretty upbeat" and denied to his knowledge any formal mental health concerns or diagnoses. Current or remote suicidal ideation, intent, or plan was denied.  Anxiety: Denied. Mania: Denied. Trauma History: Denied. Visual/auditory hallucinations: Denied. Delusional thoughts: Denied.  Tobacco: Denied. Alcohol: He denied current alcohol consumption as well as a history of problematic alcohol abuse or dependence.  Recreational drugs: Denied.  Family History: Problem Relation Age of Onset   Diabetes Mother    Diabetes Father    This information was confirmed by Mr. Douse.  Academic/Vocational History: Highest level of educational attainment: 15 years. He graduated from [employer] school, noting that he performed "pretty well" in academic settings "when I was there." He attended [employer] and studied Public relations account executive for a total of three years. He did not earn a formal degree. Separately, he did earn an Associate's degree from [employer] in installation and maintenance.  History of developmental delay: Denied. History of grade repetition: Denied. Enrollment in special education courses: Denied. History of LD/ADHD: Denied.  Employment: Retired. 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   Evaluation Results:   Behavioral Observations: Mr. Wibbenmeyer was accompanied by his wife, arrived to his appointment on time, and was appropriately dressed and groomed. He appeared alert and oriented. Observed gait and station were within normal limits but perhaps mildly bradykinetic. A prominent resting tremor was observed primarily in his right hand. There did appear to be some more mild tremors in his left hand. Sialorrhea was noted on the right side of his mouth, as well as mild  hypophonia. His affect was generally relaxed and positive. Spontaneous speech was fluent and word finding difficulties were not observed during the clinical interview. Thought processes were coherent, organized, and normal in content. Insight into his cognitive difficulties appeared adequate.   During testing, sustained  attention was appropriate. Task engagement was adequate and he persisted when challenged. Overall, Mr. Sheeks was cooperative with the clinical interview and subsequent testing procedures.   Adequacy of Effort: The validity of neuropsychological testing is limited by the extent to which the individual being tested may be assumed to have exerted adequate effort during testing. Mr. Litherland expressed his intention to perform to the best of his abilities and exhibited adequate task engagement and persistence. Scores across stand-alone and embedded performance validity measures were within expectation. As such, the results of the current evaluation are believed to be a valid representation of Mr. Capo' current cognitive functioning.  Test Results: Mr. Damp was fully oriented at the time of the current evaluation.  Intellectual abilities based upon educational and vocational attainment were estimated to be in the average range. Premorbid abilities were estimated to be within the average range based upon a single-word reading test.   Processing speed was largely exceptionally low. However, he did exhibit an average performance across a single rapid numerical sequencing task. Basic attention was below average. More complex attention (e.g., working memory) was average. Executive functioning was largely average. However, he did perform in the well below average range across a task assessing response inhibition. His performance was well above average across a task assessing safety and judgment.  While not directly assessed, receptive language abilities were believed to be intact.  Likewise, Mr. Weidert did not exhibit any difficulties comprehending task instructions and answered all questions asked of him appropriately. Assessed expressive language (e.g., verbal fluency and confrontation naming) was mildly variable but overall appropriate, ranging from the below average to above average normative ranges.     Assessed visuospatial/visuoconstructional abilities were mildly variable but overall appropriate, ranging from the below average to above average normative ranges.    Learning (i.e., encoding) of novel verbal and visual information was average. Spontaneous delayed recall (i.e., retrieval) of previously learned information was variable, ranging from the well below average to average normative ranges. Retention rates were 62% across a story learning task, 133% across a list learning task, and 100% across a shape learning task. Performance across recognition tasks was average to above average, suggesting evidence for information consolidation.   Results of emotional screening instruments suggested that recent symptoms of generalized anxiety were in the minimal range, while symptoms of depression were within the mild range. A screening instrument assessing recent sleep quality suggested the presence of minimal sleep dysfunction.  Tables of Scores:   Note: This summary of test scores accompanies the interpretive report and should not be considered in isolation without reference to the appropriate sections in the text. Descriptors are based on appropriate normative data and may be adjusted based on clinical judgment. Terms such as "Within Normal Limits" and "Outside Normal Limits" are used when a more specific description of the test score cannot be determined. Descriptors refer to the current evaluation only.        Percentile - Normative Descriptor > 98 - Exceptionally High 91-97 - Well Above Average 75-90 - Above Average 25-74 - Average 9-24 - Below Average 2-8 - Well  Below Average < 2 - Exceptionally Low        Validity: August 2021 Current  DESCRIPTOR        DCT: --- --- --- Within Normal Limits  NAB EVI: --- --- --- Within Normal Limits        Orientation:       Raw Score Raw Score Percentile   NAB Orientation, Form  1 --- 29/29 --- ---        Cognitive Screening:       Raw Score Raw Score Percentile   SLUMS: --- 24/30 --- ---        Intellectual Functioning:       Standard Score Standard Score Percentile   Test of Premorbid Functioning: --- 95 37 Average        Memory:      NAB Memory Module, Form 1: Standard Score/ T Score Standard Score/ T Score Percentile   Total Memory Index 89 94 34 Average  List Learning        Total Trials 1-3 17/36 (43) 22/36 (53) 62 Average    List B 4/12 (52) 5/12 (58) 79 Above Average    Short Delay Free Recall 4/12 (39) 3/12 (35) 7 Well Below Average    Long Delay Free Recall 3/12 (36) 4/12 (40) 16 Below Average    Retention Percentage 75 133 (62) 88 Above Average    Recognition Discriminability 6 6 (48) 42 Average  Shape Learning        Total Trials 1-3 18/27 (60) 16/27 (55) 69 Average    Delayed Recall 7/9 (62) 7/9 (62) 88 Above Average    Retention Percentage 100 100 (50) 50 Average    Recognition Discriminability 7 8 (57) 75 Above Average  Story Learning        Immediate Recall 48/80 (42) 50/80 (43) 25 Average    Delayed Recall 26/40 (44) 18/40 (35) 7 Well Below Average    Retention Percentage 93 62 (37) 9 Below Average  Daily Living Memory        Immediate Recall 34/51 (40) 43/51 (56) 73 Average    Delayed Recall 11/17 (43) 14/17 (52) 58 Average    Retention Percentage 79 88 (52) 58 Average    Recognition Hits 8/10 10/10 (61) 86 Above Average         Raw Score (Scaled Score) Raw Score (Scaled Score)    RBANS Figure Copy: 20/20 (14) --- --- ---  RBANS Figure Recall: 18/20 (14) --- --- ---        Attention/Executive Function:      Trail Making Test (TMT): T Score Raw Score (T Score)  Percentile     Part A 47 49 secs.,  0 errors (47) 38 Average    Part B 49 127 secs.,  1 error (49) 46 Average         Symbol Digit Modalities Test (SDMT): Raw Score (Z-Score) Raw Score (Z-Score) Percentile     Oral --- 14 (-2.87) <1 Exceptionally Low         Scaled Score Scaled Score Percentile   RBANS Coding: 3 --- --- ---        NAB Attention Module, Form 1: T Score T Score Percentile     Digits Forward 48 40 16 Below Average    Digits Backwards 49 49 46 Average         Scaled Score Scaled Score Percentile   WAIS-IV Similarities: --- 9 37 Average        D-KEFS Color-Word Interference Test: Raw Score (Scaled Score) Raw Score (Scaled Score) Percentile     Color Naming --- 55 secs. (1) <1 Exceptionally Low    Word Reading --- 40 secs. (3) 1 Exceptionally Low    Inhibition --- 110 secs. (4) 2 Well Below Average      Total Errors --- 4 errors (9) 37 Average    Inhibition/Switching ---  124 secs. (4) 2 Well Below Average      Total Errors --- 4 errors (9) 37 Average        D-KEFS Verbal Fluency Test: Raw Score (Scaled Score) Raw Score (Scaled Score) Percentile     Letter Total Correct --- 23 (6) 9 Below Average    Category Total Correct --- 28 (8) 25 Average    Category Switching Total Correct --- 10 (8) 25 Average    Category Switching Accuracy --- 9 (8) 25 Average      Total Set Loss Errors --- 0 (13) 84 Above Average      Total Repetition Errors --- 4 (9) 37 Average        NAB Executive Functions Module, Form 1: T Score T Score Percentile     Judgment --- 67 96 Well Above Average        Language:      Verbal Fluency Test: T Score Raw Score (T Score) Percentile     Phonemic Fluency (FAS) 41 23 (41) 18 Below Average    Animal Fluency 55 17 (55) 69 Average         NAB Language Module, Form 1: T Score T Score Percentile     Naming 30/31 (57) 30/31 (57) 75 Above Average        Visuospatial/Visuoconstruction:       Raw Score Raw Score Percentile   Clock Drawing: 8/10  8/10 --- Within Normal Limits  RBANS Line Orientation: 20/20 --- --- ---        NAB Spatial Module, Form 1: T Score T Score Percentile     Visual Discrimination --- 49 46 Average    Figure Drawing Copy --- 62 88 Above Average         Scaled Score Scaled Score Percentile   WAIS-IV Visual Puzzles: --- 6 9 Below Average        Mood and Personality:       Raw Score Raw Score Percentile   PROMIS Depression Questionnaire: --- 18 --- Mild  PROMIS Anxiety Questionnaire: --- 9 --- None to Slight        Additional Questionnaires:       Raw Score Raw Score Percentile   PROMIS Sleep Disturbance Questionnaire: --- 19 --- None to Slight   Informed Consent and Coding/Compliance:   The current evaluation represents a clinical evaluation for the purposes previously outlined by the referral source and is in no way reflective of a forensic evaluation.   Mr. Westland was provided with a verbal description of the nature and purpose of the present neuropsychological evaluation. Also reviewed were the foreseeable risks and/or discomforts and benefits of the procedure, limits of confidentiality, and mandatory reporting requirements of this provider. The patient was given the opportunity to ask questions and receive answers about the evaluation. Oral consent to participate was provided by the patient.   This evaluation was conducted by Newman Nickels, Ph.D., ABPP-CN, board certified clinical neuropsychologist. Mr. Luikart completed a clinical interview with Dr. Milbert Coulter, billed as one unit [phone], and 150 minutes of cognitive testing and scoring, billed as one unit [phone] and four additional units [phone]. Psychometrist Wallace Keller, B.S. assisted Dr. Milbert Coulter with test administration and scoring procedures. As a separate and discrete service, one unit [id number] and two units [phone] were billed for Dr. Tammy Sours time spent in interpretation and report writing.

## 2023-05-21 NOTE — Progress Notes (Signed)
   Psychometrician Note   Cognitive testing was administered to [employer] by Wallace Keller, B.S. (psychometrist) under the supervision of Dr. Newman Nickels, Ph.D., licensed psychologist on 05/21/2023. Mr. Curro did not appear overtly distressed by the testing session per behavioral observation or responses across self-report questionnaires. Rest breaks were offered.    The battery of tests administered was selected by Dr. Newman Nickels, Ph.D. with consideration to Mr. Cressey current level of functioning, the nature of his symptoms, emotional and behavioral responses during interview, level of literacy, observed level of motivation/effort, and the nature of the referral question. This battery was communicated to the psychometrist. Communication between Dr. Newman Nickels, Ph.D. and the psychometrist was ongoing throughout the evaluation and Dr. Newman Nickels, Ph.D. was immediately accessible at all times. Dr. Newman Nickels, Ph.D. provided supervision to the psychometrist on the date of this service to the extent necessary to assure the quality of all services provided.    [name] will return within approximately 1-2 weeks for an interactive feedback session with Dr. Milbert Coulter at which time his test performances, clinical impressions, and treatment recommendations will be reviewed in detail. Mr. Sivers understands he can contact our office should he require our assistance before this time.  A total of 150 minutes of billable time were spent face-to-face with Mr. Falkowitz by the psychometrist. This includes both test administration and scoring time. Billing for these services is reflected in the clinical report generated by Dr. Newman Nickels, Ph.D.  This note reflects time spent with the psychometrician and does not include test scores or any clinical interpretations made by Dr. Milbert Coulter. The full report will follow in a separate note.

## 2023-05-26 ENCOUNTER — Ambulatory Visit: Payer: Medicare Other | Admitting: Podiatry

## 2023-05-26 DIAGNOSIS — B351 Tinea unguium: Secondary | ICD-10-CM

## 2023-05-26 DIAGNOSIS — M79675 Pain in left toe(s): Secondary | ICD-10-CM | POA: Diagnosis not present

## 2023-05-26 DIAGNOSIS — M79674 Pain in right toe(s): Secondary | ICD-10-CM | POA: Diagnosis not present

## 2023-05-26 NOTE — Progress Notes (Signed)
    Subjective:  Patient ID: Joseph Lamb, male    DOB: March 28, 1951,  MRN: [id number]  Joseph Lamb presents to clinic today for:  Chief Complaint  Patient presents with   Nail Problem    [facility] LBS this am was 144 Last HgA1c 08/13/2423 was 6.9 Dr. Lupita Raider, MD is his primary care doctor Last visit with Dr. Clelia Croft 02/17/23  . Patient notes nails are thick, discolored, elongated and painful in shoegear when trying to ambulate.    PCP is Lupita Raider, MD.  Past Medical History:  Diagnosis Date   Adverse effect of antihyperlipidemic and antiarteriosclerotic drugs, initial encounter 10/11/2021   Arrhythmia    Atrial fibrillation 10/11/2021   Cerebrovascular disease    moderate per 2021 MRI   Cholecystitis 09/01/2021   Chronic kidney disease, stage 3 unspecified 10/11/2021   Dizzy spells 03/08/2020   Erectile dysfunction 10/11/2021   Essential hypertension 06/14/2015   Glaucoma 08/31/2021   Hyperlipidemia 06/14/2015   Mild cognitive impairment of uncertain or unknown etiology 05/21/2023   Multiple lacunar infarcts    bilateral centrum semiovale and right parietal cortex.   Obesity 10/11/2021   Obstructive sleep apnea    no CPAP   Primary open angle glaucoma (POAG) of left eye, moderate stage 10/11/2021   Primary open-angle glaucoma, right eye, mild stage 10/11/2021   Renal insufficiency 09/06/2022   Sinus bradycardia 03/08/2020   Tremor 10/11/2021   Type II diabetes mellitus 10/11/2021   Allergies  Allergen Reactions   Lisinopril Cough   Rosuvastatin     Other reaction(s): fatigue   Telmisartan     Other reaction(s): decreased kidney functions   Crestor [Rosuvastatin Calcium]     " fatigue"    Simvastatin Other (See Comments)    "fatigue"   Review of Systems: Negative except as noted in the HPI.  Objective:  There were no vitals filed for this visit.  Joseph Lamb is a pleasant 72 y.o. male in NAD. AAO x 3.  Vascular Examination: Capillary  refill time is 3-5 seconds to toes bilateral. Palpable pedal pulses b/l LE. Digital hair present b/l.  Skin temperature gradient WNL b/l. No varicosities b/l. No cyanosis noted b/l.   Dermatological Examination: Pedal skin with normal turgor, texture and tone b/l. No open wounds. No interdigital macerations b/l. Toenails x10 are 3mm thick, discolored, dystrophic with subungual debris. There is pain with compression of the nail plates.  They are elongated x10  Assessment/Plan: 1. Pain due to onychomycosis of toenails of both feet    The mycotic toenails were sharply debrided x10 with sterile nail nippers and a power debriding burr to decrease bulk/thickness and length.    Return in about 3 months (around 08/26/2023) for [facility].   Clerance Lav, DPM, FACFAS [facility]     [address] [place], [place] [address]                Office [phone]  Fax [phone]

## 2023-05-28 ENCOUNTER — Ambulatory Visit (INDEPENDENT_AMBULATORY_CARE_PROVIDER_SITE_OTHER): Payer: Medicare Other | Admitting: Psychology

## 2023-05-28 DIAGNOSIS — R251 Tremor, unspecified: Secondary | ICD-10-CM

## 2023-05-28 DIAGNOSIS — I679 Cerebrovascular disease, unspecified: Secondary | ICD-10-CM

## 2023-05-28 DIAGNOSIS — G3184 Mild cognitive impairment, so stated: Secondary | ICD-10-CM

## 2023-05-28 DIAGNOSIS — I6381 Other cerebral infarction due to occlusion or stenosis of small artery: Secondary | ICD-10-CM

## 2023-05-28 NOTE — Progress Notes (Signed)
   Neuropsychology Feedback Session Eligha Bridegroom. [facility] Department of Neurology  Reason for Referral:   ESVIN GAYMON is a 72 y.o. right-handed African-American male referred by Marlowe Kays, PA-C, to characterize his current cognitive functioning and assist with diagnostic clarity and treatment planning in the context of a prior mild neurocognitive disorder diagnosis and concerns for progressive decline.   Feedback:   Mr. Imbert completed a comprehensive neuropsychological evaluation on 05/21/2023. Please refer to that encounter for the full report and recommendations. Briefly, results suggested a primary impairment surrounding processing speed. An isolated impairment was also exhibited across response inhibition, while further performance variability was exhibited across delayed retrieval aspects of memory. Relative to his previous neuropsychological evaluation in August 2021, Mr. Whalley exhibited a very large degree of stability. The etiology for his mild neurocognitive disorder presentation is unclear and may be mixed. Dr. Roseanne Reno previously theorized a primary vascular cause, likely given prior neuroimaging (2021) suggesting moderate microvascular ischemic disease which had progressed over the years, as well as lacunar infarctions in the bilateral centrum semiovale and right parietal cortex. This continues to be a reasonable conceptualization as current testing patterns, as well as general stability over time, align well with a primary vascular cause for dysfunction. Despite this, I do feel that concerns surrounding Parkinson's disease should be further explored. Mr. Bickhart has exhibited progressively worsening upper extremity resting tremors, predominantly impacting his right hand. Mr. Mcelhone also currently exhibited hypophonia, sialorrhea on the right side of his mouth, longstanding REM sleep behaviors, and perhaps a mildly bradykinetic gait. Testing patterns,  especially a primary impairment across processing speed (i.e., bradyphrenia) also align with what can be seen in Parkinson's disease reasonably well. Further diagnostic work up would be beneficial to achieve greater clarity in this regard.   Mr. Ledin was accompanied by his wife during the current feedback session. Content of the current session focused on the results of his neuropsychological evaluation. Mr. Kouba was given the opportunity to ask questions and his questions were answered. He was encouraged to reach out should additional questions arise. A copy of his report was provided at the conclusion of the visit.      One unit [phone] was billed for Dr. Tammy Sours time spent preparing for, conducting, and documenting the current feedback session with Mr. Alsept.

## 2023-06-03 ENCOUNTER — Telehealth: Payer: Self-pay | Admitting: Neurology

## 2023-06-03 NOTE — Telephone Encounter (Signed)
I did not cal this new patient I am not sure if he missed a call from the automated service

## 2023-06-03 NOTE — Telephone Encounter (Signed)
Patient called stating that he had just received a missed call, I didn't see anything in notes to relay back to patient

## 2023-06-04 ENCOUNTER — Ambulatory Visit: Payer: Medicare Other | Admitting: Physician Assistant

## 2023-06-17 NOTE — Progress Notes (Unsigned)
Assessment/Plan:   1.  Idiopathic Parkinson's disease, moderate  -Diagnosis was recently made, but likely has had this for a number of years  -We discussed the diagnosis as well as pathophysiology of the disease.  We discussed treatment options as well as prognostic indicators.  Patient education was provided.  -We discussed that it used to be thought that levodopa would increase risk of melanoma but now it is believed that Parkinsons itself likely increases risk of melanoma. he is to get regular skin checks.  -Greater than 50% of the 60 minute visit was spent in counseling answering questions and talking about what to expect now as well as in the future.  We talked about medication options as well as potential future surgical options.  He asked about focused ultrasound specifically.  We talked about safety in the home.  -He was given levodopa previously.  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  He thought it caused his heart to flutter, and I think that would be very unlikely.  He does not disagree that this may not have been the case.  We decided to restart carbidopa levodopa.  He will take carbidopa/levodopa 25/100.  1/2 tab tid x 1 wk, then 1/2 in am & noon & 1 at night for a week, then 1/2 in am &1 at noon &night for a week, then 1 po tid.  Risks, benefits, side effects and alternative therapies were discussed.  The opportunity to ask questions was given and they were answered to the best of my ability.  The patient expressed understanding and willingness to follow the outlined treatment protocols.  -I will refer the patient to the Parkinson's program at the [facility], for PT/OT and ST.  We talked about the importance of safe, cardiovascular exercise in Parkinson's disease.  -We discussed community resources in the area including patient support groups and community exercise programs for PD and pt  education was provided to the patient.   2.  Memory change  -Neurocognitive testing in February, 2024 with evidence of mild cognitive impairment.  This was largely stable compared to prior testing with Dr. Roseanne Reno in 2021.  -Dr. Milbert Coulter noted that the patient was on donepezil, but felt that the medication was not necessary currently.  3.  Sialorrhea  -This is commonly associated with PD.  We talked about treatments.  The patient is not a candidate for oral anticholinergic therapy because of increased risk of confusion and falls.  We discussed Botox (type A and B) and 1% atropine drops.  We discusssed that candy like lemon drops can help by stimulating mm of the oropharynx to induce swallowing.  He wants to hold on this for now.  He is carrying nodule cough in the room.  4.  Mood change  -Wife asks about lorazepam, but I do not recommend this regularly in a Parkinson's population due to fall risk.  -declines SSRI   Subjective:   Joseph Lamb was seen today in the movement disorders clinic for neurologic consultation at the request of Rosann Auerbach, PhD.  The consultation is for the evaluation of tremor and Parkinson's.  Pt with wife who supplements hx.  Patient has been seen in our clinic for a long time.  He was first seen by Dr. Karel Jarvis in 2016 and Dr. Marjory Lies prior to that.  Initial evaluation for his further evaluation of memory change, but at that point in time his MoCA was  29/30.  He came back for further evaluation in 2021.  In the June, 2021 evaluation, it is noted that patient had begun to have hand tremors for 5 months prior.  At that point in time, Dr. Karel Jarvis did not see much on the examination, however.  By August, 2022, Dr. Karel Jarvis did note occasional right thumb resting tremor and mild cogwheeling in the office.  The following visits were with Marlowe Kays.  With time, she began to note right greater than left tremors at rest, mouth tremor, sialorrhea and increased tone.  He was  started on levodopa by her in February, 2024.  He apparently discontinued it due to the fact that he felt his heart was fluttering and he d/c the medication.  .he thinks that he took it for a months.  He states that mostly he noted no difference in tremor so he d/c it.  he saw Dr. Milbert Coulter for memory testing in August, 2024, demonstrating mild cognitive impairment.  Dr. Milbert Coulter referred him to me for further evaluation.   Specific Symptoms:  Tremor: Yes.  ; started in the R hand and now some in the L.  None in the legs.  He is R hand dominant Family hx of similar:  Yes.   Mother with tremor in late years but no fam hx of Parkinsons Disease  Voice: softer Sleep:   Vivid Dreams:  No.  Acting out dreams:  Yes.  , "fighting and talking" - no falling out of the be Wet Pillows: Yes.   Postural symptoms:  "I steady myself"  Falls?  One in which "dog knocked me over"; one near fall and tried to step over flower bed and nearly fell Bradykinesia symptoms: shuffling gait, slow movements, and difficulty getting out of a chair; R foot drags Loss of smell:  Yes.   Loss of taste:  Yes.   Urinary Incontinence:  only one episode Difficulty Swallowing:  No. Handwriting, micrographia: Yes.  , "chicken scratch" Trouble with ADL's:  he is able to do it but he is slower  Trouble buttoning clothing: Yes.   Depression:  "its melancholy" Memory changes:  Yes.   Hallucinations:  No.  visual distortions: No. N/V:  No. Lightheaded:  he denies but wife thinks so  Syncope: No. Diplopia:  No. Dyskinesia:  No. Prior exposure to reglan/antipsychotics: No.  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   hydrochlorothiazide (HYDRODIURIL) 25 MG tablet Take 25 mg by mouth at bedtime.   latanoprost (XALATAN) 0.005 % ophthalmic solution Place 1 drop into both eyes at bedtime.   Multiple Vitamins-Minerals (MULTIVITAMIN MEN 50+) TABS Take 1 tablet by mouth daily.   pravastatin (PRAVACHOL) 40 MG tablet Take 1 tablet (40 mg total) by mouth daily.   Semaglutide,0.25 or 0.5MG /DOS, (OZEMPIC, 0.25 OR 0.5 MG/DOSE,) 2 MG/3ML SOPN Inject 0.5 mg into the skin once a week.  Objective:   VITALS:   Vitals:   06/19/23 0928  BP: 118/72  Pulse: 81  SpO2: 97%  Weight: 210 lb 9.6 oz (95.5 kg)  Height: 6' (1.829 m)    GEN:  The patient appears stated age and is in NAD. HEENT:  Normocephalic, atraumatic.  The mucous membranes are moist. The superficial temporal arteries are without ropiness or tenderness. CV:  RRR Lungs:  CTAB Neck/HEME:  There are no carotid bruits bilaterally.  Neurological examination:  Orientation: The patient is alert and oriented x3.  Cranial nerves: There is good facial symmetry. Extraocular muscles are intact. The visual fields are full to confrontational testing. The speech is fluent and clear. Soft palate rises symmetrically and there is no tongue deviation. Hearing is intact to conversational tone. Sensation: Sensation is intact to light and pinprick throughout (facial, trunk, extremities). Vibration is intact at the bilateral big toe. There is no extinction with double simultaneous stimulation. There is no sensory dermatomal level identified. Motor: Strength is 5/5 in the bilateral upper and lower extremities.   Shoulder shrug is equal and symmetric.  There is no pronator drift.   Movement examination: Tone: There is mild to mod increased tone in the bilateral UE Abnormal  movements: mod rest tremor on the L; has bilateral UE tremor with ambulation Coordination:  There is  decremation with RAM's, with any form of RAMS, including alternating supination and pronation of the forearm, hand opening and closing, finger taps, L>R.  There is no decremation in the LE bilateral.  Gait and Station: The patient has no difficulty arising out of a deep-seated chair without the use of the hands. The patient's stride length is just slightly decreased with bilateral UE rest tremor. I have reviewed and interpreted the following labs independently   Chemistry      Component Value Date/Time   NA 140 09/02/2021 0328   K 3.8 09/02/2021 0328   CL 106 09/02/2021 0328   CO2 29 09/02/2021 0328   BUN 14 09/02/2021 0328   CREATININE 1.47 (H) 09/02/2021 0328      Component Value Date/Time   CALCIUM 8.6 (L) 09/02/2021 0328   ALKPHOS 75 09/02/2021 0328   AST 37 09/02/2021 0328   ALT 53 (H) 09/02/2021 0328   BILITOT 0.8 09/02/2021 0328      Lab Results  Component Value Date   TSH 3.440 07/24/2017   Lab Results  Component Value Date   WBC 11.1 (H) 09/02/2021   HGB 13.7 09/02/2021   HCT 42.5 09/02/2021   MCV 86.2 09/02/2021   PLT 189 09/02/2021     Total time spent on today's visit was 65 minutes, including both face-to-face time and nonface-to-face time.  Time included that spent on review of records (prior notes available to me/labs/imaging if pertinent), discussing treatment and goals, answering patient's questions and coordinating care.  Cc:  Lupita Raider, MD

## 2023-06-19 ENCOUNTER — Ambulatory Visit: Payer: Medicare Other | Admitting: Neurology

## 2023-06-19 ENCOUNTER — Encounter: Payer: Self-pay | Admitting: Neurology

## 2023-06-19 VITALS — BP 118/72 | HR 81 | Ht 72.0 in | Wt 210.6 lb

## 2023-06-19 DIAGNOSIS — G20A1 Parkinson's disease without dyskinesia, without mention of fluctuations: Secondary | ICD-10-CM | POA: Diagnosis not present

## 2023-06-19 DIAGNOSIS — K117 Disturbances of salivary secretion: Secondary | ICD-10-CM | POA: Diagnosis not present

## 2023-06-19 NOTE — Patient Instructions (Signed)
Start Carbidopa Levodopa as follows: Take 1/2 tablet three times daily, at least 30 minutes before meals (approximately 7am/11am/4pm), for one week Then take 1/2 tablet in the morning, 1/2 tablet in the afternoon, 1 tablet in the evening, at least 30 minutes before meals, for one week Then take 1/2 tablet in the morning, 1 tablet in the afternoon, 1 tablet in the evening, at least 30 minutes before meals, for one week Then take 1 tablet three times daily at 7am/11am/4pm, at least 30 minutes before meals   As a reminder, carbidopa/levodopa can be taken at the same time as a carbohydrate, but we like to have you take your pill either 30 minutes before a protein source or 1 hour after as protein can interfere with carbidopa/levodopa absorption.  SAVE THE DATE!  We are planning a Parkinsons Disease educational symposium at [facility] in [place] on October 11.  More details to come!  If you would like to be added to our email list to get further information, email [email or web address]  To sign up, you can email [email or web address].  We hope to see you there!

## 2023-07-08 NOTE — Therapy (Signed)
OUTPATIENT PHYSICAL THERAPY NEURO EVALUATION   Patient Name: Joseph Lamb MRN: [id number] DOB:[birth year], 72 y.o., male Today's Date: 07/09/2023   PCP: Lupita Raider, MD  REFERRING PROVIDER:  Vladimir Faster, DO  END OF SESSION:  PT End of Session - 07/09/23 1136     Visit Number 1    Number of Visits 13    Date for PT Re-Evaluation 09/07/23    Authorization Type [employer] Medicare    PT Start Time 1018    PT Stop Time 1102    PT Time Calculation (min) 44 min    Equipment Utilized During Treatment Gait belt    Activity Tolerance Patient tolerated treatment well    Behavior During Therapy WFL for tasks assessed/performed             Past Medical History:  Diagnosis Date   Adverse effect of antihyperlipidemic and antiarteriosclerotic drugs, initial encounter 10/11/2021   Arrhythmia    Atrial fibrillation 10/11/2021   Cerebrovascular disease    moderate per 2021 MRI   Cholecystitis 09/01/2021   Chronic kidney disease, stage 3 unspecified 10/11/2021   Dizzy spells 03/08/2020   Erectile dysfunction 10/11/2021   Essential hypertension 06/14/2015   Glaucoma 08/31/2021   Hyperlipidemia 06/14/2015   Mild cognitive impairment of uncertain or unknown etiology 05/21/2023   Multiple lacunar infarcts    bilateral centrum semiovale and right parietal cortex.   Obesity 10/11/2021   Obstructive sleep apnea    no CPAP   Primary open angle glaucoma (POAG) of left eye, moderate stage 10/11/2021   Primary open-angle glaucoma, right eye, mild stage 10/11/2021   Renal insufficiency 09/06/2022   Sinus bradycardia 03/08/2020   Tremor 10/11/2021   Type II diabetes mellitus 10/11/2021   Past Surgical History:  Procedure Laterality Date   CHOLECYSTECTOMY N/A 08/31/2021   Procedure: LAPAROSCOPIC CHOLECYSTECTOMY;  Surgeon: Stechschulte, Hyman Hopes, MD;  Location: [facility] OR;  Service: General;  Laterality: N/A;   EYE SURGERY     Glaucoma surgery   PROSTATE SURGERY     Patient Active  Problem List   Diagnosis Date Noted   Mild cognitive impairment of uncertain or unknown etiology 05/21/2023   Obstructive sleep apnea    Multiple lacunar infarcts    Cerebrovascular disease    Renal insufficiency 09/06/2022   Atrial fibrillation 10/11/2021   Chronic kidney disease, stage 3 unspecified 10/11/2021   Obesity 10/11/2021   Erectile dysfunction 10/11/2021   Primary open angle glaucoma (POAG) of left eye, moderate stage 10/11/2021   Primary open-angle glaucoma, right eye, mild stage 10/11/2021   Tremor 10/11/2021   Type II diabetes mellitus 10/11/2021   Cholecystitis 09/01/2021   Glaucoma 08/31/2021   Arrhythmia    Dizzy spells 03/08/2020   Sinus bradycardia 03/08/2020   Essential hypertension 06/14/2015   Hyperlipidemia 06/14/2015    ONSET DATE: 06/19/2023  REFERRING DIAG: G20.A1 (ICD-10-CM) - Parkinson's disease without dyskinesia or fluctuating manifestations (HCC)   THERAPY DIAG:  Unsteadiness on feet  Other abnormalities of gait and mobility  Unspecified lack of coordination  Other symptoms and signs involving the nervous system  Rationale for Evaluation and Treatment: Rehabilitation  SUBJECTIVE:  SUBJECTIVE STATEMENT: Saw Dr. Arbutus Leas recently and was formally diagnosed with PD. Titrating his Sinemet and almost up to 1 pill 3 times a day. Notes that stiffness in his neck have felt better since taking the medication. Notes tremors are worse when he has to do some deep thinking. Feels unsteady on his feet when he is walking and sometimes will shuffle his steps. Denies freezing episodes. Pt is R handed. No falls. Has to gather himself in the morning before he gets out of bed. No lightheadedness. Has difficulty with buttoning his shirt, tying his shoes, getting out of the bathtub,  opening boxes. Fine motor/dexterity/coordination tasks are harder.   Pt accompanied by:  Spouse, [name]  PERTINENT HISTORY: PMH: PD,  A fb, stage 3 CKD, HLD, HTN, multiple lacunar infarcts, obesity, type II diabetes    Per Dr. Arbutus Leas:  Idiopathic Parkinson's disease, moderate             -Diagnosis was recently made, but likely has had this for a number of years  PAIN:  Are you having pain? Feels pins and needles in R arm.  Soreness in his stomach (after getting his gallbladder out 2 years ago), has been seeing the gastro doctor for this   Vitals:   07/09/23 1035  BP: 128/62  Pulse: (!) 50     PRECAUTIONS: Fall  RED FLAGS: None   WEIGHT BEARING RESTRICTIONS: No  FALLS: Has patient fallen in last 6 months? No  LIVING ENVIRONMENT: Lives with: lives with their spouse Lives in: House/apartment Stairs: Yes: External: 3 steps; bilateral but cannot reach both Has following equipment at home: None Has trouble getting up from sitting in the bathtub   PLOF: Independent Sometimes needs help with buttoning   PATIENT GOALS: Slow down the progression, maintain as much as possible.   OBJECTIVE:  Note: Objective measures were completed at Evaluation unless otherwise noted.  COGNITION: Overall cognitive status: Within functional limits for tasks assessed and Impaired Neurocognitive testing in February, 2024 with evidence of mild cognitive impairment. This was largely stable compared to prior testing with Dr. Roseanne Reno in 2021.    SENSATION: Light touch: WFL Pt reports sometimes numbness/tingling when getting up in the morning   COORDINATION: Heel to shin: WNL   MUSCLE TONE: Noted more stiffness in LLE    POSTURE: rounded shoulders and forward head  RUE resting tremors   LOWER EXTREMITY ROM:     Noted slightly decr R knee extension AROM   LOWER EXTREMITY MMT:    MMT Right Eval Left Eval  Hip flexion 5 5  Hip extension    Hip abduction    Hip adduction    Hip  internal rotation    Hip external rotation    Knee flexion 5 5  Knee extension 5 5  Ankle dorsiflexion 5 5  Ankle plantarflexion    Ankle inversion    Ankle eversion    (Blank rows = not tested)  BED MOBILITY:  Pt reports no difficulty   TRANSFERS: Assistive device utilized: None  Sit to stand: SBA Stand to sit: SBA Performs with no UE support   Pt notes difficulty getting in and out of the car, with bringing the legs in.   Pt reports difficulty getting up off the couch, sometimes will take a couple of tries.   GAIT: Gait pattern: step through pattern, decreased arm swing- Right, decreased arm swing- Left, decreased stride length, Right foot flat, Left foot flat, shuffling, and decreased trunk rotation Distance walked:  Clinic distances  Assistive device utilized: None Level of assistance: SBA   FUNCTIONAL TESTS:  5 times sit to stand: 14 seconds with no UE support  10 meter walk test: 10.91 seconds = 3.01 ft/sec    [facility] PT Assessment - 07/09/23 1042       Standardized Balance Assessment   Standardized Balance Assessment Mini-BESTest;Timed Up and Go Test      Mini-BESTest   Sit To Stand Normal: Comes to stand without use of hands and stabilizes independently.    Rise to Toes Normal: Stable for 3 s with maximum height.    Stand on one leg (left) Moderate: < 20 s   9.4, 1.5   Stand on one leg (right) Moderate: < 20 s   5.5, 5.4   Stand on one leg - lowest score 1    Compensatory Stepping Correction - Forward Moderate: More than one step is required to recover equilibrium   2 steps   Compensatory Stepping Correction - Backward No step, OR would fall if not caught, OR falls spontaneously.   ~4-5 steps, with min A from therapist for balance   Compensatory Stepping Correction - Left Lateral Normal: Recovers independently with 1 step (crossover or lateral OK)    Compensatory Stepping Correction - Right Lateral Moderate: Several steps to recover equilibrium   2 steps    Stepping Corredtion Lateral - lowest score 1    Stance - Feet together, eyes open, firm surface  Normal: 30s    Stance - Feet together, eyes closed, foam surface  Moderate: < 30s   incr postural sway   Incline - Eyes Closed Normal: Stands independently 30s and aligns with gravity    Change in Gait Speed Moderate: Unable to change walking speed or signs of imbalance    Walk with head turns - Horizontal Moderate: performs head turns with reduction in gait speed.    Walk with pivot turns Moderate:Turns with feet close SLOW (>4 steps) with good balance.    Step over obstacles Moderate: Steps over box but touches box OR displays cautious behavior by slowing gait.    Timed UP & GO with Dual Task Moderate: Dual Task affects either counting OR walking (>10%) when compared to the TUG without Dual Task.    Mini-BEST total score 17      Timed Up and Go Test   Normal TUG (seconds) 11.1    Manual TUG (seconds) 8    Cognitive TUG (seconds) 15.2              TODAY'S TREATMENT:                                                                                                                              N/A during eval.    PATIENT EDUCATION: Education details: Clinical findings, POC, PD education, purpose of PT/OT Person educated: Patient and Spouse Education method: Explanation Education comprehension: verbalized understanding  HOME EXERCISE PROGRAM: Will provide  at future session   GOALS: Goals reviewed with patient? Yes  SHORT TERM GOALS:  Target date: 08/06/2023  Pt will be independent with initial HEP for PD specific deficits in order to build upon functional gains made in therapy. Baseline: Goal status: INITIAL  2.  Pt will perform posterior stepping strategy in 3 steps or less in order to demo improved balance strategies.  Baseline: at least 5 steps, min A for balance  Goal status: INITIAL  3.  Pt will improve 5x sit<>stand to less than or equal to 12 sec to demonstrate improved  functional strength and transfer efficiency.   Baseline: 14 seconds with no UE support Goal status: INITIAL   LONG TERM GOALS: Target date: 09/03/2023   Pt will be independent with final  HEP for PD specific deficits in order to build upon functional gains made in therapy. Baseline:  Goal status: INITIAL  2.  Pt will improve miniBEST to at least a 22/28 in order to demo decr fall risk Baseline: 17/28 Goal status: INITIAL  3.  Pt will verbalize understanding of local Parkinson's disease resources, including options for continue community fitness.  Baseline:  Goal status: INITIAL  4.  Pt will improve TUG and TUG cog score to less than or equal to 10% difference for improved dual task/decreased fall risk.  Baseline:  Goal status: INITIAL  5.  Pt will get up from lower/softer surfaces to mimic a couch/recliner mod I in order to demo improved transfers  Baseline:  Goal status: INITIAL    ASSESSMENT:  CLINICAL IMPRESSION: Patient is a 72 year old male referred to Neuro OPPT for PD.   Pt's PMH is significant for:  PD,  A fb, stage 3 CKD, HLD, HTN, multiple lacunar infarcts, obesity, type II diabetes. The following deficits were present during the exam: impaired balance, gait abnormalities, impaired timing/coordination of gait, decr functional strength, bradykinesia, postural instability, RUE tremors. Based on miniBEST, pt is an incr risk for falls. Pt would benefit from skilled PT to address these impairments and functional limitations to maximize functional mobility independence   OBJECTIVE IMPAIRMENTS: Abnormal gait, decreased activity tolerance, decreased balance, decreased cognition, decreased coordination, decreased mobility, difficulty walking, decreased strength, impaired flexibility, impaired UE functional use, and postural dysfunction.   ACTIVITY LIMITATIONS: carrying, bending, stairs, transfers, dressing, and locomotion level  PARTICIPATION LIMITATIONS: community  activity  PERSONAL FACTORS: Age, Behavior pattern, Past/current experiences, Time since onset of injury/illness/exacerbation, and 3+ comorbidities:  PD,  A fb, stage 3 CKD, HLD, HTN, multiple lacunar infarcts, obesity, type II diabetes   are also affecting patient's functional outcome.   REHAB POTENTIAL: Good  CLINICAL DECISION MAKING: Evolving/moderate complexity  EVALUATION COMPLEXITY: Moderate  PLAN:  PT FREQUENCY: 2x/week  PT DURATION: 8 weeks  PLANNED INTERVENTIONS: Therapeutic exercises, Therapeutic activity, Neuromuscular re-education, Balance training, Gait training, Patient/Family education, Self Care, Joint mobilization, Stair training, DME instructions, and Re-evaluation  PLAN FOR NEXT SESSION: Initial HEP - standing PWR moves, work on SLS, stepping strategies, larger amplitude movement patterns    Clytee Heinrich N Nishanth Mccaughan, PT,DPT 07/09/2023, 11:38 AM

## 2023-07-09 ENCOUNTER — Ambulatory Visit: Payer: Medicare Other | Admitting: Speech Pathology

## 2023-07-09 ENCOUNTER — Encounter: Payer: Self-pay | Admitting: Physical Therapy

## 2023-07-09 ENCOUNTER — Ambulatory Visit: Payer: Medicare Other | Attending: Neurology | Admitting: Physical Therapy

## 2023-07-09 ENCOUNTER — Encounter: Payer: Self-pay | Admitting: Speech Pathology

## 2023-07-09 ENCOUNTER — Ambulatory Visit: Payer: Medicare Other | Admitting: Occupational Therapy

## 2023-07-09 ENCOUNTER — Other Ambulatory Visit: Payer: Self-pay

## 2023-07-09 VITALS — BP 128/62 | HR 50

## 2023-07-09 DIAGNOSIS — R278 Other lack of coordination: Secondary | ICD-10-CM | POA: Insufficient documentation

## 2023-07-09 DIAGNOSIS — R2689 Other abnormalities of gait and mobility: Secondary | ICD-10-CM | POA: Insufficient documentation

## 2023-07-09 DIAGNOSIS — R471 Dysarthria and anarthria: Secondary | ICD-10-CM | POA: Diagnosis present

## 2023-07-09 DIAGNOSIS — G20A1 Parkinson's disease without dyskinesia, without mention of fluctuations: Secondary | ICD-10-CM | POA: Diagnosis not present

## 2023-07-09 DIAGNOSIS — R251 Tremor, unspecified: Secondary | ICD-10-CM | POA: Diagnosis present

## 2023-07-09 DIAGNOSIS — R279 Unspecified lack of coordination: Secondary | ICD-10-CM | POA: Insufficient documentation

## 2023-07-09 DIAGNOSIS — R2681 Unsteadiness on feet: Secondary | ICD-10-CM | POA: Insufficient documentation

## 2023-07-09 DIAGNOSIS — M6281 Muscle weakness (generalized): Secondary | ICD-10-CM | POA: Diagnosis present

## 2023-07-09 DIAGNOSIS — R29898 Other symptoms and signs involving the musculoskeletal system: Secondary | ICD-10-CM | POA: Diagnosis present

## 2023-07-09 DIAGNOSIS — R29818 Other symptoms and signs involving the nervous system: Secondary | ICD-10-CM | POA: Diagnosis present

## 2023-07-09 NOTE — Patient Instructions (Addendum)
  [email or web address]  Under Education and Training - watch Learn about Parkinson's video with [name]  Have your sons watch the video as well  You will be completing this program from here on out  Swallow more frequently throughout the day - swallow each time you want to wipe  Watch online daily practice on the website as well to see what you will be doing

## 2023-07-09 NOTE — Therapy (Signed)
OUTPATIENT SPEECH LANGUAGE PATHOLOGY PARKINSON'S EVALUATION   Patient Name: Joseph Lamb MRN: [id number] DOB:04-Jun-1951, 72 y.o., male Today's Date: 07/09/2023  PCP: Lupita Raider MD REFERRING PROVIDER: Vladimir Faster, DO  END OF SESSION:  End of Session - 07/09/23 1531     Visit Number 1    Number of Visits 17    Date for SLP Re-Evaluation 09/03/23    SLP Start Time 0930    SLP Stop Time  1015    SLP Time Calculation (min) 45 min    Activity Tolerance Patient tolerated treatment well             Past Medical History:  Diagnosis Date   Adverse effect of antihyperlipidemic and antiarteriosclerotic drugs, initial encounter 10/11/2021   Arrhythmia    Atrial fibrillation 10/11/2021   Cerebrovascular disease    moderate per 2021 MRI   Cholecystitis 09/01/2021   Chronic kidney disease, stage 3 unspecified 10/11/2021   Dizzy spells 03/08/2020   Erectile dysfunction 10/11/2021   Essential hypertension 06/14/2015   Glaucoma 08/31/2021   Hyperlipidemia 06/14/2015   Mild cognitive impairment of uncertain or unknown etiology 05/21/2023   Multiple lacunar infarcts    bilateral centrum semiovale and right parietal cortex.   Obesity 10/11/2021   Obstructive sleep apnea    no CPAP   Primary open angle glaucoma (POAG) of left eye, moderate stage 10/11/2021   Primary open-angle glaucoma, right eye, mild stage 10/11/2021   Renal insufficiency 09/06/2022   Sinus bradycardia 03/08/2020   Tremor 10/11/2021   Type II diabetes mellitus 10/11/2021   Past Surgical History:  Procedure Laterality Date   CHOLECYSTECTOMY N/A 08/31/2021   Procedure: LAPAROSCOPIC CHOLECYSTECTOMY;  Surgeon: Stechschulte, Hyman Hopes, MD;  Location: [facility] OR;  Service: General;  Laterality: N/A;   EYE SURGERY     Glaucoma surgery   PROSTATE SURGERY     Patient Active Problem List   Diagnosis Date Noted   Mild cognitive impairment of uncertain or unknown etiology 05/21/2023   Obstructive sleep apnea     Multiple lacunar infarcts    Cerebrovascular disease    Renal insufficiency 09/06/2022   Atrial fibrillation 10/11/2021   Chronic kidney disease, stage 3 unspecified 10/11/2021   Obesity 10/11/2021   Erectile dysfunction 10/11/2021   Primary open angle glaucoma (POAG) of left eye, moderate stage 10/11/2021   Primary open-angle glaucoma, right eye, mild stage 10/11/2021   Tremor 10/11/2021   Type II diabetes mellitus 10/11/2021   Cholecystitis 09/01/2021   Glaucoma 08/31/2021   Arrhythmia    Dizzy spells 03/08/2020   Sinus bradycardia 03/08/2020   Essential hypertension 06/14/2015   Hyperlipidemia 06/14/2015    ONSET DATE: 06/19/2023(referral date)  REFERRING DIAG:  Diagnosis  G20.A1 (ICD-10-CM) - Parkinson's disease 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 Don Perking consult: Joseph Lamb was seen in the movement disorders clinic for neurologic consultation at the request of Rosann Auerbach, PhD.  The consultation is for the evaluation of tremor and Parkinson's.  Pt with wife who supplements hx.  Patient has been seen in our clinic for a long time.  He was first seen by Dr. Karel Jarvis in 2016 and Dr. Marjory Lies prior to that.  Initial evaluation for his further evaluation of memory change, but at that point in time his MoCA was  29/30.  He came back for further evaluation in 2021.  In the June, 2021 evaluation, it is noted that patient had begun to have hand tremors for 5 months prior.  At that point in time, Dr. Karel Jarvis did not see much on the examination, however.  By August, 2022, Dr. Karel Jarvis did note occasional right thumb resting tremor and mild cogwheeling in the office.  The following visits were with Marlowe Kays.  With time, she began to note right  greater than left tremors at rest, mouth tremor, sialorrhea and increased tone.  He was started on levodopa by her in February, 2024.  He apparently discontinued it due to the fact that he felt his heart was fluttering and he d/c the medication.  .he thinks that he took it for a months.  He states that mostly he noted no difference in tremor so he d/c it.  he saw Dr. Milbert Coulter for memory testing in August, 2024, demonstrating mild cognitive impairment.  PAIN:  Are you having pain? No  FALLS: Has patient fallen in last 6 months?  See PT evaluation for details  LIVING ENVIRONMENT: Lives with: lives with their spouse and lives with their son Lives in: House/apartment  PLOF:  Level of assistance: Independent with ADLs, Independent with IADLs Employment: Retired  PATIENT GOALS: "To talk better"  OBJECTIVE:  Note: Objective measures were completed at Evaluation unless otherwise noted.  COGNITION: Overall cognitive status: Impaired Areas of impairment: Memory Comments: MCI per neuropsych, slow processing  MOTOR SPEECH: Overall motor speech: impaired Level of impairment: Sentence Respiration: thoracic breathing Phonation: low vocal intensity Resonance: WFL Articulation: Appears intact Intelligibility: Intelligibility reduced Motor planning: Appears intact Motor speech errors: unaware Interfering components:  n/a Effective technique: increased vocal intensity  ORAL MOTOR EXAMINATION: Overall status: WFL Comments: n/a   OBJECTIVE VOICE ASSESSMENT: Sustained "ah" maximum phonation time: 16.71 seconds Sustained "ah" loudness average: 72 dB Oral reading (passage) loudness average: 70 dB Oral reading loudness range: 69-74 dB Conversational loudness average: 66 dB Conversational loudness range: 61-67 dB Voice quality: breathy and low vocal intensity Stimulability trials: Given SLP modeling and usual min cues, loudness average increased to 72dB (range of 70 to 74) at  sentence level and  loud Ah increased to 85dB Comments: Joseph Lamb carried over speaking with intent into conversation by the end of the sessions  Completed audio recording of patients baseline voice without cueing from SLP: No  Pt does not report difficulty with swallowing which does not warrant further evaluation.  PATIENT REPORTED OUTCOME MEASURES (PROM): Communication Effectiveness Survey: 19/32 - Rated by spouse. Joseph Lamb rated a 2, or less effective communication on conversing with strangers, conversing over the phone, conversing in noisy environment, traveling in a car, and conversing at a distance  TODAY'S TREATMENT:  DATE:   07/09/23 (eval day): Introduced [employer]! Workbook to [employer]. Ordered work book - Introduced them to [employer], demonstrated location of Learn about [employer] and on Physiological scientist. Instructed them to watch webinar and an online practice session and have their 2 sons watch the webinar as well. Educated re: Automatic vs Intentional motor systems. Joseph Lamb Id'd when he was speaking or reading with intent vs not using intent. By the end of the session, he was conversing with intent to average 70dB with occasional min a  PATIENT EDUCATION: Education details: See Today's Treatment; See patient instructions; HEP for dysarthria; PD education Person educated: Patient and Spouse Education method: Explanation, Demonstration, Verbal cues, and Handouts Education comprehension: verbalized understanding, returned demonstration, verbal cues required, and needs further education  HOME EXERCISE PROGRAM: Speak Out! Workbook ordered - they are to bring it to ST when it arrives   GOALS: Goals reviewed with patient? Yes  SHORT TERM GOALS: Target date: 08/06/23  Pt will complete HEP hitting target dB's with occasional min A over 1  week Baseline: Goal status: INITIAL  2.  Pt will complete HEP twice a day, completing at least 1 on line session with Parkinson Voice [employer] website Baseline:  Goal status: INITIAL  3.  Pt will averge 72dB 18/20 sentences with rare min A Baseline:  Goal status: INITIAL  4.  Pt will average 72dB over 12 minute converation with rare min A Baseline:  Goal status: INITIAL  5.  Pt and his family will watch Learn about Parkinson's webinar on [employer] website Baseline:  Goal status: INITIAL              LONG TERM GOALS: Target date: 09/03/23  Pt will complete HEP for dysarthria twice daily accurately over 1 week with mod I Baseline:  Goal status: INITIAL  2.  Pt will complete 2 on line practice sessions from [employer] online Baseline:  Goal status: INITIAL  3.  Pt will maintain 70dB over 15 minute conversation with mod I Baseline:  Goal status: INITIAL  4.  Pt will improve score on Communicative Effectiveness Survey by 3 points Baseline: 19 - spouse rated due to reduced pt awareness Goal status: INITIAL  5.  Pt/spouse will verbalize requirement to continue Speak Out! Daily practice with workbook and online up d/c of ST Baseline:  Goal status: INITIAL   ASSESSMENT:  CLINICAL IMPRESSION: Patient is a 72 y.o. 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 He is accompanied by his spouse, Joseph Lamb. Joseph Lamb verbalizes reduced awareness of low volume, stating his spouse needs a hearing aid. He does endorse he is "a little low" at times. Joseph Lamb reports frequent requests for repetition and difficulty hearing Joseph Lamb. They deny difficulty swallowing, coughing or getting choked or strangled. Meds are swallowed without difficulty. They report some memory issues, such as walking into a room and forgetting why, however they both feel that Joseph Lamb' current cognitive function is acceptable for day to day activities. Joseph Lamb' volume improved  significantly after initial training for speak with intent and he demonstrated carry over during simple short conversation at the end of the evaluation. I recommend skilled ST to maximize intelligibility for independence, QOL, reduce caregiver burden.   OBJECTIVE IMPAIRMENTS: Objective impairments include memory and dysarthria. These impairments are limiting patient from effectively communicating at home and in community.Factors affecting potential to achieve goals and functional outcome are medical prognosis.. Patient will benefit from skilled  SLP services to address above impairments and improve overall function.  REHAB POTENTIAL: Good  PLAN:  SLP FREQUENCY: 2x/week  SLP DURATION: 8 weeks  PLANNED INTERVENTIONS: Aspiration precaution training, Diet toleration management , Environmental controls, Cueing hierachy, Cognitive reorganization, Internal/external aids, Functional tasks, Multimodal communication approach, and SLP instruction and feedback MBSS    Reeva Davern, Radene Journey, CCC-SLP 07/09/2023, 3:41 PM

## 2023-07-23 ENCOUNTER — Ambulatory Visit: Payer: Medicare Other | Admitting: Speech Pathology

## 2023-07-23 ENCOUNTER — Encounter: Payer: Self-pay | Admitting: Physical Therapy

## 2023-07-23 ENCOUNTER — Ambulatory Visit: Payer: Medicare Other | Admitting: Physical Therapy

## 2023-07-23 ENCOUNTER — Encounter: Payer: Self-pay | Admitting: Speech Pathology

## 2023-07-23 VITALS — BP 126/66 | HR 73

## 2023-07-23 DIAGNOSIS — R2689 Other abnormalities of gait and mobility: Secondary | ICD-10-CM

## 2023-07-23 DIAGNOSIS — R471 Dysarthria and anarthria: Secondary | ICD-10-CM

## 2023-07-23 DIAGNOSIS — R2681 Unsteadiness on feet: Secondary | ICD-10-CM | POA: Diagnosis not present

## 2023-07-23 DIAGNOSIS — R29818 Other symptoms and signs involving the nervous system: Secondary | ICD-10-CM

## 2023-07-23 NOTE — Therapy (Signed)
OUTPATIENT SPEECH LANGUAGE PATHOLOGY PARKINSON'S TREATMENT   Patient Name: Joseph Lamb MRN: [id number] DOB:November 23, 1950, 72 y.o., male Today's Date: 07/23/2023  PCP: Lupita Raider MD REFERRING PROVIDER: Vladimir Faster, DO  END OF SESSION:  End of Session - 07/23/23 1314     Visit Number 2    Number of Visits 17    Date for SLP Re-Evaluation 09/03/23              Past Medical History:  Diagnosis Date   Adverse effect of antihyperlipidemic and antiarteriosclerotic drugs, initial encounter 10/11/2021   Arrhythmia    Atrial fibrillation 10/11/2021   Cerebrovascular disease    moderate per 2021 MRI   Cholecystitis 09/01/2021   Chronic kidney disease, stage 3 unspecified 10/11/2021   Dizzy spells 03/08/2020   Erectile dysfunction 10/11/2021   Essential hypertension 06/14/2015   Glaucoma 08/31/2021   Hyperlipidemia 06/14/2015   Mild cognitive impairment of uncertain or unknown etiology 05/21/2023   Multiple lacunar infarcts    bilateral centrum semiovale and right parietal cortex.   Obesity 10/11/2021   Obstructive sleep apnea    no CPAP   Primary open angle glaucoma (POAG) of left eye, moderate stage 10/11/2021   Primary open-angle glaucoma, right eye, mild stage 10/11/2021   Renal insufficiency 09/06/2022   Sinus bradycardia 03/08/2020   Tremor 10/11/2021   Type II diabetes mellitus 10/11/2021   Past Surgical History:  Procedure Laterality Date   CHOLECYSTECTOMY N/A 08/31/2021   Procedure: LAPAROSCOPIC CHOLECYSTECTOMY;  Surgeon: Stechschulte, Hyman Hopes, MD;  Location: [facility] OR;  Service: General;  Laterality: N/A;   EYE SURGERY     Glaucoma surgery   PROSTATE SURGERY     Patient Active Problem List   Diagnosis Date Noted   Mild cognitive impairment of uncertain or unknown etiology 05/21/2023   Obstructive sleep apnea    Multiple lacunar infarcts    Cerebrovascular disease    Renal insufficiency 09/06/2022   Atrial fibrillation 10/11/2021   Chronic kidney  disease, stage 3 unspecified 10/11/2021   Obesity 10/11/2021   Erectile dysfunction 10/11/2021   Primary open angle glaucoma (POAG) of left eye, moderate stage 10/11/2021   Primary open-angle glaucoma, right eye, mild stage 10/11/2021   Tremor 10/11/2021   Type II diabetes mellitus 10/11/2021   Cholecystitis 09/01/2021   Glaucoma 08/31/2021   Arrhythmia    Dizzy spells 03/08/2020   Sinus bradycardia 03/08/2020   Essential hypertension 06/14/2015   Hyperlipidemia 06/14/2015    ONSET DATE: 06/19/2023(referral date)  REFERRING DIAG:  Diagnosis  G20.A1 (ICD-10-CM) - Parkinson's disease 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 Don Perking consult: [name] was seen in the movement disorders clinic for neurologic consultation at the request of Rosann Auerbach, PhD.  The consultation is for the evaluation of tremor and Parkinson's.  Pt with wife who supplements hx.  Patient has been seen in our clinic for a long time.  He was first seen by Dr. Karel Jarvis in 2016 and Dr. Marjory Lies prior to that.  Initial evaluation for his further evaluation of memory change, but at that point in time his MoCA was 29/30.  He came back for further evaluation in 2021.  In the June, 2021 evaluation, it is noted that patient had begun to have hand tremors for 5 months prior.  At that point in time, Dr. Karel Jarvis did not see much on the examination, however.  By August, 2022, Dr. Karel Jarvis did note occasional right thumb resting tremor and mild cogwheeling in the office.  The following visits were with Marlowe Kays.  With time, she began to note right greater than left tremors at rest, mouth tremor, sialorrhea and increased tone.  He was started on levodopa by her in February, 2024.  He  apparently discontinued it due to the fact that he felt his heart was fluttering and he d/c the medication.  .he thinks that he took it for a months.  He states that mostly he noted no difference in tremor so he d/c it.  he saw Dr. Milbert Coulter for memory testing in August, 2024, demonstrating mild cognitive impairment.  PAIN:  Are you having pain? No  FALLS: Has patient fallen in last 6 months?  See PT evaluation for details  LIVING ENVIRONMENT: Lives with: lives with their spouse and lives with their son Lives in: House/apartment  PLOF:  Level of assistance: Independent with ADLs, Independent with IADLs Employment: Retired  PATIENT GOALS: "To talk better"  OBJECTIVE:  Note: Objective measures were completed at Evaluation unless otherwise noted.  COGNITION: Overall cognitive status: Impaired Areas of impairment: Memory Comments: MCI per neuropsych, slow processing  MOTOR SPEECH: Overall motor speech: impaired Level of impairment: Sentence Respiration: thoracic breathing Phonation: low vocal intensity Resonance: WFL Articulation: Appears intact Intelligibility: Intelligibility reduced Motor planning: Appears intact Motor speech errors: unaware Interfering components:  n/a Effective technique: increased vocal intensity  ORAL MOTOR EXAMINATION: Overall status: WFL Comments: n/a   OBJECTIVE VOICE ASSESSMENT: Sustained "ah" maximum phonation time: 16.71 seconds Sustained "ah" loudness average: 72 dB Oral reading (passage) loudness average: 70 dB Oral reading loudness range: 69-74 dB Conversational loudness average: 66 dB Conversational loudness range: 61-67 dB Voice quality: breathy and low vocal intensity Stimulability trials: Given SLP modeling and usual min cues, loudness average increased to 72dB (range of 70 to 74) at  sentence level and loud Ah increased to 85dB Comments: Joseph Lamb carried over speaking with intent into conversation by the end of the sessions  Completed  audio recording of patients baseline voice without cueing from SLP: No  Pt does not report difficulty with swallowing which does not warrant further evaluation.  PATIENT REPORTED OUTCOME MEASURES (PROM): Communication Effectiveness Survey: 19/32 - Rated by spouse. Joseph Lamb rated a 2, or less effective communication on conversing with strangers, conversing over the phone, conversing in noisy environment, traveling in a car, and conversing at a distance  TODAY'S TREATMENT:                                                                                                                                         DATE:    07/23/23: Joseph Lamb brings Speak Out! Workbook to the session. He and Joseph Lamb have watched the Learn About Parkinson's  webinar on [employer]. Instructed them to have their son watch it as well. Joseph Lamb endorses "I have been speaking low for a long time, I didn't realize it"  Previewed the workbook with them, introduced requirement of twice daily practice, advancing lessons after 2 sessions. Demonstrated online Speak Out! Sessions. Targeted volume and intelligibility using Speak Out! Lesson 1. Pt required usual to  occasional min verbal cues and modeling, for volume and breath support.  Pt averages the following volume levels  Sustained AH: 85dB  Counting:85dB  Reading (phrases): 75dB  Cognitive Exercise: 72dB Required usual mod verbal cues, modeling for carryover of intent /volume generating 3 sentence descriptions of basic objects.  following structured practice. In simple conversation over 5 minutes, Joseph Lamb averaged 70dB with usual mod visual and verbal cues.    07/09/23 (eval day): Introduced [employer]! Workbook to [employer]. Ordered work book - Introduced them to [employer], demonstrated location of Learn about [employer] and on Physiological scientist. Instructed them to watch webinar and an online practice session and have their 2 sons watch  the webinar as well. Educated re: Automatic vs Intentional motor systems. [name] Id'd when he was speaking or reading with intent vs not using intent. By the end of the session, he was conversing with intent to average 70dB with occasional min a  PATIENT EDUCATION: Education details: See Today's Treatment; See patient instructions; HEP for dysarthria; PD education Person educated: Patient and Spouse Education method: Explanation, Demonstration, Verbal cues, and Handouts Education comprehension: verbalized understanding, returned demonstration, verbal cues required, and needs further education  HOME EXERCISE PROGRAM: Speak Out! Workbook ordered - they are to bring it to ST when it arrives   GOALS: Goals reviewed with patient? Yes  SHORT TERM GOALS: Target date: 08/06/23  Pt will complete HEP hitting target dB's with occasional min A over 1 week Baseline: Goal status: INITIAL  2.  Pt will complete HEP twice a day, completing at least 1 on line session with [facility] website Baseline:  Goal status: INITIAL  3.  Pt will averge 72dB 18/20 sentences with rare min A Baseline:  Goal status: INITIAL  4.  Pt will average 72dB over 12 minute converation with rare min A Baseline:  Goal status: INITIAL  5.  Pt and his family will watch Learn about Parkinson's webinar on [facility] website Baseline:  Goal status: INITIAL              LONG TERM GOALS: Target date: 09/03/23  Pt will complete HEP for dysarthria twice daily accurately over 1 week with mod I Baseline:  Goal status: INITIAL  2.  Pt will complete 2 on line practice sessions from [employer] online Baseline:  Goal status: INITIAL  3.  Pt will maintain 70dB over 15 minute conversation with mod I Baseline:  Goal status: INITIAL  4.  Pt will improve score on Communicative Effectiveness Survey by 3 points Baseline: 19 - spouse rated due to reduced pt awareness Goal status:  INITIAL  5.  Pt/spouse will verbalize requirement to continue Speak Out! Daily practice with workbook and online up d/c of ST Baseline:  Goal status: INITIAL   ASSESSMENT:  CLINICAL IMPRESSION: Patient is a 72 y.o. 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 He is accompanied by his spouse, Joseph Lamb. Antoneo verbalizes reduced awareness of low volume, stating his spouse needs a hearing aid. He does endorse he is "a  little low" at times. Joseph Lamb reports frequent requests for repetition and difficulty hearing Joseph Lamb. They deny difficulty swallowing, coughing or getting choked or strangled. Meds are swallowed without difficulty. They report some memory issues, such as walking into a room and forgetting why, however they both feel that Joseph Lamb' current cognitive function is acceptable for day to day activities. Wentworth' volume improved significantly after initial training for speak with intent and he demonstrated carry over during simple short conversation at the end of the evaluation. I recommend skilled ST to maximize intelligibility for independence, QOL, reduce caregiver burden.   OBJECTIVE IMPAIRMENTS: Objective impairments include memory and dysarthria. These impairments are limiting patient from effectively communicating at home and in community.Factors affecting potential to achieve goals and functional outcome are medical prognosis.. Patient will benefit from skilled SLP services to address above impairments and improve overall function.  REHAB POTENTIAL: Good  PLAN:  SLP FREQUENCY: 2x/week  SLP DURATION: 8 weeks  PLANNED INTERVENTIONS: Aspiration precaution training, Diet toleration management , Environmental controls, Cueing hierachy, Cognitive reorganization, Internal/external aids, Functional tasks, Multimodal communication approach, and SLP instruction and feedback MBSS    Aviv Lengacher, Radene Journey, CCC-SLP 07/23/2023, 1:14 PM

## 2023-07-23 NOTE — Therapy (Signed)
OUTPATIENT PHYSICAL THERAPY NEURO TREATMENT   Patient Name: Joseph Lamb MRN: [id number] DOB:Feb 26, 1951, 72 y.o., male Today's Date: 07/23/2023   PCP: Joseph Raider, MD  REFERRING PROVIDER:  Vladimir Faster, DO  END OF SESSION:  PT End of Session - 07/23/23 0934     Visit Number 2    Number of Visits 13    Date for PT Re-Evaluation 09/07/23    Authorization Type [employer] Medicare    PT Start Time 0933    PT Stop Time 1013    PT Time Calculation (min) 40 min    Equipment Utilized During Treatment --    Activity Tolerance Patient tolerated treatment well    Behavior During Therapy [facility] for tasks assessed/performed             Past Medical History:  Diagnosis Date   Adverse effect of antihyperlipidemic and antiarteriosclerotic drugs, initial encounter 10/11/2021   Arrhythmia    Atrial fibrillation 10/11/2021   Cerebrovascular disease    moderate per 2021 MRI   Cholecystitis 09/01/2021   Chronic kidney disease, stage 3 unspecified 10/11/2021   Dizzy spells 03/08/2020   Erectile dysfunction 10/11/2021   Essential hypertension 06/14/2015   Glaucoma 08/31/2021   Hyperlipidemia 06/14/2015   Mild cognitive impairment of uncertain or unknown etiology 05/21/2023   Multiple lacunar infarcts    bilateral centrum semiovale and right parietal cortex.   Obesity 10/11/2021   Obstructive sleep apnea    no CPAP   Primary open angle glaucoma (POAG) of left eye, moderate stage 10/11/2021   Primary open-angle glaucoma, right eye, mild stage 10/11/2021   Renal insufficiency 09/06/2022   Sinus bradycardia 03/08/2020   Tremor 10/11/2021   Type II diabetes mellitus 10/11/2021   Past Surgical History:  Procedure Laterality Date   CHOLECYSTECTOMY N/A 08/31/2021   Procedure: LAPAROSCOPIC CHOLECYSTECTOMY;  Surgeon: Lamb, Joseph Hopes, MD;  Location: [facility] OR;  Service: General;  Laterality: N/A;   EYE SURGERY     Glaucoma surgery   PROSTATE SURGERY     Patient Active Problem List    Diagnosis Date Noted   Mild cognitive impairment of uncertain or unknown etiology 05/21/2023   Obstructive sleep apnea    Multiple lacunar infarcts    Cerebrovascular disease    Renal insufficiency 09/06/2022   Atrial fibrillation 10/11/2021   Chronic kidney disease, stage 3 unspecified 10/11/2021   Obesity 10/11/2021   Erectile dysfunction 10/11/2021   Primary open angle glaucoma (POAG) of left eye, moderate stage 10/11/2021   Primary open-angle glaucoma, right eye, mild stage 10/11/2021   Tremor 10/11/2021   Type II diabetes mellitus 10/11/2021   Cholecystitis 09/01/2021   Glaucoma 08/31/2021   Arrhythmia    Dizzy spells 03/08/2020   Sinus bradycardia 03/08/2020   Essential hypertension 06/14/2015   Hyperlipidemia 06/14/2015    ONSET DATE: 06/19/2023  REFERRING DIAG: G20.A1 (ICD-10-CM) - Parkinson's disease without dyskinesia or fluctuating manifestations (HCC)   THERAPY DIAG:  Unsteadiness on feet  Other abnormalities of gait and mobility  Other symptoms and signs involving the nervous system  Rationale for Evaluation and Treatment: Rehabilitation  SUBJECTIVE:  SUBJECTIVE STATEMENT: No changes, no falls. Nothing new. Sinemet is making him less stiff.   Pt accompanied by:  Spouse, Joseph Lamb  PERTINENT HISTORY: PMH: PD,  A fb, stage 3 CKD, HLD, HTN, multiple lacunar infarcts, obesity, type II diabetes    Per Dr. Arbutus Lamb:  Idiopathic Parkinson's disease, moderate             -Diagnosis was recently made, but likely has had this for a number of years  PAIN:  Are you having pain? No  Vitals:   07/23/23 0939  BP: 126/66  Pulse: 73    PRECAUTIONS: Fall  RED FLAGS: None   WEIGHT BEARING RESTRICTIONS: No  FALLS: Has patient fallen in last 6 months? No  LIVING  ENVIRONMENT: Lives with: lives with their spouse Lives in: House/apartment Stairs: Yes: External: 3 steps; bilateral but cannot reach both Has following equipment at home: None Has trouble getting up from sitting in the bathtub   PLOF: Independent Sometimes needs help with buttoning   PATIENT GOALS: Slow down the progression, maintain as much as possible.   OBJECTIVE:  Note: Objective measures were completed at Evaluation unless otherwise noted.  COGNITION: Overall cognitive status: Within functional limits for tasks assessed and Impaired Neurocognitive testing in February, 2024 with evidence of mild cognitive impairment. This was largely stable compared to prior testing with Dr. Roseanne Lamb in 2021.    SENSATION: Light touch: WFL Pt reports sometimes numbness/tingling when getting up in the morning   COORDINATION: Heel to shin: WNL   MUSCLE TONE: Noted more stiffness in LLE    POSTURE: rounded shoulders and forward head  RUE resting tremors   LOWER EXTREMITY ROM:     Noted slightly decr R knee extension AROM   LOWER EXTREMITY MMT:    MMT Right Eval Left Eval  Hip flexion 5 5  Hip extension    Hip abduction    Hip adduction    Hip internal rotation    Hip external rotation    Knee flexion 5 5  Knee extension 5 5  Ankle dorsiflexion 5 5  Ankle plantarflexion    Ankle inversion    Ankle eversion    (Blank rows = not tested)  BED MOBILITY:  Pt reports no difficulty   TRANSFERS: Assistive device utilized: None  Sit to stand: SBA Stand to sit: SBA Performs with no UE support   Pt notes difficulty getting in and out of the car, with bringing the legs in.   Pt reports difficulty getting up off the couch, sometimes will take a couple of tries.    FUNCTIONAL TESTS:  5 times sit to stand: 14 seconds with no UE support  10 meter walk test: 10.91 seconds = 3.01 ft/sec     TODAY'S TREATMENT:  Therapeutic Exercise:  SciFit with BUE/BLE at [facility] Gear 3.0 for 8 minutes for neural priming, reciprocal movement patterns, incr amplitude of stepping, activity tolerance. Pt reporting RPE at 8/10 during the hills   NMR Pt performs PWR! Moves in standing position    PWR! Up for improved posture 20 reps, initial cues for opening up hands and extending R>L elbow  PWR! Rock for improved weight shifting 20 reps, initial cues to look up at hands  PWR! Twist for improved trunk rotation 10 reps   PWR! Step for improved step initiation 2 x 10 reps, cues for foot clearance esp with RLE - chair in front of pt as needed for balance  Cues provided for technique, larger amplitude, and relation to function   Pt reporting 7-8/10 level of effort and reporting feeling less stiff afterwards     GAIT: Gait pattern: step through pattern, decreased arm swing- Right, decreased arm swing- Left, decreased stride length, Right foot flat, Left foot flat, shuffling, and decreased trunk rotation Distance walked: 230' 1, plus additional clinic distances  Assistive device utilized: None Level of assistance: cued for larger steps, arm swing and purpose of bigger movements during gait with PD   Pt responded well with cues for arm swing, and able to demonstrate improved RUE arm swing with gait.  Carried this gait pattern over to speech therapy   PATIENT EDUCATION: Education details: Standing PWR moves for HEP, purpose of larger amplitude movements with PD  Person educated: Patient and Spouse Education method: Programmer, multimedia, Facilities manager, Verbal cues, and Handouts Education comprehension: verbalized understanding, returned demonstration, and needs further education  HOME EXERCISE PROGRAM: Standing PWR moves   GOALS: Goals reviewed with patient? Yes  SHORT TERM GOALS:  Target date: 08/06/2023  Pt will be independent with initial HEP for PD  specific deficits in order to build upon functional gains made in therapy. Baseline: Goal status: INITIAL  2.  Pt will perform posterior stepping strategy in 3 steps or less in order to demo improved balance strategies.  Baseline: at least 5 steps, min A for balance  Goal status: INITIAL  3.  Pt will improve 5x sit<>stand to less than or equal to 12 sec to demonstrate improved functional strength and transfer efficiency.   Baseline: 14 seconds with no UE support Goal status: INITIAL   LONG TERM GOALS: Target date: 09/03/2023   Pt will be independent with final  HEP for PD specific deficits in order to build upon functional gains made in therapy. Baseline:  Goal status: INITIAL  2.  Pt will improve miniBEST to at least a 22/28 in order to demo decr fall risk Baseline: 17/28 Goal status: INITIAL  3.  Pt will verbalize understanding of local Parkinson's disease resources, including options for continue community fitness.  Baseline:  Goal status: INITIAL  4.  Pt will improve TUG and TUG cog score to less than or equal to 10% difference for improved dual task/decreased fall risk.  Baseline:  Goal status: INITIAL  5.  Pt will get up from lower/softer surfaces to mimic a couch/recliner mod I in order to demo improved transfers  Baseline:  Goal status: INITIAL    ASSESSMENT:  CLINICAL IMPRESSION: Today's skilled session focused on working on larger amplitude movements for PD with initiating session on SciFit, standing PWR moves, and gait with arm swing. Pt rating RPE as 7-8/10 with standing PWR moves, with initial cues for R elbow extension and opening up right hand. Pt initially more challenged with foot  clearance when stepping with PWR Step, but improved with incr reps. Worked on gait with arm swing as initially RUE not swinging during gait, pt able to maintain arm swing with gait. Will continue per POC.    OBJECTIVE IMPAIRMENTS: Abnormal gait, decreased activity tolerance,  decreased balance, decreased cognition, decreased coordination, decreased mobility, difficulty walking, decreased strength, impaired flexibility, impaired UE functional use, and postural dysfunction.   ACTIVITY LIMITATIONS: carrying, bending, stairs, transfers, dressing, and locomotion level  PARTICIPATION LIMITATIONS: community activity  PERSONAL FACTORS: Age, Behavior pattern, Past/current experiences, Time since onset of injury/illness/exacerbation, and 3+ comorbidities:  PD,  A fb, stage 3 CKD, HLD, HTN, multiple lacunar infarcts, obesity, type II diabetes   are also affecting patient's functional outcome.   REHAB POTENTIAL: Good  CLINICAL DECISION MAKING: Evolving/moderate complexity  EVALUATION COMPLEXITY: Moderate  PLAN:  PT FREQUENCY: 2x/week  PT DURATION: 8 weeks  PLANNED INTERVENTIONS: Therapeutic exercises, Therapeutic activity, Neuromuscular re-education, Balance training, Gait training, Patient/Family education, Self Care, Joint mobilization, Stair training, DME instructions, and Re-evaluation  PLAN FOR NEXT SESSION: review standing PWR moves as needed, work on SLS, stepping strategies, larger amplitude movement patterns . SciFit for reciprocal movement, endurance. Gait exercises with arm swing   Orvil Faraone N Kwali Wrinkle, PT,DPT 07/23/2023, 10:14 AM

## 2023-07-23 NOTE — Patient Instructions (Signed)
   The on line speech classes are at 11:00 am, but you can always follow along with a past class  [email or web address] - Our Program-On line Practice

## 2023-07-28 ENCOUNTER — Ambulatory Visit: Payer: Medicare Other | Admitting: Occupational Therapy

## 2023-07-28 ENCOUNTER — Encounter: Payer: Self-pay | Admitting: Physical Therapy

## 2023-07-28 ENCOUNTER — Encounter: Payer: Self-pay | Admitting: Occupational Therapy

## 2023-07-28 ENCOUNTER — Ambulatory Visit: Payer: Medicare Other | Admitting: Physical Therapy

## 2023-07-28 ENCOUNTER — Ambulatory Visit: Payer: Medicare Other | Admitting: Speech Pathology

## 2023-07-28 ENCOUNTER — Encounter: Payer: Self-pay | Admitting: Speech Pathology

## 2023-07-28 VITALS — BP 117/62 | HR 57

## 2023-07-28 DIAGNOSIS — R2689 Other abnormalities of gait and mobility: Secondary | ICD-10-CM

## 2023-07-28 DIAGNOSIS — R29818 Other symptoms and signs involving the nervous system: Secondary | ICD-10-CM

## 2023-07-28 DIAGNOSIS — M6281 Muscle weakness (generalized): Secondary | ICD-10-CM

## 2023-07-28 DIAGNOSIS — R29898 Other symptoms and signs involving the musculoskeletal system: Secondary | ICD-10-CM

## 2023-07-28 DIAGNOSIS — R2681 Unsteadiness on feet: Secondary | ICD-10-CM

## 2023-07-28 DIAGNOSIS — R471 Dysarthria and anarthria: Secondary | ICD-10-CM

## 2023-07-28 DIAGNOSIS — R251 Tremor, unspecified: Secondary | ICD-10-CM

## 2023-07-28 DIAGNOSIS — R278 Other lack of coordination: Secondary | ICD-10-CM

## 2023-07-28 NOTE — Therapy (Signed)
OUTPATIENT PHYSICAL THERAPY NEURO TREATMENT   Patient Name: Joseph Lamb MRN: [id number] DOB:Feb 06, 1951, 72 y.o., male Today's Date: 07/28/2023   PCP: Lupita Raider, MD  REFERRING PROVIDER:  Vladimir Faster, DO  END OF SESSION:  PT End of Session - 07/28/23 0938     Visit Number 3    Number of Visits 13    Date for PT Re-Evaluation 09/07/23    Authorization Type [employer] Medicare    PT Start Time 0935    PT Stop Time 1015    PT Time Calculation (min) 40 min    Equipment Utilized During Treatment Gait belt    Activity Tolerance Patient tolerated treatment well    Behavior During Therapy [facility] for tasks assessed/performed             Past Medical History:  Diagnosis Date   Adverse effect of antihyperlipidemic and antiarteriosclerotic drugs, initial encounter 10/11/2021   Arrhythmia    Atrial fibrillation 10/11/2021   Cerebrovascular disease    moderate per 2021 MRI   Cholecystitis 09/01/2021   Chronic kidney disease, stage 3 unspecified 10/11/2021   Dizzy spells 03/08/2020   Erectile dysfunction 10/11/2021   Essential hypertension 06/14/2015   Glaucoma 08/31/2021   Hyperlipidemia 06/14/2015   Mild cognitive impairment of uncertain or unknown etiology 05/21/2023   Multiple lacunar infarcts    bilateral centrum semiovale and right parietal cortex.   Obesity 10/11/2021   Obstructive sleep apnea    no CPAP   Primary open angle glaucoma (POAG) of left eye, moderate stage 10/11/2021   Primary open-angle glaucoma, right eye, mild stage 10/11/2021   Renal insufficiency 09/06/2022   Sinus bradycardia 03/08/2020   Tremor 10/11/2021   Type II diabetes mellitus 10/11/2021   Past Surgical History:  Procedure Laterality Date   CHOLECYSTECTOMY N/A 08/31/2021   Procedure: LAPAROSCOPIC CHOLECYSTECTOMY;  Surgeon: Stechschulte, Hyman Hopes, MD;  Location: [facility] OR;  Service: General;  Laterality: N/A;   EYE SURGERY     Glaucoma surgery   PROSTATE SURGERY     Patient Active  Problem List   Diagnosis Date Noted   Mild cognitive impairment of uncertain or unknown etiology 05/21/2023   Obstructive sleep apnea    Multiple lacunar infarcts    Cerebrovascular disease    Renal insufficiency 09/06/2022   Atrial fibrillation 10/11/2021   Chronic kidney disease, stage 3 unspecified 10/11/2021   Obesity 10/11/2021   Erectile dysfunction 10/11/2021   Primary open angle glaucoma (POAG) of left eye, moderate stage 10/11/2021   Primary open-angle glaucoma, right eye, mild stage 10/11/2021   Tremor 10/11/2021   Type II diabetes mellitus 10/11/2021   Cholecystitis 09/01/2021   Glaucoma 08/31/2021   Arrhythmia    Dizzy spells 03/08/2020   Sinus bradycardia 03/08/2020   Essential hypertension 06/14/2015   Hyperlipidemia 06/14/2015    ONSET DATE: 06/19/2023  REFERRING DIAG: G20.A1 (ICD-10-CM) - Parkinson's disease without dyskinesia or fluctuating manifestations (HCC)   THERAPY DIAG:  Unsteadiness on feet  Other abnormalities of gait and mobility  Rationale for Evaluation and Treatment: Rehabilitation  SUBJECTIVE:  SUBJECTIVE STATEMENT: Patient reports exercises are going well at home. Reports some numbness on L leg and L arm. Denies falls/near falls.   Pt accompanied by:  Spouse, [name]  PERTINENT HISTORY: PMH: PD,  A fb, stage 3 CKD, HLD, HTN, multiple lacunar infarcts, obesity, type II diabetes    Per Dr. Arbutus Leas:  Idiopathic Parkinson's disease, moderate             -Diagnosis was recently made, but likely has had this for a number of years  PAIN:  Are you having pain? No  Vitals:   07/28/23 0942  BP: 117/62  Pulse: (!) 57     PRECAUTIONS: Fall  RED FLAGS: None   WEIGHT BEARING RESTRICTIONS: No  FALLS: Has patient fallen in last 6 months? No  LIVING  ENVIRONMENT: Lives with: lives with their spouse Lives in: House/apartment Stairs: Yes: External: 3 steps; bilateral but cannot reach both Has following equipment at home: None Has trouble getting up from sitting in the bathtub   PLOF: Independent Sometimes needs help with buttoning   PATIENT GOALS: Slow down the progression, maintain as much as possible.   OBJECTIVE:  Note: Objective measures were completed at Evaluation unless otherwise noted.  COGNITION: Overall cognitive status: Within functional limits for tasks assessed and Impaired Neurocognitive testing in February, 2024 with evidence of mild cognitive impairment. This was largely stable compared to prior testing with Dr. Roseanne Reno in 2021.    SENSATION: Light touch: WFL Pt reports sometimes numbness/tingling when getting up in the morning   COORDINATION: Heel to shin: WNL   MUSCLE TONE: Noted more stiffness in LLE    POSTURE: rounded shoulders and forward head  RUE resting tremors   LOWER EXTREMITY ROM:     Noted slightly decr R knee extension AROM   LOWER EXTREMITY MMT:    MMT Right Eval Left Eval  Hip flexion 5 5  Hip extension    Hip abduction    Hip adduction    Hip internal rotation    Hip external rotation    Knee flexion 5 5  Knee extension 5 5  Ankle dorsiflexion 5 5  Ankle plantarflexion    Ankle inversion    Ankle eversion    (Blank rows = not tested)  BED MOBILITY:  Pt reports no difficulty   TRANSFERS: Assistive device utilized: None  Sit to stand: SBA Stand to sit: SBA Performs with no UE support   Pt notes difficulty getting in and out of the car, with bringing the legs in.   Pt reports difficulty getting up off the couch, sometimes will take a couple of tries.    FUNCTIONAL TESTS:  5 times sit to stand: 14 seconds with no UE support  10 meter walk test: 10.91 seconds = 3.01 ft/sec     TODAY'S TREATMENT:  Vitals:   07/28/23 0942  BP: 117/62  Pulse: (!) 57   NMR   Large amplitude sit to stand with reaching for improved posture 10 reps, initial cues for open hands (mirroring therapist to maximize motion) Large amplitude reaching up with weight shift x 10 reps, mirroring therapist to maintain amplitude of movement, cues for open hands Large amplitude side step and twist with horizontal reaching x 10, mirroring therapist to maintain amplitude of movements, cues for large open hands Large amplitude lateral step and with trunk rotation and twist x 10, mirroring therapist, difficulty maintaining UE amplitude of movement RPE: 7/10   Between // bars with CGA for stepping reaction and weight shift: Standing on airex with large step back from airex without UE support 3 x 10 bil (alternating LE) Standing on airex with large step back with alt UE reach up with 4lb red ball working on amplitude of movement 1 x 10 bil, 1 x 10 bil alt Standing on airex lateral step return with same side large reach with 4lb med ball 3 x 10  RPE: 7/10  6 Blaze pods on random setting for improved modified SLS.  Performed on 1 minute intervals with 30 rest periods.  Pt requires CGA guarding. Round 1:  3 blaze pods set on first 2 steps with patient standing on firm, tapping with RLE setup.  36 hits. Round 2:  3 blaze pods set on first 2 steps with patient standing on firm, tapping with LLE setup.  46 hits. Round 3:  3 blaze pods set on first 2 steps with patient standing on firm, tapping with alt LE  setup.  33 hits. Notable errors/deficits:  cues for upright posture between reps, intermittent use of UE on step to steady self  PATIENT EDUCATION: Education details: Continue HEP + discussed home gym equipment, recommend patient bring in picture to discuss use next session Person educated: Patient and Spouse Education method: Explanation, Demonstration, Verbal cues, and  Handouts Education comprehension: verbalized understanding, returned demonstration, and needs further education  HOME EXERCISE PROGRAM: Standing PWR moves   GOALS: Goals reviewed with patient? Yes  SHORT TERM GOALS:  Target date: 08/06/2023  Pt will be independent with initial HEP for PD specific deficits in order to build upon functional gains made in therapy. Baseline: Goal status: INITIAL  2.  Pt will perform posterior stepping strategy in 3 steps or less in order to demo improved balance strategies.  Baseline: at least 5 steps, min A for balance  Goal status: INITIAL  3.  Pt will improve 5x sit<>stand to less than or equal to 12 sec to demonstrate improved functional strength and transfer efficiency.   Baseline: 14 seconds with no UE support Goal status: INITIAL   LONG TERM GOALS: Target date: 09/03/2023   Pt will be independent with final  HEP for PD specific deficits in order to build upon functional gains made in therapy. Baseline:  Goal status: INITIAL  2.  Pt will improve miniBEST to at least a 22/28 in order to demo decr fall risk Baseline: 17/28 Goal status: INITIAL  3.  Pt will verbalize understanding of local Parkinson's disease resources, including options for continue community fitness.  Baseline:  Goal status: INITIAL  4.  Pt will improve TUG and TUG cog score to less than or equal to 10% difference for improved dual task/decreased fall risk.  Baseline:  Goal status: INITIAL  5.  Pt will get up from lower/softer surfaces to mimic a couch/recliner mod I  in order to demo improved transfers  Baseline:  Goal status: INITIAL    ASSESSMENT:  CLINICAL IMPRESSION: Today's skilled session focused on large amplitude movements to maximize mobility, weight shift, and stepping reaction as well as modified SLS tasks. Patient demonstrated no major LOB in today's session even with posterior stepping and was able to maintain amplitude of movement. Greatest  challenge keeping upright posture with modified SLS blaze pod tasks and would benefit from addition reaching component in future sessions. Will continue per POC.    OBJECTIVE IMPAIRMENTS: Abnormal gait, decreased activity tolerance, decreased balance, decreased cognition, decreased coordination, decreased mobility, difficulty walking, decreased strength, impaired flexibility, impaired UE functional use, and postural dysfunction.   ACTIVITY LIMITATIONS: carrying, bending, stairs, transfers, dressing, and locomotion level  PARTICIPATION LIMITATIONS: community activity  PERSONAL FACTORS: Age, Behavior pattern, Past/current experiences, Time since onset of injury/illness/exacerbation, and 3+ comorbidities:  PD,  A fb, stage 3 CKD, HLD, HTN, multiple lacunar infarcts, obesity, type II diabetes   are also affecting patient's functional outcome.   REHAB POTENTIAL: Good  CLINICAL DECISION MAKING: Evolving/moderate complexity  EVALUATION COMPLEXITY: Moderate  PLAN:  PT FREQUENCY: 2x/week  PT DURATION: 8 weeks  PLANNED INTERVENTIONS: Therapeutic exercises, Therapeutic activity, Neuromuscular re-education, Balance training, Gait training, Patient/Family education, Self Care, Joint mobilization, Stair training, DME instructions, and Re-evaluation  PLAN FOR NEXT SESSION: work on SLS, stepping strategies, larger amplitude movement patterns . SciFit for reciprocal movement, endurance. Gait exercises with arm swing, resisted gait with pertubations   Carmelia Bake, PT,DPT 07/28/2023, 12:12 PM

## 2023-07-28 NOTE — Therapy (Unsigned)
OUTPATIENT OCCUPATIONAL THERAPY PARKINSON'S EVALUATION  Patient Name: Joseph Lamb MRN: [id number] DOB:07-02-1951, 72 y.o., male Today's Date: 07/28/2023  PCP: Lupita Raider, MD REFERRING PROVIDER: Vladimir Faster, DO  END OF SESSION:  OT End of Session - 07/28/23 1100     Visit Number 1    Number of Visits 20    Date for OT Re-Evaluation 10/03/23    Authorization Type U[employer] Medicare 2024 10% coinsur NO Ded VL: [place] Auth Not Reqd    Progress Note Due on Visit 10    OT Start Time 1100    OT Stop Time 1155    OT Time Calculation (min) 55 min    Equipment Utilized During Treatment Testing Material    Activity Tolerance Patient tolerated treatment well    Behavior During Therapy [facility] for tasks assessed/performed             Past Medical History:  Diagnosis Date   Adverse effect of antihyperlipidemic and antiarteriosclerotic drugs, initial encounter 10/11/2021   Arrhythmia    Atrial fibrillation 10/11/2021   Cerebrovascular disease    moderate per 2021 MRI   Cholecystitis 09/01/2021   Chronic kidney disease, stage 3 unspecified 10/11/2021   Dizzy spells 03/08/2020   Erectile dysfunction 10/11/2021   Essential hypertension 06/14/2015   Glaucoma 08/31/2021   Hyperlipidemia 06/14/2015   Mild cognitive impairment of uncertain or unknown etiology 05/21/2023   Multiple lacunar infarcts    bilateral centrum semiovale and right parietal cortex.   Obesity 10/11/2021   Obstructive sleep apnea    no CPAP   Primary open angle glaucoma (POAG) of left eye, moderate stage 10/11/2021   Primary open-angle glaucoma, right eye, mild stage 10/11/2021   Renal insufficiency 09/06/2022   Sinus bradycardia 03/08/2020   Tremor 10/11/2021   Type II diabetes mellitus 10/11/2021   Past Surgical History:  Procedure Laterality Date   CHOLECYSTECTOMY N/A 08/31/2021   Procedure: LAPAROSCOPIC CHOLECYSTECTOMY;  Surgeon: Stechschulte, Hyman Hopes, MD;  Location: [facility] OR;  Service: General;   Laterality: N/A;   EYE SURGERY     Glaucoma surgery   PROSTATE SURGERY     Patient Active Problem List   Diagnosis Date Noted   Mild cognitive impairment of uncertain or unknown etiology 05/21/2023   Obstructive sleep apnea    Multiple lacunar infarcts    Cerebrovascular disease    Renal insufficiency 09/06/2022   Atrial fibrillation 10/11/2021   Chronic kidney disease, stage 3 unspecified 10/11/2021   Obesity 10/11/2021   Erectile dysfunction 10/11/2021   Primary open angle glaucoma (POAG) of left eye, moderate stage 10/11/2021   Primary open-angle glaucoma, right eye, mild stage 10/11/2021   Tremor 10/11/2021   Type II diabetes mellitus 10/11/2021   Cholecystitis 09/01/2021   Glaucoma 08/31/2021   Arrhythmia    Dizzy spells 03/08/2020   Sinus bradycardia 03/08/2020   Essential hypertension 06/14/2015   Hyperlipidemia 06/14/2015    ONSET DATE: 06/19/2023  REFERRING DIAG: G20.A1 (ICD-10-CM) - Parkinson's disease without dyskinesia or fluctuating manifestations  THERAPY DIAG:  Other lack of coordination  Muscle weakness (generalized)  Other symptoms and signs involving the musculoskeletal system  Other symptoms and signs involving the nervous system  Tremors of nervous system  Rationale for Evaluation and Treatment: Rehabilitation  SUBJECTIVE:   SUBJECTIVE STATEMENT: Pt reports he's "been dealing with it" when asked how he has been doing.  He reported the following things as difficult ie) hard to get out of bathtub, to put on shirt, button up shirt and  to tie shoelaces.  Pt also reports that his right hand doesn't have the dexterity it used to.   Pt accompanied by: self and significant other - [name]  PERTINENT HISTORY:  PMH: PD, A fb, stage 3 CKD, HLD, HTN, multiple lacunar infarcts, obesity, type II diabetes  PRECAUTIONS: None  WEIGHT BEARING RESTRICTIONS: No  PAIN:  Are you having pain? Yes: NPRS scale: 4-5/10 Pain location: R hand and shoulder &  abdominal Pain description: knawing pain Aggravating factors: When I get upset/tired, eat certain foods - hot/spicy ie) hamburgers, BBQ Relieving factors: Stop the shaking, Tums  FALLS: Has patient fallen in last 6 months? No  LIVING ENVIRONMENT: Lives with: lives with their spouse and lives with their son Lives in: House/apartment Stairs: Yes: External: 3 steps; bilateral but cannot reach both Has following equipment at home: shower chair and Grab bars  PLOF: Independent Sometimes needs help with buttoning & tying laces  PATIENT GOALS: To help with stiffness ie) couldn't reach to back seat).  OBJECTIVE:  Note: Objective measures were completed at Evaluation unless otherwise noted.  HAND DOMINANCE: Right  ADLs: Overall ADLs: *** Transfers/ambulation related to ADLs: Eating: Does it himself. 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 Financial management: Wife is the Management consultant" Handwriting: 50% legible and Mild micrographia  MOBILITY STATUS: Independent  POSTURE COMMENTS:  rounded shoulders and forward head  ACTIVITY TOLERANCE: Activity tolerance: Good  FUNCTIONAL OUTCOME MEASURES: Fastening/unfastening 3 buttons: 42.68 sec Physical performance test: PPT#2 (simulated eating) 15.1 sec & PPT#4 (donning/doffing jacket): 16.09 sec  COORDINATION: 9 Hole Peg test: Right: 36.28   sec; Left: 36.4 sec Box and Blocks:  Right 47 blocks, Left 43 blocks  UE ROM:  WFL  UE MMT:   WFL Right 57.5, 71.4, 75.6 Left 55.9, 70.5, 83.3  SENSATION: WFL  MUSCLE TONE: {UETONE:25567}  COGNITION: Overall cognitive status: Within functional limits for tasks assessed - See ST evaluation  OBSERVATIONS: Bradykinesia, Dystonia, Dyskinesias, Hypokinesia, and Postural tremors   TODAY'S TREATMENT:                                                                                                                              DATE: ***    PATIENT EDUCATION: Education details: *** Person educated: {Person educated:25204} Education method: {Education Method:25205} Education comprehension: {Education Comprehension:25206}  HOME EXERCISE PROGRAM: ***  GOALS: Goals reviewed with patient? {yes/no:20286}  SHORT TERM GOALS: Target date: ***  *** Baseline: Goal status: INITIAL  2.  *** Baseline:  Goal status: INITIAL  3.  *** Baseline:  Goal status: INITIAL  4.  *** Baseline:  Goal status: INITIAL  5.  *** Baseline:  Goal status: INITIAL  6.  *** Baseline:  Goal status: INITIAL  LONG TERM GOALS: Target date: ***  *** Baseline:  Goal status: INITIAL  2.  *** Baseline:  Goal status: INITIAL  3.  *** Baseline:  Goal status: INITIAL  4.  *** Baseline:  Goal status: INITIAL  5.  *** Baseline:  Goal status: INITIAL  6.  *** Baseline:  Goal status: INITIAL ASSESSMENT:  CLINICAL IMPRESSION: Patient is a *** y.o. *** who was seen today for occupational therapy evaluation for ***.   PERFORMANCE DEFICITS: in functional skills including {OT physical skills:25468}, cognitive skills including {OT cognitive skills:25469}, and psychosocial skills including {OT psychosocial skills:25470}.   IMPAIRMENTS: are limiting patient from {OT performance deficits:25471}.   COMORBIDITIES:  {Comorbidities:25485} that affects occupational performance. Patient will  benefit from skilled OT to address above impairments and improve overall function.  MODIFICATION OR ASSISTANCE TO COMPLETE EVALUATION: {OT modification:25474}  OT OCCUPATIONAL PROFILE AND HISTORY: {OT PROFILE AND HISTORY:25484}  CLINICAL DECISION MAKING: {OT CDM:25475}  REHAB POTENTIAL: {rehabpotential:25112}  EVALUATION COMPLEXITY: {Evaluation complexity:25115}    PLAN:  OT FREQUENCY: {rehab frequency:25116}  OT DURATION: {rehab duration:25117}  PLANNED INTERVENTIONS: {OT Interventions:25467}  RECOMMENDED OTHER SERVICES: ***  CONSULTED AND AGREED WITH PLAN OF CARE: {ZOX:09604}  PLAN FOR NEXT SESSION: ***   Victorino Sparrow, OT 07/28/2023, 12:09 PM    .tend

## 2023-07-28 NOTE — Therapy (Signed)
OUTPATIENT SPEECH LANGUAGE PATHOLOGY PARKINSON'S TREATMENT   Patient Name: Joseph Lamb MRN: [id number] DOB:[birth year], 72 y.o., male Today's Date: 07/28/2023  PCP: Lupita Raider MD REFERRING PROVIDER: Vladimir Faster, DO  END OF SESSION:  End of Session - 07/28/23 1016     Visit Number 3    Number of Visits 17    Date for SLP Re-Evaluation 09/03/23    SLP Start Time 1015    SLP Stop Time  1100    SLP Time Calculation (min) 45 min    Activity Tolerance Patient tolerated treatment well              Past Medical History:  Diagnosis Date   Adverse effect of antihyperlipidemic and antiarteriosclerotic drugs, initial encounter 10/11/2021   Arrhythmia    Atrial fibrillation 10/11/2021   Cerebrovascular disease    moderate per 2021 MRI   Cholecystitis 09/01/2021   Chronic kidney disease, stage 3 unspecified 10/11/2021   Dizzy spells 03/08/2020   Erectile dysfunction 10/11/2021   Essential hypertension 06/14/2015   Glaucoma 08/31/2021   Hyperlipidemia 06/14/2015   Mild cognitive impairment of uncertain or unknown etiology 05/21/2023   Multiple lacunar infarcts    bilateral centrum semiovale and right parietal cortex.   Obesity 10/11/2021   Obstructive sleep apnea    no CPAP   Primary open angle glaucoma (POAG) of left eye, moderate stage 10/11/2021   Primary open-angle glaucoma, right eye, mild stage 10/11/2021   Renal insufficiency 09/06/2022   Sinus bradycardia 03/08/2020   Tremor 10/11/2021   Type II diabetes mellitus 10/11/2021   Past Surgical History:  Procedure Laterality Date   CHOLECYSTECTOMY N/A 08/31/2021   Procedure: LAPAROSCOPIC CHOLECYSTECTOMY;  Surgeon: Stechschulte, Hyman Hopes, MD;  Location: [facility] OR;  Service: General;  Laterality: N/A;   EYE SURGERY     Glaucoma surgery   PROSTATE SURGERY     Patient Active Problem List   Diagnosis Date Noted   Mild cognitive impairment of uncertain or unknown etiology 05/21/2023   Obstructive sleep apnea     Multiple lacunar infarcts    Cerebrovascular disease    Renal insufficiency 09/06/2022   Atrial fibrillation 10/11/2021   Chronic kidney disease, stage 3 unspecified 10/11/2021   Obesity 10/11/2021   Erectile dysfunction 10/11/2021   Primary open angle glaucoma (POAG) of left eye, moderate stage 10/11/2021   Primary open-angle glaucoma, right eye, mild stage 10/11/2021   Tremor 10/11/2021   Type II diabetes mellitus 10/11/2021   Cholecystitis 09/01/2021   Glaucoma 08/31/2021   Arrhythmia    Dizzy spells 03/08/2020   Sinus bradycardia 03/08/2020   Essential hypertension 06/14/2015   Hyperlipidemia 06/14/2015    ONSET DATE: 06/19/2023(referral date)  REFERRING DIAG:  Diagnosis  G20.A1 (ICD-10-CM) - Parkinson's disease 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 Don Perking consult: [name] was seen in the movement disorders clinic for neurologic consultation at the request of Rosann Auerbach, PhD.  The consultation is for the evaluation of tremor and Parkinson's.  Pt with wife who supplements hx.  Patient has been seen in our clinic for a long time.  He was first seen by Dr. Karel Jarvis in 2016 and Dr. Marjory Lies prior to that.  Initial evaluation for his further evaluation of memory change, but at that point in time his MoCA  was 29/30.  He came back for further evaluation in 2021.  In the June, 2021 evaluation, it is noted that patient had begun to have hand tremors for 5 months prior.  At that point in time, Dr. Karel Jarvis did not see much on the examination, however.  By August, 2022, Dr. Karel Jarvis did note occasional right thumb resting tremor and mild cogwheeling in the office.  The following visits were with Marlowe Kays.  With time, she began to note  right greater than left tremors at rest, mouth tremor, sialorrhea and increased tone.  He was started on levodopa by her in February, 2024.  He apparently discontinued it due to the fact that he felt his heart was fluttering and he d/c the medication.  .he thinks that he took it for a months.  He states that mostly he noted no difference in tremor so he d/c it.  he saw Dr. Milbert Coulter for memory testing in August, 2024, demonstrating mild cognitive impairment.  PAIN:  Are you having pain? No  FALLS: Has patient fallen in last 6 months?  See PT evaluation for details  LIVING ENVIRONMENT: Lives with: lives with their spouse and lives with their son Lives in: House/apartment  PLOF:  Level of assistance: Independent with ADLs, Independent with IADLs Employment: Retired  PATIENT GOALS: "To talk better"  OBJECTIVE:  Note: Objective measures were completed at Evaluation unless otherwise noted.  COGNITION: Overall cognitive status: Impaired Areas of impairment: Memory Comments: MCI per neuropsych, slow processing  MOTOR SPEECH: Overall motor speech: impaired Level of impairment: Sentence Respiration: thoracic breathing Phonation: low vocal intensity Resonance: WFL Articulation: Appears intact Intelligibility: Intelligibility reduced Motor planning: Appears intact Motor speech errors: unaware Interfering components:  n/a Effective technique: increased vocal intensity  ORAL MOTOR EXAMINATION: Overall status: WFL Comments: n/a   OBJECTIVE VOICE ASSESSMENT: Sustained "ah" maximum phonation time: 16.71 seconds Sustained "ah" loudness average: 72 dB Oral reading (passage) loudness average: 70 dB Oral reading loudness range: 69-74 dB Conversational loudness average: 66 dB Conversational loudness range: 61-67 dB Voice quality: breathy and low vocal intensity Stimulability trials: Given SLP modeling and usual min cues, loudness average increased to 72dB (range of 70 to 74) at  sentence  level and loud Ah increased to 85dB Comments: Tripper carried over speaking with intent into conversation by the end of the sessions  Completed audio recording of patients baseline voice without cueing from SLP: No  Pt does not report difficulty with swallowing which does not warrant further evaluation.  PATIENT REPORTED OUTCOME MEASURES (PROM): Communication Effectiveness Survey: 19/32 - Rated by spouse. [name] rated a 2, or less effective communication on conversing with strangers, conversing over the phone, conversing in noisy environment, traveling in a car, and conversing at a distance  TODAY'S TREATMENT:  DATE:   07/28/23: [name] reports completing Speak Out! Through lesson 5, however he did not complete each lesson twice. I reviewed requirement to complete each lesson 2x, twice a day. We completed Lesson 5 today.  Targeted volume and intelligibility using Speak Out! Lesson 5. Pt required occasional min  verbal cues, modeling, for volume and breath support.  Pt averages the following volume levels:  Sustained AH: 90dB  Counting:84dB  Reading (phrases): 80dB  Cognitive Exercise: 78dB Required usual min to mod verbal cues, modeling for carryover of intent /volume on divergent naming task. Usual min semantic cues to generate 5-8 items in simple categories. In simple description re: holiday traditions, Xyon averaged 72dB with usual mod modeling, verbal and gesture cues.    07/23/23: Arval brings Speak Out! Workbook to the session. He and [name] have watched the Learn About Parkinson's webinar on [employer]. Instructed them to have their son watch it as well. Tyheim endorses "I have been speaking low for a long time, I didn't realize it"  Previewed the workbook with them, introduced requirement of twice daily practice, advancing lessons  after 2 sessions. Demonstrated online Speak Out! Sessions. Targeted volume and intelligibility using Speak Out! Lesson 1. Pt required usual to  occasional min verbal cues and modeling, for volume and breath support.  Pt averages the following volume levels  Sustained AH: 85dB  Counting:85dB  Reading (phrases): 75dB  Cognitive Exercise: 72dB Required usual mod verbal cues, modeling for carryover of intent /volume generating 3 sentence descriptions of basic objects.  following structured practice. In simple conversation over 5 minutes, Janthony averaged 70dB with usual mod visual and verbal cues.    07/09/23 (eval day): Introduced [employer]! Workbook to [employer]. Ordered work book - Introduced them to [employer], demonstrated location of Learn about [employer] and on Physiological scientist. Instructed them to watch webinar and an online practice session and have their 2 sons watch the webinar as well. Educated re: Automatic vs Intentional motor systems. [name] Id'd when he was speaking or reading with intent vs not using intent. By the end of the session, he was conversing with intent to average 70dB with occasional min a  PATIENT EDUCATION: Education details: See Today's Treatment; See patient instructions; HEP for dysarthria; PD education Person educated: Patient and Spouse Education method: Explanation, Demonstration, Verbal cues, and Handouts Education comprehension: verbalized understanding, returned demonstration, verbal cues required, and needs further education  HOME EXERCISE PROGRAM: Speak Out! Workbook ordered - they are to bring it to ST when it arrives   GOALS: Goals reviewed with patient? Yes  SHORT TERM GOALS: Target date: 08/06/23  Pt will complete HEP hitting target dB's with occasional min A over 1 week Baseline: Goal status: ONGOING  2.  Pt will complete HEP twice a day, completing at least 1 on line session with [facility] [employer] [facility]  website Baseline:  Goal status: ONGOING  3.  Pt will averge 72dB 18/20 sentences with rare min A Baseline:  Goal status: ONGOING  4.  Pt will average 72dB over 12 minute converation with rare min A Baseline:  Goal status: ONGOING  5.  Pt and his family will watch Learn about Parkinson's webinar on [facility] [employer] website Baseline:  Goal status: ONGING              LONG TERM GOALS: Target date: 09/03/23  Pt will complete HEP for dysarthria twice daily accurately over 1 week with mod I Baseline:  Goal status: ONGOING  2.  Pt will complete 2 on line practice sessions from [employer] online Baseline:  Goal status: ONGOING  3.  Pt will maintain 70dB over 15 minute conversation with mod I Baseline:  Goal status: ONGOING  4.  Pt will improve score on Communicative Effectiveness Survey by 3 points Baseline: 19 - spouse rated due to reduced pt awareness Goal status: ONGOING  5.  Pt/spouse will verbalize requirement to continue Speak Out! Daily practice with workbook and online up d/c of ST Baseline:  Goal status: ONGOING   ASSESSMENT:  CLINICAL IMPRESSION: Patient is a 72 y.o. 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 He is accompanied by his spouse, Teryl Lucy. Matin verbalizes reduced awareness of low volume, stating his spouse needs a hearing aid. He does endorse he is "a little low" at times. [name] reports frequent requests for repetition and difficulty hearing Mikio. They deny difficulty swallowing, coughing or getting choked or strangled. Meds are swallowed without difficulty. They report some memory issues, such as walking into a room and forgetting why, however they both feel that Oakley' current cognitive function is acceptable for day to day activities. Demarri' volume improved significantly after initial training for speak with intent and he demonstrated carry over during simple short conversation at the end of the evaluation. I  recommend skilled ST to maximize intelligibility for independence, QOL, reduce caregiver burden.   OBJECTIVE IMPAIRMENTS: Objective impairments include memory and dysarthria. These impairments are limiting patient from effectively communicating at home and in community.Factors affecting potential to achieve goals and functional outcome are medical prognosis.. Patient will benefit from skilled SLP services to address above impairments and improve overall function.  REHAB POTENTIAL: Good  PLAN:  SLP FREQUENCY: 2x/week  SLP DURATION: 8 weeks  PLANNED INTERVENTIONS: Aspiration precaution training, Diet toleration management , Environmental controls, Cueing hierachy, Cognitive reorganization, Internal/external aids, Functional tasks, Multimodal communication approach, and SLP instruction and feedback MBSS    Julieana Eshleman, Radene Journey, CCC-SLP 07/28/2023, 11:09 AM

## 2023-07-30 ENCOUNTER — Ambulatory Visit: Payer: Medicare Other | Admitting: Speech Pathology

## 2023-07-30 ENCOUNTER — Ambulatory Visit: Payer: Medicare Other | Admitting: Occupational Therapy

## 2023-07-30 ENCOUNTER — Encounter: Payer: Self-pay | Admitting: Speech Pathology

## 2023-07-30 ENCOUNTER — Encounter: Payer: Self-pay | Admitting: Physical Therapy

## 2023-07-30 ENCOUNTER — Ambulatory Visit: Payer: Medicare Other | Admitting: Physical Therapy

## 2023-07-30 DIAGNOSIS — M6281 Muscle weakness (generalized): Secondary | ICD-10-CM

## 2023-07-30 DIAGNOSIS — R251 Tremor, unspecified: Secondary | ICD-10-CM

## 2023-07-30 DIAGNOSIS — R278 Other lack of coordination: Secondary | ICD-10-CM

## 2023-07-30 DIAGNOSIS — R29818 Other symptoms and signs involving the nervous system: Secondary | ICD-10-CM

## 2023-07-30 DIAGNOSIS — R2689 Other abnormalities of gait and mobility: Secondary | ICD-10-CM

## 2023-07-30 DIAGNOSIS — R471 Dysarthria and anarthria: Secondary | ICD-10-CM

## 2023-07-30 DIAGNOSIS — R2681 Unsteadiness on feet: Secondary | ICD-10-CM | POA: Diagnosis not present

## 2023-07-30 DIAGNOSIS — R29898 Other symptoms and signs involving the musculoskeletal system: Secondary | ICD-10-CM

## 2023-07-30 NOTE — Patient Instructions (Addendum)
PWR! Hand Exercises Perform each exercise at least 10 repetitions 1x/day, and PWR! PUSH throughout the day when you are having trouble using your hands (picking up small objects, writing, eating, typing, buttoning, etc.). ** Make each movement big and deliberate; feel the movement.  PWR! UP: Fists to open fingers BIG  PWR! Rock:  Move wrists up and down [employer]! Twist: Twist palms up and down BIG  PWR! Step: Step your thumb to index finger while keeping other fingers straight. Flick fingers out BIG (thumb out/straighten fingers). Repeat with other fingers.  PWR! PUSH: Push hands out BIG. Elbows straight, wrists up, fingers open and spread apart BIG.      Performing Daily Activities with Big Movements  Pick at least 2 activities a day and perform with BIG, DELIBERATE movements/effort.  Try different activities each day. This can make the activity easier and turn daily activities into exercise to prevent problems in the future!  If you are standing during the activity, make sure to keep feet apart and stand with good/big/posture.  Examples: Dressing  Pull-over shirt:  good posture, bring shirt to head (don't bring head down), deliberately push arms into sleeves Jacket:  stand with feet apart, twist when putting on jacket, deliberate push arms into sleeves Underwear/Pants:  Sit, lean forward, and push foot into pants deliberately Open hands to pull down shirt/put on socks/pull up pants--get more material in your hand  Buttoning - Open hands big before fastening each button, deliberate movement (angry buttons--push button through hole), unfasten by using pull-push method   Bathing - Wash/dry with long strokes  Brushing your teeth - Big, slow movements  Cutting food - Long deliberate cuts, put tip of knife down in front of food  Eating - Hold utensil in the middle (not the end), hold fork straight up and down to stab food  Picking up a cup/bottle - Open hand up big and get object all  the way in palm  Opening jar/bottle - Move as much as you can with each turn, twist wrist  Putting on seatbelt - Feet apart, twist when reaching, look at where you are reaching  Hanging up clothes/getting clothes down from closet - Reach with big effort, open hand, straighten elbow  Putting away groceries/dishes - Reach with big effort  Wiping counter/table - Move in big, long strokes, open hand  Stirring while cooking - Exaggerate movement  Cleaning windows - Feet apart, move in big, long strokes  Sweeping/Raking- Feet apart and one foot in front of the other, move arms in big, long strokes  Vacuuming - Feet apart and one in front of the other, push with big movement  Folding clothes - Exaggerate arm movements  Washing car - Move in big, long strokes, feet apart  Changing light bulb or Using a screwdriver - Move as much as you can with each turn, twist wrist  Walking into a store/restaurant - Walk with big steps, good posture, swing arms if able  Standing up from a chair/recliner/sofa - Scoot forward, lean forward, and stand with big effort  Picking up something from floor/reaching in low cabinet - Get close to object, Position feet apart and one in front of the other.

## 2023-07-30 NOTE — Therapy (Signed)
OUTPATIENT PHYSICAL THERAPY NEURO TREATMENT   Patient Name: Joseph Lamb MRN: [id number] DOB:1951-06-07, 72 y.o., male Today's Date: 07/30/2023   PCP: Lupita Raider, MD  REFERRING PROVIDER:  Vladimir Faster, DO  END OF SESSION:  PT End of Session - 07/30/23 0849     Visit Number 4    Number of Visits 13    Date for PT Re-Evaluation 09/07/23    Authorization Type [employer]    PT Start Time 0847    PT Stop Time 0930    PT Time Calculation (min) 43 min    Equipment Utilized During Treatment Gait belt    Activity Tolerance Patient tolerated treatment well    Behavior During Therapy [facility] for tasks assessed/performed             Past Medical History:  Diagnosis Date   Adverse effect of antihyperlipidemic and antiarteriosclerotic drugs, initial encounter 10/11/2021   Arrhythmia    Atrial fibrillation 10/11/2021   Cerebrovascular disease    moderate per 2021 MRI   Cholecystitis 09/01/2021   Chronic kidney disease, stage 3 unspecified 10/11/2021   Dizzy spells 03/08/2020   Erectile dysfunction 10/11/2021   Essential hypertension 06/14/2015   Glaucoma 08/31/2021   Hyperlipidemia 06/14/2015   Mild cognitive impairment of uncertain or unknown etiology 05/21/2023   Multiple lacunar infarcts    bilateral centrum semiovale and right parietal cortex.   Obesity 10/11/2021   Obstructive sleep apnea    no CPAP   Primary open angle glaucoma (POAG) of left eye, moderate stage 10/11/2021   Primary open-angle glaucoma, right eye, mild stage 10/11/2021   Renal insufficiency 09/06/2022   Sinus bradycardia 03/08/2020   Tremor 10/11/2021   Type II diabetes mellitus 10/11/2021   Past Surgical History:  Procedure Laterality Date   CHOLECYSTECTOMY N/A 08/31/2021   Procedure: LAPAROSCOPIC CHOLECYSTECTOMY;  Surgeon: Stechschulte, Hyman Hopes, MD;  Location: [facility] OR;  Service: General;  Laterality: N/A;   EYE SURGERY     Glaucoma surgery   PROSTATE SURGERY     Patient Active  Problem List   Diagnosis Date Noted   Mild cognitive impairment of uncertain or unknown etiology 05/21/2023   Obstructive sleep apnea    Multiple lacunar infarcts    Cerebrovascular disease    Renal insufficiency 09/06/2022   Atrial fibrillation 10/11/2021   Chronic kidney disease, stage 3 unspecified 10/11/2021   Obesity 10/11/2021   Erectile dysfunction 10/11/2021   Primary open angle glaucoma (POAG) of left eye, moderate stage 10/11/2021   Primary open-angle glaucoma, right eye, mild stage 10/11/2021   Tremor 10/11/2021   Type II diabetes mellitus 10/11/2021   Cholecystitis 09/01/2021   Glaucoma 08/31/2021   Arrhythmia    Dizzy spells 03/08/2020   Sinus bradycardia 03/08/2020   Essential hypertension 06/14/2015   Hyperlipidemia 06/14/2015    ONSET DATE: 06/19/2023  REFERRING DIAG: G20.A1 (ICD-10-CM) - Parkinson's disease without dyskinesia or fluctuating manifestations (HCC)   THERAPY DIAG:  Unsteadiness on feet  Other abnormalities of gait and mobility  Muscle weakness (generalized)  Other lack of coordination  Other symptoms and signs involving the nervous system  Rationale for Evaluation and Treatment: Rehabilitation  SUBJECTIVE:  SUBJECTIVE STATEMENT: Reports things are going well at home. Notices improvements with his arm/shoulder mobility.   Pt accompanied by:  Spouse, [name]  PERTINENT HISTORY: PMH: PD,  A fb, stage 3 CKD, HLD, HTN, multiple lacunar infarcts, obesity, type II diabetes    Per Dr. Arbutus Leas:  Idiopathic Parkinson's disease, moderate             -Diagnosis was recently made, but likely has had this for a number of years  PAIN:  Are you having pain? No  There were no vitals filed for this visit.   PRECAUTIONS: Fall  RED FLAGS: None   WEIGHT BEARING  RESTRICTIONS: No  FALLS: Has patient fallen in last 6 months? No  LIVING ENVIRONMENT: Lives with: lives with their spouse Lives in: House/apartment Stairs: Yes: External: 3 steps; bilateral but cannot reach both Has following equipment at home: None Has trouble getting up from sitting in the bathtub   PLOF: Independent Sometimes needs help with buttoning   PATIENT GOALS: Slow down the progression, maintain as much as possible.   OBJECTIVE:  Note: Objective measures were completed at Evaluation unless otherwise noted.  COGNITION: Overall cognitive status: Within functional limits for tasks assessed and Impaired Neurocognitive testing in February, 2024 with evidence of mild cognitive impairment. This was largely stable compared to prior testing with Dr. Roseanne Reno in 2021.    SENSATION: Light touch: WFL Pt reports sometimes numbness/tingling when getting up in the morning   COORDINATION: Heel to shin: WNL   MUSCLE TONE: Noted more stiffness in LLE    POSTURE: rounded shoulders and forward head  RUE resting tremors   LOWER EXTREMITY ROM:     Noted slightly decr R knee extension AROM   LOWER EXTREMITY MMT:    MMT Right Eval Left Eval  Hip flexion 5 5  Hip extension    Hip abduction    Hip adduction    Hip internal rotation    Hip external rotation    Knee flexion 5 5  Knee extension 5 5  Ankle dorsiflexion 5 5  Ankle plantarflexion    Ankle inversion    Ankle eversion    (Blank rows = not tested)  BED MOBILITY:  Pt reports no difficulty   TRANSFERS: Assistive device utilized: None  Sit to stand: SBA Stand to sit: SBA Performs with no UE support   Pt notes difficulty getting in and out of the car, with bringing the legs in.   Pt reports difficulty getting up off the couch, sometimes will take a couple of tries.    FUNCTIONAL TESTS:  5 times sit to stand: 14 seconds with no UE support  10 meter walk test: 10.91 seconds = 3.01 ft/sec     TODAY'S  TREATMENT:                                                                                                                               There were  no vitals filed for this visit.  Therapeutic Exercise:  SciFit with BUE/BLE at [facility] Gear 3.0 for 8 minutes for neural priming, reciprocal movement patterns, incr amplitude of stepping, activity tolerance. Pt keep spm >90  NMR  On blue mat: Staggered stance weight shifting with legs only with cues for incr weight shift posteriorly, then adding in arm swing x15 reps each side, focus on big movements with opening up hands and also extending arm more posteriorly with elbow extension. Pt really enjoying this exercise and feeling good afterwards and requesting it to be added to HEP.  With 5 blaze pods on mirror in superior and lateral directions for weight shifting, visual scanning, and reaction times, pt performing sit > stand and having to tap blaze pod and them perform stand > sit, repeated this pattern throughout,trying to perform sit <> stands without UE support  Performed 2 sets of 1 minute: 10 hits, 11 hits (alternating between hitting with R and L arm), pt performing 2nd set with more wide BOS due to more fatigue Pt reporting 7-8/10 RPE  With 5 blaze pods on the mirror - in lateral direction and superior/lateral direction for weight shifting, reaching across body for trunk rotation, visual scanning, performed standing on blue foam beam:  Performed 3 sets of 1 minute: 27 hits, 8 hits and 5 hits (with added cognitive challenge with naming food from [employer])    During session, intermittent cues for RUE>LUE arm swing during gait.   PATIENT EDUCATION: Education details: Continue HEP, staggered stance weight shifting with arm swing added to HEP  Person educated: Patient and Spouse Education method: Programmer, multimedia, Facilities manager, Verbal cues, and Handouts Education comprehension: verbalized understanding, returned demonstration, and needs further  education  HOME EXERCISE PROGRAM: Standing PWR moves   Access Code: [email or web address] URL: [email or web address] Date: 07/30/2023 Prepared by: Sherlie Ban  Exercises - Staggered Stance Weight Shift with Arms Reaching  - 1-2 x daily - 5 x weekly - 2 sets - 10 reps  GOALS: Goals reviewed with patient? Yes  SHORT TERM GOALS:  Target date: 08/06/2023  Pt will be independent with initial HEP for PD specific deficits in order to build upon functional gains made in therapy. Baseline: Goal status: INITIAL  2.  Pt will perform posterior stepping strategy in 3 steps or less in order to demo improved balance strategies.  Baseline: at least 5 steps, min A for balance  Goal status: INITIAL  3.  Pt will improve 5x sit<>stand to less than or equal to 12 sec to demonstrate improved functional strength and transfer efficiency.   Baseline: 14 seconds with no UE support Goal status: INITIAL   LONG TERM GOALS: Target date: 09/03/2023   Pt will be independent with final  HEP for PD specific deficits in order to build upon functional gains made in therapy. Baseline:  Goal status: INITIAL  2.  Pt will improve miniBEST to at least a 22/28 in order to demo decr fall risk Baseline: 17/28 Goal status: INITIAL  3.  Pt will verbalize understanding of local Parkinson's disease resources, including options for continue community fitness.  Baseline:  Goal status: INITIAL  4.  Pt will improve TUG and TUG cog score to less than or equal to 10% difference for improved dual task/decreased fall risk.  Baseline:  Goal status: INITIAL  5.  Pt will get up from lower/softer surfaces to mimic a couch/recliner mod I in order to demo improved transfers  Baseline:  Goal status: INITIAL    ASSESSMENT:  CLINICAL IMPRESSION:  Today's skilled session focused on SciFit for neural priming/larger amplitude and balance strategies with focus on weight shifting, trunk rotation. Pt reporting RPE as  7-8/10, when performing sit <> stand task with reaching with blaze pod tap on an unlevel surface. Pt challenged with adding in cognitive dual tasking, with pt with slower motor movement when having to think of the name of a food from A-Z. Will continue per POC.     OBJECTIVE IMPAIRMENTS: Abnormal gait, decreased activity tolerance, decreased balance, decreased cognition, decreased coordination, decreased mobility, difficulty walking, decreased strength, impaired flexibility, impaired UE functional use, and postural dysfunction.   ACTIVITY LIMITATIONS: carrying, bending, stairs, transfers, dressing, and locomotion level  PARTICIPATION LIMITATIONS: community activity  PERSONAL FACTORS: Age, Behavior pattern, Past/current experiences, Time since onset of injury/illness/exacerbation, and 3+ comorbidities:  PD,  A fb, stage 3 CKD, HLD, HTN, multiple lacunar infarcts, obesity, type II diabetes   are also affecting patient's functional outcome.   REHAB POTENTIAL: Good  CLINICAL DECISION MAKING: Evolving/moderate complexity  EVALUATION COMPLEXITY: Moderate  PLAN:  PT FREQUENCY: 2x/week  PT DURATION: 8 weeks  PLANNED INTERVENTIONS: Therapeutic exercises, Therapeutic activity, Neuromuscular re-education, Balance training, Gait training, Patient/Family education, Self Care, Joint mobilization, Stair training, DME instructions, and Re-evaluation  PLAN FOR NEXT SESSION: work on SLS, stepping strategies, larger amplitude movement patterns . SciFit for reciprocal movement, endurance. Gait exercises with arm swing, resisted gait with pertubations   Toshua Honsinger N Shareeka Yim, PT,DPT 07/30/2023, 11:42 AM

## 2023-07-30 NOTE — Therapy (Signed)
OUTPATIENT SPEECH LANGUAGE PATHOLOGY PARKINSON'S TREATMENT   Patient Name: Joseph Lamb MRN: [id number] DOB:1951/06/09, 72 y.o., male Today's Date: 07/30/2023  PCP: Lupita Raider MD REFERRING PROVIDER: Vladimir Faster, DO  END OF SESSION:  End of Session - 07/30/23 0947     Visit Number 4    Number of Visits 17    Date for SLP Re-Evaluation 09/03/23    SLP Start Time 0935    SLP Stop Time  1015    SLP Time Calculation (min) 40 min    Activity Tolerance Patient tolerated treatment well              Past Medical History:  Diagnosis Date   Adverse effect of antihyperlipidemic and antiarteriosclerotic drugs, initial encounter 10/11/2021   Arrhythmia    Atrial fibrillation 10/11/2021   Cerebrovascular disease    moderate per 2021 MRI   Cholecystitis 09/01/2021   Chronic kidney disease, stage 3 unspecified 10/11/2021   Dizzy spells 03/08/2020   Erectile dysfunction 10/11/2021   Essential hypertension 06/14/2015   Glaucoma 08/31/2021   Hyperlipidemia 06/14/2015   Mild cognitive impairment of uncertain or unknown etiology 05/21/2023   Multiple lacunar infarcts    bilateral centrum semiovale and right parietal cortex.   Obesity 10/11/2021   Obstructive sleep apnea    no CPAP   Primary open angle glaucoma (POAG) of left eye, moderate stage 10/11/2021   Primary open-angle glaucoma, right eye, mild stage 10/11/2021   Renal insufficiency 09/06/2022   Sinus bradycardia 03/08/2020   Tremor 10/11/2021   Type II diabetes mellitus 10/11/2021   Past Surgical History:  Procedure Laterality Date   CHOLECYSTECTOMY N/A 08/31/2021   Procedure: LAPAROSCOPIC CHOLECYSTECTOMY;  Surgeon: Stechschulte, Hyman Hopes, MD;  Location: [facility] OR;  Service: General;  Laterality: N/A;   EYE SURGERY     Glaucoma surgery   PROSTATE SURGERY     Patient Active Problem List   Diagnosis Date Noted   Mild cognitive impairment of uncertain or unknown etiology 05/21/2023   Obstructive sleep apnea     Multiple lacunar infarcts    Cerebrovascular disease    Renal insufficiency 09/06/2022   Atrial fibrillation 10/11/2021   Chronic kidney disease, stage 3 unspecified 10/11/2021   Obesity 10/11/2021   Erectile dysfunction 10/11/2021   Primary open angle glaucoma (POAG) of left eye, moderate stage 10/11/2021   Primary open-angle glaucoma, right eye, mild stage 10/11/2021   Tremor 10/11/2021   Type II diabetes mellitus 10/11/2021   Cholecystitis 09/01/2021   Glaucoma 08/31/2021   Arrhythmia    Dizzy spells 03/08/2020   Sinus bradycardia 03/08/2020   Essential hypertension 06/14/2015   Hyperlipidemia 06/14/2015    ONSET DATE: 06/19/2023(referral date)  REFERRING DIAG:  Diagnosis  G20.A1 (ICD-10-CM) - Parkinson's disease without dyskinesia or fluctuating manifestations (HCC)    THERAPY DIAG:  Dysarthria and anarthria  Rationale for Evaluation and Treatment: Rehabilitation  SUBJECTIVE:   SUBJECTIVE STATEMENT: 'I think this is helpful" Pt accompanied by: significant other  PERTINENT HISTORY: From Dr. Don Perking consult: [name] was seen in the movement disorders clinic for neurologic consultation at the request of Rosann Auerbach, PhD.  The consultation is for the evaluation of tremor and Parkinson's.  Pt with wife who supplements hx.  Patient has been seen in our clinic for a long time.  He was first seen by Dr. Karel Jarvis in 2016 and Dr. Marjory Lies prior to that.  Initial evaluation for his further evaluation of memory change, but at that point in time his  MoCA was 29/30.  He came back for further evaluation in 2021.  In the June, 2021 evaluation, it is noted that patient had begun to have hand tremors for 5 months prior.  At that point in time, Dr. Karel Jarvis did not see much on the examination, however.  By August, 2022, Dr. Karel Jarvis did note occasional right thumb resting tremor and mild cogwheeling in the office.  The following visits were with Marlowe Kays.  With time, she began to  note right greater than left tremors at rest, mouth tremor, sialorrhea and increased tone.  He was started on levodopa by her in February, 2024.  He apparently discontinued it due to the fact that he felt his heart was fluttering and he d/c the medication.  .he thinks that he took it for a months.  He states that mostly he noted no difference in tremor so he d/c it.  he saw Dr. Milbert Coulter for memory testing in August, 2024, demonstrating mild cognitive impairment.  PAIN:  Are you having pain? No  FALLS: Has patient fallen in last 6 months?  See PT evaluation for details  LIVING ENVIRONMENT: Lives with: lives with their spouse and lives with their son Lives in: House/apartment  PLOF:  Level of assistance: Independent with ADLs, Independent with IADLs Employment: Retired  PATIENT GOALS: "To talk better"  OBJECTIVE:   TODAY'S TREATMENT:                                                                                                                                         DATE:   07/30/23: Joseph Lamb reports completing Speak Out! 1x - continue to require him to complete this twice a day. He self correct intent voice 3x with supervision cues in initial conversation.  Targeted volume and intelligibility using Speak Out! Lesson 6. Pt required rare min  verbal cues, modeling, for volume and breath support. Pt averages the following volume levels:  Sustained AH: 93 dB  Counting:87  Reading (phrases): 87dB  Cognitive Exercise: 75dB Required usual mod verbal cues, modeling for carryover of intent /volume completing and explaining idioms   07/28/23: Joseph Lamb reports completing Speak Out! Through lesson 5, however he did not complete each lesson twice. I reviewed requirement to complete each lesson 2x, twice a day. We completed Lesson 5 today.  Targeted volume and intelligibility using Speak Out! Lesson 5. Pt required occasional min  verbal cues, modeling, for volume and breath support.  Pt averages the  following volume levels:  Sustained AH: 90dB  Counting:84dB  Reading (phrases): 80dB  Cognitive Exercise: 78dB Required usual min to mod verbal cues, modeling for carryover of intent /volume on divergent naming task. Usual min semantic cues to generate 5-8 items in simple categories. In simple description re: holiday traditions, Joseph Lamb averaged 72dB with usual mod modeling, verbal and gesture cues.    07/23/23: Joseph Lamb brings Speak Out! Workbook to the session.  He and Joseph Lamb have watched the Learn About Parkinson's webinar on [employer]. Instructed them to have their son watch it as well. [place]ford endorses "I have been speaking low for a long time, I didn't realize it"  Previewed the workbook with them, introduced requirement of twice daily practice, advancing lessons after 2 sessions. Demonstrated online Speak Out! Sessions. Targeted volume and intelligibility using Speak Out! Lesson 1. Pt required usual to  occasional min verbal cues and modeling, for volume and breath support.  Pt averages the following volume levels  Sustained AH: 85dB  Counting:85dB  Reading (phrases): 75dB  Cognitive Exercise: 72dB Required usual mod verbal cues, modeling for carryover of intent /volume generating 3 sentence descriptions of basic objects.  following structured practice. In simple conversation over 5 minutes, Joseph Lamb averaged 70dB with usual mod visual and verbal cues.    07/09/23 (eval day): Introduced [employer]! Workbook to [employer]. Ordered work book - Introduced them to [employer], demonstrated location of Learn about [employer] and on Physiological scientist. Instructed them to watch webinar and an online practice session and have their 2 sons watch the webinar as well. Educated re: Automatic vs Intentional motor systems. Joseph Lamb Id'd when he was speaking or reading with intent vs not using intent. By the end of the session, he was conversing with intent to average  70dB with occasional min a  PATIENT EDUCATION: Education details: See Today's Treatment; See patient instructions; HEP for dysarthria; PD education Person educated: Patient and Spouse Education method: Explanation, Demonstration, Verbal cues, and Handouts Education comprehension: verbalized understanding, returned demonstration, verbal cues required, and needs further education  HOME EXERCISE PROGRAM: Speak Out! Workbook ordered - they are to bring it to ST when it arrives   GOALS: Goals reviewed with patient? Yes  SHORT TERM GOALS: Target date: 08/06/23  Pt will complete HEP hitting target dB's with occasional min A over 1 week Baseline: Goal status: ONGOING  2.  Pt will complete HEP twice a day, completing at least 1 on line session with Parkinson [employer] website Baseline:  Goal status: ONGOING  3.  Pt will averge 72dB 18/20 sentences with rare min A Baseline:  Goal status: ONGOING  4.  Pt will average 72dB over 12 minute converation with rare min A Baseline:  Goal status: ONGOING  5.  Pt and his family will watch Learn about Parkinson's webinar on [employer] website Baseline:  Goal status: ONGING              LONG TERM GOALS: Target date: 09/03/23  Pt will complete HEP for dysarthria twice daily accurately over 1 week with mod I Baseline:  Goal status: ONGOING  2.  Pt will complete 2 on line practice sessions from [employer] online Baseline:  Goal status: ONGOING  3.  Pt will maintain 70dB over 15 minute conversation with mod I Baseline:  Goal status: ONGOING  4.  Pt will improve score on Communicative Effectiveness Survey by 3 points Baseline: 19 - spouse rated due to reduced pt awareness Goal status: ONGOING  5.  Pt/spouse will verbalize requirement to continue Speak Out! Daily practice with workbook and online up d/c of ST Baseline:  Goal status: ONGOING   ASSESSMENT:  CLINICAL IMPRESSION: Patient is a 72 y.o. 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 He is accompanied by his spouse, Teryl Lucy. Joseph Lamb verbalizes reduced awareness of low volume, stating his spouse needs  a hearing aid. He does endorse he is "a little low" at times. Joseph Lamb reports frequent requests for repetition and difficulty hearing Joseph Lamb. They deny difficulty swallowing, coughing or getting choked or strangled. Meds are swallowed without difficulty. They report some memory issues, such as walking into a room and forgetting why, however they both feel that Javien' current cognitive function is acceptable for day to day activities. Joseph Lamb' volume improved significantly after initial training for speak with intent and he demonstrated carry over during simple short conversation at the end of the evaluation. I recommend skilled ST to maximize intelligibility for independence, QOL, reduce caregiver burden.   OBJECTIVE IMPAIRMENTS: Objective impairments include memory and dysarthria. These impairments are limiting patient from effectively communicating at home and in community.Factors affecting potential to achieve goals and functional outcome are medical prognosis.. Patient will benefit from skilled SLP services to address above impairments and improve overall function.  REHAB POTENTIAL: Good  PLAN:  SLP FREQUENCY: 2x/week  SLP DURATION: 8 weeks  PLANNED INTERVENTIONS: Aspiration precaution training, Diet toleration management , Environmental controls, Cueing hierachy, Cognitive reorganization, Internal/external aids, Functional tasks, Multimodal communication approach, and SLP instruction and feedback MBSS    Addis Bennie, Radene Journey, CCC-SLP 07/30/2023, 11:58 AM

## 2023-07-30 NOTE — Therapy (Signed)
OUTPATIENT OCCUPATIONAL THERAPY PARKINSON'S TREATMENT  Patient Name: Joseph Lamb MRN: [id number] DOB:[birth year], 72 y.o., male Today's Date: 07/30/2023  PCP: Lupita Raider, MD REFERRING PROVIDER: Vladimir Faster, DO  END OF SESSION:  OT End of Session - 07/30/23 0952     Visit Number 2    Number of Visits 20    Date for OT Re-Evaluation 10/03/23    Authorization Type U[employer] Medicare 2024 10% coinsur NO Ded VL: [place] Auth Not Reqd    Progress Note Due on Visit 10    OT Start Time 1019    OT Stop Time 1100    OT Time Calculation (min) 41 min    Equipment Utilized During Treatment --    Activity Tolerance Patient tolerated treatment well    Behavior During Therapy [facility] for tasks assessed/performed             Past Medical History:  Diagnosis Date   Adverse effect of antihyperlipidemic and antiarteriosclerotic drugs, initial encounter 10/11/2021   Arrhythmia    Atrial fibrillation 10/11/2021   Cerebrovascular disease    moderate per 2021 MRI   Cholecystitis 09/01/2021   Chronic kidney disease, stage 3 unspecified 10/11/2021   Dizzy spells 03/08/2020   Erectile dysfunction 10/11/2021   Essential hypertension 06/14/2015   Glaucoma 08/31/2021   Hyperlipidemia 06/14/2015   Mild cognitive impairment of uncertain or unknown etiology 05/21/2023   Multiple lacunar infarcts    bilateral centrum semiovale and right parietal cortex.   Obesity 10/11/2021   Obstructive sleep apnea    no CPAP   Primary open angle glaucoma (POAG) of left eye, moderate stage 10/11/2021   Primary open-angle glaucoma, right eye, mild stage 10/11/2021   Renal insufficiency 09/06/2022   Sinus bradycardia 03/08/2020   Tremor 10/11/2021   Type II diabetes mellitus 10/11/2021   Past Surgical History:  Procedure Laterality Date   CHOLECYSTECTOMY N/A 08/31/2021   Procedure: LAPAROSCOPIC CHOLECYSTECTOMY;  Surgeon: Stechschulte, Hyman Hopes, MD;  Location: [facility] OR;  Service: General;  Laterality: N/A;   EYE  SURGERY     Glaucoma surgery   PROSTATE SURGERY     Patient Active Problem List   Diagnosis Date Noted   Mild cognitive impairment of uncertain or unknown etiology 05/21/2023   Obstructive sleep apnea    Multiple lacunar infarcts    Cerebrovascular disease    Renal insufficiency 09/06/2022   Atrial fibrillation 10/11/2021   Chronic kidney disease, stage 3 unspecified 10/11/2021   Obesity 10/11/2021   Erectile dysfunction 10/11/2021   Primary open angle glaucoma (POAG) of left eye, moderate stage 10/11/2021   Primary open-angle glaucoma, right eye, mild stage 10/11/2021   Tremor 10/11/2021   Type II diabetes mellitus 10/11/2021   Cholecystitis 09/01/2021   Glaucoma 08/31/2021   Arrhythmia    Dizzy spells 03/08/2020   Sinus bradycardia 03/08/2020   Essential hypertension 06/14/2015   Hyperlipidemia 06/14/2015    ONSET DATE: 06/19/2023  REFERRING DIAG: G20.A1 (ICD-10-CM) - Parkinson's disease without dyskinesia or fluctuating manifestations  THERAPY DIAG:  Muscle weakness (generalized)  Other lack of coordination  Other symptoms and signs involving the nervous system  Other symptoms and signs involving the musculoskeletal system  Tremors of nervous system  Rationale for Evaluation and Treatment: Rehabilitation  SUBJECTIVE:   SUBJECTIVE STATEMENT: Pt reports the PWR! Hands exercises feel good.   Pt accompanied by: self and significant other - Joseph Lamb  PERTINENT HISTORY:  PMH: PD (newly diagnosed), A fb, stage 3 CKD, HLD, HTN, multiple lacunar infarcts, obesity,  type II diabetes  Re: Dr. Arbutus Leas Neuro appt: 06/19/2023 Consultation is for the evaluation of tremor and Parkinson's.  He was first seen by Dr. Karel Jarvis in 2016 and Dr. Marjory Lies prior to that.  Initial evaluation for his further evaluation of memory change, but at that point in time his MoCA was 29/30.  He came back for further evaluation in 2021.  In the June, 2021, it was noted that patient had began to have  hand tremors for 5 months prior.  At that point in time, Dr. Karel Jarvis did not see much on the examination, however.  By August, 2022, Dr. Karel Jarvis did note occasional right thumb resting tremor and mild cogwheeling in the office.  The following visits were with Marlowe Kays.  With time, she began to note right greater than left tremors at rest, mouth tremor, sialorrhea and increased tone.  He was started on levodopa by her in February, 2024.  He apparently discontinued it due to the fact that he felt his heart was fluttering and he d/c the medication. He states that mostly he noted no difference in tremor so he d/c it.  He saw Dr. Milbert Coulter for memory testing in August, 2024, demonstrating mild cognitive impairment.  Dr. Milbert Coulter referred him to Dr. Arbutus Leas for further evaluation.   PRECAUTIONS: None  WEIGHT BEARING RESTRICTIONS: No  PAIN:  Are you having pain? Yes: NPRS scale: 4-5/10 Pain location: R hand and shoulder & abdominal Pain description: gnawing pain Aggravating factors: When I get upset/tired, eat certain foods - hot/spicy ie) hamburgers, BBQ Relieving factors: Stop the shaking in R hand/arm, Tums  FALLS: Has patient fallen in last 6 months? No  LIVING ENVIRONMENT: Lives with: lives with their spouse and lives with their son Lives in: House/apartment Stairs: Yes: External: 3 steps; bilateral but cannot reach both Has following equipment at home: shower chair and Grab bars  PLOF: Independent Sometimes needs help with buttoning & tying laces  PATIENT GOALS: To help with stiffness and dexterity in his hands/arms ie) has/had trouble turning to reach to back seat of the car, trouble with some dressing/buttons and other ADLs compared to PLOF.  OBJECTIVE:  Note: Objective measures were completed at Evaluation unless otherwise noted.  HAND DOMINANCE: Right  ADLs: Overall ADLs: Mod I Transfers/ambulation related to ADLs: Ind without AE at this time Eating: Does it himself, including cutting  food. Grooming: "I do an adequate job." Re: shaving (only missing a little bit) UB Dressing: Wife may help to pull down his shirt in the back LB Dressing: Takes extra time to tie laces Toileting: Mod I Bathing: Decent job. 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 and they were bubble packed but thinks he'll have to  Financial management: Wife is the Management consultant" Handwriting: 50% legible and Mild micrographia - see eval for sample writing  MOBILITY STATUS: Independent  POSTURE COMMENTS:  rounded shoulders and forward head  ACTIVITY TOLERANCE: Activity tolerance: Good  FUNCTIONAL OUTCOME MEASURES: Fastening/unfastening 3 buttons: 42.68 sec Physical performance test: PPT#2 (simulated eating) 15.1 sec & PPT#4 (donning/doffing jacket): 16.09 sec  UEFI = 62/80 (see  specific areas in eval)  COORDINATION: 9 Hole Peg test: Right: 36.28  sec; Left: 36.4 sec Box and Blocks:  Right 47 blocks, Left 43 blocks Tremors: Resting and Right  UE ROM:  WFL - slight end range limitations  UE MMT:   WFL Grip Strength: Right 57.5, 71.4, 75.6 Average 68.2 lbs Left 55.9, 70.5, 83.3 Average 69.9 lbs  SENSATION: WFL  MUSCLE TONE: TBA  COGNITION: Overall cognitive status: Within functional limits for tasks assessed - See ST evaluation  OBSERVATIONS: Dystonia  TODAY'S TREATMENT:                                                                                                                                OT initiated PWR! Hands as noted in pt instructions as needed to promote ROM as needed for UE  tasks.   OT educated pt on BIG ADL strategies as noted in pt instructions to help improve occupational performance. Stopped at closet recommendations.   OT educated pt on use of binder to organize his HEP and educational handouts to promote carryover.   PATIENT EDUCATION: Education details: PWR! Hands; BIG ADLs Person educated: Patient and Spouse Education method: Explanation, Demonstration, and Handouts Education comprehension: verbalized understanding, returned demonstration, and needs further education  HOME EXERCISE PROGRAM: 07/30/2023: PWR! Hands; BIG ADLs  GOALS: Goals reviewed with patient? Yes  GOALS:  SHORT TERM GOALS: Target date: 08/27/2023    Pt will be independent with PD specific HEP.  Baseline: not yet initiated Goal status: IN PROGRESS  2.  Pt will verbalize understanding of adapted strategies to maximize safety and independence with ADLs/IADLs.  Baseline: not yet initiated Goal status: INITIAL  3.  Pt will write his name/simple list with no significant decrease in size and maintain 75% legibility.  Baseline: 50% legibility Goal status: INITIAL  4.  Pt will demonstrate improved fine motor coordination for ADLs as evidenced by decreasing 9 hole peg test score for BUE and by 3-5 secs  Baseline: Right: 36.28  sec; Left: 36.4 sec Goal status: INITIAL  5.  Pt will be able to place at least 3-5 additional blocks using B hand with completion of Box and Blocks test.  Baseline: Right 47 blocks, Left 43 blocks Goal status: INITIAL   LONG TERM GOALS: Target date: 10/10/2023    Pt will verbalize understanding of ways to prevent future PD related complications and PD community resources.  Baseline: not yet initiated Goal status: INITIAL  2.   Pt will verbalize understanding of ways to keep thinking skills sharp and ways to compensate for STM changes in the future.  Baseline: not yet initiated Goal status: INITIAL  3.  Pt will demonstrate improved ease  with feeding as evidenced by decreasing PPT#2 by 2-4 secs.  Baseline: 15.1 sec Goal status: INITIAL  4.  Pt will demonstrate increased ease with dressing as evidenced by decreasing PPT#4 (don/ doff jacket) by 2-4 secs or more.  Baseline:  16.09 sec Goal status: INITIAL  5. Pt will demonstrate improved ease with fastening buttons as evidenced by decreasing 3 button/unbutton time by 2-5 seconds  Baseline: 42.68 sec seconds Goal status: INITIAL   ASSESSMENT:  CLINICAL IMPRESSION: Pt demonstrates good understanding of PD specific strategies as needed to progress towards goals. Pt has been encouraged to put together a folder for all his exercises and educational materials.   PERFORMANCE DEFICITS: in functional skills including ADLs, IADLs, coordination, dexterity, tone, ROM, strength, pain, flexibility, Fine motor control, Gross motor control, balance, endurance, decreased knowledge of precautions, decreased knowledge of use of DME, and UE functional use, cognitive skills including safety awareness, and psychosocial skills including coping strategies and environmental adaptation.   IMPAIRMENTS: are limiting patient from ADLs, IADLs, and leisure.   COMORBIDITIES:  may have co-morbidities  that affects occupational performance. Patient will benefit from skilled OT to address above impairments and improve overall function.  REHAB POTENTIAL: Excellent  PLAN:  OT FREQUENCY: 2x/week  OT DURATION: 10 weeks  PLANNED INTERVENTIONS: 97535 self care/ADL training, 16109 therapeutic exercise, 97530 therapeutic activity, 97112 neuromuscular re-education, functional mobility training, psychosocial skills training, energy conservation, coping strategies training, patient/family education, and DME and/or AE instructions  RECOMMENDED OTHER SERVICES: Pt received PT and ST evaluations already and appts in place for all 3 services  CONSULTED AND AGREED WITH PLAN OF CARE: Patient and family  member/caregiver  PLAN FOR NEXT SESSION:   Review ADL strategies starting with closet strategy.  Work on Building services engineer (HANDS) and progress as appropriate   Delana Meyer, OT 07/30/2023, 1:48 PM

## 2023-08-04 ENCOUNTER — Encounter: Payer: Self-pay | Admitting: Physical Therapy

## 2023-08-04 ENCOUNTER — Ambulatory Visit: Payer: Medicare Other | Admitting: Occupational Therapy

## 2023-08-04 ENCOUNTER — Ambulatory Visit: Payer: Medicare Other | Admitting: Speech Pathology

## 2023-08-04 ENCOUNTER — Encounter: Payer: Self-pay | Admitting: Speech Pathology

## 2023-08-04 ENCOUNTER — Ambulatory Visit: Payer: Medicare Other | Admitting: Physical Therapy

## 2023-08-04 VITALS — BP 122/75 | HR 73

## 2023-08-04 DIAGNOSIS — R2689 Other abnormalities of gait and mobility: Secondary | ICD-10-CM

## 2023-08-04 DIAGNOSIS — M6281 Muscle weakness (generalized): Secondary | ICD-10-CM

## 2023-08-04 DIAGNOSIS — R471 Dysarthria and anarthria: Secondary | ICD-10-CM

## 2023-08-04 DIAGNOSIS — R2681 Unsteadiness on feet: Secondary | ICD-10-CM | POA: Diagnosis not present

## 2023-08-04 NOTE — Therapy (Signed)
OUTPATIENT PHYSICAL THERAPY NEURO TREATMENT   Patient Name: Joseph Lamb MRN: [id number] DOB:1950/11/23, 72 y.o., male Today's Date: 08/04/2023   PCP: Lupita Raider, MD  REFERRING PROVIDER:  Vladimir Faster, DO  END OF SESSION:  PT End of Session - 08/04/23 0937     Visit Number 5    Number of Visits 13    Date for PT Re-Evaluation 09/07/23    Authorization Type [employer] Medicare    PT Start Time 0935    PT Stop Time 1015    PT Time Calculation (min) 40 min    Equipment Utilized During Treatment Gait belt    Activity Tolerance Patient tolerated treatment well    Behavior During Therapy [facility] for tasks assessed/performed             Past Medical History:  Diagnosis Date   Adverse effect of antihyperlipidemic and antiarteriosclerotic drugs, initial encounter 10/11/2021   Arrhythmia    Atrial fibrillation 10/11/2021   Cerebrovascular disease    moderate per 2021 MRI   Cholecystitis 09/01/2021   Chronic kidney disease, stage 3 unspecified 10/11/2021   Dizzy spells 03/08/2020   Erectile dysfunction 10/11/2021   Essential hypertension 06/14/2015   Glaucoma 08/31/2021   Hyperlipidemia 06/14/2015   Mild cognitive impairment of uncertain or unknown etiology 05/21/2023   Multiple lacunar infarcts    bilateral centrum semiovale and right parietal cortex.   Obesity 10/11/2021   Obstructive sleep apnea    no CPAP   Primary open angle glaucoma (POAG) of left eye, moderate stage 10/11/2021   Primary open-angle glaucoma, right eye, mild stage 10/11/2021   Renal insufficiency 09/06/2022   Sinus bradycardia 03/08/2020   Tremor 10/11/2021   Type II diabetes mellitus 10/11/2021   Past Surgical History:  Procedure Laterality Date   CHOLECYSTECTOMY N/A 08/31/2021   Procedure: LAPAROSCOPIC CHOLECYSTECTOMY;  Surgeon: Stechschulte, Hyman Hopes, MD;  Location: [facility] OR;  Service: General;  Laterality: N/A;   EYE SURGERY     Glaucoma surgery   PROSTATE SURGERY     Patient Active  Problem List   Diagnosis Date Noted   Mild cognitive impairment of uncertain or unknown etiology 05/21/2023   Obstructive sleep apnea    Multiple lacunar infarcts    Cerebrovascular disease    Renal insufficiency 09/06/2022   Atrial fibrillation 10/11/2021   Chronic kidney disease, stage 3 unspecified 10/11/2021   Obesity 10/11/2021   Erectile dysfunction 10/11/2021   Primary open angle glaucoma (POAG) of left eye, moderate stage 10/11/2021   Primary open-angle glaucoma, right eye, mild stage 10/11/2021   Tremor 10/11/2021   Type II diabetes mellitus 10/11/2021   Cholecystitis 09/01/2021   Glaucoma 08/31/2021   Arrhythmia    Dizzy spells 03/08/2020   Sinus bradycardia 03/08/2020   Essential hypertension 06/14/2015   Hyperlipidemia 06/14/2015    ONSET DATE: 06/19/2023  REFERRING DIAG: G20.A1 (ICD-10-CM) - Parkinson's disease without dyskinesia or fluctuating manifestations (HCC)   THERAPY DIAG:  Muscle weakness (generalized)  Unsteadiness on feet  Other abnormalities of gait and mobility  Rationale for Evaluation and Treatment: Rehabilitation  SUBJECTIVE:  SUBJECTIVE STATEMENT: Patient reports that he is doing well; he denies any falls/near falls as well as changes to medications. States shaking is still annoying. Reports exercises are going well as home.   Pt accompanied by:  Spouse, Joseph Lamb  PERTINENT HISTORY: PMH: PD,  A fb, stage 3 CKD, HLD, HTN, multiple lacunar infarcts, obesity, type II diabetes    Per Dr. Arbutus Leas:  Idiopathic Parkinson's disease, moderate             -Diagnosis was recently made, but likely has had this for a number of years  PAIN:  Are you having pain? No  Vitals:   08/04/23 0942  BP: 122/75  Pulse: 73    PRECAUTIONS: Fall  RED FLAGS: None   WEIGHT  BEARING RESTRICTIONS: No  FALLS: Has patient fallen in last 6 months? No  LIVING ENVIRONMENT: Lives with: lives with their spouse Lives in: House/apartment Stairs: Yes: External: 3 steps; bilateral but cannot reach both Has following equipment at home: None Has trouble getting up from sitting in the bathtub   PLOF: Independent Sometimes needs help with buttoning   PATIENT GOALS: Slow down the progression, maintain as much as possible.   OBJECTIVE:  Note: Objective measures were completed at Evaluation unless otherwise noted.  COGNITION: Overall cognitive status: Within functional limits for tasks assessed and Impaired Neurocognitive testing in February, 2024 with evidence of mild cognitive impairment. This was largely stable compared to prior testing with Dr. Roseanne Reno in 2021.    SENSATION: Light touch: WFL Pt reports sometimes numbness/tingling when getting up in the morning   COORDINATION: Heel to shin: WNL   MUSCLE TONE: Noted more stiffness in LLE    POSTURE: rounded shoulders and forward head  RUE resting tremors   LOWER EXTREMITY ROM:     Noted slightly decr R knee extension AROM   LOWER EXTREMITY MMT:    MMT Right Eval Left Eval  Hip flexion 5 5  Hip extension    Hip abduction    Hip adduction    Hip internal rotation    Hip external rotation    Knee flexion 5 5  Knee extension 5 5  Ankle dorsiflexion 5 5  Ankle plantarflexion    Ankle inversion    Ankle eversion    (Blank rows = not tested)  BED MOBILITY:  Pt reports no difficulty   TRANSFERS: Assistive device utilized: None  Sit to stand: SBA Stand to sit: SBA Performs with no UE support   Pt notes difficulty getting in and out of the car, with bringing the legs in.   Pt reports difficulty getting up off the couch, sometimes will take a couple of tries.    FUNCTIONAL TESTS:  5 times sit to stand: 14 seconds with no UE support  10 meter walk test: 10.91 seconds = 3.01 ft/sec     TODAY'S TREATMENT:  Vitals:   08/04/23 0942  BP: 122/75  Pulse: 73   NMR 8lb med ball slams with large amplitude stepping and full overhead reach to maximize postural control and anticipatory balnace 2 x 20 feet 8lb med ball slams with large amplitude stepping with NBOS and rotation of trunk and full overhead reach to maximize postural control and anticipatory balnace 2 x 10 feet Standing on airex with green theraband for increased UE challenge while maintaining amplitude of movement, mirroring therapist as needed Backward step with shoulder extension with clock on wall as external visual target to maintain upright posture 1 x 10 bil, 1 x 10 bil alt Lateral step with shoulder horizontal abduction with clock on wall as external visual target to maintain upright posture 1 x 10 bil, 1 x 10 bil alt Lateral reaching cross body with taps to external target in semitandem on balance beam 2 x 6 taps leading with each leg  PATIENT EDUCATION: Education details: Continue HEP + will plan to give more resources next session  Person educated: Patient and Spouse Education method: Explanation, Demonstration, and Verbal cues Education comprehension: verbalized understanding, returned demonstration, and needs further education  HOME EXERCISE PROGRAM: Standing PWR moves   Access Code: [email or web address] URL: [email or web address] Date: 07/30/2023 Prepared by: Sherlie Ban  Exercises - Staggered Stance Weight Shift with Arms Reaching  - 1-2 x daily - 5 x weekly - 2 sets - 10 reps  GOALS: Goals reviewed with patient? Yes  SHORT TERM GOALS:  Target date: 08/06/2023  Pt will be independent with initial HEP for PD specific deficits in order to build upon functional gains made in therapy. Baseline: Goal status: INITIAL  2.  Pt will perform posterior stepping  strategy in 3 steps or less in order to demo improved balance strategies.  Baseline: at least 5 steps, min A for balance  Goal status: INITIAL  3.  Pt will improve 5x sit<>stand to less than or equal to 12 sec to demonstrate improved functional strength and transfer efficiency.   Baseline: 14 seconds with no UE support Goal status: INITIAL   LONG TERM GOALS: Target date: 09/03/2023   Pt will be independent with final  HEP for PD specific deficits in order to build upon functional gains made in therapy. Baseline:  Goal status: INITIAL  2.  Pt will improve miniBEST to at least a 22/28 in order to demo decr fall risk Baseline: 17/28 Goal status: INITIAL  3.  Pt will verbalize understanding of local Parkinson's disease resources, including options for continue community fitness.  Baseline:  Goal status: INITIAL  4.  Pt will improve TUG and TUG cog score to less than or equal to 10% difference for improved dual task/decreased fall risk.  Baseline:  Goal status: INITIAL  5.  Pt will get up from lower/softer surfaces to mimic a couch/recliner mod I in order to demo improved transfers  Baseline:  Goal status: INITIAL    ASSESSMENT:  CLINICAL IMPRESSION:  Today's skilled session focused on larger amplitude and balance strategies with focus on weight shifting, trunk rotation, and gentle strengthening. Pt required cueing for full step up onto airex pad but benefitted from use of mirror on wall for visual target. Patient with only 2x minor instability with med ball slams with anticipatory balance strategies. Will continue per POC.   OBJECTIVE IMPAIRMENTS: Abnormal gait, decreased activity tolerance, decreased balance, decreased cognition, decreased coordination, decreased mobility, difficulty walking, decreased strength, impaired flexibility, impaired UE functional use, and postural dysfunction.  ACTIVITY LIMITATIONS: carrying, bending, stairs, transfers, dressing, and locomotion  level  PARTICIPATION LIMITATIONS: community activity  PERSONAL FACTORS: Age, Behavior pattern, Past/current experiences, Time since onset of injury/illness/exacerbation, and 3+ comorbidities:  PD,  A fb, stage 3 CKD, HLD, HTN, multiple lacunar infarcts, obesity, type II diabetes   are also affecting patient's functional outcome.   REHAB POTENTIAL: Good  CLINICAL DECISION MAKING: Evolving/moderate complexity  EVALUATION COMPLEXITY: Moderate  PLAN:  PT FREQUENCY: 2x/week  PT DURATION: 8 weeks  PLANNED INTERVENTIONS: Therapeutic exercises, Therapeutic activity, Neuromuscular re-education, Balance training, Gait training, Patient/Family education, Self Care, Joint mobilization, Stair training, DME instructions, and Re-evaluation  PLAN FOR NEXT SESSION: work on SLS, stepping strategies, larger amplitude movement patterns . SciFit for reciprocal movement, endurance. Gait exercises with arm swing, resisted gait with pertubations   Check STGs, give Parkinson community resources as family interested in exercise class after therapy   Carmelia Bake, PT, DPT 08/04/2023, 12:08 PM

## 2023-08-04 NOTE — Therapy (Signed)
OUTPATIENT SPEECH LANGUAGE PATHOLOGY PARKINSON'S TREATMENT   Patient Name: Joseph Lamb MRN: [id number] DOB:[birth year], 72 y.o., male Today's Date: 08/04/2023  PCP: Lupita Raider MD REFERRING PROVIDER: Vladimir Faster, DO  END OF SESSION:  End of Session - 08/04/23 1019     Visit Number 5    Number of Visits 17    Date for SLP Re-Evaluation 09/03/23    SLP Start Time 1015    SLP Stop Time  1100    SLP Time Calculation (min) 45 min              Past Medical History:  Diagnosis Date   Adverse effect of antihyperlipidemic and antiarteriosclerotic drugs, initial encounter 10/11/2021   Arrhythmia    Atrial fibrillation 10/11/2021   Cerebrovascular disease    moderate per 2021 MRI   Cholecystitis 09/01/2021   Chronic kidney disease, stage 3 unspecified 10/11/2021   Dizzy spells 03/08/2020   Erectile dysfunction 10/11/2021   Essential hypertension 06/14/2015   Glaucoma 08/31/2021   Hyperlipidemia 06/14/2015   Mild cognitive impairment of uncertain or unknown etiology 05/21/2023   Multiple lacunar infarcts    bilateral centrum semiovale and right parietal cortex.   Obesity 10/11/2021   Obstructive sleep apnea    no CPAP   Primary open angle glaucoma (POAG) of left eye, moderate stage 10/11/2021   Primary open-angle glaucoma, right eye, mild stage 10/11/2021   Renal insufficiency 09/06/2022   Sinus bradycardia 03/08/2020   Tremor 10/11/2021   Type II diabetes mellitus 10/11/2021   Past Surgical History:  Procedure Laterality Date   CHOLECYSTECTOMY N/A 08/31/2021   Procedure: LAPAROSCOPIC CHOLECYSTECTOMY;  Surgeon: Stechschulte, Hyman Hopes, MD;  Location: [facility] OR;  Service: General;  Laterality: N/A;   EYE SURGERY     Glaucoma surgery   PROSTATE SURGERY     Patient Active Problem List   Diagnosis Date Noted   Mild cognitive impairment of uncertain or unknown etiology 05/21/2023   Obstructive sleep apnea    Multiple lacunar infarcts    Cerebrovascular disease     Renal insufficiency 09/06/2022   Atrial fibrillation 10/11/2021   Chronic kidney disease, stage 3 unspecified 10/11/2021   Obesity 10/11/2021   Erectile dysfunction 10/11/2021   Primary open angle glaucoma (POAG) of left eye, moderate stage 10/11/2021   Primary open-angle glaucoma, right eye, mild stage 10/11/2021   Tremor 10/11/2021   Type II diabetes mellitus 10/11/2021   Cholecystitis 09/01/2021   Glaucoma 08/31/2021   Arrhythmia    Dizzy spells 03/08/2020   Sinus bradycardia 03/08/2020   Essential hypertension 06/14/2015   Hyperlipidemia 06/14/2015    ONSET DATE: 06/19/2023(referral date)  REFERRING DIAG:  Diagnosis  G20.A1 (ICD-10-CM) - Parkinson's disease without dyskinesia or fluctuating manifestations (HCC)    THERAPY DIAG:  Dysarthria and anarthria  Rationale for Evaluation and Treatment: Rehabilitation  SUBJECTIVE:   SUBJECTIVE STATEMENT: 'I have been doing them twice a day. I'm on Lesson 9" Pt accompanied by: significant other  PERTINENT HISTORY: From Dr. Don Perking consult: [name] was seen in the movement disorders clinic for neurologic consultation at the request of Rosann Auerbach, PhD.  The consultation is for the evaluation of tremor and Parkinson's.  Pt with wife who supplements hx.  Patient has been seen in our clinic for a long time.  He was first seen by Dr. Karel Jarvis in 2016 and Dr. Marjory Lies prior to that.  Initial evaluation for his further evaluation of memory change, but at that point in time his MoCA was  29/30.  He came back for further evaluation in 2021.  In the June, 2021 evaluation, it is noted that patient had begun to have hand tremors for 5 months prior.  At that point in time, Dr. Karel Jarvis did not see much on the examination, however.  By August, 2022, Dr. Karel Jarvis did note occasional right thumb resting tremor and mild cogwheeling in the office.  The following visits were with Marlowe Kays.  With time, she began to note right greater than left  tremors at rest, mouth tremor, sialorrhea and increased tone.  He was started on levodopa by her in February, 2024.  He apparently discontinued it due to the fact that he felt his heart was fluttering and he d/c the medication.  .he thinks that he took it for a months.  He states that mostly he noted no difference in tremor so he d/c it.  he saw Dr. Milbert Coulter for memory testing in August, 2024, demonstrating mild cognitive impairment.  PAIN:  Are you having pain? No  FALLS: Has patient fallen in last 6 months?  See PT evaluation for details  LIVING ENVIRONMENT: Lives with: lives with their spouse and lives with their son Lives in: House/apartment  PLOF:  Level of assistance: Independent with ADLs, Independent with IADLs Employment: Retired  PATIENT GOALS: "To talk better"  OBJECTIVE:   TODAY'S TREATMENT:                                                                                                                                         DATE:   08/04/23: [name] reports consistently completing each lesson in [employer] Workbook twice a day. He enters knowing that he is on Lesson 9. Today we used an Theatre stage manager from [employer] eLibrary (November week 1) [name] required occasional min verbal and visual cues to hold Ah for 10 seconds. He hit all dB targets in the lesson with rare min A (occasional min A for conversation exercises - divergent naming) In simple conversation Hassen required occasional min verbal and gesture cues to maintain 72dB - volume decreases and cognitive load increases - Calieb verbalizes awareness of slow processing. Education provided for ways family members can assist him in participating in conversations with small groups of family. He told his other son about his PD - they endorsed that he will watch the "what is Parkinson's" webinar on speak out! Website.   10/23/24[name] reports completing [employer]! 1x - continue to require him to complete this twice a day. He self correct  intent voice 3x with supervision cues in initial conversation.  Targeted volume and intelligibility using [employer]! Lesson 6. Pt required rare min  verbal cues, modeling, for volume and breath support. Pt averages the following volume levels:  Sustained AH: 93 dB  Counting:87  Reading (phrases): 87dB  Cognitive Exercise: 75dB Required usual mod verbal cues, modeling for carryover of intent /  volume completing and explaining idioms   07/28/23: Kainan reports completing [employer]! Through lesson 5, however he did not complete each lesson twice. I reviewed requirement to complete each lesson 2x, twice a day. We completed Lesson 5 today.  Targeted volume and intelligibility using [employer] Lesson 5. Pt required occasional min  verbal cues, modeling, for volume and breath support.  Pt averages the following volume levels:  Sustained AH: 90dB  Counting:84dB  Reading (phrases): 80dB  Cognitive Exercise: 78dB Required usual min to mod verbal cues, modeling for carryover of intent /volume on divergent naming task. Usual min semantic cues to generate 5-8 items in simple categories. In simple description re: holiday traditions, Jonnathon averaged 72dB with usual mod modeling, verbal and gesture cues.    07/23/23: Alphons brings [employer] Workbook to the session. He and [name] have watched the Learn About Parkinson's webinar on [employer]. Instructed them to have their son watch it as well. Nicolis endorses "I have been speaking low for a long time, I didn't realize it"  Previewed the workbook with them, introduced requirement of twice daily practice, advancing lessons after 2 sessions. Demonstrated online Spe[employer]! Sessions. Targeted volume and intelligibility using [employer] Lesson 1. Pt required usual to  occasional min verbal cues and modeling, for volume and breath support.  Pt averages the following volume levels  Sustained AH: 85dB  Counting:85dB  Reading (phrases):  75dB  Cognitive Exercise: 72dB Required usual mod verbal cues, modeling for carryover of intent /volume generating 3 sentence descriptions of basic objects.  following structured practice. In simple conversation over 5 minutes, Tyriek averaged 70dB with usual mod visual and verbal cues.    07/09/23 (eval day): Introduced [employer]! Workbook to [employer]. Ordered work book - Introduced them to [employer], demonstrated location of Learn about [employer] and on Physiological scientist. Instructed them to watch webinar and an online practice session and have their 2 sons watch the webinar as well. Educated re: Automatic vs Intentional motor systems. [name] Id'd when he was speaking or reading with intent vs not using intent. By the end of the session, he was conversing with intent to average 70dB with occasional min a  PATIENT EDUCATION: Education details: See Today's Treatment; See patient instructions; HEP for dysarthria; PD education Person educated: Patient and Spouse Education method: Explanation, Demonstration, Verbal cues, and Handouts Education comprehension: verbalized understanding, returned demonstration, verbal cues required, and needs further education  HOME EXERCISE PROGRAM: [employer]! Workbook ordered - they are to bring it to ST when it arrives   GOALS: Goals reviewed with patient? Yes  SHORT TERM GOALS: Target date: 08/06/23  Pt will complete HEP hitting target dB's with occasional min A over 1 week Baseline: Goal status: MET  2.  Pt will complete HEP twice a day, completing at least 1 on line session with [employer] website Baseline:  Goal status: MET  3.  Pt will averge 72dB 18/20 sentences with rare min A Baseline:  Goal status: MET  4.  Pt will average 72dB over 12 minute converation with rare min A Baseline:  Goal status: ONGOING  5.  Pt and his family will watch Learn about Parkinson's webinar on [employer]  website Baseline:  Goal status: MET              LONG TERM GOALS: Target date: 09/03/23  Pt will complete HEP for dysarthria twice daily accurately over 1 week with  mod I Baseline:  Goal status: ONGOING  2.  Pt will complete 2 on line practice sessions from [employer] online Baseline:  Goal status: ONGOING  3.  Pt will maintain 70dB over 15 minute conversation with mod I Baseline:  Goal status: ONGOING  4.  Pt will improve score on Communicative Effectiveness Survey by 3 points Baseline: 19 - spouse rated due to reduced pt awareness Goal status: ONGOING  5.  Pt/spouse will verbalize requirement to continue Speak [employer]! Daily practice with workbook and online up d/c of ST Baseline:  Goal status: ONGOING   ASSESSMENT:  CLINICAL IMPRESSION: Patient is a 72 y.o. 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 He is accompanied by his spouse, Teryl Lucy. Yuma verbalizes reduced awareness of low volume, stating his spouse needs a hearing aid. He does endorse he is "a little low" at times. [name] reports frequent requests for repetition and difficulty hearing [place]. They deny difficulty swallowing, coughing or getting choked or strangled. Meds are swallowed without difficulty. They report some memory issues, such as walking into a room and forgetting why, however they both feel that Jerson' current cognitive function is acceptable for day to day activities. Carliss' volume improved significantly after initial training for speak with intent and he demonstrated carry over during simple short conversation at the end of the evaluation. I recommend skilled ST to maximize intelligibility for independence, QOL, reduce caregiver burden.   OBJECTIVE IMPAIRMENTS: Objective impairments include memory and dysarthria. These impairments are limiting patient from effectively communicating at home and in community.Factors affecting potential to achieve goals and functional  outcome are medical prognosis.. Patient will benefit from skilled SLP services to address above impairments and improve overall function.  REHAB POTENTIAL: Good  PLAN:  SLP FREQUENCY: 2x/week  SLP DURATION: 8 weeks  PLANNED INTERVENTIONS: Aspiration precaution training, Diet toleration management , Environmental controls, Cueing hierachy, Cognitive reorganization, Internal/external aids, Functional tasks, Multimodal communication approach, and SLP instruction and feedback MBSS    Aly Seidenberg, Radene Journey, CCC-SLP 08/04/2023, 12:14 PM

## 2023-08-06 ENCOUNTER — Encounter: Payer: Self-pay | Admitting: Occupational Therapy

## 2023-08-06 ENCOUNTER — Encounter: Payer: Self-pay | Admitting: Physical Therapy

## 2023-08-06 ENCOUNTER — Encounter: Payer: Self-pay | Admitting: Speech Pathology

## 2023-08-06 ENCOUNTER — Ambulatory Visit: Payer: Medicare Other | Admitting: Speech Pathology

## 2023-08-06 ENCOUNTER — Ambulatory Visit: Payer: Medicare Other | Admitting: Physical Therapy

## 2023-08-06 ENCOUNTER — Ambulatory Visit: Payer: Medicare Other | Admitting: Occupational Therapy

## 2023-08-06 DIAGNOSIS — M6281 Muscle weakness (generalized): Secondary | ICD-10-CM

## 2023-08-06 DIAGNOSIS — R2689 Other abnormalities of gait and mobility: Secondary | ICD-10-CM

## 2023-08-06 DIAGNOSIS — R2681 Unsteadiness on feet: Secondary | ICD-10-CM

## 2023-08-06 DIAGNOSIS — R471 Dysarthria and anarthria: Secondary | ICD-10-CM

## 2023-08-06 DIAGNOSIS — R29818 Other symptoms and signs involving the nervous system: Secondary | ICD-10-CM

## 2023-08-06 DIAGNOSIS — R278 Other lack of coordination: Secondary | ICD-10-CM

## 2023-08-06 NOTE — Therapy (Signed)
OUTPATIENT OCCUPATIONAL THERAPY PARKINSON'S TREATMENT  Patient Name: Joseph Lamb MRN: [id number] DOB:06-04-1951, 72 y.o., male Today's Date: 08/06/2023  PCP: Lupita Raider, MD REFERRING PROVIDER: Vladimir Faster, DO  END OF SESSION:  OT End of Session - 08/06/23 1104     Visit Number 3    Number of Visits 20    Date for OT Re-Evaluation 10/03/23    Authorization Type [employer] Medicare 2024 10% coinsur NO Ded VL: [place] Auth Not Reqd    Progress Note Due on Visit 10    OT Start Time 1102    OT Stop Time 1145    OT Time Calculation (min) 43 min    Activity Tolerance Patient tolerated treatment well    Behavior During Therapy [facility] for tasks assessed/performed             Past Medical History:  Diagnosis Date   Adverse effect of antihyperlipidemic and antiarteriosclerotic drugs, initial encounter 10/11/2021   Arrhythmia    Atrial fibrillation 10/11/2021   Cerebrovascular disease    moderate per 2021 MRI   Cholecystitis 09/01/2021   Chronic kidney disease, stage 3 unspecified 10/11/2021   Dizzy spells 03/08/2020   Erectile dysfunction 10/11/2021   Essential hypertension 06/14/2015   Glaucoma 08/31/2021   Hyperlipidemia 06/14/2015   Mild cognitive impairment of uncertain or unknown etiology 05/21/2023   Multiple lacunar infarcts    bilateral centrum semiovale and right parietal cortex.   Obesity 10/11/2021   Obstructive sleep apnea    no CPAP   Primary open angle glaucoma (POAG) of left eye, moderate stage 10/11/2021   Primary open-angle glaucoma, right eye, mild stage 10/11/2021   Renal insufficiency 09/06/2022   Sinus bradycardia 03/08/2020   Tremor 10/11/2021   Type II diabetes mellitus 10/11/2021   Past Surgical History:  Procedure Laterality Date   CHOLECYSTECTOMY N/A 08/31/2021   Procedure: LAPAROSCOPIC CHOLECYSTECTOMY;  Surgeon: Stechschulte, Hyman Hopes, MD;  Location: [facility] OR;  Service: General;  Laterality: N/A;   EYE SURGERY     Glaucoma surgery   PROSTATE  SURGERY     Patient Active Problem List   Diagnosis Date Noted   Mild cognitive impairment of uncertain or unknown etiology 05/21/2023   Obstructive sleep apnea    Multiple lacunar infarcts    Cerebrovascular disease    Renal insufficiency 09/06/2022   Atrial fibrillation 10/11/2021   Chronic kidney disease, stage 3 unspecified 10/11/2021   Obesity 10/11/2021   Erectile dysfunction 10/11/2021   Primary open angle glaucoma (POAG) of left eye, moderate stage 10/11/2021   Primary open-angle glaucoma, right eye, mild stage 10/11/2021   Tremor 10/11/2021   Type II diabetes mellitus 10/11/2021   Cholecystitis 09/01/2021   Glaucoma 08/31/2021   Arrhythmia    Dizzy spells 03/08/2020   Sinus bradycardia 03/08/2020   Essential hypertension 06/14/2015   Hyperlipidemia 06/14/2015    ONSET DATE: 06/19/2023  REFERRING DIAG: G20.A1 (ICD-10-CM) - Parkinson's disease 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 Arbutus Leas Neuro appt: 06/19/2023 Consultation is for the evaluation of tremor and Parkinson's.  He was first seen by Dr. Karel Jarvis in 2016 and Dr. Marjory Lies prior to that.  Initial evaluation for his further evaluation of memory change, but at that point in time his MoCA was 29/30.  He came back for further evaluation in 2021.  In the June, 2021, it was noted that patient had began to have hand tremors for 5 months prior.  At that point in time, Dr. Karel Jarvis did not see much on the examination, however.  By August,  2022, Dr. Karel Jarvis did note occasional right thumb resting tremor and mild cogwheeling in the office.  The following visits were with Marlowe Kays.  With time, she began to note right greater than left tremors at rest, mouth tremor, sialorrhea and increased tone.  He was started on levodopa by her in February, 2024.  He apparently discontinued it due to the fact that he felt his heart was fluttering and he d/c the medication. He states that mostly he noted no difference in tremor so he d/c it.  He saw Dr. Milbert Coulter for memory testing in August, 2024, demonstrating mild cognitive impairment.  Dr. Milbert Coulter referred him to Dr. Arbutus Leas for further evaluation.   PRECAUTIONS: None  WEIGHT BEARING RESTRICTIONS: No  PAIN:  Are you having pain? Yes: NPRS scale: 4-5/10 Pain location: R hand and shoulder & abdominal Pain description: gnawing pain Aggravating factors: When I get upset/tired, eat certain foods - hot/spicy ie) hamburgers, BBQ Relieving factors: Stop the shaking in R hand/arm, Tums  FALLS: Has patient fallen in last 6 months? No  LIVING ENVIRONMENT: Lives with: lives with their spouse and lives with their son Lives in: House/apartment Stairs: Yes: External: 3 steps; bilateral but cannot reach both Has following equipment at home: shower chair and Grab bars  PLOF: Independent Sometimes needs help with buttoning & tying laces  PATIENT GOALS: To help with stiffness and dexterity in his hands/arms ie) has/had trouble turning to reach to back seat of the car, trouble with some dressing/buttons and other ADLs compared to PLOF.  OBJECTIVE:  Note: Objective measures were completed at Evaluation unless otherwise noted.  HAND DOMINANCE: Right  ADLs: Overall ADLs: Mod I Transfers/ambulation related to ADLs: Ind without AE at this time Eating: Does it himself, including cutting food. Grooming: "I do an adequate job." Re: shaving (only missing a little bit) UB Dressing: Wife may help to pull down his shirt  in the back LB Dressing: Takes extra time to tie laces Toileting: Mod I Bathing: Decent job. 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 and they were bubble packed but thinks he'll have to  Financial management: Wife is the Management consultant" Handwriting: 50% legible and Mild micrographia - see eval for sample writing  MOBILITY STATUS: Independent  POSTURE COMMENTS:  rounded shoulders and forward head  ACTIVITY TOLERANCE: Activity tolerance: Good  FUNCTIONAL OUTCOME MEASURES: Fastening/unfastening 3 buttons: 42.68 sec Physical performance test: PPT#2 (simulated eating) 15.1 sec & PPT#4 (donning/doffing jacket): 16.09 sec  UEFI = 62/80 (see specific areas in eval)  COORDINATION: 9 Hole Peg test: Right: 36.28  sec; Left: 36.4 sec Box and Blocks:  Right 47 blocks, Left 43 blocks Tremors: Resting and Right  UE ROM:  WFL - slight end range limitations  UE MMT:   WFL Grip Strength: Right 57.5, 71.4, 75.6 Average 68.2 lbs Left 55.9, 70.5, 83.3 Average 69.9 lbs  SENSATION: WFL  MUSCLE TONE: TBA  COGNITION: Overall cognitive status: Within functional limits for tasks assessed - See ST evaluation  OBSERVATIONS: Dystonia  TODAY'S TREATMENT:                                                                                                                                Reviewed PWR! Hands as noted in pt instructions as needed to promote ROM as needed for UE tasks. Mod cues to open other fingers big with PWR! Step, for full wrist ext with PWR! Rock and push, and full supination for [employer]!  Twist  OT educated pt on BIG ADL strategies for reaching in pocket, donning/doffing jacket using cape method, opening twist lids, grabbing items big in palm, getting up from table, big arm swings with walking and handwriting. Practiced cape method x 3 for jacket  Issued handwriting strategies and reviewed. Pt practiced with greater success. Cued pt to slide forearm across page after each word which also helped.    PATIENT EDUCATION: Education details: Review of PWR! Hands and BIG ADLs, handwriting strategies Person educated: Patient and Spouse Education method: Explanation, Demonstration, and Handouts Education comprehension: verbalized understanding, returned demonstration, and needs further education  HOME EXERCISE PROGRAM: 07/30/2023: PWR! Hands; BIG ADLs 08/06/23: handwriting strategies  GOALS: Goals reviewed with patient? Yes  GOALS:  SHORT TERM GOALS: Target date: 08/27/2023    Pt will be independent with PD specific HEP.  Baseline: not yet initiated Goal status: IN PROGRESS  2.  Pt will verbalize understanding of adapted strategies to maximize safety and independence with ADLs/IADLs.  Baseline: not yet initiated Goal status: INITIAL  3.  Pt will write his name/simple list with no significant decrease in size and maintain 75% legibility.  Baseline: 50% legibility Goal status: INITIAL  4.  Pt will demonstrate improved fine motor coordination for ADLs as evidenced by decreasing 9 hole peg test score for BUE and by 3-5 secs  Baseline: Right: 36.28  sec; Left: 36.4 sec Goal status: INITIAL  5.  Pt will be able to place at least 3-5 additional blocks using B hand with completion of Box and Blocks test.  Baseline: Right 47 blocks, Left 43 blocks Goal status: INITIAL   LONG TERM GOALS: Target date: 10/10/2023    Pt will verbalize understanding of ways to prevent future PD related complications and PD community resources.  Baseline: not yet initiated Goal status:  INITIAL  2.   Pt will verbalize understanding of ways to keep thinking skills sharp and ways to compensate for STM changes in the future.  Baseline: not yet initiated Goal status: INITIAL  3.  Pt will demonstrate improved ease with feeding as evidenced by  decreasing PPT#2 by 2-4 secs.  Baseline: 15.1 sec Goal status: INITIAL  4.  Pt will demonstrate increased ease with dressing as evidenced by decreasing PPT#4 (don/ doff jacket) by 2-4 secs or more.  Baseline: 16.09 sec Goal status: INITIAL  5. Pt will demonstrate improved ease with fastening buttons as evidenced by decreasing 3 button/unbutton time by 2-5 seconds  Baseline: 42.68 sec seconds Goal status: INITIAL   ASSESSMENT:  CLINICAL IMPRESSION: Pt demonstrates good understanding of PD specific strategies as needed to progress towards goals. Pt responds well to large amplitude cueing with review/repetition   PERFORMANCE DEFICITS: in functional skills including ADLs, IADLs, coordination, dexterity, tone, ROM, strength, pain, flexibility, Fine motor control, Gross motor control, balance, endurance, decreased knowledge of precautions, decreased knowledge of use of DME, and UE functional use, cognitive skills including safety awareness, and psychosocial skills including coping strategies and environmental adaptation.   IMPAIRMENTS: are limiting patient from ADLs, IADLs, and leisure.   COMORBIDITIES:  may have co-morbidities  that affects occupational performance. Patient will benefit from skilled OT to address above impairments and improve overall function.  REHAB POTENTIAL: Excellent  PLAN:  OT FREQUENCY: 2x/week  OT DURATION: 10 weeks  PLANNED INTERVENTIONS: 97535 self care/ADL training, 84166 therapeutic exercise, 97530 therapeutic activity, 97112 neuromuscular re-education, functional mobility training, psychosocial skills training, energy conservation, coping strategies training, patient/family education, and DME and/or  AE instructions  RECOMMENDED OTHER SERVICES: Pt received PT and ST evaluations already and appts in place for all 3 services  CONSULTED AND AGREED WITH PLAN OF CARE: Patient and family member/caregiver  PLAN FOR NEXT SESSION:   Print cape method for jacket off and issue PWR! Seated    Sheran Lawless, OT 08/06/2023, 11:04 AM

## 2023-08-06 NOTE — Therapy (Signed)
OUTPATIENT PHYSICAL THERAPY NEURO TREATMENT   Patient Name: Joseph Lamb MRN: [id number] DOB:[birth year], 72 y.o., male Today's Date: 08/06/2023   PCP: Lupita Raider, MD  REFERRING PROVIDER:  Vladimir Faster, DO  END OF SESSION:  PT End of Session - 08/06/23 0934     Visit Number 6    Number of Visits 13    Date for PT Re-Evaluation 09/07/23    Authorization Type [employer] Medicare    PT Start Time 0932    PT Stop Time 1014   11 minutes non-billable due to pt in restroom   PT Time Calculation (min) 42 min    Equipment Utilized During Treatment Gait belt    Activity Tolerance Patient tolerated treatment well    Behavior During Therapy [facility] for tasks assessed/performed             Past Medical History:  Diagnosis Date   Adverse effect of antihyperlipidemic and antiarteriosclerotic drugs, initial encounter 10/11/2021   Arrhythmia    Atrial fibrillation 10/11/2021   Cerebrovascular disease    moderate per 2021 MRI   Cholecystitis 09/01/2021   Chronic kidney disease, stage 3 unspecified 10/11/2021   Dizzy spells 03/08/2020   Erectile dysfunction 10/11/2021   Essential hypertension 06/14/2015   Glaucoma 08/31/2021   Hyperlipidemia 06/14/2015   Mild cognitive impairment of uncertain or unknown etiology 05/21/2023   Multiple lacunar infarcts    bilateral centrum semiovale and right parietal cortex.   Obesity 10/11/2021   Obstructive sleep apnea    no CPAP   Primary open angle glaucoma (POAG) of left eye, moderate stage 10/11/2021   Primary open-angle glaucoma, right eye, mild stage 10/11/2021   Renal insufficiency 09/06/2022   Sinus bradycardia 03/08/2020   Tremor 10/11/2021   Type II diabetes mellitus 10/11/2021   Past Surgical History:  Procedure Laterality Date   CHOLECYSTECTOMY N/A 08/31/2021   Procedure: LAPAROSCOPIC CHOLECYSTECTOMY;  Surgeon: Stechschulte, Hyman Hopes, MD;  Location: [facility] OR;  Service: General;  Laterality: N/A;   EYE SURGERY     Glaucoma surgery    PROSTATE SURGERY     Patient Active Problem List   Diagnosis Date Noted   Mild cognitive impairment of uncertain or unknown etiology 05/21/2023   Obstructive sleep apnea    Multiple lacunar infarcts    Cerebrovascular disease    Renal insufficiency 09/06/2022   Atrial fibrillation 10/11/2021   Chronic kidney disease, stage 3 unspecified 10/11/2021   Obesity 10/11/2021   Erectile dysfunction 10/11/2021   Primary open angle glaucoma (POAG) of left eye, moderate stage 10/11/2021   Primary open-angle glaucoma, right eye, mild stage 10/11/2021   Tremor 10/11/2021   Type II diabetes mellitus 10/11/2021   Cholecystitis 09/01/2021   Glaucoma 08/31/2021   Arrhythmia    Dizzy spells 03/08/2020   Sinus bradycardia 03/08/2020   Essential hypertension 06/14/2015   Hyperlipidemia 06/14/2015    ONSET DATE: 06/19/2023  REFERRING DIAG: G20.A1 (ICD-10-CM) - Parkinson's disease without dyskinesia or fluctuating manifestations (HCC)   THERAPY DIAG:  Muscle weakness (generalized)  Unsteadiness on feet  Other abnormalities of gait and mobility  Other lack of coordination  Rationale for Evaluation and Treatment: Rehabilitation  SUBJECTIVE:  SUBJECTIVE STATEMENT: No changes. Reports things are going well at home.   Pt accompanied by:  Spouse, [name]  PERTINENT HISTORY: PMH: PD,  A fb, stage 3 CKD, HLD, HTN, multiple lacunar infarcts, obesity, type II diabetes    Per Dr. Arbutus Leas:  Idiopathic Parkinson's disease, moderate             -Diagnosis was recently made, but likely has had this for a number of years  PAIN:  Are you having pain? No  There were no vitals filed for this visit.   PRECAUTIONS: Fall  RED FLAGS: None   WEIGHT BEARING RESTRICTIONS: No  FALLS: Has patient fallen in last 6  months? No  LIVING ENVIRONMENT: Lives with: lives with their spouse Lives in: House/apartment Stairs: Yes: External: 3 steps; bilateral but cannot reach both Has following equipment at home: None Has trouble getting up from sitting in the bathtub   PLOF: Independent Sometimes needs help with buttoning   PATIENT GOALS: Slow down the progression, maintain as much as possible.   OBJECTIVE:  Note: Objective measures were completed at Evaluation unless otherwise noted.  COGNITION: Overall cognitive status: Within functional limits for tasks assessed and Impaired Neurocognitive testing in February, 2024 with evidence of mild cognitive impairment. This was largely stable compared to prior testing with Dr. Roseanne Reno in 2021.    SENSATION: Light touch: WFL Pt reports sometimes numbness/tingling when getting up in the morning   COORDINATION: Heel to shin: WNL   MUSCLE TONE: Noted more stiffness in LLE    POSTURE: rounded shoulders and forward head  RUE resting tremors   LOWER EXTREMITY ROM:     Noted slightly decr R knee extension AROM   LOWER EXTREMITY MMT:    MMT Right Eval Left Eval  Hip flexion 5 5  Hip extension    Hip abduction    Hip adduction    Hip internal rotation    Hip external rotation    Knee flexion 5 5  Knee extension 5 5  Ankle dorsiflexion 5 5  Ankle plantarflexion    Ankle inversion    Ankle eversion    (Blank rows = not tested)  BED MOBILITY:  Pt reports no difficulty   TRANSFERS: Assistive device utilized: None  Sit to stand: SBA Stand to sit: SBA Performs with no UE support   Pt notes difficulty getting in and out of the car, with bringing the legs in.   Pt reports difficulty getting up off the couch, sometimes will take a couple of tries.    FUNCTIONAL TESTS:  5 times sit to stand: 14 seconds with no UE support  10 meter walk test: 10.91 seconds = 3.01 ft/sec    TODAY'S TREATMENT:                                                                                                                                Therapeutic Activity:  Provided handout and went  over local PD community resources and went over options for continued community fitness  5x sit <> stand: 11.3 seconds, no UE support Posterior push and release test: 4-5 small steps with min A from therapist for balance  NMR 345' x 1 gait around gym with focus on tall posture, arm swing, step length, and opening up R hand during gait  Forward/retro stepping and weight shifting, beginning with stepping only x5 reps each side, adding in coordinated arm movement (arms in PWR Up when stepping forwards, with backwards steps bringing arms forward and extending), performed x12 reps each side, cues for opening up hands and elbow extension with UE movement  On rockerboard in A/P direction: alternating step ups with stepping backwards to floor, x12 reps, trying to perform with marching on rockerboard for dynamic SLS, but pt needing to tap foot down for balance    PATIENT EDUCATION: Education details: Continue HEP, community resources for PD, added forward/retro stepping to HEP to work on posterior reactive balance  Person educated: Patient and Spouse Education method: Programmer, multimedia, Facilities manager, Verbal cues, and Handouts Education comprehension: verbalized understanding, returned demonstration, and needs further education  HOME EXERCISE PROGRAM: Standing PWR moves   Access Code: [email or web address] URL: [email or web address] Date: 07/30/2023 Prepared by: Sherlie Ban  Exercises - Staggered Stance Weight Shift with Arms Reaching  - 1-2 x daily - 5 x weekly - 2 sets - 10 reps  GOALS: Goals reviewed with patient? Yes  SHORT TERM GOALS:  Target date: 08/06/2023  Pt will be independent with initial HEP for PD specific deficits in order to build upon functional gains made in therapy. Baseline: Goal status: MET  2.  Pt will perform posterior stepping strategy in 3  steps or less in order to demo improved balance strategies.  Baseline: at least 5 steps, min A for balance   4-5 small steps with min A from therapist for balance (10/30) Goal status: NOT MET  3.  Pt will improve 5x sit<>stand to less than or equal to 12 sec to demonstrate improved functional strength and transfer efficiency.   Baseline: 14 seconds with no UE support  11.3 seconds (10/30) Goal status: MET   LONG TERM GOALS: Target date: 09/03/2023   Pt will be independent with final  HEP for PD specific deficits in order to build upon functional gains made in therapy. Baseline:  Goal status: INITIAL  2.  Pt will improve miniBEST to at least a 22/28 in order to demo decr fall risk Baseline: 17/28 Goal status: INITIAL  3.  Pt will verbalize understanding of local Parkinson's disease resources, including options for continue community fitness.  Baseline:  Goal status: INITIAL  4.  Pt will improve TUG and TUG cog score to less than or equal to 10% difference for improved dual task/decreased fall risk.  Baseline:  Goal status: INITIAL  5.  Pt will get up from lower/softer surfaces to mimic a couch/recliner mod I in order to demo improved transfers  Baseline:  Goal status: INITIAL    ASSESSMENT:  CLINICAL IMPRESSION:  Session limited today due to pt having to use the restroom. Provided community resources and went over with pt/pt's spouse for continued PD fitness after D/C. Pt met STGs in regards to performing HEP and 5x sit <> stand. Pt improved sit <> stand from 14 > 11.3 seconds. Pt did not meet STG in regards to push and release. Performed with 4-5 small steps and continuing to need min A from therapist for balance. Added forward/retro stepping  to pt's HEP to work on this. Will continue per POC.   OBJECTIVE IMPAIRMENTS: Abnormal gait, decreased activity tolerance, decreased balance, decreased cognition, decreased coordination, decreased mobility, difficulty walking,  decreased strength, impaired flexibility, impaired UE functional use, and postural dysfunction.   ACTIVITY LIMITATIONS: carrying, bending, stairs, transfers, dressing, and locomotion level  PARTICIPATION LIMITATIONS: community activity  PERSONAL FACTORS: Age, Behavior pattern, Past/current experiences, Time since onset of injury/illness/exacerbation, and 3+ comorbidities:  PD,  A fb, stage 3 CKD, HLD, HTN, multiple lacunar infarcts, obesity, type II diabetes   are also affecting patient's functional outcome.   REHAB POTENTIAL: Good  CLINICAL DECISION MAKING: Evolving/moderate complexity  EVALUATION COMPLEXITY: Moderate  PLAN:  PT FREQUENCY: 2x/week  PT DURATION: 8 weeks  PLANNED INTERVENTIONS: Therapeutic exercises, Therapeutic activity, Neuromuscular re-education, Balance training, Gait training, Patient/Family education, Self Care, Joint mobilization, Stair training, DME instructions, and Re-evaluation  PLAN FOR NEXT SESSION: work on SLS, stepping strategies, larger amplitude movement patterns . SciFit for reciprocal movement, endurance. Gait exercises with arm swing, resisted gait with pertubations  Posterior balance    Drake Leach, PT, DPT 08/06/2023, 10:16 AM

## 2023-08-06 NOTE — Therapy (Signed)
OUTPATIENT SPEECH LANGUAGE PATHOLOGY PARKINSON'S TREATMENT   Patient Name: Joseph Lamb MRN: [id number] DOB:Apr 12, 1951, 72 y.o., male Today's Date: 08/06/2023  PCP: Lupita Raider MD REFERRING PROVIDER: Vladimir Faster, DO  END OF SESSION:  End of Session - 08/06/23 1024     Visit Number 6    Number of Visits 17    Date for SLP Re-Evaluation 09/03/23    SLP Start Time 1015    SLP Stop Time  1100    SLP Time Calculation (min) 45 min    Activity Tolerance Patient tolerated treatment well              Past Medical History:  Diagnosis Date   Adverse effect of antihyperlipidemic and antiarteriosclerotic drugs, initial encounter 10/11/2021   Arrhythmia    Atrial fibrillation 10/11/2021   Cerebrovascular disease    moderate per 2021 MRI   Cholecystitis 09/01/2021   Chronic kidney disease, stage 3 unspecified 10/11/2021   Dizzy spells 03/08/2020   Erectile dysfunction 10/11/2021   Essential hypertension 06/14/2015   Glaucoma 08/31/2021   Hyperlipidemia 06/14/2015   Mild cognitive impairment of uncertain or unknown etiology 05/21/2023   Multiple lacunar infarcts    bilateral centrum semiovale and right parietal cortex.   Obesity 10/11/2021   Obstructive sleep apnea    no CPAP   Primary open angle glaucoma (POAG) of left eye, moderate stage 10/11/2021   Primary open-angle glaucoma, right eye, mild stage 10/11/2021   Renal insufficiency 09/06/2022   Sinus bradycardia 03/08/2020   Tremor 10/11/2021   Type II diabetes mellitus 10/11/2021   Past Surgical History:  Procedure Laterality Date   CHOLECYSTECTOMY N/A 08/31/2021   Procedure: LAPAROSCOPIC CHOLECYSTECTOMY;  Surgeon: Stechschulte, Hyman Hopes, MD;  Location: [facility] OR;  Service: General;  Laterality: N/A;   EYE SURGERY     Glaucoma surgery   PROSTATE SURGERY     Patient Active Problem List   Diagnosis Date Noted   Mild cognitive impairment of uncertain or unknown etiology 05/21/2023   Obstructive sleep apnea     Multiple lacunar infarcts    Cerebrovascular disease    Renal insufficiency 09/06/2022   Atrial fibrillation 10/11/2021   Chronic kidney disease, stage 3 unspecified 10/11/2021   Obesity 10/11/2021   Erectile dysfunction 10/11/2021   Primary open angle glaucoma (POAG) of left eye, moderate stage 10/11/2021   Primary open-angle glaucoma, right eye, mild stage 10/11/2021   Tremor 10/11/2021   Type II diabetes mellitus 10/11/2021   Cholecystitis 09/01/2021   Glaucoma 08/31/2021   Arrhythmia    Dizzy spells 03/08/2020   Sinus bradycardia 03/08/2020   Essential hypertension 06/14/2015   Hyperlipidemia 06/14/2015    ONSET DATE: 06/19/2023(referral date)  REFERRING DIAG:  Diagnosis  G20.A1 (ICD-10-CM) - Parkinson's disease without dyskinesia or fluctuating manifestations (HCC)    THERAPY DIAG:  Dysarthria and anarthria  Rationale for Evaluation and Treatment: Rehabilitation  SUBJECTIVE:   SUBJECTIVE STATEMENT: 'I have been doing them twice a day. I'm on Lesson 9" Pt accompanied by: significant other  PERTINENT HISTORY: From Dr. Don Perking consult: [name] was seen in the movement disorders clinic for neurologic consultation at the request of Rosann Auerbach, PhD.  The consultation is for the evaluation of tremor and Parkinson's.  Pt with wife who supplements hx.  Patient has been seen in our clinic for a long time.  He was first seen by Dr. Karel Jarvis in 2016 and Dr. Marjory Lies prior to that.  Initial evaluation for his further evaluation of memory change,  but at that point in time his MoCA was 29/30.  He came back for further evaluation in 2021.  In the June, 2021 evaluation, it is noted that patient had begun to have hand tremors for 5 months prior.  At that point in time, Dr. Karel Jarvis did not see much on the examination, however.  By August, 2022, Dr. Karel Jarvis did note occasional right thumb resting tremor and mild cogwheeling in the office.  The following visits were with Marlowe Kays.  With time, she began to note right greater than left tremors at rest, mouth tremor, sialorrhea and increased tone.  He was started on levodopa by her in February, 2024.  He apparently discontinued it due to the fact that he felt his heart was fluttering and he d/c the medication.  .he thinks that he took it for a months.  He states that mostly he noted no difference in tremor so he d/c it.  he saw Dr. Milbert Coulter for memory testing in August, 2024, demonstrating mild cognitive impairment.  PAIN:  Are you having pain? No  FALLS: Has patient fallen in last 6 months?  See PT evaluation for details  LIVING ENVIRONMENT: Lives with: lives with their spouse and lives with their son Lives in: House/apartment  PLOF:  Level of assistance: Independent with ADLs, Independent with IADLs Employment: Retired  PATIENT GOALS: "To talk better"  OBJECTIVE:   TODAY'S TREATMENT:                                                                                                                                         DATE:   08/06/23: Joseph Lamb did not complete SpeakOut! Yesterday. He said he took the day off. I generated a log to track his twice daily and the lesson he has just completed. He completed Speak Out! Lesson 9 hitting all dB targets with supervision cues. In conversation over 15 minutes with prompts, then spontaneous, Kol averaged 70dB with rare min A.   0/28/24: Joseph Lamb reports consistently completing each lesson in Speak Out! Workbook twice a day. He enters knowing that he is on Lesson 9. Today we used an Theatre stage manager from Speak Out! eLibrary (November week 1) Joseph Lamb required occasional min verbal and visual cues to hold Ah for 10 seconds. He hit all dB targets in the lesson with rare min A (occasional min A for conversation exercises - divergent naming) In simple conversation Joseph Lamb required occasional min verbal and gesture cues to maintain 72dB - volume decreases and cognitive load increases - Joseph Lamb  verbalizes awareness of slow processing. Education provided for ways family members can assist him in participating in conversations with small groups of family. He told his other son about his PD - they endorsed that he will watch the "what is Parkinson's" webinar on speak out! Website.   10/23/24Fayrene Lamb reports completing Speak Out! 1x - continue to require him to complete  this twice a day. He self correct intent voice 3x with supervision cues in initial conversation.  Targeted volume and intelligibility using Speak Out! Lesson 6. Pt required rare min  verbal cues, modeling, for volume and breath support. Pt averages the following volume levels:  Sustained AH: 93 dB  Counting:87  Reading (phrases): 87dB  Cognitive Exercise: 75dB Required usual mod verbal cues, modeling for carryover of intent /volume completing and explaining idioms   07/28/23: Joseph Lamb reports completing Speak Out! Through lesson 5, however he did not complete each lesson twice. I reviewed requirement to complete each lesson 2x, twice a day. We completed Lesson 5 today.  Targeted volume and intelligibility using Speak Out! Lesson 5. Pt required occasional min  verbal cues, modeling, for volume and breath support.  Pt averages the following volume levels:  Sustained AH: 90dB  Counting:84dB  Reading (phrases): 80dB  Cognitive Exercise: 78dB Required usual min to mod verbal cues, modeling for carryover of intent /volume on divergent naming task. Usual min semantic cues to generate 5-8 items in simple categories. In simple description re: holiday traditions, Joseph Lamb averaged 72dB with usual mod modeling, verbal and gesture cues.    07/23/23: Joseph Lamb brings Speak Out! Workbook to the session. He and Joseph Lamb have watched the Learn About Parkinson's webinar on [employer]. Instructed them to have their son watch it as well. Joseph Lamb endorses "I have been speaking low for a long time, I didn't realize it"  Previewed the  workbook with them, introduced requirement of twice daily practice, advancing lessons after 2 sessions. Demonstrated online Speak Out! Sessions. Targeted volume and intelligibility using Speak Out! Lesson 1. Pt required usual to  occasional min verbal cues and modeling, for volume and breath support.  Pt averages the following volume levels  Sustained AH: 85dB  Counting:85dB  Reading (phrases): 75dB  Cognitive Exercise: 72dB Required usual mod verbal cues, modeling for carryover of intent /volume generating 3 sentence descriptions of basic objects.  following structured practice. In simple conversation over 5 minutes, Joseph Lamb averaged 70dB with usual mod visual and verbal cues.    07/09/23 (eval day): Introduced [employer]! Workbook to [employer]. Ordered work book - Introduced them to [employer], demonstrated location of Learn about [employer] and on Physiological scientist. Instructed them to watch webinar and an online practice session and have their 2 sons watch the webinar as well. Educated re: Automatic vs Intentional motor systems. Joseph Lamb Id'd when he was speaking or reading with intent vs not using intent. By the end of the session, he was conversing with intent to average 70dB with occasional min a  PATIENT EDUCATION: Education details: See Today's Treatment; See patient instructions; HEP for dysarthria; PD education Person educated: Patient and Spouse Education method: Explanation, Demonstration, Verbal cues, and Handouts Education comprehension: verbalized understanding, returned demonstration, verbal cues required, and needs further education  HOME EXERCISE PROGRAM: Speak Out! Workbook ordered - they are to bring it to ST when it arrives   GOALS: Goals reviewed with patient? Yes  SHORT TERM GOALS: Target date: 08/06/23  Pt will complete HEP hitting target dB's with occasional min A over 1 week Baseline: Goal status: MET  2.  Pt will complete HEP twice a  day, completing at least 1 on line session with Parkinson [employer] website Baseline:  Goal status: MET  3.  Pt will averge 72dB 18/20 sentences with rare min A Baseline:  Goal status: MET  4.  Pt will average  72dB over 12 minute converation with rare min A Baseline:  Goal status: ONGOING  5.  Pt and his family will watch Learn about Parkinson's webinar on [employer] website Baseline:  Goal status: MET              LONG TERM GOALS: Target date: 09/03/23  Pt will complete HEP for dysarthria twice daily accurately over 1 week with mod I Baseline:  Goal status:MET   2.  Pt will complete 2 on line practice sessions from [employer] online Baseline:  Goal status: ONGOING  3.  Pt will maintain 70dB over 15 minute conversation with mod I Baseline:  Goal status: ONGOING  4.  Pt will improve score on Communicative Effectiveness Survey by 3 points Baseline: 19 - spouse rated due to reduced pt awareness Goal status: ONGOING  5.  Pt/spouse will verbalize requirement to continue Speak Out! Daily practice with workbook and online up d/c of ST Baseline:  Goal status: ONGOING   ASSESSMENT:  CLINICAL IMPRESSION: Patient is a 72 y.o. 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 He is accompanied by his spouse, Joseph Lamb. Dareion verbalizes reduced awareness of low volume, stating his spouse needs a hearing aid. He does endorse he is "a little low" at times. Joseph Lamb reports frequent requests for repetition and difficulty hearing Joseph Lamb. They deny difficulty swallowing, coughing or getting choked or strangled. Meds are swallowed without difficulty. They report some memory issues, such as walking into a room and forgetting why, however they both feel that Joseph Lamb' current cognitive function is acceptable for day to day activities. Yusha' volume improved significantly after initial training for speak with intent and he demonstrated carry over during  simple short conversation at the end of the evaluation. I recommend skilled ST to maximize intelligibility for independence, QOL, reduce caregiver burden.   OBJECTIVE IMPAIRMENTS: Objective impairments include memory and dysarthria. These impairments are limiting patient from effectively communicating at home and in community.Factors affecting potential to achieve goals and functional outcome are medical prognosis.. Patient will benefit from skilled SLP services to address above impairments and improve overall function.  REHAB POTENTIAL: Good  PLAN:  SLP FREQUENCY: 2x/week  SLP DURATION: 8 weeks  PLANNED INTERVENTIONS: Aspiration precaution training, Diet toleration management , Environmental controls, Cueing hierachy, Cognitive reorganization, Internal/external aids, Functional tasks, Multimodal communication approach, and SLP instruction and feedback MBSS    Adaley Kiene, Radene Journey, CCC-SLP 08/06/2023, 11:03 AM

## 2023-08-06 NOTE — Patient Instructions (Signed)
Suggestions for Handwriting Changes Many people with Parkinson's notice changes in their handwriting.  Handwriting often becomes small and cramped, and can become more difficult to control when writing for longer periods of time.  This handwriting change is called micrographia.  Why does micrographia occur?  Parkinson's can cause slowing of movement, and feelings of muscle stiffness in the hands and fingers.  Loss of automatic motion also affects the easy, flowing motion of handwriting.  This can impact even simple writing tasks such as signing your name or writing a shopping list.  Attempts to write quickly without thinking about forming each letter contributes to small, cramped handwriting, and may cause the hand to develop a feeling of tightness.  How can I make writing easier?  Make a deliberate effort to form each letter.  This can be hard to do at first, but is very effective in improving size and legibility of handwriting.  Use a pen grip (round or triangular shaped rubber or foam cylinders available at stationary stores or where writing materials are found) or a larger size pen to keep your hand more relaxed.  Try printing rather than writing in a cursive style. Printing causes you to pause briefly between each letter, keeping writing more legible.   Using lined paper may provide a "visual target" to keep all letters big when writing.   A ballpoint pen typically works better than felt tip or "rolling writer"/gel styles.  Rest your hand if it begins to feel "tight".  Pause briefly when you see your handwriting becoming smaller.  Avoid hurrying or trying to write long passages if you are feeling stressed or fatigued.  Practice helps!  Remind yourself to slow down, aim big, and pause often!  Perform "flicks"/PWR! Hands if your hand feels tight, your writing gets smaller, before you start writing, or if tremors increase.  Involving your team: An occupational therapist can provide  assessment and individual recommendations for improvement of your handwriting.  This handout was adapted from [email or web address] [employer]

## 2023-08-11 ENCOUNTER — Ambulatory Visit: Payer: Medicare Other | Admitting: Physical Therapy

## 2023-08-11 ENCOUNTER — Ambulatory Visit: Payer: Medicare Other | Attending: Family Medicine | Admitting: Speech Pathology

## 2023-08-11 ENCOUNTER — Ambulatory Visit: Payer: Medicare Other | Admitting: Occupational Therapy

## 2023-08-11 ENCOUNTER — Encounter: Payer: Self-pay | Admitting: Physical Therapy

## 2023-08-11 ENCOUNTER — Encounter: Payer: Self-pay | Admitting: Speech Pathology

## 2023-08-11 VITALS — BP 109/76 | HR 78

## 2023-08-11 DIAGNOSIS — R29818 Other symptoms and signs involving the nervous system: Secondary | ICD-10-CM | POA: Diagnosis present

## 2023-08-11 DIAGNOSIS — R251 Tremor, unspecified: Secondary | ICD-10-CM

## 2023-08-11 DIAGNOSIS — R29898 Other symptoms and signs involving the musculoskeletal system: Secondary | ICD-10-CM

## 2023-08-11 DIAGNOSIS — M6281 Muscle weakness (generalized): Secondary | ICD-10-CM | POA: Diagnosis present

## 2023-08-11 DIAGNOSIS — R2681 Unsteadiness on feet: Secondary | ICD-10-CM | POA: Insufficient documentation

## 2023-08-11 DIAGNOSIS — R2689 Other abnormalities of gait and mobility: Secondary | ICD-10-CM | POA: Insufficient documentation

## 2023-08-11 DIAGNOSIS — R471 Dysarthria and anarthria: Secondary | ICD-10-CM | POA: Insufficient documentation

## 2023-08-11 DIAGNOSIS — R278 Other lack of coordination: Secondary | ICD-10-CM

## 2023-08-11 DIAGNOSIS — R279 Unspecified lack of coordination: Secondary | ICD-10-CM

## 2023-08-11 NOTE — Patient Instructions (Addendum)
To DON jacket:   1) Wide stance 2) Hold facing tag out with hands big at sleeves 3) Swing around big like a cape to Lt side 4) Punch Lt arm in as you continue to pull behind you with Rt hand to Rt 5) Punch Rt arm in   To DOFF jacket:   1) Wide stance 2) Hold big hands at belly button level 3 opt1) Throw arms behind you 3 opt 1) Slide jacket down arms 3 opt2) Turn and twist to Lt to take off Lt shoulder 3 opt) Turn and twist to Rt to take off Rt shoulder 4) Reach big behind you and grab cuff of sleeve BIG and yank off one hand, then the other

## 2023-08-11 NOTE — Therapy (Signed)
OUTPATIENT OCCUPATIONAL THERAPY PARKINSON'S TREATMENT  Patient Name: Joseph Lamb MRN: [id number] DOB:Jun 12, 1951, 72 y.o., male Today's Date: 08/11/2023  PCP: Lupita Raider, MD REFERRING PROVIDER: Vladimir Faster, DO  END OF SESSION:  OT End of Session - 08/11/23 1105     Visit Number 4    Number of Visits 20    Date for OT Re-Evaluation 10/03/23    Authorization Type [employer] Medicare 2024 10% coinsur NO Ded VL: [place] Auth Not Reqd    Progress Note Due on Visit 10    OT Start Time 1105    OT Stop Time 1146    OT Time Calculation (min) 41 min    Activity Tolerance Patient tolerated treatment well    Behavior During Therapy [facility] for tasks assessed/performed             Past Medical History:  Diagnosis Date   Adverse effect of antihyperlipidemic and antiarteriosclerotic drugs, initial encounter 10/11/2021   Arrhythmia    Atrial fibrillation 10/11/2021   Cerebrovascular disease    moderate per 2021 MRI   Cholecystitis 09/01/2021   Chronic kidney disease, stage 3 unspecified 10/11/2021   Dizzy spells 03/08/2020   Erectile dysfunction 10/11/2021   Essential hypertension 06/14/2015   Glaucoma 08/31/2021   Hyperlipidemia 06/14/2015   Mild cognitive impairment of uncertain or unknown etiology 05/21/2023   Multiple lacunar infarcts    bilateral centrum semiovale and right parietal cortex.   Obesity 10/11/2021   Obstructive sleep apnea    no CPAP   Primary open angle glaucoma (POAG) of left eye, moderate stage 10/11/2021   Primary open-angle glaucoma, right eye, mild stage 10/11/2021   Renal insufficiency 09/06/2022   Sinus bradycardia 03/08/2020   Tremor 10/11/2021   Type II diabetes mellitus 10/11/2021   Past Surgical History:  Procedure Laterality Date   CHOLECYSTECTOMY N/A 08/31/2021   Procedure: LAPAROSCOPIC CHOLECYSTECTOMY;  Surgeon: Stechschulte, Hyman Hopes, MD;  Location: [facility] OR;  Service: General;  Laterality: N/A;   EYE SURGERY     Glaucoma surgery   PROSTATE  SURGERY     Patient Active Problem List   Diagnosis Date Noted   Mild cognitive impairment of uncertain or unknown etiology 05/21/2023   Obstructive sleep apnea    Multiple lacunar infarcts    Cerebrovascular disease    Renal insufficiency 09/06/2022   Atrial fibrillation 10/11/2021   Chronic kidney disease, stage 3 unspecified 10/11/2021   Obesity 10/11/2021   Erectile dysfunction 10/11/2021   Primary open angle glaucoma (POAG) of left eye, moderate stage 10/11/2021   Primary open-angle glaucoma, right eye, mild stage 10/11/2021   Tremor 10/11/2021   Type II diabetes mellitus 10/11/2021   Cholecystitis 09/01/2021   Glaucoma 08/31/2021   Arrhythmia    Dizzy spells 03/08/2020   Sinus bradycardia 03/08/2020   Essential hypertension 06/14/2015   Hyperlipidemia 06/14/2015    ONSET DATE: 06/19/2023  REFERRING DIAG: G20.A1 (ICD-10-CM) - Parkinson's disease without dyskinesia or fluctuating manifestations  THERAPY DIAG:  Muscle weakness (generalized)  Other symptoms and signs involving the nervous system  Other lack of coordination  Other symptoms and signs involving the musculoskeletal system  Tremors of nervous system  Unspecified lack of coordination  Rationale for Evaluation and Treatment: Rehabilitation  SUBJECTIVE:   SUBJECTIVE STATEMENT: Pt reports help from use of PWR! Hand exercises.   Pt accompanied by: self   PERTINENT HISTORY:  PMH: PD (newly diagnosed), A fb, stage 3 CKD, HLD, HTN, multiple lacunar infarcts, obesity, type II diabetes  Re: Dr.  Tat Neuro appt: 06/19/2023 Consultation is for the evaluation of tremor and Parkinson's.  He was first seen by Dr. Karel Jarvis in 2016 and Dr. Marjory Lies prior to that.  Initial evaluation for his further evaluation of memory change, but at that point in time his MoCA was 29/30.  He came back for further evaluation in 2021.  In the June, 2021, it was noted that patient had began to have hand tremors for 5 months prior.  At  that point in time, Dr. Karel Jarvis did not see much on the examination, however.  By August, 2022, Dr. Karel Jarvis did note occasional right thumb resting tremor and mild cogwheeling in the office.  The following visits were with Marlowe Kays.  With time, she began to note right greater than left tremors at rest, mouth tremor, sialorrhea and increased tone.  He was started on levodopa by her in February, 2024.  He apparently discontinued it due to the fact that he felt his heart was fluttering and he d/c the medication. He states that mostly he noted no difference in tremor so he d/c it.  He saw Dr. Milbert Coulter for memory testing in August, 2024, demonstrating mild cognitive impairment.  Dr. Milbert Coulter referred him to Dr. Arbutus Leas for further evaluation.   PRECAUTIONS: None  WEIGHT BEARING RESTRICTIONS: No  PAIN:  Are you having pain? Yes: NPRS scale: 4-5/10 Pain location: R hand and shoulder & abdominal Pain description: gnawing pain Aggravating factors: When I get upset/tired, eat certain foods - hot/spicy ie) hamburgers, BBQ Relieving factors: Stop the shaking in R hand/arm, Tums  FALLS: Has patient fallen in last 6 months? No  LIVING ENVIRONMENT: Lives with: lives with their spouse and lives with their son Lives in: House/apartment Stairs: Yes: External: 3 steps; bilateral but cannot reach both Has following equipment at home: shower chair and Grab bars  PLOF: Independent Sometimes needs help with buttoning & tying laces  PATIENT GOALS: To help with stiffness and dexterity in his hands/arms ie) has/had trouble turning to reach to back seat of the car, trouble with some dressing/buttons and other ADLs compared to PLOF.  OBJECTIVE:  Note: Objective measures were completed at Evaluation unless otherwise noted.  HAND DOMINANCE: Right  ADLs: Overall ADLs: Mod I Transfers/ambulation related to ADLs: Ind without AE at this time Eating: Does it himself, including cutting food. Grooming: "I do an adequate job."  Re: shaving (only missing a little bit) UB Dressing: Wife may help to pull down his shirt in the back LB Dressing: Takes extra time to tie laces Toileting: Mod I Bathing: Decent job. 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 and they were bubble packed but thinks he'll have to  Financial management: Wife is the Management consultant" Handwriting: 50% legible and Mild micrographia - see eval for sample writing  MOBILITY STATUS: Independent  POSTURE COMMENTS:  rounded shoulders and forward head  ACTIVITY TOLERANCE: Activity tolerance: Good  FUNCTIONAL OUTCOME MEASURES: Fastening/unfastening 3 buttons: 42.68 sec Physical performance test: PPT#2 (simulated eating) 15.1 sec & PPT#4 (donning/doffing jacket): 16.09 sec  UEFI = 62/80 (see specific areas in eval)  COORDINATION:  9 Hole Peg test: Right: 36.28  sec; Left: 36.4 sec Box and Blocks:  Right 47 blocks, Left 43 blocks Tremors: Resting and Right  UE ROM:  WFL - slight end range limitations  UE MMT:   WFL Grip Strength: Right 57.5, 71.4, 75.6 Average 68.2 lbs Left 55.9, 70.5, 83.3 Average 69.9 lbs  SENSATION: WFL  MUSCLE TONE: TBA  COGNITION: Overall cognitive status: Within functional limits for tasks assessed - See ST evaluation  OBSERVATIONS: Dystonia  TODAY'S TREATMENT:                                                                                                                                Reviewed PWR! Hands as noted in pt instructions as needed to promote ROM as needed for UE tasks. Mod cues to open other fingers big with  PWR! Step, for full wrist ext with PWR! Rock and push, and full supination for [employer]! Twist  OT educated pt on cape method for donning and doffing jacket. Pt unable to consistently complete as needed to meet goal though demonstrated significant improvement with practice despite fatiguing.   Issued PWR! Sitting moves to rock exercise as pt requiring extensive cueing for proper completion as needed for desired movements.    PATIENT EDUCATION: Education details: Review of PWR! Hands; jacket; PWR! Sitting Person educated: Patient Education method: Explanation, Demonstration, and Handouts Education comprehension: verbalized understanding, returned demonstration, and needs further education  HOME EXERCISE PROGRAM: 07/30/2023: PWR! Hands; BIG ADLs 08/06/23: handwriting strategies 08/11/2023: PWR! Sitting; Cape method  GOALS: Goals reviewed with patient? Yes  GOALS:  SHORT TERM GOALS: Target date: 08/27/2023    Pt will be independent with PD specific HEP.  Baseline: not yet initiated Goal status: IN PROGRESS  2.  Pt will verbalize understanding of adapted strategies to maximize safety and independence with ADLs/IADLs.  Baseline: not yet initiated Goal status: INITIAL  3.  Pt will write his name/simple list with no significant decrease in size and maintain 75% legibility.  Baseline: 50% legibility Goal status: INITIAL  4.  Pt will demonstrate improved fine motor coordination for ADLs as evidenced by decreasing 9 hole peg test score for BUE and by 3-5 secs  Baseline: Right: 36.28  sec; Left: 36.4 sec Goal status: INITIAL  5.  Pt will be able to place at least 3-5 additional blocks using B hand with completion of Box and Blocks test.  Baseline: Right 47 blocks, Left 43 blocks Goal status: INITIAL   LONG TERM GOALS: Target date: 10/10/2023    Pt will verbalize understanding of ways to prevent future PD related complications and PD community resources.  Baseline: not yet  initiated Goal status: INITIAL  2.   Pt will verbalize understanding of ways to keep thinking skills sharp and ways to compensate for STM changes in the future.  Baseline: not yet initiated Goal status: INITIAL  3.  Pt will demonstrate improved ease with feeding as evidenced  by decreasing PPT#2 by 2-4 secs.  Baseline: 15.1 sec Goal status: INITIAL  4.  Pt will demonstrate increased ease with dressing as evidenced by decreasing PPT#4 (don/ doff jacket) by 2-4 secs or more.  Baseline: 16.09 sec 08/11/2023: 17 seconds with personal jacket Goal status: INITIAL  5. Pt will demonstrate improved ease with fastening buttons as evidenced by decreasing 3 button/unbutton time by 2-5 seconds  Baseline: 42.68 sec seconds Goal status: INITIAL  ASSESSMENT:  CLINICAL IMPRESSION: Pt demonstrates good understanding of PD specific strategies as needed to progress towards goals. Pt responds well to large amplitude cueing with review/repetition. He will require further practice for proper carryover.   PERFORMANCE DEFICITS: in functional skills including ADLs, IADLs, coordination, dexterity, tone, ROM, strength, pain, flexibility, Fine motor control, Gross motor control, balance, endurance, decreased knowledge of precautions, decreased knowledge of use of DME, and UE functional use, cognitive skills including safety awareness, and psychosocial skills including coping strategies and environmental adaptation.   IMPAIRMENTS: are limiting patient from ADLs, IADLs, and leisure.   COMORBIDITIES:  may have co-morbidities  that affects occupational performance. Patient will benefit from skilled OT to address above impairments and improve overall function.  REHAB POTENTIAL: Excellent  PLAN:  OT FREQUENCY: 2x/week  OT DURATION: 10 weeks  PLANNED INTERVENTIONS: 97535 self care/ADL training, 19147 therapeutic exercise, 97530 therapeutic activity, 97112 neuromuscular re-education, functional mobility  training, psychosocial skills training, energy conservation, coping strategies training, patient/family education, and DME and/or AE instructions  RECOMMENDED OTHER SERVICES: Pt received PT and ST evaluations already and appts in place for all 3 services  CONSULTED AND AGREED WITH PLAN OF CARE: Patient and family member/caregiver  PLAN FOR NEXT SESSION:   Review cape method for jacket PWR! Seated (review to rock and initiate remaining)   Delana Meyer, OT 08/11/2023, 1:57 PM

## 2023-08-11 NOTE — Therapy (Signed)
OUTPATIENT SPEECH LANGUAGE PATHOLOGY PARKINSON'S TREATMENT   Patient Name: Joseph Lamb MRN: [id number] DOB:1951-01-30, 72 y.o., male Today's Date: 08/11/2023  PCP: Lupita Raider MD REFERRING PROVIDER: Vladimir Faster, DO  END OF SESSION:  End of Session - 08/11/23 0938     Visit Number 7    Number of Visits 17    Date for SLP Re-Evaluation 09/03/23    SLP Start Time 0935    SLP Stop Time  1015    SLP Time Calculation (min) 40 min    Activity Tolerance Patient tolerated treatment well              Past Medical History:  Diagnosis Date   Adverse effect of antihyperlipidemic and antiarteriosclerotic drugs, initial encounter 10/11/2021   Arrhythmia    Atrial fibrillation 10/11/2021   Cerebrovascular disease    moderate per 2021 MRI   Cholecystitis 09/01/2021   Chronic kidney disease, stage 3 unspecified 10/11/2021   Dizzy spells 03/08/2020   Erectile dysfunction 10/11/2021   Essential hypertension 06/14/2015   Glaucoma 08/31/2021   Hyperlipidemia 06/14/2015   Mild cognitive impairment of uncertain or unknown etiology 05/21/2023   Multiple lacunar infarcts    bilateral centrum semiovale and right parietal cortex.   Obesity 10/11/2021   Obstructive sleep apnea    no CPAP   Primary open angle glaucoma (POAG) of left eye, moderate stage 10/11/2021   Primary open-angle glaucoma, right eye, mild stage 10/11/2021   Renal insufficiency 09/06/2022   Sinus bradycardia 03/08/2020   Tremor 10/11/2021   Type II diabetes mellitus 10/11/2021   Past Surgical History:  Procedure Laterality Date   CHOLECYSTECTOMY N/A 08/31/2021   Procedure: LAPAROSCOPIC CHOLECYSTECTOMY;  Surgeon: Stechschulte, Hyman Hopes, MD;  Location: [facility] OR;  Service: General;  Laterality: N/A;   EYE SURGERY     Glaucoma surgery   PROSTATE SURGERY     Patient Active Problem List   Diagnosis Date Noted   Mild cognitive impairment of uncertain or unknown etiology 05/21/2023   Obstructive sleep apnea     Multiple lacunar infarcts    Cerebrovascular disease    Renal insufficiency 09/06/2022   Atrial fibrillation 10/11/2021   Chronic kidney disease, stage 3 unspecified 10/11/2021   Obesity 10/11/2021   Erectile dysfunction 10/11/2021   Primary open angle glaucoma (POAG) of left eye, moderate stage 10/11/2021   Primary open-angle glaucoma, right eye, mild stage 10/11/2021   Tremor 10/11/2021   Type II diabetes mellitus 10/11/2021   Cholecystitis 09/01/2021   Glaucoma 08/31/2021   Arrhythmia    Dizzy spells 03/08/2020   Sinus bradycardia 03/08/2020   Essential hypertension 06/14/2015   Hyperlipidemia 06/14/2015    ONSET DATE: 06/19/2023(referral date)  REFERRING DIAG:  Diagnosis  G20.A1 (ICD-10-CM) - Parkinson's disease without dyskinesia or fluctuating manifestations (HCC)    THERAPY DIAG:  Dysarthria and anarthria  Rationale for Evaluation and Treatment: Rehabilitation  SUBJECTIVE:   SUBJECTIVE STATEMENT: 'I have been doing them twice a day. I'm on Lesson 9" Pt accompanied by: significant other  PERTINENT HISTORY: From Dr. Don Perking consult: [name] was seen in the movement disorders clinic for neurologic consultation at the request of Rosann Auerbach, PhD.  The consultation is for the evaluation of tremor and Parkinson's.  Pt with wife who supplements hx.  Patient has been seen in our clinic for a long time.  He was first seen by Dr. Karel Jarvis in 2016 and Dr. Marjory Lies prior to that.  Initial evaluation for his further evaluation of memory change,  but at that point in time his MoCA was 29/30.  He came back for further evaluation in 2021.  In the June, 2021 evaluation, it is noted that patient had begun to have hand tremors for 5 months prior.  At that point in time, Dr. Karel Jarvis did not see much on the examination, however.  By August, 2022, Dr. Karel Jarvis did note occasional right thumb resting tremor and mild cogwheeling in the office.  The following visits were with Marlowe Kays.   With time, she began to note right greater than left tremors at rest, mouth tremor, sialorrhea and increased tone.  He was started on levodopa by her in February, 2024.  He apparently discontinued it due to the fact that he felt his heart was fluttering and he d/c the medication.  .he thinks that he took it for a months.  He states that mostly he noted no difference in tremor so he d/c it.  he saw Dr. Milbert Coulter for memory testing in August, 2024, demonstrating mild cognitive impairment.  PAIN:  Are you having pain? No  FALLS: Has patient fallen in last 6 months?  See PT evaluation for details  LIVING ENVIRONMENT: Lives with: lives with their spouse and lives with their son Lives in: House/apartment  PLOF:  Level of assistance: Independent with ADLs, Independent with IADLs Employment: Retired  PATIENT GOALS: "To talk better"  OBJECTIVE:   TODAY'S TREATMENT:                                                                                                                                         DATE:   08/11/23: [name] enters with WNL volume. He reports consistent completion of Speak Out! Twice daily and is on Lesson 13. He has used daily HEP log to track his twice daily progress. He completed Speak Out! Lesson 13 hitting all dB targets and completing each section accurately. He self corrected when he spoke in between exercises without intent. We role played placing orders at [employer] and giving/spelling his name, birth date,  address and phone numbers at the doctor's office. Kyon maintained 72+dB in these with supervision cues. In  15 minute conversation re: controversial political issues, Kashon maintained 70-73dB with occasional min visual and gesture cues. He is to complete 1 on line session by next Monday 08/18/23.   08/06/23: [name] did not complete SpeakOut! Yesterday. He said he took the day off. I generated a log to track his twice daily and the lesson he has just completed.  He completed Speak Out! Lesson 9 hitting all dB targets with supervision cues. In conversation over 15 minutes with prompts, then spontaneous, Eluzer averaged 70dB with rare min A.   0/28/24: [name] reports consistently completing each lesson in Speak Out! Workbook twice a day. He enters knowing that he is on Lesson 9. Today we used an Theatre stage manager from [employer] (  November week 1) Kendra required occasional min verbal and visual cues to hold Ah for 10 seconds. He hit all dB targets in the lesson with rare min A (occasional min A for conversation exercises - divergent naming) In simple conversation Mekai required occasional min verbal and gesture cues to maintain 72dB - volume decreases and cognitive load increases - Yuniel verbalizes awareness of slow processing. Education provided for ways family members can assist him in participating in conversations with small groups of family. He told his other son about his PD - they endorsed that he will watch the "what is Parkinson's" webinar on speak out! Website.   10/23/24[name] reports completing Speak Out! 1x - continue to require him to complete this twice a day. He self correct intent voice 3x with supervision cues in initial conversation.  Targeted volume and intelligibility using Speak Out! Lesson 6. Pt required rare min  verbal cues, modeling, for volume and breath support. Pt averages the following volume levels:  Sustained AH: 93 dB  Counting:87  Reading (phrases): 87dB  Cognitive Exercise: 75dB Required usual mod verbal cues, modeling for carryover of intent /volume completing and explaining idioms   07/28/23: Thom reports completing Speak Out! Through lesson 5, however he did not complete each lesson twice. I reviewed requirement to complete each lesson 2x, twice a day. We completed Lesson 5 today.  Targeted volume and intelligibility using Speak Out! Lesson 5. Pt required occasional min  verbal cues, modeling, for volume and breath  support.  Pt averages the following volume levels:  Sustained AH: 90dB  Counting:84dB  Reading (phrases): 80dB  Cognitive Exercise: 78dB Required usual min to mod verbal cues, modeling for carryover of intent /volume on divergent naming task. Usual min semantic cues to generate 5-8 items in simple categories. In simple description re: holiday traditions, Ashden averaged 72dB with usual mod modeling, verbal and gesture cues.    07/23/23: Giovany brings Speak Out! Workbook to the session. He and [name] have watched the Learn About Parkinson's webinar on [employer]. Instructed them to have their son watch it as well. Khyri endorses "I have been speaking low for a long time, I didn't realize it"  Previewed the workbook with them, introduced requirement of twice daily practice, advancing lessons after 2 sessions. Demonstrated online Speak Out! Sessions. Targeted volume and intelligibility using Speak Out! Lesson 1. Pt required usual to  occasional min verbal cues and modeling, for volume and breath support.  Pt averages the following volume levels  Sustained AH: 85dB  Counting:85dB  Reading (phrases): 75dB  Cognitive Exercise: 72dB Required usual mod verbal cues, modeling for carryover of intent /volume generating 3 sentence descriptions of basic objects.  following structured practice. In simple conversation over 5 minutes, Jun averaged 70dB with usual mod visual and verbal cues.    07/09/23 (eval day): Introduced [employer]! Workbook to [employer]. Ordered work book - Introduced them to [employer], demonstrated location of Learn about [employer] and on Physiological scientist. Instructed them to watch webinar and an online practice session and have their 2 sons watch the webinar as well. Educated re: Automatic vs Intentional motor systems. [name] Id'd when he was speaking or reading with intent vs not using intent. By the end of the session, he was  conversing with intent to average 70dB with occasional min a  PATIENT EDUCATION: Education details: See Today's Treatment; See patient instructions; HEP for dysarthria; PD education Person educated: Patient and Spouse Education  method: Explanation, Demonstration, Verbal cues, and Handouts Education comprehension: verbalized understanding, returned demonstration, verbal cues required, and needs further education  HOME EXERCISE PROGRAM: Speak Out! Workbook ordered - they are to bring it to ST when it arrives   GOALS: Goals reviewed with patient? Yes  SHORT TERM GOALS: Target date: 08/06/23  Pt will complete HEP hitting target dB's with occasional min A over 1 week Baseline: Goal status: MET  2.  Pt will complete HEP twice a day, completing at least 1 on line session with Parkinson [employer] website Baseline:  Goal status: MET  3.  Pt will averge 72dB 18/20 sentences with rare min A Baseline:  Goal status: MET  4.  Pt will average 72dB over 12 minute converation with rare min A Baseline:  Goal status: MET  5.  Pt and his family will watch Learn about Parkinson's webinar on [employer] website Baseline:  Goal status: MET              LONG TERM GOALS: Target date: 09/03/23  Pt will complete HEP for dysarthria twice daily accurately over 1 week with mod I Baseline:  Goal status:MET   2.  Pt will complete 2 on line practice sessions from [employer] online Baseline:  Goal status: ONGOING  3.  Pt will maintain 70dB over 15 minute conversation with mod I Baseline:  Goal status: ONGOING  4.  Pt will improve score on Communicative Effectiveness Survey by 3 points Baseline: 19 - spouse rated due to reduced pt awareness Goal status: ONGOING  5.  Pt/spouse will verbalize requirement to continue Speak Out! Daily practice with workbook and online up d/c of ST Baseline:  Goal status: ONGOING   ASSESSMENT:  CLINICAL IMPRESSION: Patient is a 72  y.o. 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 He is accompanied by his spouse, Teryl Lucy. Other verbalizes reduced awareness of low volume, stating his spouse needs a hearing aid. He does endorse he is "a little low" at times. [name] reports frequent requests for repetition and difficulty hearing Tavarius. They deny difficulty swallowing, coughing or getting choked or strangled. Meds are swallowed without difficulty. They report some memory issues, such as walking into a room and forgetting why, however they both feel that Valentin' current cognitive function is acceptable for day to day activities. Zackari' volume improved significantly after initial training for speak with intent and he demonstrated carry over during simple short conversation at the end of the evaluation. I recommend skilled ST to maximize intelligibility for independence, QOL, reduce caregiver burden.   OBJECTIVE IMPAIRMENTS: Objective impairments include memory and dysarthria. These impairments are limiting patient from effectively communicating at home and in community.Factors affecting potential to achieve goals and functional outcome are medical prognosis.. Patient will benefit from skilled SLP services to address above impairments and improve overall function.  REHAB POTENTIAL: Good  PLAN:  SLP FREQUENCY: 2x/week  SLP DURATION: 8 weeks  PLANNED INTERVENTIONS: Aspiration precaution training, Diet toleration management , Environmental controls, Cueing hierachy, Cognitive reorganization, Internal/external aids, Functional tasks, Multimodal communication approach, and SLP instruction and feedback MBSS    Lisset Ketchem, Radene Journey, CCC-SLP 08/11/2023, 10:25 AM

## 2023-08-11 NOTE — Therapy (Signed)
OUTPATIENT PHYSICAL THERAPY NEURO TREATMENT   Patient Name: Joseph Lamb MRN: [id number] DOB:August 20, 1951, 72 y.o., male Today's Date: 08/11/2023   PCP: Lupita Raider, MD  REFERRING PROVIDER:  Vladimir Faster, DO  END OF SESSION:  PT End of Session - 08/11/23 1019     Visit Number 7    Number of Visits 13    Date for PT Re-Evaluation 09/07/23    Authorization Type [employer] Medicare    PT Start Time 1015    PT Stop Time 1100    PT Time Calculation (min) 45 min    Equipment Utilized During Treatment Gait belt    Activity Tolerance Patient tolerated treatment well    Behavior During Therapy WFL for tasks assessed/performed             Past Medical History:  Diagnosis Date   Adverse effect of antihyperlipidemic and antiarteriosclerotic drugs, initial encounter 10/11/2021   Arrhythmia    Atrial fibrillation 10/11/2021   Cerebrovascular disease    moderate per 2021 MRI   Cholecystitis 09/01/2021   Chronic kidney disease, stage 3 unspecified 10/11/2021   Dizzy spells 03/08/2020   Erectile dysfunction 10/11/2021   Essential hypertension 06/14/2015   Glaucoma 08/31/2021   Hyperlipidemia 06/14/2015   Mild cognitive impairment of uncertain or unknown etiology 05/21/2023   Multiple lacunar infarcts    bilateral centrum semiovale and right parietal cortex.   Obesity 10/11/2021   Obstructive sleep apnea    no CPAP   Primary open angle glaucoma (POAG) of left eye, moderate stage 10/11/2021   Primary open-angle glaucoma, right eye, mild stage 10/11/2021   Renal insufficiency 09/06/2022   Sinus bradycardia 03/08/2020   Tremor 10/11/2021   Type II diabetes mellitus 10/11/2021   Past Surgical History:  Procedure Laterality Date   CHOLECYSTECTOMY N/A 08/31/2021   Procedure: LAPAROSCOPIC CHOLECYSTECTOMY;  Surgeon: Stechschulte, Hyman Hopes, MD;  Location: [facility] OR;  Service: General;  Laterality: N/A;   EYE SURGERY     Glaucoma surgery   PROSTATE SURGERY     Patient Active  Problem List   Diagnosis Date Noted   Mild cognitive impairment of uncertain or unknown etiology 05/21/2023   Obstructive sleep apnea    Multiple lacunar infarcts    Cerebrovascular disease    Renal insufficiency 09/06/2022   Atrial fibrillation 10/11/2021   Chronic kidney disease, stage 3 unspecified 10/11/2021   Obesity 10/11/2021   Erectile dysfunction 10/11/2021   Primary open angle glaucoma (POAG) of left eye, moderate stage 10/11/2021   Primary open-angle glaucoma, right eye, mild stage 10/11/2021   Tremor 10/11/2021   Type II diabetes mellitus 10/11/2021   Cholecystitis 09/01/2021   Glaucoma 08/31/2021   Arrhythmia    Dizzy spells 03/08/2020   Sinus bradycardia 03/08/2020   Essential hypertension 06/14/2015   Hyperlipidemia 06/14/2015    ONSET DATE: 06/19/2023  REFERRING DIAG: G20.A1 (ICD-10-CM) - Parkinson's disease without dyskinesia or fluctuating manifestations (HCC)   THERAPY DIAG:  Muscle weakness (generalized)  Other abnormalities of gait and mobility  Unsteadiness on feet  Rationale for Evaluation and Treatment: Rehabilitation  SUBJECTIVE:  SUBJECTIVE STATEMENT: No changes. Reports things are going well at home.   Denies falls/near falls. Wants to review band exercises. Otherwise doing well.   Pt accompanied by:  Spouse, Joseph Lamb  PERTINENT HISTORY: PMH: PD,  A fb, stage 3 CKD, HLD, HTN, multiple lacunar infarcts, obesity, type II diabetes    Per Dr. Arbutus Leas:  Idiopathic Parkinson's disease, moderate             -Diagnosis was recently made, but likely has had this for a number of years  PAIN:  Are you having pain? No  Vitals:   08/11/23 1022 08/11/23 1031  BP: 102/62 109/76  Pulse: 78 78     PRECAUTIONS: Fall  RED FLAGS: None   WEIGHT BEARING RESTRICTIONS:  No  FALLS: Has patient fallen in last 6 months? No  LIVING ENVIRONMENT: Lives with: lives with their spouse Lives in: House/apartment Stairs: Yes: External: 3 steps; bilateral but cannot reach both Has following equipment at home: None Has trouble getting up from sitting in the bathtub   PLOF: Independent Sometimes needs help with buttoning   PATIENT GOALS: Slow down the progression, maintain as much as possible.   OBJECTIVE:  Note: Objective measures were completed at Evaluation unless otherwise noted.  COGNITION: Overall cognitive status: Within functional limits for tasks assessed and Impaired Neurocognitive testing in February, 2024 with evidence of mild cognitive impairment. This was largely stable compared to prior testing with Dr. Roseanne Reno in 2021.    SENSATION: Light touch: WFL Pt reports sometimes numbness/tingling when getting up in the morning   COORDINATION: Heel to shin: WNL   MUSCLE TONE: Noted more stiffness in LLE    POSTURE: rounded shoulders and forward head  RUE resting tremors   LOWER EXTREMITY ROM:     Noted slightly decr R knee extension AROM   LOWER EXTREMITY MMT:    MMT Right Eval Left Eval  Hip flexion 5 5  Hip extension    Hip abduction    Hip adduction    Hip internal rotation    Hip external rotation    Knee flexion 5 5  Knee extension 5 5  Ankle dorsiflexion 5 5  Ankle plantarflexion    Ankle inversion    Ankle eversion    (Blank rows = not tested)  BED MOBILITY:  Pt reports no difficulty   TRANSFERS: Assistive device utilized: None  Sit to stand: SBA Stand to sit: SBA Performs with no UE support   Pt notes difficulty getting in and out of the car, with bringing the legs in.   Pt reports difficulty getting up off the couch, sometimes will take a couple of tries.    FUNCTIONAL TESTS:  5 times sit to stand: 14 seconds with no UE support  10 meter walk test: 10.91 seconds = 3.01 ft/sec    TODAY'S TREATMENT:  Vitals:   08/11/23 1022 08/11/23 1031  BP: 102/62 109/76  Pulse: 78 78   TherAct: BP readings initially on lower end during today's session; performed SciFit x 6 minutes with reciprocal arm movements to help increase BP (reassessed BP after SciFit) Pre SciFit: 102/62 mmHg, BP 78 Post SciFit: 109/76 mmHg, 78 bpm Patient asymptomatic throughout session  NMR Large amplitude lateral stepping with horizontal shoulder abduction with green theraband mirroring second observing student with therapist CGA 1 x 10 bil alt  Large amplitude step back with shoulder extension with green theraband mirroring second observing student with therapist CGA 1 x 10 bil alt  Resisted gait with constant A/P resistance 2 x 115 feet with emphasis on weight shift, upright posture, arm swing, and amplitude of movements (CGA)  Resisted gait with pertubations at random 2 x 115 feet with emphasis on weight shift, upright posture, stepping reaction, and amplitude of movements (CGA - no major LOB with appropriate stepping strategy) RPE: 7/10 6 Blaze pods (2 on mirror and 4 behind patient) on random setting for improved cross body reaching and backward stepping reaction on compliant surface.  Performed on 1 minute intervals with 30 rest periods.  Pt requires CGA guarding with minA 1x due to instability . Round 1:  standing on airex with step backs to blaze pods and cross body reaching to limits of stability overhead setup.  13 hits. Round 2:  standing on airex with step backs to blaze pods and cross body reaching to limits of stability overhead setup.  15 hits. Round 3:  standing on airex with step backs to blaze pods and cross body reaching to limits of stability overhead setup.  16 hits. Notable errors/deficits:  required cues for full step back onto airex pad to maintain amplitude of movement and sometimes  reached with same side instead of cross body but was able to self correct   Stepping over 6" hurdles with 6lb med ball slam with twist for anticipatory reaction, postural control, and stability with NBOS 4 x 10 feet (CGA)  PATIENT EDUCATION: Education details: Continue HEP Person educated: Patient and Spouse Education method: Explanation, Demonstration, Verbal cues, and Handouts Education comprehension: verbalized understanding, returned demonstration, and needs further education  HOME EXERCISE PROGRAM: Standing PWR moves   Access Code: [email or web address] URL: [email or web address] Date: 07/30/2023 Prepared by: Sherlie Ban  Exercises - Staggered Stance Weight Shift with Arms Reaching  - 1-2 x daily - 5 x weekly - 2 sets - 10 reps  GOALS: Goals reviewed with patient? Yes  SHORT TERM GOALS:  Target date: 08/06/2023  Pt will be independent with initial HEP for PD specific deficits in order to build upon functional gains made in therapy. Baseline: Goal status: MET  2.  Pt will perform posterior stepping strategy in 3 steps or less in order to demo improved balance strategies.  Baseline: at least 5 steps, min A for balance   4-5 small steps with min A from therapist for balance (10/30) Goal status: NOT MET  3.  Pt will improve 5x sit<>stand to less than or equal to 12 sec to demonstrate improved functional strength and transfer efficiency.   Baseline: 14 seconds with no UE support  11.3 seconds (10/30) Goal status: MET   LONG TERM GOALS: Target date: 09/03/2023   Pt will be independent with final  HEP for PD specific deficits in order to build upon functional gains made in therapy. Baseline:  Goal status: INITIAL  2.  Pt will improve miniBEST to  at least a 22/28 in order to demo decr fall risk Baseline: 17/28 Goal status: INITIAL  3.  Pt will verbalize understanding of local Parkinson's disease resources, including options for continue community fitness.   Baseline:  Goal status: INITIAL  4.  Pt will improve TUG and TUG cog score to less than or equal to 10% difference for improved dual task/decreased fall risk.  Baseline:  Goal status: INITIAL  5.  Pt will get up from lower/softer surfaces to mimic a couch/recliner mod I in order to demo improved transfers  Baseline:  Goal status: INITIAL    ASSESSMENT:  CLINICAL IMPRESSION:  Skilled PT session emphasized wide range of balance exercises with emphasis on stepping reaction particularly backwards, cross body postural control exercises, and large amplitude movements. Patient requires intermittent cues for full step back onto airex requiring minA for stability but otherwise takes appropriate backwards steps. BP initally low at start of session but improved with neural priming warmup. No major LOB noted with resisted gait with pertubations. Will continue per POC.   OBJECTIVE IMPAIRMENTS: Abnormal gait, decreased activity tolerance, decreased balance, decreased cognition, decreased coordination, decreased mobility, difficulty walking, decreased strength, impaired flexibility, impaired UE functional use, and postural dysfunction.   ACTIVITY LIMITATIONS: carrying, bending, stairs, transfers, dressing, and locomotion level  PARTICIPATION LIMITATIONS: community activity  PERSONAL FACTORS: Age, Behavior pattern, Past/current experiences, Time since onset of injury/illness/exacerbation, and 3+ comorbidities:  PD,  A fb, stage 3 CKD, HLD, HTN, multiple lacunar infarcts, obesity, type II diabetes   are also affecting patient's functional outcome.   REHAB POTENTIAL: Good  CLINICAL DECISION MAKING: Evolving/moderate complexity  EVALUATION COMPLEXITY: Moderate  PLAN:  PT FREQUENCY: 2x/week  PT DURATION: 8 weeks  PLANNED INTERVENTIONS: Therapeutic exercises, Therapeutic activity, Neuromuscular re-education, Balance training, Gait training, Patient/Family education, Self Care, Joint mobilization,  Stair training, DME instructions, and Re-evaluation  PLAN FOR NEXT SESSION: work on SLS, stepping strategies, larger amplitude movement patterns . SciFit for reciprocal movement, endurance. Gait exercises with arm swing, Posterior balance, could trial resisted gait outdoors on uneven surface   Carmelia Bake, PT, DPT 08/11/2023, 12:24 PM

## 2023-08-13 ENCOUNTER — Ambulatory Visit: Payer: Medicare Other | Admitting: Occupational Therapy

## 2023-08-13 ENCOUNTER — Ambulatory Visit: Payer: Medicare Other | Admitting: Physical Therapy

## 2023-08-13 ENCOUNTER — Encounter: Payer: Self-pay | Admitting: Physical Therapy

## 2023-08-13 ENCOUNTER — Ambulatory Visit: Payer: Medicare Other | Admitting: Speech Pathology

## 2023-08-13 ENCOUNTER — Encounter: Payer: Self-pay | Admitting: Speech Pathology

## 2023-08-13 DIAGNOSIS — R29818 Other symptoms and signs involving the nervous system: Secondary | ICD-10-CM

## 2023-08-13 DIAGNOSIS — R251 Tremor, unspecified: Secondary | ICD-10-CM

## 2023-08-13 DIAGNOSIS — R471 Dysarthria and anarthria: Secondary | ICD-10-CM | POA: Diagnosis not present

## 2023-08-13 DIAGNOSIS — R2689 Other abnormalities of gait and mobility: Secondary | ICD-10-CM

## 2023-08-13 DIAGNOSIS — M6281 Muscle weakness (generalized): Secondary | ICD-10-CM

## 2023-08-13 DIAGNOSIS — R278 Other lack of coordination: Secondary | ICD-10-CM

## 2023-08-13 DIAGNOSIS — R29898 Other symptoms and signs involving the musculoskeletal system: Secondary | ICD-10-CM

## 2023-08-13 DIAGNOSIS — R2681 Unsteadiness on feet: Secondary | ICD-10-CM

## 2023-08-13 NOTE — Patient Instructions (Signed)
   Compensation Strategies for Tremors  When eating, try the following: . Eat out of bowls, divided plates, or use a plate guard (available at a medical supply store) and eat with a spoon so that you have an edge to scoop up food. . Try raising your plate so that there is less distance between the plate and mouth. . Try stabilizing elbows on the table or against your body. . Use utensil with built-up/larger grips as they are easier to hold.  When writing, try the following:   . Stabilize forearm on the table . Take your time as rushing/being stressed can increase tremors. . Try a felt-tipped pen, it does not glide as much.  Avoid gel pens (they move too much). . Consider using pre-printed labels with your name and address (carry them with you when you go out) or you can get stamps with your address or signature on it. . Use a small tape recorder to record messages/reminders for yourself. . Use pens with bigger grips.  When brushing your teeth, putting on make-up, or styling hair, try the following: . Use an electric toothbrush. . Use items with built-up grips. . Stabilize your elbows against your body or on the counter. . Use long-handled brushes/combs. . Use a hair dryer with a stand.  In general: . Avoid stress, fatigue, or rushing as this can increase tremors. . Sit down for activities that require more control/coordination.

## 2023-08-13 NOTE — Therapy (Signed)
OUTPATIENT PHYSICAL THERAPY NEURO TREATMENT   Patient Name: Joseph Lamb MRN: [id number] DOB:August 19, 1951, 72 y.o., male Today's Date: 08/13/2023   PCP: Lupita Raider, MD  REFERRING PROVIDER:  Vladimir Faster, DO  END OF SESSION:  PT End of Session - 08/13/23 0849     Visit Number 8    Number of Visits 13    Date for PT Re-Evaluation 09/07/23    Authorization Type [employer] Medicare    PT Start Time 0848    PT Stop Time 0928    PT Time Calculation (min) 40 min    Equipment Utilized During Treatment Gait belt    Activity Tolerance Patient tolerated treatment well    Behavior During Therapy [facility] for tasks assessed/performed             Past Medical History:  Diagnosis Date   Adverse effect of antihyperlipidemic and antiarteriosclerotic drugs, initial encounter 10/11/2021   Arrhythmia    Atrial fibrillation 10/11/2021   Cerebrovascular disease    moderate per 2021 MRI   Cholecystitis 09/01/2021   Chronic kidney disease, stage 3 unspecified 10/11/2021   Dizzy spells 03/08/2020   Erectile dysfunction 10/11/2021   Essential hypertension 06/14/2015   Glaucoma 08/31/2021   Hyperlipidemia 06/14/2015   Mild cognitive impairment of uncertain or unknown etiology 05/21/2023   Multiple lacunar infarcts    bilateral centrum semiovale and right parietal cortex.   Obesity 10/11/2021   Obstructive sleep apnea    no CPAP   Primary open angle glaucoma (POAG) of left eye, moderate stage 10/11/2021   Primary open-angle glaucoma, right eye, mild stage 10/11/2021   Renal insufficiency 09/06/2022   Sinus bradycardia 03/08/2020   Tremor 10/11/2021   Type II diabetes mellitus 10/11/2021   Past Surgical History:  Procedure Laterality Date   CHOLECYSTECTOMY N/A 08/31/2021   Procedure: LAPAROSCOPIC CHOLECYSTECTOMY;  Surgeon: Stechschulte, Hyman Hopes, MD;  Location: [facility] OR;  Service: General;  Laterality: N/A;   EYE SURGERY     Glaucoma surgery   PROSTATE SURGERY     Patient Active  Problem List   Diagnosis Date Noted   Mild cognitive impairment of uncertain or unknown etiology 05/21/2023   Obstructive sleep apnea    Multiple lacunar infarcts    Cerebrovascular disease    Renal insufficiency 09/06/2022   Atrial fibrillation 10/11/2021   Chronic kidney disease, stage 3 unspecified 10/11/2021   Obesity 10/11/2021   Erectile dysfunction 10/11/2021   Primary open angle glaucoma (POAG) of left eye, moderate stage 10/11/2021   Primary open-angle glaucoma, right eye, mild stage 10/11/2021   Tremor 10/11/2021   Type II diabetes mellitus 10/11/2021   Cholecystitis 09/01/2021   Glaucoma 08/31/2021   Arrhythmia    Dizzy spells 03/08/2020   Sinus bradycardia 03/08/2020   Essential hypertension 06/14/2015   Hyperlipidemia 06/14/2015    ONSET DATE: 06/19/2023  REFERRING DIAG: G20.A1 (ICD-10-CM) - Parkinson's disease without dyskinesia or fluctuating manifestations (HCC)   THERAPY DIAG:  Muscle weakness (generalized)  Other symptoms and signs involving the nervous system  Unsteadiness on feet  Other abnormalities of gait and mobility  Rationale for Evaluation and Treatment: Rehabilitation  SUBJECTIVE:  SUBJECTIVE STATEMENT:  No falls, just a little bit of staggers.   Pt accompanied by:  Spouse, [name]  PERTINENT HISTORY: PMH: PD,  A fb, stage 3 CKD, HLD, HTN, multiple lacunar infarcts, obesity, type II diabetes    Per Dr. Arbutus Leas:  Idiopathic Parkinson's disease, moderate             -Diagnosis was recently made, but likely has had this for a number of years  PAIN:  Are you having pain? No  There were no vitals filed for this visit.  PRECAUTIONS: Fall  RED FLAGS: None   WEIGHT BEARING RESTRICTIONS: No  FALLS: Has patient fallen in last 6 months? No  LIVING  ENVIRONMENT: Lives with: lives with their spouse Lives in: House/apartment Stairs: Yes: External: 3 steps; bilateral but cannot reach both Has following equipment at home: None Has trouble getting up from sitting in the bathtub   PLOF: Independent Sometimes needs help with buttoning   PATIENT GOALS: Slow down the progression, maintain as much as possible.   OBJECTIVE:  Note: Objective measures were completed at Evaluation unless otherwise noted.  COGNITION: Overall cognitive status: Within functional limits for tasks assessed and Impaired Neurocognitive testing in February, 2024 with evidence of mild cognitive impairment. This was largely stable compared to prior testing with Dr. Roseanne Reno in 2021.    SENSATION: Light touch: WFL Pt reports sometimes numbness/tingling when getting up in the morning   COORDINATION: Heel to shin: WNL   MUSCLE TONE: Noted more stiffness in LLE    POSTURE: rounded shoulders and forward head  RUE resting tremors   LOWER EXTREMITY ROM:     Noted slightly decr R knee extension AROM   LOWER EXTREMITY MMT:    MMT Right Eval Left Eval  Hip flexion 5 5  Hip extension    Hip abduction    Hip adduction    Hip internal rotation    Hip external rotation    Knee flexion 5 5  Knee extension 5 5  Ankle dorsiflexion 5 5  Ankle plantarflexion    Ankle inversion    Ankle eversion    (Blank rows = not tested)  BED MOBILITY:  Pt reports no difficulty   TRANSFERS: Assistive device utilized: None  Sit to stand: SBA Stand to sit: SBA Performs with no UE support   Pt notes difficulty getting in and out of the car, with bringing the legs in.   Pt reports difficulty getting up off the couch, sometimes will take a couple of tries.    FUNCTIONAL TESTS:  5 times sit to stand: 14 seconds with no UE support  10 meter walk test: 10.91 seconds = 3.01 ft/sec    TODAY'S TREATMENT:                                                                                                                                 Therapeutic Exercise:  SciFit with BUE/BLE at  [employer] 4.0 for 8 minutes for neural priming, reciprocal movement patterns, incr amplitude of stepping, activity tolerance. Pt keep spm >90. Pt reporting RPE as a 6/10   NMR  Pt performs PWR! Moves in quadruped position   PWR! Up for improved posture x10 reps,   PWR! Rock for improved weight shifting x10 reps   PWR! Twist for improved trunk rotation x8 reps each side   PWR! Step for improved step initiation x10 reps, pt improving in step amplitude with each rep   Cues provided for larger amplitude movement, purpose of each exercise, and focusing on extending elbow and opening up hands. Pt reporting feeling a good stretch and workout with these exercises and also wants to work on them at home.  Pt able to stand up and down from floor with using BUE support on mat table and going into half kneel with mod I. Pt also performs without UE support on mat table with supervision   On blue foam beam: 5 reps sit <> stands with no UE support  10 reps sit <> stands while holding 6# medicine ball and reaching it over head and slamming it on floor and catching it, focus on extending arms overhead before throwing it down, pt reporting 8-9/10 RPE afterwards   PATIENT EDUCATION: Education details: Continue HEP, added quadruped PWR moves to pt's HEP due to pt enjoying these and for mobility due to rigidity/stiffness, discussed pt can switch off days that he does the standing PWR and the quadruped PWR moves. And educate making sure that pt performs next to a couch to get on and off the floor.  Person educated: Patient and Spouse Education method: Explanation, Demonstration, Verbal cues, and Handouts Education comprehension: verbalized understanding, returned demonstration, and needs further education  HOME EXERCISE PROGRAM: Standing PWR moves  Forward/backward stepping  Access Code:  [email or web address] URL: [email or web address] Date: 07/30/2023 Prepared by: Sherlie Ban  Exercises - Staggered Stance Weight Shift with Arms Reaching  - 1-2 x daily - 5 x weekly - 2 sets - 10 reps  GOALS: Goals reviewed with patient? Yes  SHORT TERM GOALS:  Target date: 08/06/2023  Pt will be independent with initial HEP for PD specific deficits in order to build upon functional gains made in therapy. Baseline: Goal status: MET  2.  Pt will perform posterior stepping strategy in 3 steps or less in order to demo improved balance strategies.  Baseline: at least 5 steps, min A for balance   4-5 small steps with min A from therapist for balance (10/30) Goal status: NOT MET  3.  Pt will improve 5x sit<>stand to less than or equal to 12 sec to demonstrate improved functional strength and transfer efficiency.   Baseline: 14 seconds with no UE support  11.3 seconds (10/30) Goal status: MET   LONG TERM GOALS: Target date: 09/03/2023   Pt will be independent with final  HEP for PD specific deficits in order to build upon functional gains made in therapy. Baseline:  Goal status: INITIAL  2.  Pt will improve miniBEST to at least a 22/28 in order to demo decr fall risk Baseline: 17/28 Goal status: INITIAL  3.  Pt will verbalize understanding of local Parkinson's disease resources, including options for continue community fitness.  Baseline:  Goal status: INITIAL  4.  Pt will improve TUG and TUG cog score to less than or equal to 10% difference for improved dual task/decreased fall risk.  Baseline:  Goal status: INITIAL  5.  Pt  will get up from lower/softer surfaces to mimic a couch/recliner mod I in order to demo improved transfers  Baseline:  Goal status: INITIAL    ASSESSMENT:  CLINICAL IMPRESSION:  Started session today on the SciFit for larger amplitude movements with pt able to tolerate an incr in resistance with RPE at 6/10. Worked on floor transfers with  pt able to perform with mod I when using BUE support on mat table. Performed quadruped PWR moves on the floor for mobility and larger amplitude movements with intermittent cues for extending elbows and opening up hands. Pt enjoying these exercises, provided them as part of HEP. Will continue per POC.    OBJECTIVE IMPAIRMENTS: Abnormal gait, decreased activity tolerance, decreased balance, decreased cognition, decreased coordination, decreased mobility, difficulty walking, decreased strength, impaired flexibility, impaired UE functional use, and postural dysfunction.   ACTIVITY LIMITATIONS: carrying, bending, stairs, transfers, dressing, and locomotion level  PARTICIPATION LIMITATIONS: community activity  PERSONAL FACTORS: Age, Behavior pattern, Past/current experiences, Time since onset of injury/illness/exacerbation, and 3+ comorbidities:  PD,  A fb, stage 3 CKD, HLD, HTN, multiple lacunar infarcts, obesity, type II diabetes   are also affecting patient's functional outcome.   REHAB POTENTIAL: Good  CLINICAL DECISION MAKING: Evolving/moderate complexity  EVALUATION COMPLEXITY: Moderate  PLAN:  PT FREQUENCY: 2x/week  PT DURATION: 8 weeks  PLANNED INTERVENTIONS: Therapeutic exercises, Therapeutic activity, Neuromuscular re-education, Balance training, Gait training, Patient/Family education, Self Care, Joint mobilization, Stair training, DME instructions, and Re-evaluation  PLAN FOR NEXT SESSION: work on SLS, stepping strategies, larger amplitude movement patterns . SciFit for reciprocal movement, endurance. Gait exercises with arm swing, Posterior balance, could trial resisted gait outdoors on uneven surface   Drake Leach, PT, DPT 08/13/2023, 11:48 AM

## 2023-08-13 NOTE — Therapy (Signed)
OUTPATIENT OCCUPATIONAL THERAPY PARKINSON'S TREATMENT  Patient Name: Joseph Lamb MRN: [id number] DOB:1951-09-03, 72 y.o., male Today's Date: 08/13/2023  PCP: Lupita Raider, MD REFERRING PROVIDER: Vladimir Faster, DO  END OF SESSION:  OT End of Session - 08/13/23 0936     Visit Number 5    Number of Visits 20    Date for OT Re-Evaluation 10/03/23    Authorization Type [employer] Medicare 2024 10% coinsur NO Ded VL: [place] Auth Not Reqd    Progress Note Due on Visit 10    OT Start Time 0934    OT Stop Time 1014    OT Time Calculation (min) 40 min    Activity Tolerance Patient tolerated treatment well    Behavior During Therapy [facility] for tasks assessed/performed             Past Medical History:  Diagnosis Date   Adverse effect of antihyperlipidemic and antiarteriosclerotic drugs, initial encounter 10/11/2021   Arrhythmia    Atrial fibrillation 10/11/2021   Cerebrovascular disease    moderate per 2021 MRI   Cholecystitis 09/01/2021   Chronic kidney disease, stage 3 unspecified 10/11/2021   Dizzy spells 03/08/2020   Erectile dysfunction 10/11/2021   Essential hypertension 06/14/2015   Glaucoma 08/31/2021   Hyperlipidemia 06/14/2015   Mild cognitive impairment of uncertain or unknown etiology 05/21/2023   Multiple lacunar infarcts    bilateral centrum semiovale and right parietal cortex.   Obesity 10/11/2021   Obstructive sleep apnea    no CPAP   Primary open angle glaucoma (POAG) of left eye, moderate stage 10/11/2021   Primary open-angle glaucoma, right eye, mild stage 10/11/2021   Renal insufficiency 09/06/2022   Sinus bradycardia 03/08/2020   Tremor 10/11/2021   Type II diabetes mellitus 10/11/2021   Past Surgical History:  Procedure Laterality Date   CHOLECYSTECTOMY N/A 08/31/2021   Procedure: LAPAROSCOPIC CHOLECYSTECTOMY;  Surgeon: Stechschulte, Hyman Hopes, MD;  Location: [facility] OR;  Service: General;  Laterality: N/A;   EYE SURGERY     Glaucoma surgery   PROSTATE  SURGERY     Patient Active Problem List   Diagnosis Date Noted   Mild cognitive impairment of uncertain or unknown etiology 05/21/2023   Obstructive sleep apnea    Multiple lacunar infarcts    Cerebrovascular disease    Renal insufficiency 09/06/2022   Atrial fibrillation 10/11/2021   Chronic kidney disease, stage 3 unspecified 10/11/2021   Obesity 10/11/2021   Erectile dysfunction 10/11/2021   Primary open angle glaucoma (POAG) of left eye, moderate stage 10/11/2021   Primary open-angle glaucoma, right eye, mild stage 10/11/2021   Tremor 10/11/2021   Type II diabetes mellitus 10/11/2021   Cholecystitis 09/01/2021   Glaucoma 08/31/2021   Arrhythmia    Dizzy spells 03/08/2020   Sinus bradycardia 03/08/2020   Essential hypertension 06/14/2015   Hyperlipidemia 06/14/2015    ONSET DATE: 06/19/2023  REFERRING DIAG: G20.A1 (ICD-10-CM) - Parkinson's disease without dyskinesia or fluctuating manifestations  THERAPY DIAG:  Muscle weakness (generalized)  Other symptoms and signs involving the nervous system  Other lack of coordination  Other symptoms and signs involving the musculoskeletal system  Tremors of nervous system  Rationale for Evaluation and Treatment: Rehabilitation  SUBJECTIVE:   SUBJECTIVE STATEMENT: Pt reports stretch with PWR! Twist.   Pt accompanied by: self   PERTINENT HISTORY:  PMH: PD (newly diagnosed), A fb, stage 3 CKD, HLD, HTN, multiple lacunar infarcts, obesity, type II diabetes  Re: Dr. Arbutus Leas Neuro appt: 06/19/2023 Consultation is for the  evaluation of tremor and Parkinson's.  He was first seen by Dr. Karel Jarvis in 2016 and Dr. Marjory Lies prior to that.  Initial evaluation for his further evaluation of memory change, but at that point in time his MoCA was 29/30.  He came back for further evaluation in 2021.  In the June, 2021, it was noted that patient had began to have hand tremors for 5 months prior.  At that point in time, Dr. Karel Jarvis did not see much  on the examination, however.  By August, 2022, Dr. Karel Jarvis did note occasional right thumb resting tremor and mild cogwheeling in the office.  The following visits were with Marlowe Kays.  With time, she began to note right greater than left tremors at rest, mouth tremor, sialorrhea and increased tone.  He was started on levodopa by her in February, 2024.  He apparently discontinued it due to the fact that he felt his heart was fluttering and he d/c the medication. He states that mostly he noted no difference in tremor so he d/c it.  He saw Dr. Milbert Coulter for memory testing in August, 2024, demonstrating mild cognitive impairment.  Dr. Milbert Coulter referred him to Dr. Arbutus Leas for further evaluation.   PRECAUTIONS: None  WEIGHT BEARING RESTRICTIONS: No  PAIN:  Are you having pain? Yes: NPRS scale: 4-5/10 Pain location: R hand and shoulder & abdominal Pain description: gnawing pain Aggravating factors: When I get upset/tired, eat certain foods - hot/spicy ie) hamburgers, BBQ Relieving factors: Stop the shaking in R hand/arm, Tums  FALLS: Has patient fallen in last 6 months? No  LIVING ENVIRONMENT: Lives with: lives with their spouse and lives with their son Lives in: House/apartment Stairs: Yes: External: 3 steps; bilateral but cannot reach both Has following equipment at home: shower chair and Grab bars  PLOF: Independent Sometimes needs help with buttoning & tying laces  PATIENT GOALS: To help with stiffness and dexterity in his hands/arms ie) has/had trouble turning to reach to back seat of the car, trouble with some dressing/buttons and other ADLs compared to PLOF.  OBJECTIVE:  Note: Objective measures were completed at Evaluation unless otherwise noted.  HAND DOMINANCE: Right  ADLs: Overall ADLs: Mod I Transfers/ambulation related to ADLs: Ind without AE at this time Eating: Does it himself, including cutting food. Grooming: "I do an adequate job." Re: shaving (only missing a little bit) UB  Dressing: Wife may help to pull down his shirt in the back LB Dressing: Takes extra time to tie laces Toileting: Mod I Bathing: Decent job. 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 and they were bubble packed but thinks he'll have to  Financial management: Wife is the Management consultant" Handwriting: 50% legible and Mild micrographia - see eval for sample writing  MOBILITY STATUS: Independent  POSTURE COMMENTS:  rounded shoulders and forward head  ACTIVITY TOLERANCE: Activity tolerance: Good  FUNCTIONAL OUTCOME MEASURES: Fastening/unfastening 3 buttons: 42.68 sec Physical performance test: PPT#2 (simulated eating) 15.1 sec & PPT#4 (donning/doffing jacket): 16.09 sec  UEFI = 62/80 (see specific areas in eval)  COORDINATION: 9 Hole Peg test: Right: 36.28  sec;  Left: 36.4 sec Box and Blocks:  Right 47 blocks, Left 43 blocks Tremors: Resting and Right  UE ROM:  WFL - slight end range limitations  UE MMT:   WFL Grip Strength: Right 57.5, 71.4, 75.6 Average 68.2 lbs Left 55.9, 70.5, 83.3 Average 69.9 lbs  SENSATION: WFL  MUSCLE TONE: TBA  COGNITION: Overall cognitive status: Within functional limits for tasks assessed - See ST evaluation  OBSERVATIONS: Dystonia  TODAY'S TREATMENT:                                                                                                                                Reviewed and progressed PWR! Sitting moves requiring extensive cueing for proper completion as needed for desired movements. Pt held BoomWhackers and sat against wall to complete  PWR! Twist with cues to look to hands. Agility dots used with PWR! Step for visual boosts.   OT educated pt on tremor reduction strategies with eating as noted in pt instructions.    PATIENT EDUCATION: Education details: tremor reduction strategies; PWR! Sitting Person educated: Patient Education method: Explanation, Demonstration, and Handouts Education comprehension: verbalized understanding, returned demonstration, and needs further education  HOME EXERCISE PROGRAM: 07/30/2023: PWR! Hands; BIG ADLs 08/06/23: handwriting strategies 08/11/2023: PWR! Sitting; Cape method 08/13/2023: Tremor reduction strategies  GOALS: Goals reviewed with patient? Yes  GOALS:  SHORT TERM GOALS: Target date: 08/27/2023    Pt will be independent with PD specific HEP.  Baseline: not yet initiated Goal status: IN PROGRESS  2.  Pt will verbalize understanding of adapted strategies to maximize safety and independence with ADLs/IADLs.  Baseline: not yet initiated Goal status: INITIAL  3.  Pt will write his name/simple list with no significant decrease in size and maintain 75% legibility.  Baseline: 50% legibility Goal status: INITIAL  4.  Pt will demonstrate improved fine motor coordination for ADLs as evidenced by decreasing 9 hole peg test score for BUE and by 3-5 secs  Baseline: Right: 36.28  sec; Left: 36.4 sec Goal status: INITIAL  5.  Pt will be able to place at least 3-5 additional blocks using B hand with completion of Box and Blocks test.  Baseline: Right 47 blocks, Left 43 blocks Goal status: INITIAL   LONG TERM GOALS: Target date: 10/10/2023    Pt will verbalize understanding of ways to prevent future PD related complications and PD community resources.  Baseline: not yet initiated Goal status: INITIAL  2.   Pt will verbalize understanding of ways to keep thinking skills sharp and ways to compensate for STM changes in the future.  Baseline: not yet initiated Goal status:  INITIAL  3.  Pt will demonstrate improved ease with feeding as evidenced by decreasing PPT#2 by 2-4 secs.  Baseline: 15.1 sec Goal status: INITIAL  4.  Pt will demonstrate increased ease with dressing as evidenced by decreasing PPT#4 (don/ doff jacket) by 2-4 secs or more.  Baseline: 16.09 sec 08/11/2023: 17  seconds with personal jacket Goal status: INITIAL  5. Pt will demonstrate improved ease with fastening buttons as evidenced by decreasing 3 button/unbutton time by 2-5 seconds  Baseline: 42.68 sec seconds Goal status: INITIAL  ASSESSMENT:  CLINICAL IMPRESSION: Pt demonstrates good understanding of PD specific strategies as needed to progress towards goals. Pt responds well to boosts with PWR! moves. He will require further practice for proper carryover.   PERFORMANCE DEFICITS: in functional skills including ADLs, IADLs, coordination, dexterity, tone, ROM, strength, pain, flexibility, Fine motor control, Gross motor control, balance, endurance, decreased knowledge of precautions, decreased knowledge of use of DME, and UE functional use, cognitive skills including safety awareness, and psychosocial skills including coping strategies and environmental adaptation.   IMPAIRMENTS: are limiting patient from ADLs, IADLs, and leisure.   COMORBIDITIES:  may have co-morbidities  that affects occupational performance. Patient will benefit from skilled OT to address above impairments and improve overall function.  REHAB POTENTIAL: Excellent  PLAN:  OT FREQUENCY: 2x/week  OT DURATION: 10 weeks  PLANNED INTERVENTIONS: 97535 self care/ADL training, 16109 therapeutic exercise, 97530 therapeutic activity, 97112 neuromuscular re-education, functional mobility training, psychosocial skills training, energy conservation, coping strategies training, patient/family education, and DME and/or AE instructions  RECOMMENDED OTHER SERVICES: Pt received PT and ST evaluations already and appts in place  for all 3 services  CONSULTED AND AGREED WITH PLAN OF CARE: Patient and family member/caregiver  PLAN FOR NEXT SESSION:  Continue education on tremor strategies starting with the writing section (after eating) Hula hoop; Review cape method for jacket if he brings review PWR! Sitting as needed   Delana Meyer, OT 08/13/2023, 1:40 PM

## 2023-08-13 NOTE — Therapy (Signed)
OUTPATIENT SPEECH LANGUAGE PATHOLOGY PARKINSON'S TREATMENT   Patient Name: Joseph Lamb MRN: [id number] DOB:10-Jul-1951, 72 y.o., male Today's Date: 08/13/2023  PCP: Lupita Raider MD REFERRING PROVIDER: Vladimir Faster, DO  END OF SESSION:  End of Session - 08/13/23 1026     Visit Number 8    Number of Visits 17    Date for SLP Re-Evaluation 09/03/23    SLP Start Time 1015    SLP Stop Time  1100    SLP Time Calculation (min) 45 min    Activity Tolerance Patient tolerated treatment well              Past Medical History:  Diagnosis Date   Adverse effect of antihyperlipidemic and antiarteriosclerotic drugs, initial encounter 10/11/2021   Arrhythmia    Atrial fibrillation 10/11/2021   Cerebrovascular disease    moderate per 2021 MRI   Cholecystitis 09/01/2021   Chronic kidney disease, stage 3 unspecified 10/11/2021   Dizzy spells 03/08/2020   Erectile dysfunction 10/11/2021   Essential hypertension 06/14/2015   Glaucoma 08/31/2021   Hyperlipidemia 06/14/2015   Mild cognitive impairment of uncertain or unknown etiology 05/21/2023   Multiple lacunar infarcts    bilateral centrum semiovale and right parietal cortex.   Obesity 10/11/2021   Obstructive sleep apnea    no CPAP   Primary open angle glaucoma (POAG) of left eye, moderate stage 10/11/2021   Primary open-angle glaucoma, right eye, mild stage 10/11/2021   Renal insufficiency 09/06/2022   Sinus bradycardia 03/08/2020   Tremor 10/11/2021   Type II diabetes mellitus 10/11/2021   Past Surgical History:  Procedure Laterality Date   CHOLECYSTECTOMY N/A 08/31/2021   Procedure: LAPAROSCOPIC CHOLECYSTECTOMY;  Surgeon: Stechschulte, Hyman Hopes, MD;  Location: [facility] OR;  Service: General;  Laterality: N/A;   EYE SURGERY     Glaucoma surgery   PROSTATE SURGERY     Patient Active Problem List   Diagnosis Date Noted   Mild cognitive impairment of uncertain or unknown etiology 05/21/2023   Obstructive sleep apnea     Multiple lacunar infarcts    Cerebrovascular disease    Renal insufficiency 09/06/2022   Atrial fibrillation 10/11/2021   Chronic kidney disease, stage 3 unspecified 10/11/2021   Obesity 10/11/2021   Erectile dysfunction 10/11/2021   Primary open angle glaucoma (POAG) of left eye, moderate stage 10/11/2021   Primary open-angle glaucoma, right eye, mild stage 10/11/2021   Tremor 10/11/2021   Type II diabetes mellitus 10/11/2021   Cholecystitis 09/01/2021   Glaucoma 08/31/2021   Arrhythmia    Dizzy spells 03/08/2020   Sinus bradycardia 03/08/2020   Essential hypertension 06/14/2015   Hyperlipidemia 06/14/2015    ONSET DATE: 06/19/2023(referral date)  REFERRING DIAG:  Diagnosis  G20.A1 (ICD-10-CM) - Parkinson's disease without dyskinesia or fluctuating manifestations (HCC)    THERAPY DIAG:  Dysarthria and anarthria  Rationale for Evaluation and Treatment: Rehabilitation  SUBJECTIVE:   SUBJECTIVE STATEMENT: 'I have been doing them twice a day. I'm on Lesson 9" Pt accompanied by: significant other  PERTINENT HISTORY: From Dr. Don Perking consult: [name] was seen in the movement disorders clinic for neurologic consultation at the request of Rosann Auerbach, PhD.  The consultation is for the evaluation of tremor and Parkinson's.  Pt with wife who supplements hx.  Patient has been seen in our clinic for a long time.  He was first seen by Dr. Karel Jarvis in 2016 and Dr. Marjory Lies prior to that.  Initial evaluation for his further evaluation of memory change,  but at that point in time his MoCA was 29/30.  He came back for further evaluation in 2021.  In the June, 2021 evaluation, it is noted that patient had begun to have hand tremors for 5 months prior.  At that point in time, Dr. Karel Jarvis did not see much on the examination, however.  By August, 2022, Dr. Karel Jarvis did note occasional right thumb resting tremor and mild cogwheeling in the office.  The following visits were with Marlowe Kays.   With time, she began to note right greater than left tremors at rest, mouth tremor, sialorrhea and increased tone.  He was started on levodopa by her in February, 2024.  He apparently discontinued it due to the fact that he felt his heart was fluttering and he d/c the medication.  .he thinks that he took it for a months.  He states that mostly he noted no difference in tremor so he d/c it.  he saw Dr. Milbert Coulter for memory testing in August, 2024, demonstrating mild cognitive impairment.  PAIN:  Are you having pain? No  FALLS: Has patient fallen in last 6 months?  See PT evaluation for details  LIVING ENVIRONMENT: Lives with: lives with their spouse and lives with their son Lives in: House/apartment  PLOF:  Level of assistance: Independent with ADLs, Independent with IADLs Employment: Retired  PATIENT GOALS: "To talk better"  OBJECTIVE:   TODAY'S TREATMENT:                                                                                                                                         DATE:   08/13/23: [name] enters with WNL, he filled out exercise for today prior to completing HEP twice. Instructed him to not fill out the log until AFTER he completes the lesson. Speak out! Lesson 15 completed with supervisions cues to complete each exercise accurately and hits dB targets. In simple conversation, Taryll required occasional min verbal cues and gestures to average 70-72dB over 12 minutes.    11/4/24[name] enters with WNL volume. He reports consistent completion of Speak Out! Twice daily and is on Lesson 13. He has used daily HEP log to track his twice daily progress. He completed Speak Out! Lesson 13 hitting all dB targets and completing each section accurately. He self corrected when he spoke in between exercises without intent. We role played placing orders at [employer] and giving/spelling his name, birth date,  address and phone numbers at the doctor's office. Zarius  maintained 72+dB in these with supervision cues. In  15 minute conversation re: controversial political issues, Kory maintained 70-73dB with occasional min visual and gesture cues. He is to complete 1 on line session by next Monday 08/18/23.   08/06/23: [name] did not complete SpeakOut! Yesterday. He said he took the day off. I generated a log to track his twice daily and the lesson he  has just completed. He completed Speak Out! Lesson 9 hitting all dB targets with supervision cues. In conversation over 15 minutes with prompts, then spontaneous, Kevontay averaged 70dB with rare min A.   0/28/24: [name] reports consistently completing each lesson in Speak Out! Workbook twice a day. He enters knowing that he is on Lesson 9. Today we used an Theatre stage manager from Speak Out! eLibrary (November week 1) [name] required occasional min verbal and visual cues to hold Ah for 10 seconds. He hit all dB targets in the lesson with rare min A (occasional min A for conversation exercises - divergent naming) In simple conversation Hameed required occasional min verbal and gesture cues to maintain 72dB - volume decreases and cognitive load increases - Jonaven verbalizes awareness of slow processing. Education provided for ways family members can assist him in participating in conversations with small groups of family. He told his other son about his PD - they endorsed that he will watch the "what is Parkinson's" webinar on speak out! Website.   10/23/24[name] reports completing Speak Out! 1x - continue to require him to complete this twice a day. He self correct intent voice 3x with supervision cues in initial conversation.  Targeted volume and intelligibility using Speak Out! Lesson 6. Pt required rare min  verbal cues, modeling, for volume and breath support. Pt averages the following volume levels:  Sustained AH: 93 dB  Counting:87  Reading (phrases): 87dB  Cognitive Exercise: 75dB Required usual mod verbal cues, modeling for  carryover of intent /volume completing and explaining idioms   07/28/23: Taryll reports completing Speak Out! Through lesson 5, however he did not complete each lesson twice. I reviewed requirement to complete each lesson 2x, twice a day. We completed Lesson 5 today.  Targeted volume and intelligibility using Speak Out! Lesson 5. Pt required occasional min  verbal cues, modeling, for volume and breath support.  Pt averages the following volume levels:  Sustained AH: 90dB  Counting:84dB  Reading (phrases): 80dB  Cognitive Exercise: 78dB Required usual min to mod verbal cues, modeling for carryover of intent /volume on divergent naming task. Usual min semantic cues to generate 5-8 items in simple categories. In simple description re: holiday traditions, Sulaiman averaged 72dB with usual mod modeling, verbal and gesture cues.    07/23/23: Kaiden brings Speak Out! Workbook to the session. He and [name] have watched the Learn About Parkinson's webinar on [employer]. Instructed them to have their son watch it as well. Braxxton endorses "I have been speaking low for a long time, I didn't realize it"  Previewed the workbook with them, introduced requirement of twice daily practice, advancing lessons after 2 sessions. Demonstrated online Speak Out! Sessions. Targeted volume and intelligibility using Speak Out! Lesson 1. Pt required usual to  occasional min verbal cues and modeling, for volume and breath support.  Pt averages the following volume levels  Sustained AH: 85dB  Counting:85dB  Reading (phrases): 75dB  Cognitive Exercise: 72dB Required usual mod verbal cues, modeling for carryover of intent /volume generating 3 sentence descriptions of basic objects.  following structured practice. In simple conversation over 5 minutes, Cadden averaged 70dB with usual mod visual and verbal cues.    07/09/23 (eval day): Introduced [employer]! Workbook to [employer]. Ordered work book -  Introduced them to [employer], demonstrated location of Learn about [employer] and on Physiological scientist. Instructed them to watch webinar and an online practice session and have their 2  sons watch the webinar as well. Educated re: Automatic vs Intentional motor systems. [name] Id'd when he was speaking or reading with intent vs not using intent. By the end of the session, he was conversing with intent to average 70dB with occasional min a  PATIENT EDUCATION: Education details: See Today's Treatment; See patient instructions; HEP for dysarthria; PD education Person educated: Patient and Spouse Education method: Explanation, Demonstration, Verbal cues, and Handouts Education comprehension: verbalized understanding, returned demonstration, verbal cues required, and needs further education  HOME EXERCISE PROGRAM: Speak Out! Workbook ordered - they are to bring it to ST when it arrives   GOALS: Goals reviewed with patient? Yes  SHORT TERM GOALS: Target date: 08/06/23  Pt will complete HEP hitting target dB's with occasional min A over 1 week Baseline: Goal status: MET  2.  Pt will complete HEP twice a day, completing at least 1 on line session with Parkinson [employer] website Baseline:  Goal status: MET  3.  Pt will averge 72dB 18/20 sentences with rare min A Baseline:  Goal status: MET  4.  Pt will average 72dB over 12 minute converation with rare min A Baseline:  Goal status: MET  5.  Pt and his family will watch Learn about Parkinson's webinar on [employer] website Baseline:  Goal status: MET              LONG TERM GOALS: Target date: 09/03/23  Pt will complete HEP for dysarthria twice daily accurately over 1 week with mod I Baseline:  Goal status:MET   2.  Pt will complete 2 on line practice sessions from [employer] online Baseline:  Goal status: ONGOING  3.  Pt will maintain 70dB over 15 minute conversation with mod  I Baseline:  Goal status: ONGOING  4.  Pt will improve score on Communicative Effectiveness Survey by 3 points Baseline: 19 - spouse rated due to reduced pt awareness Goal status: ONGOING  5.  Pt/spouse will verbalize requirement to continue Speak Out! Daily practice with workbook and online up d/c of ST Baseline:  Goal status: ONGOING   ASSESSMENT:  CLINICAL IMPRESSION: Patient is a 72 y.o. 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 He is accompanied by his spouse, Teryl Lucy. Grayling verbalizes reduced awareness of low volume, stating his spouse needs a hearing aid. He does endorse he is "a little low" at times. [name] reports frequent requests for repetition and difficulty hearing Clete. They deny difficulty swallowing, coughing or getting choked or strangled. Meds are swallowed without difficulty. They report some memory issues, such as walking into a room and forgetting why, however they both feel that Daylyn' current cognitive function is acceptable for day to day activities. Taveon' volume improved significantly after initial training for speak with intent and he demonstrated carry over during simple short conversation at the end of the evaluation. I recommend skilled ST to maximize intelligibility for independence, QOL, reduce caregiver burden.   OBJECTIVE IMPAIRMENTS: Objective impairments include memory and dysarthria. These impairments are limiting patient from effectively communicating at home and in community.Factors affecting potential to achieve goals and functional outcome are medical prognosis.. Patient will benefit from skilled SLP services to address above impairments and improve overall function.  REHAB POTENTIAL: Good  PLAN:  SLP FREQUENCY: 2x/week  SLP DURATION: 8 weeks  PLANNED INTERVENTIONS: Aspiration precaution training, Diet toleration management , Environmental controls, Cueing hierachy, Cognitive reorganization, Internal/external aids,  Functional tasks, Multimodal communication approach, and SLP  instruction and feedback MBSS    Geri Hepler, Radene Journey, CCC-SLP 08/13/2023, 3:52 PM

## 2023-08-18 ENCOUNTER — Encounter: Payer: Self-pay | Admitting: Physical Therapy

## 2023-08-18 ENCOUNTER — Encounter: Payer: Self-pay | Admitting: Occupational Therapy

## 2023-08-18 ENCOUNTER — Ambulatory Visit: Payer: Medicare Other | Admitting: Speech Pathology

## 2023-08-18 ENCOUNTER — Encounter: Payer: Self-pay | Admitting: Speech Pathology

## 2023-08-18 ENCOUNTER — Ambulatory Visit: Payer: Medicare Other | Admitting: Occupational Therapy

## 2023-08-18 ENCOUNTER — Ambulatory Visit: Payer: Medicare Other | Admitting: Physical Therapy

## 2023-08-18 VITALS — BP 111/63 | HR 72

## 2023-08-18 DIAGNOSIS — R278 Other lack of coordination: Secondary | ICD-10-CM

## 2023-08-18 DIAGNOSIS — R471 Dysarthria and anarthria: Secondary | ICD-10-CM | POA: Diagnosis not present

## 2023-08-18 DIAGNOSIS — R2681 Unsteadiness on feet: Secondary | ICD-10-CM

## 2023-08-18 DIAGNOSIS — R29898 Other symptoms and signs involving the musculoskeletal system: Secondary | ICD-10-CM

## 2023-08-18 DIAGNOSIS — R29818 Other symptoms and signs involving the nervous system: Secondary | ICD-10-CM

## 2023-08-18 DIAGNOSIS — M6281 Muscle weakness (generalized): Secondary | ICD-10-CM

## 2023-08-18 NOTE — Therapy (Signed)
OUTPATIENT SPEECH LANGUAGE PATHOLOGY PARKINSON'S TREATMENT   Patient Name: Joseph Lamb MRN: [id number] DOB:08-19-1951, 72 y.o., male Today's Date: 08/18/2023  PCP: Lupita Raider MD REFERRING PROVIDER: Vladimir Faster, DO  END OF SESSION:  End of Session - 08/18/23 1104     Visit Number 9    Number of Visits 17    Date for SLP Re-Evaluation 09/03/23    SLP Start Time 1100    SLP Stop Time  1145    SLP Time Calculation (min) 45 min    Activity Tolerance Patient tolerated treatment well              Past Medical History:  Diagnosis Date   Adverse effect of antihyperlipidemic and antiarteriosclerotic drugs, initial encounter 10/11/2021   Arrhythmia    Atrial fibrillation 10/11/2021   Cerebrovascular disease    moderate per 2021 MRI   Cholecystitis 09/01/2021   Chronic kidney disease, stage 3 unspecified 10/11/2021   Dizzy spells 03/08/2020   Erectile dysfunction 10/11/2021   Essential hypertension 06/14/2015   Glaucoma 08/31/2021   Hyperlipidemia 06/14/2015   Mild cognitive impairment of uncertain or unknown etiology 05/21/2023   Multiple lacunar infarcts    bilateral centrum semiovale and right parietal cortex.   Obesity 10/11/2021   Obstructive sleep apnea    no CPAP   Primary open angle glaucoma (POAG) of left eye, moderate stage 10/11/2021   Primary open-angle glaucoma, right eye, mild stage 10/11/2021   Renal insufficiency 09/06/2022   Sinus bradycardia 03/08/2020   Tremor 10/11/2021   Type II diabetes mellitus 10/11/2021   Past Surgical History:  Procedure Laterality Date   CHOLECYSTECTOMY N/A 08/31/2021   Procedure: LAPAROSCOPIC CHOLECYSTECTOMY;  Surgeon: Stechschulte, Hyman Hopes, MD;  Location: [facility] OR;  Service: General;  Laterality: N/A;   EYE SURGERY     Glaucoma surgery   PROSTATE SURGERY     Patient Active Problem List   Diagnosis Date Noted   Mild cognitive impairment of uncertain or unknown etiology 05/21/2023   Obstructive sleep apnea     Multiple lacunar infarcts    Cerebrovascular disease    Renal insufficiency 09/06/2022   Atrial fibrillation 10/11/2021   Chronic kidney disease, stage 3 unspecified 10/11/2021   Obesity 10/11/2021   Erectile dysfunction 10/11/2021   Primary open angle glaucoma (POAG) of left eye, moderate stage 10/11/2021   Primary open-angle glaucoma, right eye, mild stage 10/11/2021   Tremor 10/11/2021   Type II diabetes mellitus 10/11/2021   Cholecystitis 09/01/2021   Glaucoma 08/31/2021   Arrhythmia    Dizzy spells 03/08/2020   Sinus bradycardia 03/08/2020   Essential hypertension 06/14/2015   Hyperlipidemia 06/14/2015    ONSET DATE: 06/19/2023(referral date)  REFERRING DIAG:  Diagnosis  G20.A1 (ICD-10-CM) - Parkinson's disease without dyskinesia or fluctuating manifestations (HCC)    THERAPY DIAG:  Dysarthria and anarthria  Rationale for Evaluation and Treatment: Rehabilitation  SUBJECTIVE:   SUBJECTIVE STATEMENT: 'I did the one on line for November 4" Pt accompanied by: significant other  PERTINENT HISTORY: From Dr. Don Perking consult: [name] was seen in the movement disorders clinic for neurologic consultation at the request of Rosann Auerbach, PhD.  The consultation is for the evaluation of tremor and Parkinson's.  Pt with wife who supplements hx.  Patient has been seen in our clinic for a long time.  He was first seen by Dr. Karel Jarvis in 2016 and Dr. Marjory Lies prior to that.  Initial evaluation for his further evaluation of memory change, but at that  point in time his MoCA was 29/30.  He came back for further evaluation in 2021.  In the June, 2021 evaluation, it is noted that patient had begun to have hand tremors for 5 months prior.  At that point in time, Dr. Karel Jarvis did not see much on the examination, however.  By August, 2022, Dr. Karel Jarvis did note occasional right thumb resting tremor and mild cogwheeling in the office.  The following visits were with Marlowe Kays.  With time,  she began to note right greater than left tremors at rest, mouth tremor, sialorrhea and increased tone.  He was started on levodopa by her in February, 2024.  He apparently discontinued it due to the fact that he felt his heart was fluttering and he d/c the medication.  .he thinks that he took it for a months.  He states that mostly he noted no difference in tremor so he d/c it.  he saw Dr. Milbert Coulter for memory testing in August, 2024, demonstrating mild cognitive impairment.  PAIN:  Are you having pain? No  FALLS: Has patient fallen in last 6 months?  See PT evaluation for details  LIVING ENVIRONMENT: Lives with: lives with their spouse and lives with their son Lives in: House/apartment  PLOF:  Level of assistance: Independent with ADLs, Independent with IADLs Employment: Retired  PATIENT GOALS: "To talk better"  OBJECTIVE:   TODAY'S TREATMENT:                                                                                                                                         DATE:   08/18/23: Kalex has not filled out HEP log nor completed HEP - he is on Lesson 16 on Speak out! Reviewed requirement to complete lessons twice daily while receiving ST then 1x a day after d/c. Spouse aware to check his HEP log. I requested that his son attend next session to provide another support person to help complete HEP. Speak Out! Lesson 16 with supervision cues to complete accurately and with specified dB level. He did complete an on line session 1x yesterday. In conversation Korban required occasional min A to maintain 70dB. Ongoing encouragement to complete HEP more consistently  08/13/23: [name] enters with WNL, he filled out exercise for today prior to completing HEP twice. Instructed him to not fill out the log until AFTER he completes the lesson. Speak out! Lesson 15 completed with supervisions cues to complete each exercise accurately and hits dB targets. In simple conversation, [place] required  occasional min verbal cues and gestures to average 70-72dB over 12 minutes.    11/4/24[name] enters with WNL volume. He reports consistent completion of Speak Out! Twice daily and is on Lesson 13. He has used daily HEP log to track his twice daily progress. He completed Speak Out! Lesson 13 hitting all dB targets and completing each section accurately. He self corrected when he  spoke in between exercises without intent. We role played placing orders at [employer] and giving/spelling his name, birth date,  address and phone numbers at the doctor's office. [place]lay maintained 72+dB in these with supervision cues. In  15 minute conversation re: controversial political issues, Aesop maintained 70-73dB with occasional min visual and gesture cues. He is to complete 1 on line session by next Monday 08/18/23.   08/06/23: [name] did not complete SpeakOut! Yesterday. He said he took the day off. I generated a log to track his twice daily and the lesson he has just completed. He completed Speak Out! Lesson 9 hitting all dB targets with supervision cues. In conversation over 15 minutes with prompts, then spontaneous, Sevyn averaged 70dB with rare min A.   0/28/24: [name] reports consistently completing each lesson in Speak Out! Workbook twice a day. He enters knowing that he is on Lesson 9. Today we used an Theatre stage manager from Speak Out! eLibrary (November week 1) [name] required occasional min verbal and visual cues to hold Ah for 10 seconds. He hit all dB targets in the lesson with rare min A (occasional min A for conversation exercises - divergent naming) In simple conversation Amon required occasional min verbal and gesture cues to maintain 72dB - volume decreases and cognitive load increases - Kelii verbalizes awareness of slow processing. Education provided for ways family members can assist him in participating in conversations with small groups of family. He told his other son about his PD - they  endorsed that he will watch the "what is Parkinson's" webinar on speak out! Website.   10/23/24[name] reports completing Speak Out! 1x - continue to require him to complete this twice a day. He self correct intent voice 3x with supervision cues in initial conversation.  Targeted volume and intelligibility using Speak Out! Lesson 6. Pt required rare min  verbal cues, modeling, for volume and breath support. Pt averages the following volume levels:  Sustained AH: 93 dB  Counting:87  Reading (phrases): 87dB  Cognitive Exercise: 75dB Required usual mod verbal cues, modeling for carryover of intent /volume completing and explaining idioms   07/28/23: Radarius reports completing Speak Out! Through lesson 5, however he did not complete each lesson twice. I reviewed requirement to complete each lesson 2x, twice a day. We completed Lesson 5 today.  Targeted volume and intelligibility using Speak Out! Lesson 5. Pt required occasional min  verbal cues, modeling, for volume and breath support.  Pt averages the following volume levels:  Sustained AH: 90dB  Counting:84dB  Reading (phrases): 80dB  Cognitive Exercise: 78dB Required usual min to mod verbal cues, modeling for carryover of intent /volume on divergent naming task. Usual min semantic cues to generate 5-8 items in simple categories. In simple description re: holiday traditions, Kameryn averaged 72dB with usual mod modeling, verbal and gesture cues.    07/23/23: Gaelan brings Speak Out! Workbook to the session. He and [name] have watched the Learn About Parkinson's webinar on [employer]. Instructed them to have their son watch it as well. Carrington endorses "I have been speaking low for a long time, I didn't realize it"  Previewed the workbook with them, introduced requirement of twice daily practice, advancing lessons after 2 sessions. Demonstrated online Speak Out! Sessions. Targeted volume and intelligibility using Speak Out! Lesson  1. Pt required usual to  occasional min verbal cues and modeling, for volume and breath support.  Pt averages the following volume levels  Sustained AH:  85dB  Counting:85dB  Reading (phrases): 75dB  Cognitive Exercise: 72dB Required usual mod verbal cues, modeling for carryover of intent /volume generating 3 sentence descriptions of basic objects.  following structured practice. In simple conversation over 5 minutes, Kolbi averaged 70dB with usual mod visual and verbal cues.    07/09/23 (eval day): Introduced [employer]! Workbook to [employer]. Ordered work book - Introduced them to [employer], demonstrated location of Learn about [employer] and on Physiological scientist. Instructed them to watch webinar and an online practice session and have their 2 sons watch the webinar as well. Educated re: Automatic vs Intentional motor systems. [name] Id'd when he was speaking or reading with intent vs not using intent. By the end of the session, he was conversing with intent to average 70dB with occasional min a  PATIENT EDUCATION: Education details: See Today's Treatment; See patient instructions; HEP for dysarthria; PD education Person educated: Patient and Spouse Education method: Explanation, Demonstration, Verbal cues, and Handouts Education comprehension: verbalized understanding, returned demonstration, verbal cues required, and needs further education  HOME EXERCISE PROGRAM: Speak Out! Workbook ordered - they are to bring it to ST when it arrives   GOALS: Goals reviewed with patient? Yes  SHORT TERM GOALS: Target date: 08/06/23  Pt will complete HEP hitting target dB's with occasional min A over 1 week Baseline: Goal status: MET  2.  Pt will complete HEP twice a day, completing at least 1 on line session with Parkinson [employer] website Baseline:  Goal status: MET  3.  Pt will averge 72dB 18/20 sentences with rare min A Baseline:  Goal status: MET  4.   Pt will average 72dB over 12 minute converation with rare min A Baseline:  Goal status: MET  5.  Pt and his family will watch Learn about Parkinson's webinar on [employer] website Baseline:  Goal status: MET              LONG TERM GOALS: Target date: 09/03/23  Pt will complete HEP for dysarthria twice daily accurately over 1 week with mod I Baseline:  Goal status:MET   2.  Pt will complete 2 on line practice sessions from [employer] online Baseline:  Goal status: ONGOING  3.  Pt will maintain 70dB over 15 minute conversation with mod I Baseline:  Goal status: ONGOING  4.  Pt will improve score on Communicative Effectiveness Survey by 3 points Baseline: 19 - spouse rated due to reduced pt awareness Goal status: ONGOING  5.  Pt/spouse will verbalize requirement to continue Speak Out! Daily practice with workbook and online up d/c of ST Baseline:  Goal status: ONGOING   ASSESSMENT:  CLINICAL IMPRESSION: Patient is a 72 y.o. 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 He is accompanied by his spouse, Teryl Lucy. Imtiaz is completing HEP Speak Out! Program accurately, however he is not completing daily lessons. I provided a log which has not been filled out. Spouse instructed to A Effie with remembering his daily practice and log. He continues to require occasional min to mod A to maintain volume in conversation and with mild cognitive load. [name] does report overall improved intelligibility at home.  I recommend skilled ST to maximize intelligibility for independence, QOL, reduce caregiver burden.   OBJECTIVE IMPAIRMENTS: Objective impairments include memory and dysarthria. These impairments are limiting patient from effectively communicating at home and in community.Factors affecting potential to achieve goals and functional outcome  are medical prognosis.. Patient will benefit from skilled SLP services to address above impairments and  improve overall function.  REHAB POTENTIAL: Good  PLAN:  SLP FREQUENCY: 2x/week  SLP DURATION: 8 weeks  PLANNED INTERVENTIONS: Aspiration precaution training, Diet toleration management , Environmental controls, Cueing hierachy, Cognitive reorganization, Internal/external aids, Functional tasks, Multimodal communication approach, and SLP instruction and feedback MBSS    Maleyah Evans, Radene Journey, CCC-SLP 08/18/2023, 11:59 AM

## 2023-08-18 NOTE — Patient Instructions (Signed)
   When Joseph get instructions or requests, repeat back what Joseph have heard and what Joseph are supposed to do  Stick up for your self when Joseph need some extra time to process  Complete another (at least 1) on line session  Complete lessons twice daily - log them after Joseph complete them

## 2023-08-18 NOTE — Therapy (Signed)
OUTPATIENT OCCUPATIONAL THERAPY PARKINSON'S TREATMENT  Patient Name: KYSTON TOMITA MRN: [id number] DOB:1951/03/14, 72 y.o., male Today's Date: 08/18/2023  PCP: Lupita Raider, MD REFERRING PROVIDER: Vladimir Faster, DO  END OF SESSION:  OT End of Session - 08/18/23 0931     Visit Number 6    Number of Visits 20    Date for OT Re-Evaluation 10/03/23    Authorization Type [employer] Medicare 2024 10% coinsur NO Ded VL: [place] Auth Not Reqd    Progress Note Due on Visit 10    OT Start Time 0930    OT Stop Time 1015    OT Time Calculation (min) 45 min    Activity Tolerance Patient tolerated treatment well    Behavior During Therapy [facility] for tasks assessed/performed             Past Medical History:  Diagnosis Date   Adverse effect of antihyperlipidemic and antiarteriosclerotic drugs, initial encounter 10/11/2021   Arrhythmia    Atrial fibrillation 10/11/2021   Cerebrovascular disease    moderate per 2021 MRI   Cholecystitis 09/01/2021   Chronic kidney disease, stage 3 unspecified 10/11/2021   Dizzy spells 03/08/2020   Erectile dysfunction 10/11/2021   Essential hypertension 06/14/2015   Glaucoma 08/31/2021   Hyperlipidemia 06/14/2015   Mild cognitive impairment of uncertain or unknown etiology 05/21/2023   Multiple lacunar infarcts    bilateral centrum semiovale and right parietal cortex.   Obesity 10/11/2021   Obstructive sleep apnea    no CPAP   Primary open angle glaucoma (POAG) of left eye, moderate stage 10/11/2021   Primary open-angle glaucoma, right eye, mild stage 10/11/2021   Renal insufficiency 09/06/2022   Sinus bradycardia 03/08/2020   Tremor 10/11/2021   Type II diabetes mellitus 10/11/2021   Past Surgical History:  Procedure Laterality Date   CHOLECYSTECTOMY N/A 08/31/2021   Procedure: LAPAROSCOPIC CHOLECYSTECTOMY;  Surgeon: Stechschulte, Hyman Hopes, MD;  Location: [facility] OR;  Service: General;  Laterality: N/A;   EYE SURGERY     Glaucoma surgery   PROSTATE  SURGERY     Patient Active Problem List   Diagnosis Date Noted   Mild cognitive impairment of uncertain or unknown etiology 05/21/2023   Obstructive sleep apnea    Multiple lacunar infarcts    Cerebrovascular disease    Renal insufficiency 09/06/2022   Atrial fibrillation 10/11/2021   Chronic kidney disease, stage 3 unspecified 10/11/2021   Obesity 10/11/2021   Erectile dysfunction 10/11/2021   Primary open angle glaucoma (POAG) of left eye, moderate stage 10/11/2021   Primary open-angle glaucoma, right eye, mild stage 10/11/2021   Tremor 10/11/2021   Type II diabetes mellitus 10/11/2021   Cholecystitis 09/01/2021   Glaucoma 08/31/2021   Arrhythmia    Dizzy spells 03/08/2020   Sinus bradycardia 03/08/2020   Essential hypertension 06/14/2015   Hyperlipidemia 06/14/2015    ONSET DATE: 06/19/2023  REFERRING DIAG: G20.A1 (ICD-10-CM) - Parkinson's disease without dyskinesia or fluctuating manifestations  THERAPY DIAG:  Other symptoms and signs involving the nervous system  Muscle weakness (generalized)  Unsteadiness on feet  Other lack of coordination  Other symptoms and signs involving the musculoskeletal system  Rationale for Evaluation and Treatment: Rehabilitation  SUBJECTIVE:   SUBJECTIVE STATEMENT: Pt reports stretch with PWR! Twist.   Pt accompanied by: self   PERTINENT HISTORY:  PMH: PD (newly diagnosed), A fb, stage 3 CKD, HLD, HTN, multiple lacunar infarcts, obesity, type II diabetes  Re: Dr. Arbutus Leas Neuro appt: 06/19/2023 Consultation is for the evaluation  of tremor and Parkinson's.  He was first seen by Dr. Karel Jarvis in 2016 and Dr. Marjory Lies prior to that.  Initial evaluation for his further evaluation of memory change, but at that point in time his MoCA was 29/30.  He came back for further evaluation in 2021.  In the June, 2021, it was noted that patient had began to have hand tremors for 5 months prior.  At that point in time, Dr. Karel Jarvis did not see much on  the examination, however.  By August, 2022, Dr. Karel Jarvis did note occasional right thumb resting tremor and mild cogwheeling in the office.  The following visits were with Marlowe Kays.  With time, she began to note right greater than left tremors at rest, mouth tremor, sialorrhea and increased tone.  He was started on levodopa by her in February, 2024.  He apparently discontinued it due to the fact that he felt his heart was fluttering and he d/c the medication. He states that mostly he noted no difference in tremor so he d/c it.  He saw Dr. Milbert Coulter for memory testing in August, 2024, demonstrating mild cognitive impairment.  Dr. Milbert Coulter referred him to Dr. Arbutus Leas for further evaluation.   PRECAUTIONS: None  WEIGHT BEARING RESTRICTIONS: No  PAIN:  Are you having pain? Yes: NPRS scale: 4-5/10 Pain location: R hand and shoulder & abdominal Pain description: gnawing pain Aggravating factors: When I get upset/tired, eat certain foods - hot/spicy ie) hamburgers, BBQ Relieving factors: Stop the shaking in R hand/arm, Tums  FALLS: Has patient fallen in last 6 months? No  LIVING ENVIRONMENT: Lives with: lives with their spouse and lives with their son Lives in: House/apartment Stairs: Yes: External: 3 steps; bilateral but cannot reach both Has following equipment at home: shower chair and Grab bars  PLOF: Independent Sometimes needs help with buttoning & tying laces  PATIENT GOALS: To help with stiffness and dexterity in his hands/arms ie) has/had trouble turning to reach to back seat of the car, trouble with some dressing/buttons and other ADLs compared to PLOF.  OBJECTIVE:  Note: Objective measures were completed at Evaluation unless otherwise noted.  HAND DOMINANCE: Right  ADLs: Overall ADLs: Mod I Transfers/ambulation related to ADLs: Ind without AE at this time Eating: Does it himself, including cutting food. Grooming: "I do an adequate job." Re: shaving (only missing a little bit) UB Dressing:  Wife may help to pull down his shirt in the back LB Dressing: Takes extra time to tie laces Toileting: Mod I Bathing: Decent job. 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 and they were bubble packed but thinks he'll have to  Financial management: Wife is the Management consultant" Handwriting: 50% legible and Mild micrographia - see eval for sample writing  MOBILITY STATUS: Independent  POSTURE COMMENTS:  rounded shoulders and forward head  ACTIVITY TOLERANCE: Activity tolerance: Good  FUNCTIONAL OUTCOME MEASURES: Fastening/unfastening 3 buttons: 42.68 sec Physical performance test: PPT#2 (simulated eating) 15.1 sec & PPT#4 (donning/doffing jacket): 16.09 sec  UEFI = 62/80 (see specific areas in eval)  COORDINATION: 9 Hole Peg test: Right: 36.28  sec; Left:  36.4 sec Box and Blocks:  Right 47 blocks, Left 43 blocks Tremors: Resting and Right  UE ROM:  WFL - slight end range limitations  UE MMT:   WFL Grip Strength: Right 57.5, 71.4, 75.6 Average 68.2 lbs Left 55.9, 70.5, 83.3 Average 69.9 lbs  SENSATION: WFL  MUSCLE TONE: TBA  COGNITION: Overall cognitive status: Within functional limits for tasks assessed - See ST evaluation  OBSERVATIONS: Dystonia  TODAY'S TREATMENT:                                                                                                                                Reviewed and progressed PWR! Sitting moves requiring extensive cueing for proper completion as needed for desired movements. Pt held BoomWhackers and sat against wall to complete PWR! Twist  with cues to look to hands. Agility dots used with PWR! Step for visual boosts. Pt also cued to look at hands for PWR! Rock for visual boosts. Cues to come off back of chair and hands touching wall for PWR! Up.   OT educated pt on tremor reduction strategies with writing prn however tremors appear to be more resting.   Pt practiced use of hula hoop prior to using cape method for donning jacket. Pt practiced x 5 w/ more success  Pt needs extensive review of HEP's/education d/t cognitive deficits  PATIENT EDUCATION: Education details: see above Person educated: Patient Education method: Explanation, Demonstration, and Handouts Education comprehension: verbalized understanding, returned demonstration, and needs further education  HOME EXERCISE PROGRAM: 07/30/2023: PWR! Hands; BIG ADLs 08/06/23: handwriting strategies 08/11/2023: PWR! Sitting; Cape method 08/13/2023: Tremor reduction strategies  GOALS: Goals reviewed with patient? Yes  GOALS:  SHORT TERM GOALS: Target date: 08/27/2023    Pt will be independent with PD specific HEP.  Baseline: not yet initiated Goal status: IN PROGRESS  2.  Pt will verbalize understanding of adapted strategies to maximize safety and independence with ADLs/IADLs.  Baseline: not yet initiated Goal status: IN PROGRESS  3.  Pt will write his name/simple list with no significant decrease in size and maintain 75% legibility.  Baseline: 50% legibility Goal status: IN PROGRESS  4.  Pt will demonstrate improved fine motor coordination for ADLs as evidenced by decreasing 9 hole peg test score for BUE and by 3-5 secs  Baseline: Right: 36.28  sec; Left: 36.4 sec Goal status: INITIAL  5.  Pt will be able to place at least 3-5 additional blocks using B hand with completion of Box and Blocks test.  Baseline: Right 47 blocks, Left 43 blocks Goal status: INITIAL   LONG TERM GOALS: Target date: 10/10/2023    Pt will verbalize understanding of ways to  prevent future PD related complications and PD community resources.  Baseline: not yet initiated Goal status: INITIAL  2.   Pt will verbalize understanding of ways to keep thinking skills sharp and ways to compensate for STM changes in the future.  Baseline: not  yet initiated Goal status: INITIAL  3.  Pt will demonstrate improved ease with feeding as evidenced by decreasing PPT#2 by 2-4 secs.  Baseline: 15.1 sec Goal status: INITIAL  4.  Pt will demonstrate increased ease with dressing as evidenced by decreasing PPT#4 (don/ doff jacket) by 2-4 secs or more.  Baseline: 16.09 sec 08/11/2023: 17 seconds with personal jacket Goal status: IN PROGRESS  5. Pt will demonstrate improved ease with fastening buttons as evidenced by decreasing 3 button/unbutton time by 2-5 seconds  Baseline: 42.68 sec seconds Goal status: INITIAL  ASSESSMENT:  CLINICAL IMPRESSION: Pt demonstrates good understanding of PD specific strategies as needed to progress towards goals. Pt responds well to boosts with PWR! moves. He will require further practice for proper carryover.   PERFORMANCE DEFICITS: in functional skills including ADLs, IADLs, coordination, dexterity, tone, ROM, strength, pain, flexibility, Fine motor control, Gross motor control, balance, endurance, decreased knowledge of precautions, decreased knowledge of use of DME, and UE functional use, cognitive skills including safety awareness, and psychosocial skills including coping strategies and environmental adaptation.   IMPAIRMENTS: are limiting patient from ADLs, IADLs, and leisure.   COMORBIDITIES:  may have co-morbidities  that affects occupational performance. Patient will benefit from skilled OT to address above impairments and improve overall function.  REHAB POTENTIAL: Excellent  PLAN:  OT FREQUENCY: 2x/week  OT DURATION: 10 weeks  PLANNED INTERVENTIONS: 97535 self care/ADL training, 54270 therapeutic exercise, 97530 therapeutic  activity, 97112 neuromuscular re-education, functional mobility training, psychosocial skills training, energy conservation, coping strategies training, patient/family education, and DME and/or AE instructions  RECOMMENDED OTHER SERVICES: Pt received PT and ST evaluations already and appts in place for all 3 services  CONSULTED AND AGREED WITH PLAN OF CARE: Patient and family member/caregiver  PLAN FOR NEXT SESSION:  Practice handwriting, buttons, begin assessing STG's   Sheran Lawless, OT 08/18/2023, 9:32 AM

## 2023-08-18 NOTE — Therapy (Signed)
OUTPATIENT PHYSICAL THERAPY NEURO TREATMENT   Patient Name: Joseph Lamb MRN: [id number] DOB:Sep 18, 1951, 72 y.o., male Today's Date: 08/18/2023   PCP: Lupita Raider, MD  REFERRING PROVIDER:  Vladimir Faster, DO  END OF SESSION:  PT End of Session - 08/18/23 1016     Visit Number 9    Number of Visits 13    Date for PT Re-Evaluation 09/07/23    Authorization Type [employer] Medicare    PT Start Time 1015    PT Stop Time 1100    PT Time Calculation (min) 45 min    Equipment Utilized During Treatment Gait belt    Activity Tolerance Patient tolerated treatment well    Behavior During Therapy WFL for tasks assessed/performed             Past Medical History:  Diagnosis Date   Adverse effect of antihyperlipidemic and antiarteriosclerotic drugs, initial encounter 10/11/2021   Arrhythmia    Atrial fibrillation 10/11/2021   Cerebrovascular disease    moderate per 2021 MRI   Cholecystitis 09/01/2021   Chronic kidney disease, stage 3 unspecified 10/11/2021   Dizzy spells 03/08/2020   Erectile dysfunction 10/11/2021   Essential hypertension 06/14/2015   Glaucoma 08/31/2021   Hyperlipidemia 06/14/2015   Mild cognitive impairment of uncertain or unknown etiology 05/21/2023   Multiple lacunar infarcts    bilateral centrum semiovale and right parietal cortex.   Obesity 10/11/2021   Obstructive sleep apnea    no CPAP   Primary open angle glaucoma (POAG) of left eye, moderate stage 10/11/2021   Primary open-angle glaucoma, right eye, mild stage 10/11/2021   Renal insufficiency 09/06/2022   Sinus bradycardia 03/08/2020   Tremor 10/11/2021   Type II diabetes mellitus 10/11/2021   Past Surgical History:  Procedure Laterality Date   CHOLECYSTECTOMY N/A 08/31/2021   Procedure: LAPAROSCOPIC CHOLECYSTECTOMY;  Surgeon: Stechschulte, Hyman Hopes, MD;  Location: [facility] OR;  Service: General;  Laterality: N/A;   EYE SURGERY     Glaucoma surgery   PROSTATE SURGERY     Patient Active  Problem List   Diagnosis Date Noted   Mild cognitive impairment of uncertain or unknown etiology 05/21/2023   Obstructive sleep apnea    Multiple lacunar infarcts    Cerebrovascular disease    Renal insufficiency 09/06/2022   Atrial fibrillation 10/11/2021   Chronic kidney disease, stage 3 unspecified 10/11/2021   Obesity 10/11/2021   Erectile dysfunction 10/11/2021   Primary open angle glaucoma (POAG) of left eye, moderate stage 10/11/2021   Primary open-angle glaucoma, right eye, mild stage 10/11/2021   Tremor 10/11/2021   Type II diabetes mellitus 10/11/2021   Cholecystitis 09/01/2021   Glaucoma 08/31/2021   Arrhythmia    Dizzy spells 03/08/2020   Sinus bradycardia 03/08/2020   Essential hypertension 06/14/2015   Hyperlipidemia 06/14/2015    ONSET DATE: 06/19/2023  REFERRING DIAG: G20.A1 (ICD-10-CM) - Parkinson's disease without dyskinesia or fluctuating manifestations (HCC)   THERAPY DIAG:  Muscle weakness (generalized)  Unsteadiness on feet  Other lack of coordination  Rationale for Evaluation and Treatment: Rehabilitation  SUBJECTIVE:  SUBJECTIVE STATEMENT:  Patient reports no falls and near falls. Patient reports that he is a little tired from OT appointment. Patient and spouse mention at end of session wanting to work on patient's ease getting in and out of car.  Pt accompanied by:  Spouse, [name]  PERTINENT HISTORY: PMH: PD,  A fb, stage 3 CKD, HLD, HTN, multiple lacunar infarcts, obesity, type II diabetes   Per Dr. Arbutus Leas:  Idiopathic Parkinson's disease, moderate             -Diagnosis was recently made, but likely has had this for a number of years  PAIN:  Are you having pain? No  Vitals:   08/18/23 1019  BP: 111/63  Pulse: 72    PRECAUTIONS: Fall  RED  FLAGS: None   WEIGHT BEARING RESTRICTIONS: No  FALLS: Has patient fallen in last 6 months? No  LIVING ENVIRONMENT: Lives with: lives with their spouse Lives in: House/apartment Stairs: Yes: External: 3 steps; bilateral but cannot reach both Has following equipment at home: None Has trouble getting up from sitting in the bathtub   PLOF: Independent Sometimes needs help with buttoning   PATIENT GOALS: Slow down the progression, maintain as much as possible.   OBJECTIVE:  Note: Objective measures were completed at Evaluation unless otherwise noted.  COGNITION: Overall cognitive status: Within functional limits for tasks assessed and Impaired Neurocognitive testing in February, 2024 with evidence of mild cognitive impairment. This was largely stable compared to prior testing with Dr. Roseanne Reno in 2021.    SENSATION: Light touch: WFL Pt reports sometimes numbness/tingling when getting up in the morning   COORDINATION: Heel to shin: WNL   MUSCLE TONE: Noted more stiffness in LLE    POSTURE: rounded shoulders and forward head  RUE resting tremors   LOWER EXTREMITY ROM:     Noted slightly decr R knee extension AROM   LOWER EXTREMITY MMT:    MMT Right Eval Left Eval  Hip flexion 5 5  Hip extension    Hip abduction    Hip adduction    Hip internal rotation    Hip external rotation    Knee flexion 5 5  Knee extension 5 5  Ankle dorsiflexion 5 5  Ankle plantarflexion    Ankle inversion    Ankle eversion    (Blank rows = not tested)  BED MOBILITY:  Pt reports no difficulty   TRANSFERS: Assistive device utilized: None  Sit to stand: SBA Stand to sit: SBA Performs with no UE support   Pt notes difficulty getting in and out of the car, with bringing the legs in.   Pt reports difficulty getting up off the couch, sometimes will take a couple of tries.    FUNCTIONAL TESTS:  5 times sit to stand: 14 seconds with no UE support  10 meter walk test: 10.91 seconds  = 3.01 ft/sec    TODAY'S TREATMENT:  Therapeutic Exercise:  SciFit with BUE/BLE at [place] setting Gear 6.0 for 8 minutes for neural priming, reciprocal movement patterns, incr amplitude of stepping, activity tolerance. Pt keep spm >90 with all out on final 2 minutes . Pt reporting RPE as a 8-9/10  NMR:  Scarf work for  [employer] raise and lower with opp LE step return for weight shift 2 x 8 Minimal success with sequencing, frequently reverted back to same side toss and step Scarf toss to self with op LE step return for weight shift 1 x 10 bil Increase success with sequence when broken down side to side without alternating Scarf toss to self with op LE step return for weight shift 1 x 5 bil alt Minimal success with sequencing, frequently reverted back to same side toss and step  Ramp Work with CGA: Standing on ramp with large amplitude step backs 1 x 10 bil alt, 1 x 10 bil alt with overhead press with 5lb kettle bell in each arm Forward walking up and backward walking down ramp for weight shift, foot clearance, and balance 1 x 5 reps, 1 x 5 reps with 10lb kettle bell carry RPE: 6/10  Reactive stepping with posterior release  Total of 6 attempts, patient able to self correct with one large step on 4/6 attempts  PATIENT EDUCATION: Education details: Continue HEP + plan to look at car next session Person educated: Patient and Spouse Education method: Explanation, Demonstration, Verbal cues, and Handouts Education comprehension: verbalized understanding, returned demonstration, and needs further education  HOME EXERCISE PROGRAM: Standing PWR moves  Forward/backward stepping  Access Code: [email or web address] URL: [email or web address] Date: 07/30/2023 Prepared by: Sherlie Ban  Exercises - Staggered Stance Weight Shift with Arms Reaching  - 1-2 x daily  - 5 x weekly - 2 sets - 10 reps  GOALS: Goals reviewed with patient? Yes  SHORT TERM GOALS:  Target date: 08/06/2023  Pt will be independent with initial HEP for PD specific deficits in order to build upon functional gains made in therapy. Baseline: Goal status: MET  2.  Pt will perform posterior stepping strategy in 3 steps or less in order to demo improved balance strategies.  Baseline: at least 5 steps, min A for balance   4-5 small steps with min A from therapist for balance (10/30) Goal status: NOT MET  3.  Pt will improve 5x sit<>stand to less than or equal to 12 sec to demonstrate improved functional strength and transfer efficiency.   Baseline: 14 seconds with no UE support  11.3 seconds (10/30) Goal status: MET   LONG TERM GOALS: Target date: 09/03/2023   Pt will be independent with final  HEP for PD specific deficits in order to build upon functional gains made in therapy. Baseline:  Goal status: INITIAL  2.  Pt will improve miniBEST to at least a 22/28 in order to demo decr fall risk Baseline: 17/28 Goal status: INITIAL  3.  Pt will verbalize understanding of local Parkinson's disease resources, including options for continue community fitness.  Baseline:  Goal status: INITIAL  4.  Pt will improve TUG and TUG cog score to less than or equal to 10% difference for improved dual task/decreased fall risk.  Baseline:  Goal status: INITIAL  5.  Pt will get up from lower/softer surfaces to mimic a couch/recliner mod I in order to demo improved transfers  Baseline:  Goal status: INITIAL    ASSESSMENT:  CLINICAL IMPRESSION:  Started session today on the SciFit for larger amplitude  movements with pt able to tolerate an incr in resistance with RPE at 8/10 achieving desired high intensity cardiovascular training. Remainder of session emphasized large amplitude stepping work, reciprocal arm sequencing, and weight shift. Backwards stepping work earlier in the  session resulted in 1-2 step only for posterior reactive stepping corrections. Bilateral alternating sequencing work very challenging for patient and would benefit from reinforcement in future session. Will continue per POC.    OBJECTIVE IMPAIRMENTS: Abnormal gait, decreased activity tolerance, decreased balance, decreased cognition, decreased coordination, decreased mobility, difficulty walking, decreased strength, impaired flexibility, impaired UE functional use, and postural dysfunction.   ACTIVITY LIMITATIONS: carrying, bending, stairs, transfers, dressing, and locomotion level  PARTICIPATION LIMITATIONS: community activity  PERSONAL FACTORS: Age, Behavior pattern, Past/current experiences, Time since onset of injury/illness/exacerbation, and 3+ comorbidities:  PD,  A fb, stage 3 CKD, HLD, HTN, multiple lacunar infarcts, obesity, type II diabetes   are also affecting patient's functional outcome.   REHAB POTENTIAL: Good  CLINICAL DECISION MAKING: Evolving/moderate complexity  EVALUATION COMPLEXITY: Moderate  PLAN:  PT FREQUENCY: 2x/week  PT DURATION: 8 weeks  PLANNED INTERVENTIONS: Therapeutic exercises, Therapeutic activity, Neuromuscular re-education, Balance training, Gait training, Patient/Family education, Self Care, Joint mobilization, Stair training, DME instructions, and Re-evaluation  PLAN FOR NEXT SESSION: work on SLS, stepping strategies, larger amplitude movement patterns . SciFit for reciprocal movement, endurance. Gait exercises with arm swing, Posterior balance, could trial resisted gait outdoors on uneven surface, 10th visit progress note and look at car transfers   Carmelia Bake, PT, DPT 08/18/2023, 12:26 PM

## 2023-08-20 ENCOUNTER — Encounter: Payer: Medicare Other | Admitting: Occupational Therapy

## 2023-08-20 ENCOUNTER — Encounter: Payer: Medicare Other | Admitting: Speech Pathology

## 2023-08-20 ENCOUNTER — Ambulatory Visit: Payer: Medicare Other | Admitting: Physical Therapy

## 2023-08-21 ENCOUNTER — Ambulatory Visit: Payer: Medicare Other | Admitting: Physical Therapy

## 2023-08-21 ENCOUNTER — Encounter: Payer: Medicare Other | Admitting: Occupational Therapy

## 2023-08-21 LAB — LAB REPORT - SCANNED
A1c: 6.6
Creatinine, POC: 82 mg/dL
EGFR: 40
Microalb Creat Ratio: <8.5

## 2023-08-25 ENCOUNTER — Ambulatory Visit: Payer: Medicare Other | Admitting: Physical Therapy

## 2023-08-25 ENCOUNTER — Ambulatory Visit: Payer: Medicare Other | Admitting: Speech Pathology

## 2023-08-25 ENCOUNTER — Other Ambulatory Visit: Payer: Self-pay

## 2023-08-25 ENCOUNTER — Ambulatory Visit: Payer: Medicare Other | Admitting: Occupational Therapy

## 2023-08-25 DIAGNOSIS — E782 Mixed hyperlipidemia: Secondary | ICD-10-CM | POA: Insufficient documentation

## 2023-08-25 DIAGNOSIS — N528 Other male erectile dysfunction: Secondary | ICD-10-CM | POA: Insufficient documentation

## 2023-08-25 DIAGNOSIS — G250 Essential tremor: Secondary | ICD-10-CM | POA: Insufficient documentation

## 2023-08-25 DIAGNOSIS — I499 Cardiac arrhythmia, unspecified: Secondary | ICD-10-CM | POA: Insufficient documentation

## 2023-08-25 DIAGNOSIS — R471 Dysarthria and anarthria: Secondary | ICD-10-CM | POA: Diagnosis not present

## 2023-08-25 DIAGNOSIS — R2681 Unsteadiness on feet: Secondary | ICD-10-CM

## 2023-08-25 DIAGNOSIS — M6281 Muscle weakness (generalized): Secondary | ICD-10-CM

## 2023-08-25 DIAGNOSIS — F0150 Vascular dementia without behavioral disturbance: Secondary | ICD-10-CM | POA: Insufficient documentation

## 2023-08-25 DIAGNOSIS — R278 Other lack of coordination: Secondary | ICD-10-CM

## 2023-08-25 DIAGNOSIS — E1169 Type 2 diabetes mellitus with other specified complication: Secondary | ICD-10-CM | POA: Insufficient documentation

## 2023-08-25 DIAGNOSIS — R29818 Other symptoms and signs involving the nervous system: Secondary | ICD-10-CM

## 2023-08-25 NOTE — Therapy (Signed)
OUTPATIENT PHYSICAL THERAPY NEURO TREATMENT/10th VISIT PN   Patient Name: Joseph Lamb MRN: [id number] DOB:Mar 12, 1951, 72 y.o., male Today's Date: 08/25/2023   PCP: Lupita Raider, MD  REFERRING PROVIDER:  Vladimir Faster, DO  10th Visit Physical Therapy Progress Note  Dates of Reporting Period: 07/09/23 to 08/25/23    END OF SESSION:  PT End of Session - 08/25/23 1020     Visit Number 10    Number of Visits 13    Date for PT Re-Evaluation 09/07/23    Authorization Type [employer]    PT Start Time 1018    PT Stop Time 1058   11 minutes non-billable due to pt in restroom   PT Time Calculation (min) 40 min    Equipment Utilized During Treatment Gait belt    Activity Tolerance Patient tolerated treatment well    Behavior During Therapy [facility] for tasks assessed/performed             Past Medical History:  Diagnosis Date   Arrhythmia    Atrial fibrillation 10/11/2021   Cerebrovascular disease    moderate per 2021 MRI   Cholecystitis 09/01/2021   Chronic kidney disease, stage 3 unspecified 10/11/2021   Dizzy spells 03/08/2020   Erectile dysfunction 10/11/2021   Essential hypertension 06/14/2015   Essential tremor    Glaucoma 08/31/2021   Hyperlipidemia 06/14/2015   Irregular heart beat    Mild cognitive impairment of uncertain or unknown etiology 05/21/2023   Mixed hyperlipidemia    Multiple lacunar infarcts    bilateral centrum semiovale and right parietal cortex.   Obesity 10/11/2021   Obstructive sleep apnea    no CPAP   Other male erectile dysfunction    Primary open angle glaucoma (POAG) of left eye, moderate stage 10/11/2021   Primary open-angle glaucoma, right eye, mild stage 10/11/2021   Renal insufficiency 09/06/2022   Sinus bradycardia 03/08/2020   Tremor 10/11/2021   Type 2 diabetes mellitus with other specified complication (HCC)    Type II diabetes mellitus 10/11/2021   Vascular dementia [facility])    Past Surgical History:  Procedure  Laterality Date   CHOLECYSTECTOMY N/A 08/31/2021   Procedure: LAPAROSCOPIC CHOLECYSTECTOMY;  Surgeon: Quentin Ore, MD;  Location: [facility] OR;  Service: General;  Laterality: N/A;   EYE SURGERY     Glaucoma surgery   PROSTATE SURGERY     Patient Active Problem List   Diagnosis Date Noted   Essential tremor    Mixed hyperlipidemia    Irregular heart beat    Other male erectile dysfunction    Type 2 diabetes mellitus with other specified complication (HCC)    Vascular dementia (HCC)    Mild cognitive impairment of uncertain or unknown etiology 05/21/2023   Obstructive sleep apnea    Multiple lacunar infarcts    Cerebrovascular disease    Renal insufficiency 09/06/2022   Atrial fibrillation 10/11/2021   Chronic kidney disease, stage 3 unspecified 10/11/2021   Obesity 10/11/2021   Erectile dysfunction 10/11/2021   Primary open angle glaucoma (POAG) of left eye, moderate stage 10/11/2021   Primary open-angle glaucoma, right eye, mild stage 10/11/2021   Tremor 10/11/2021   Type II diabetes mellitus 10/11/2021   Cholecystitis 09/01/2021   Glaucoma 08/31/2021   Arrhythmia    Dizzy spells 03/08/2020   Sinus bradycardia 03/08/2020   Essential hypertension 06/14/2015   Hyperlipidemia 06/14/2015    ONSET DATE: 06/19/2023  REFERRING DIAG: G20.A1 (ICD-10-CM) - Parkinson's disease without dyskinesia or fluctuating manifestations (HCC)  THERAPY DIAG:  Muscle weakness (generalized)  Unsteadiness on feet  Other lack of coordination  Other symptoms and signs involving the nervous system  Rationale for Evaluation and Treatment: Rehabilitation  SUBJECTIVE:                                                                                                                                                                                             SUBJECTIVE STATEMENT:  No changes, no falls. Reports it takes a long time to get in and out of the car. Noticing getting out of the bath  tub and the couch is getting easier. Feels like endurance/stamina is getting better.   Pt accompanied by:  Spouse, Joseph Lamb  PERTINENT HISTORY: PMH: PD,  A fb, stage 3 CKD, HLD, HTN, multiple lacunar infarcts, obesity, type II diabetes   Per Dr. Arbutus Leas:  Idiopathic Parkinson's disease, moderate             -Diagnosis was recently made, but likely has had this for a number of years  PAIN:  Are you having pain? No, just some muscle pain from the tremors.   There were no vitals filed for this visit.   PRECAUTIONS: Fall  RED FLAGS: None   WEIGHT BEARING RESTRICTIONS: No  FALLS: Has patient fallen in last 6 months? No  LIVING ENVIRONMENT: Lives with: lives with their spouse Lives in: House/apartment Stairs: Yes: External: 3 steps; bilateral but cannot reach both Has following equipment at home: None Has trouble getting up from sitting in the bathtub   PLOF: Independent Sometimes needs help with buttoning   PATIENT GOALS: Slow down the progression, maintain as much as possible.   OBJECTIVE:  Note: Objective measures were completed at Evaluation unless otherwise noted.  COGNITION: Overall cognitive status: Within functional limits for tasks assessed and Impaired Neurocognitive testing in February, 2024 with evidence of mild cognitive impairment. This was largely stable compared to prior testing with Dr. Roseanne Reno in 2021.    SENSATION: Light touch: WFL Pt reports sometimes numbness/tingling when getting up in the morning   COORDINATION: Heel to shin: WNL   MUSCLE TONE: Noted more stiffness in LLE    POSTURE: rounded shoulders and forward head  RUE resting tremors   LOWER EXTREMITY ROM:     Noted slightly decr R knee extension AROM   LOWER EXTREMITY MMT:    MMT Right Eval Left Eval  Hip flexion 5 5  Hip extension    Hip abduction    Hip adduction    Hip internal rotation    Hip external rotation    Knee flexion 5 5  Knee extension  5 5  Ankle dorsiflexion 5 5   Ankle plantarflexion    Ankle inversion    Ankle eversion    (Blank rows = not tested)  BED MOBILITY:  Pt reports no difficulty   TRANSFERS: Assistive device utilized: None  Sit to stand: SBA Stand to sit: SBA Performs with no UE support   Pt notes difficulty getting in and out of the car, with bringing the legs in.   Pt reports difficulty getting up off the couch, sometimes will take a couple of tries.    FUNCTIONAL TESTS:  5 times sit to stand: 14 seconds with no UE support  10 meter walk test: 10.91 seconds = 3.01 ft/sec    TODAY'S TREATMENT:                                                                                                                               Therapeutic Exercise:  SciFit with BUE/BLE at [place] setting Gear 5.5 for 8 minutes for neural priming, reciprocal movement patterns, incr amplitude of stepping, activity tolerance. Pt reporting RPE as a 6-7/10  NMR: On rockerboard in A/P direction working on SLS, weight shifting and foot clearance: Alternating forward cone taps 10 reps each direction, progressing to alternating forward/cross body tap 10 reps each side, pt more challenged with SLS on the R side, needing intermittent UE support and pt putting incr pressure through LLE at times  Retro gait 3 x 20' with self ball toss, CGA/min A for balance for manual dual tasking and balance in the posterior direction, pt initially taking very small steps during the first rep, with cues needed for incr step length, pt able to demo improve balance with incr reps  Following multi-directional sheet on mirror Following directions and speaking them out loud with focus on big steps and picking up feet, x2 sets For 2nd set, attempted to add in coordinated UE movements when stepping forwards or posteriorly, pt with difficulty coordinating these, performed 2 different step directions before pt had to use the restroom   During session, cues needed for RUE arm swing during  gait   PATIENT EDUCATION: Education details: Continue HEP + plan to look at car next session Person educated: Patient and Spouse Education method: Explanation, Demonstration, Verbal cues, and Handouts Education comprehension: verbalized understanding, returned demonstration, and needs further education  HOME EXERCISE PROGRAM: Standing PWR moves  Forward/backward stepping  Access Code: [email or web address] URL: [email or web address] Date: 07/30/2023 Prepared by: Sherlie Ban  Exercises - Staggered Stance Weight Shift with Arms Reaching  - 1-2 x daily - 5 x weekly - 2 sets - 10 reps  GOALS: Goals reviewed with patient? Yes  SHORT TERM GOALS:  Target date: 08/06/2023  Pt will be independent with initial HEP for PD specific deficits in order to build upon functional gains made in therapy. Baseline: Goal status: MET  2.  Pt will perform posterior stepping strategy in 3 steps or less in order  to demo improved balance strategies.  Baseline: at least 5 steps, min A for balance   4-5 small steps with min A from therapist for balance (10/30) Goal status: NOT MET  3.  Pt will improve 5x sit<>stand to less than or equal to 12 sec to demonstrate improved functional strength and transfer efficiency.   Baseline: 14 seconds with no UE support  11.3 seconds (10/30) Goal status: MET   LONG TERM GOALS: Target date: 09/03/2023   Pt will be independent with final  HEP for PD specific deficits in order to build upon functional gains made in therapy. Baseline:  Goal status: INITIAL  2.  Pt will improve miniBEST to at least a 22/28 in order to demo decr fall risk Baseline: 17/28 Goal status: INITIAL  3.  Pt will verbalize understanding of local Parkinson's disease resources, including options for continue community fitness.  Baseline:  Goal status: INITIAL  4.  Pt will improve TUG and TUG cog score to less than or equal to 10% difference for improved dual task/decreased fall  risk.  Baseline:  Goal status: INITIAL  5.  Pt will get up from lower/softer surfaces to mimic a couch/recliner mod I in order to demo improved transfers  Baseline:  Goal status: INITIAL    ASSESSMENT:  CLINICAL IMPRESSION:  10th visit PN: Pt initially having incr difficulty with stepping in the posterior direction for stepping strategies when assessed at eval and STG assessment (taking 4-5 steps with min A for balance). During last session when working on it, pt able to demo improved stepping reactions by only needing to take 1-2 steps. Subjectively, pt reports improvements in sit <> stands, balance, and endurance/stamina. Pt reports it takes him a long time with getting in and out of the car. Per pt request, will go over this on Wednesday. Today's skilled session focused on working on balance tasks for stepping and SLS. With multi-directional stepping, pt challenged when adding in coordinated UE movements. Part of session limited today due to pt needing to use the restroom. Pt is progressing well, will continue per POC.    OBJECTIVE IMPAIRMENTS: Abnormal gait, decreased activity tolerance, decreased balance, decreased cognition, decreased coordination, decreased mobility, difficulty walking, decreased strength, impaired flexibility, impaired UE functional use, and postural dysfunction.   ACTIVITY LIMITATIONS: carrying, bending, stairs, transfers, dressing, and locomotion level  PARTICIPATION LIMITATIONS: community activity  PERSONAL FACTORS: Age, Behavior pattern, Past/current experiences, Time since onset of injury/illness/exacerbation, and 3+ comorbidities:  PD,  A fb, stage 3 CKD, HLD, HTN, multiple lacunar infarcts, obesity, type II diabetes   are also affecting patient's functional outcome.   REHAB POTENTIAL: Good  CLINICAL DECISION MAKING: Evolving/moderate complexity  EVALUATION COMPLEXITY: Moderate  PLAN:  PT FREQUENCY: 2x/week  PT DURATION: 8 weeks  PLANNED  INTERVENTIONS: Therapeutic exercises, Therapeutic activity, Neuromuscular re-education, Balance training, Gait training, Patient/Family education, Self Care, Joint mobilization, Stair training, DME instructions, and Re-evaluation  PLAN FOR NEXT SESSION: work on SLS, stepping strategies, larger amplitude movement patterns . SciFit for reciprocal movement, endurance. Gait exercises with arm swing, Posterior balance, could trial resisted gait outdoors on uneven surface,  look at car transfers on Wednesday   Drake Leach, PT, DPT 08/25/2023, 11:57 AM

## 2023-08-25 NOTE — Therapy (Signed)
OUTPATIENT SPEECH LANGUAGE PATHOLOGY PARKINSON'S TREATMENT   Patient Name: Joseph Lamb MRN: [id number] DOB:Nov 10, 1950, 72 y.o., male Today's Date: 08/25/2023  PCP: Lupita Raider MD REFERRING PROVIDER: Vladimir Faster, DO  END OF SESSION:  End of Session - 08/25/23 1116     Visit Number 10    Number of Visits 17    Date for SLP Re-Evaluation 09/03/23    SLP Start Time 1100    SLP Stop Time  1145    SLP Time Calculation (min) 45 min    Activity Tolerance Patient tolerated treatment well              Past Medical History:  Diagnosis Date   Arrhythmia    Atrial fibrillation 10/11/2021   Cerebrovascular disease    moderate per 2021 MRI   Cholecystitis 09/01/2021   Chronic kidney disease, stage 3 unspecified 10/11/2021   Dizzy spells 03/08/2020   Erectile dysfunction 10/11/2021   Essential hypertension 06/14/2015   Essential tremor    Glaucoma 08/31/2021   Hyperlipidemia 06/14/2015   Irregular heart beat    Mild cognitive impairment of uncertain or unknown etiology 05/21/2023   Mixed hyperlipidemia    Multiple lacunar infarcts    bilateral centrum semiovale and right parietal cortex.   Obesity 10/11/2021   Obstructive sleep apnea    no CPAP   Other male erectile dysfunction    Primary open angle glaucoma (POAG) of left eye, moderate stage 10/11/2021   Primary open-angle glaucoma, right eye, mild stage 10/11/2021   Renal insufficiency 09/06/2022   Sinus bradycardia 03/08/2020   Tremor 10/11/2021   Type 2 diabetes mellitus with other specified complication (HCC)    Type II diabetes mellitus 10/11/2021   Vascular dementia [facility])    Past Surgical History:  Procedure Laterality Date   CHOLECYSTECTOMY N/A 08/31/2021   Procedure: LAPAROSCOPIC CHOLECYSTECTOMY;  Surgeon: Quentin Ore, MD;  Location: [facility] OR;  Service: General;  Laterality: N/A;   EYE SURGERY     Glaucoma surgery   PROSTATE SURGERY     Patient Active Problem List   Diagnosis Date  Noted   Essential tremor    Mixed hyperlipidemia    Irregular heart beat    Other male erectile dysfunction    Type 2 diabetes mellitus with other specified complication (HCC)    Vascular dementia (HCC)    Mild cognitive impairment of uncertain or unknown etiology 05/21/2023   Obstructive sleep apnea    Multiple lacunar infarcts    Cerebrovascular disease    Renal insufficiency 09/06/2022   Atrial fibrillation 10/11/2021   Chronic kidney disease, stage 3 unspecified 10/11/2021   Obesity 10/11/2021   Erectile dysfunction 10/11/2021   Primary open angle glaucoma (POAG) of left eye, moderate stage 10/11/2021   Primary open-angle glaucoma, right eye, mild stage 10/11/2021   Tremor 10/11/2021   Type II diabetes mellitus 10/11/2021   Cholecystitis 09/01/2021   Glaucoma 08/31/2021   Arrhythmia    Dizzy spells 03/08/2020   Sinus bradycardia 03/08/2020   Essential hypertension 06/14/2015   Hyperlipidemia 06/14/2015    ONSET DATE: 06/19/2023(referral date)  REFERRING DIAG:  Diagnosis  G20.A1 (ICD-10-CM) - Parkinson's disease without dyskinesia or fluctuating manifestations (HCC)    THERAPY DIAG:  Dysarthria and anarthria  Rationale for Evaluation and Treatment: Rehabilitation  SUBJECTIVE:   SUBJECTIVE STATEMENT: 'I did the one on line for November 4" Pt accompanied by: significant other  PERTINENT HISTORY: From Dr. Don Perking consult: Joseph Lamb was seen in the movement disorders  clinic for neurologic consultation at the request of Rosann Auerbach, PhD.  The consultation is for the evaluation of tremor and Parkinson's.  Pt with wife who supplements hx.  Patient has been seen in our clinic for a long time.  He was first seen by Dr. Karel Jarvis in 2016 and Dr. Marjory Lies prior to that.  Initial evaluation for his further evaluation of memory change, but at that point in time his MoCA was 29/30.  He came back for further evaluation in 2021.  In the June, 2021 evaluation, it is noted that  patient had begun to have hand tremors for 5 months prior.  At that point in time, Dr. Karel Jarvis did not see much on the examination, however.  By August, 2022, Dr. Karel Jarvis did note occasional right thumb resting tremor and mild cogwheeling in the office.  The following visits were with Marlowe Kays.  With time, she began to note right greater than left tremors at rest, mouth tremor, sialorrhea and increased tone.  He was started on levodopa by her in February, 2024.  He apparently discontinued it due to the fact that he felt his heart was fluttering and he d/c the medication.  .he thinks that he took it for a months.  He states that mostly he noted no difference in tremor so he d/c it.  he saw Dr. Milbert Coulter for memory testing in August, 2024, demonstrating mild cognitive impairment.  PAIN:  Are you having pain? No  FALLS: Has patient fallen in last 6 months?  See PT evaluation for details  LIVING ENVIRONMENT: Lives with: lives with their spouse and lives with their son Lives in: House/apartment  PLOF:  Level of assistance: Independent with ADLs, Independent with IADLs Employment: Retired  PATIENT GOALS: "To talk better"  OBJECTIVE:   TODAY'S TREATMENT:                                                                                                                                         DATE:   08/25/23: Joseph Lamb returns with HEP log completed - reviewed need to complete full lesson, not just the warm up, ah, glides and counting. Today, we completed online E-library lesson - November Lesson 3 in Speak Out! He is achieving all dB targets. In cognitive task naming steps in tasks - He generated 4-5 steps  averaging 73dB with mod I with very familiar tasks (baking sweet potato pie and changing oil) and required occasional to usual min verbal and gesture cues to maintain 70dB in less familiar tasks due to increased cognitive load. In conversation, Joseph Lamb required occasional min A to maintain 70-72dB and use  intent. Son plans to attend next session. Instructed him to set up a Speak Out! Account to access [employer].   08/18/23: Joseph Lamb has not filled out HEP log nor completed HEP - he is on Lesson 16 on Speak out! Reviewed requirement to complete  lessons twice daily while receiving ST then 1x a day after d/c. Spouse aware to check his HEP log. I requested that his son attend next session to provide another support person to help complete HEP. Speak Out! Lesson 16 with supervision cues to complete accurately and with specified dB level. He did complete an on line session 1x yesterday. In conversation Burlie required occasional min A to maintain 70dB. Ongoing encouragement to complete HEP more consistently  08/13/23: Joseph Lamb enters with WNL, he filled out exercise for today prior to completing HEP twice. Instructed him to not fill out the log until AFTER he completes the lesson. Speak out! Lesson 15 completed with supervisions cues to complete each exercise accurately and hits dB targets. In simple conversation, Theodora required occasional min verbal cues and gestures to average 70-72dB over 12 minutes.    11/4/24Fayrene Lamb enters with WNL volume. He reports consistent completion of Speak Out! Twice daily and is on Lesson 13. He has used daily HEP log to track his twice daily progress. He completed Speak Out! Lesson 13 hitting all dB targets and completing each section accurately. He self corrected when he spoke in between exercises without intent. We role played placing orders at [employer] and giving/spelling his name, birth date,  address and phone numbers at the doctor's office. Catherine maintained 72+dB in these with supervision cues. In  15 minute conversation re: controversial political issues, Si maintained 70-73dB with occasional min visual and gesture cues. He is to complete 1 on line session by next Monday 08/18/23.   08/06/23: Joseph Lamb did not complete SpeakOut! Yesterday. He said he took the day  off. I generated a log to track his twice daily and the lesson he has just completed. He completed Speak Out! Lesson 9 hitting all dB targets with supervision cues. In conversation over 15 minutes with prompts, then spontaneous, Joseph Lamb averaged 70dB with rare min A.   0/28/24: Joseph Lamb reports consistently completing each lesson in [employer] Out! Workbook twice a day. He enters knowing that he is on Lesson 9. Today we used an Theatre stage manager from [employer]! eLibrary (November week 1) Joseph Lamb required occasional min verbal and visual cues to hold Ah for 10 seconds. He hit all dB targets in the lesson with rare min A (occasional min A for conversation exercises - divergent naming) In simple conversation Joseph Lamb required occasional min verbal and gesture cues to maintain 72dB - volume decreases and cognitive load increases - Wilkin verbalizes awareness of slow processing. Education provided for ways family members can assist him in participating in conversations with small groups of family. He told his other son about his PD - they endorsed that he will watch the "what is Parkinson's" webinar on speak out! Website.   10/23/24Fayrene Lamb reports completing Speak Out! 1x - continue to require him to complete this twice a day. He self correct intent voice 3x with supervision cues in initial conversation.  Targeted volume and intelligibility using Speak Out! Lesson 6. Pt required rare min  verbal cues, modeling, for volume and breath support. Pt averages the following volume levels:  Sustained AH: 93 dB  Counting:87  Reading (phrases): 87dB  Cognitive Exercise: 75dB Required usual mod verbal cues, modeling for carryover of intent /volume completing and explaining idioms   07/28/23: Joseph Lamb reports completing Speak Out! Through lesson 5, however he did not complete each lesson twice. I reviewed requirement to complete each lesson 2x, twice a day. We completed Lesson 5 today.  Targeted  volume and intelligibility using Speak Out!  Lesson 5. Pt required occasional min  verbal cues, modeling, for volume and breath support.  Pt averages the following volume levels:  Sustained AH: 90dB  Counting:84dB  Reading (phrases): 80dB  Cognitive Exercise: 78dB Required usual min to mod verbal cues, modeling for carryover of intent /volume on divergent naming task. Usual min semantic cues to generate 5-8 items in simple categories. In simple description re: holiday traditions, Joseph Lamb averaged 72dB with usual mod modeling, verbal and gesture cues.    07/23/23: Joseph Lamb brings Speak Out! Workbook to the session. He and [name] have watched the Learn About Parkinson's webinar on [employer]. Instructed them to have their son watch it as well. Heron endorses "I have been speaking low for a long time, I didn't realize it"  Previewed the workbook with them, introduced requirement of twice daily practice, advancing lessons after 2 sessions. Demonstrated online Speak Out! Sessions. Targeted volume and intelligibility using Speak Out! Lesson 1. Pt required usual to  occasional min verbal cues and modeling, for volume and breath support.  Pt averages the following volume levels  Sustained AH: 85dB  Counting:85dB  Reading (phrases): 75dB  Cognitive Exercise: 72dB Required usual mod verbal cues, modeling for carryover of intent /volume generating 3 sentence descriptions of basic objects.  following structured practice. In simple conversation over 5 minutes, Joseph Lamb averaged 70dB with usual mod visual and verbal cues.    07/09/23 (eval day): Introduced [employer]! Workbook to [employer]. Ordered work book - Introduced them to [employer], demonstrated location of Learn about [employer] and on Physiological scientist. Instructed them to watch webinar and an online practice session and have their 2 sons watch the webinar as well. Educated re: Automatic vs Intentional motor systems. Joseph Lamb Id'd when he was speaking or  reading with intent vs not using intent. By the end of the session, he was conversing with intent to average 70dB with occasional min a  PATIENT EDUCATION: Education details: See Today's Treatment; See patient instructions; HEP for dysarthria; PD education Person educated: Patient and Spouse Education method: Explanation, Demonstration, Verbal cues, and Handouts Education comprehension: verbalized understanding, returned demonstration, verbal cues required, and needs further education  HOME EXERCISE PROGRAM: Speak Out! Workbook ordered - they are to bring it to ST when it arrives   GOALS: Goals reviewed with patient? Yes  SHORT TERM GOALS: Target date: 08/06/23  Pt will complete HEP hitting target dB's with occasional min A over 1 week Baseline: Goal status: MET  2.  Pt will complete HEP twice a day, completing at least 1 on line session with Parkinson [employer] website Baseline:  Goal status: MET  3.  Pt will averge 72dB 18/20 sentences with rare min A Baseline:  Goal status: MET  4.  Pt will average 72dB over 12 minute converation with rare min A Baseline:  Goal status: MET  5.  Pt and his family will watch Learn about Parkinson's webinar on [employer] website Baseline:  Goal status: MET              LONG TERM GOALS: Target date: 09/03/23  Pt will complete HEP for dysarthria twice daily accurately over 1 week with mod I Baseline:  Goal status:MET   2.  Pt will complete 2 on line practice sessions from [employer] online Baseline:  Goal status: MET  3.  Pt will maintain 70dB over 15 minute conversation with mod I  Baseline:  Goal status: ONGOING  4.  Pt will improve score on Communicative Effectiveness Survey by 3 points Baseline: 19 - spouse rated due to reduced pt awareness Goal status: ONGOING  5.  Pt/spouse will verbalize requirement to continue Speak Out! Daily practice with workbook and online up d/c of ST Baseline:  Goal  status: ONGOING   ASSESSMENT:  CLINICAL IMPRESSION: Patient is a 72 y.o. 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 He is accompanied by his spouse, Teryl Lucy. Griff is completing HEP Speak Out! Program accurately, however he is not completing daily lessons. I provided a log which has not been filled out. Spouse instructed to A Joseph Lamb with remembering his daily practice and log. He continues to require occasional min to mod A to maintain volume in conversation and with mild cognitive load. [name] does report overall improved intelligibility at home.  I recommend skilled ST to maximize intelligibility for independence, QOL, reduce caregiver burden.   OBJECTIVE IMPAIRMENTS: Objective impairments include memory and dysarthria. These impairments are limiting patient from effectively communicating at home and in community.Factors affecting potential to achieve goals and functional outcome are medical prognosis.. Patient will benefit from skilled SLP services to address above impairments and improve overall function.  REHAB POTENTIAL: Good  PLAN:  SLP FREQUENCY: 2x/week  SLP DURATION: 8 weeks  PLANNED INTERVENTIONS: Aspiration precaution training, Diet toleration management , Environmental controls, Cueing hierachy, Cognitive reorganization, Internal/external aids, Functional tasks, Multimodal communication approach, and SLP instruction and feedback MBSS  Speech Therapy Progress Note  Dates of Reporting Period: 07/09/23 to 08/25/23  Objective Reports of Subjective Statement: Pt is achieving target dB in Speak Out! And reports improved intelligibility when he speaks with intent.   Objective Measurements: See goals - Pt averaging 70dB in simple conversations with rare to occasional min A  Goal Update: Continue goals  Plan: Continue POC  Reason Skilled Services are Required: To maximize intelligibility in moderately complex conversations and in speaking tasks with  increased cognitive load, as well as maximize carryover of Speak Out! HEP upon d/c from skilled ST.   Laddie Naeem, Radene Journey, CCC-SLP 08/25/2023, 11:17 AM

## 2023-08-26 ENCOUNTER — Ambulatory Visit: Payer: Medicare Other | Attending: Cardiology | Admitting: Cardiology

## 2023-08-26 ENCOUNTER — Encounter: Payer: Self-pay | Admitting: Cardiology

## 2023-08-26 VITALS — BP 136/66 | HR 52 | Ht 72.0 in | Wt 217.0 lb

## 2023-08-26 DIAGNOSIS — E782 Mixed hyperlipidemia: Secondary | ICD-10-CM | POA: Diagnosis not present

## 2023-08-26 DIAGNOSIS — I4891 Unspecified atrial fibrillation: Secondary | ICD-10-CM | POA: Diagnosis not present

## 2023-08-26 DIAGNOSIS — I10 Essential (primary) hypertension: Secondary | ICD-10-CM

## 2023-08-26 DIAGNOSIS — E1169 Type 2 diabetes mellitus with other specified complication: Secondary | ICD-10-CM | POA: Diagnosis not present

## 2023-08-26 MED ORDER — APIXABAN 5 MG PO TABS: 5.0000 mg | ORAL_TABLET | Freq: Two times a day (BID) | ORAL | 12 refills | Status: DC

## 2023-08-26 NOTE — Addendum Note (Signed)
Addended by: Lonia Farber on: 08/26/2023 09:32 AM   Modules accepted: Orders

## 2023-08-26 NOTE — Patient Instructions (Signed)
Medication Instructions:   Your physician has recommended you make the following change in your medication:   Start taking Eliquis 5 mg twice a day.   Stop taking Asprin.   *If you need a refill on your cardiac medications before your next appointment, please call your pharmacy*   Lab Work:   You will need to have a CMP and a CBC completed today.   You will need to do an ifob in two weeks.   If you have labs (blood work) drawn today and your tests are completely normal, you will receive your results only by: MyChart Message (if you have MyChart) OR A paper copy in the mail If you have any lab test that is abnormal or we need to change your treatment, we will call you to review the results.   Testing/Procedures: Your physician has requested that you have an echocardiogram. Echocardiography is a painless test that uses sound waves to create images of your heart. It provides your doctor with information about the size and shape of your heart and how well your heart's chambers and valves are working. This procedure takes approximately one hour. There are no restrictions for this procedure. Please do NOT wear cologne, perfume, aftershave, or lotions (deodorant is allowed). Please arrive 15 minutes prior to your appointment time.  Please note: We ask at that you not bring children with you during ultrasound (echo/ vascular) testing. Due to room size and safety concerns, children are not allowed in the ultrasound rooms during exams. Our front office staff cannot provide observation of children in our lobby area while testing is being conducted. An adult accompanying a patient to their appointment will only be allowed in the ultrasound room at the discretion of the ultrasound technician under special circumstances. We apologize for any inconvenience.    Follow-Up: At [facility], you and your health needs are our priority.  As part of our continuing mission to provide you with  exceptional heart care, we have created designated Provider Care Teams.  These Care Teams include your primary Cardiologist (physician) and Advanced Practice Providers (APPs -  Physician Assistants and Nurse Practitioners) who all work together to provide you with the care you need, when you need it.  We recommend signing up for the patient portal called "MyChart".  Sign up information is provided on this After Visit Summary.  MyChart is used to connect with patients for Virtual Visits (Telemedicine).  Patients are able to view lab/test results, encounter notes, upcoming appointments, etc.  Non-urgent messages can be sent to your provider as well.   To learn more about what you can do with MyChart, go to [email or web address].    Your next appointment:   2 month(s)  Provider:   Belva Crome, MD   Other Instructions

## 2023-08-26 NOTE — Progress Notes (Signed)
Cardiology Office Note:    Date:  08/26/2023   ID:  JAS REINHOLTZ, DOB [birth year], MRN [id number]  PCP:  Lupita Raider, MD  Cardiologist:  Garwin Brothers, MD   Referring MD: Lupita Raider, MD    ASSESSMENT:    1. Mixed hyperlipidemia   2. Atrial fibrillation, unspecified type (HCC)   3. Essential hypertension   4. Type 2 diabetes mellitus with other specified complication, without long-term current use of insulin (HCC)    PLAN:    In order of problems listed above:  Primary prevention stressed with the patient.  Importance of compliance with diet medication stressed and patient verbalized standing. Paroxysmal atrial fibrillation:I discussed with the patient atrial fibrillation, disease process. Management and therapy including rate and rhythm control, anticoagulation benefits and potential risks were discussed extensively with the patient. Patient had multiple questions which were answered to patient's satisfaction.  This is newly diagnosed.  Will get an echocardiogram to see if he is a candidate for cardioversion based on left atrial size and other parameters.  Currently we have initiated anticoagulation will do baseline blood work today including CBC.  Will give him an ifob to check stool for occult blood. Recent hypertension: Blood pressure stable and diet was emphasized. Diabetes mellitus and mixed dyslipidemia: Diet emphasized.  Lipids followed by primary care.  Goal LDL less than 60. Patient will be seen in follow-up appointment in 2 months or earlier if the patient has any concerns.    Medication Adjustments/Labs and Tests Ordered: Current medicines are reviewed at length with the patient today.  Concerns regarding medicines are outlined above.  Orders Placed This Encounter  Procedures   EKG 12-Lead   No orders of the defined types were placed in this encounter.    No chief complaint on file.    History of Present Illness:    Joseph Lamb is a 72  y.o. male.  Patient has past medical history of essential hypertension, dyslipidemia, diabetes mellitus.  He has history of dementia.  He has recently been diagnosed to be in atrial fibrillation.  No chest pain orthopnea or PND.  At the time of my evaluation, the patient is alert awake oriented and in no distress.  Past Medical History:  Diagnosis Date   Arrhythmia    Atrial fibrillation 10/11/2021   Cerebrovascular disease    moderate per 2021 MRI   Cholecystitis 09/01/2021   Chronic kidney disease, stage 3 unspecified 10/11/2021   Dizzy spells 03/08/2020   Erectile dysfunction 10/11/2021   Essential hypertension 06/14/2015   Essential tremor    Glaucoma 08/31/2021   Hyperlipidemia 06/14/2015   Irregular heart beat    Mild cognitive impairment of uncertain or unknown etiology 05/21/2023   Mixed hyperlipidemia    Multiple lacunar infarcts    bilateral centrum semiovale and right parietal cortex.   Obesity 10/11/2021   Obstructive sleep apnea    no CPAP   Other male erectile dysfunction    Primary open angle glaucoma (POAG) of left eye, moderate stage 10/11/2021   Primary open-angle glaucoma, right eye, mild stage 10/11/2021   Renal insufficiency 09/06/2022   Sinus bradycardia 03/08/2020   Tremor 10/11/2021   Type 2 diabetes mellitus with other specified complication (HCC)    Type II diabetes mellitus 10/11/2021   Vascular dementia [facility])     Past Surgical History:  Procedure Laterality Date   CHOLECYSTECTOMY N/A 08/31/2021   Procedure: LAPAROSCOPIC CHOLECYSTECTOMY;  Surgeon: Quentin Ore, MD;  Location: [facility] OR;  Service: General;  Laterality: N/A;   EYE SURGERY     Glaucoma surgery   PROSTATE SURGERY      Current Medications: Current Meds  Medication Sig   amLODipine (NORVASC) 5 MG tablet TAKE 1 TABLET BY MOUTH EVERY DAY   aspirin EC 81 MG tablet Take 81 mg by mouth daily.   carbidopa-levodopa (SINEMET) 25-100 MG tablet Week 1: Take 1/2  tablet morning, 1/2 tab  noon, and 1/2  tablet in evening/bedtime. Week 4  & thereafter: Take 1 tablet three times daily.   donepezil (ARICEPT) 5 MG tablet Take 1 tablet (5 mg total) by mouth at bedtime.   hydrochlorothiazide (HYDRODIURIL) 25 MG tablet Take 25 mg by mouth at bedtime.   latanoprost (XALATAN) 0.005 % ophthalmic solution Place 1 drop into both eyes at bedtime.   Multiple Vitamins-Minerals (MULTIVITAMIN MEN 50+) TABS Take 1 tablet by mouth daily.   pantoprazole (PROTONIX) 40 MG tablet Take 40 mg by mouth 2 (two) times daily.   pravastatin (PRAVACHOL) 40 MG tablet Take 1 tablet (40 mg total) by mouth daily.   Semaglutide,0.25 or 0.5MG /DOS, (OZEMPIC, 0.25 OR 0.5 MG/DOSE,) 2 MG/3ML SOPN Inject 0.5 mg into the skin once a week.   sucralfate (CARAFATE) 1 g tablet Take 1 g by mouth 2 (two) times daily.   tadalafil (CIALIS) 20 MG tablet Take 20 mg by mouth daily as needed for erectile dysfunction.   timolol (BETIMOL) 0.5 % ophthalmic solution Place 1 drop into both eyes daily.     Allergies:   Lisinopril, Rosuvastatin, Telmisartan, Crestor [rosuvastatin calcium], and Simvastatin   Social History   Socioeconomic History   Marital status: Married    Spouse name: Not on file   Number of children: 2   Years of education: 15   Highest education level: Associate degree: occupational, Scientist, product/process development, or vocational program  Occupational History   Occupation: Retired    Comment: worked at [employer]  Tobacco Use   Smoking status: Never   Smokeless tobacco: Never  Vaping Use   Vaping status: Never Used  Substance and Sexual Activity   Alcohol use: Not Currently   Drug use: No   Sexual activity: Not on file  Other Topics Concern   Not on file  Social History Narrative   Right handed   One story home   Drinks no caffeine, occ soda.   Lives with wife and son   Social Determinants of Health   Financial Resource Strain: Not on file  Food Insecurity: Not on file  Transportation Needs: Not on file   Physical Activity: Not on file  Stress: Not on file  Social Connections: Not on file     Family History: The patient's family history includes Diabetes in his father and mother; Tremor in his mother.  ROS:   Please see the history of present illness.    All other systems reviewed and are negative.  EKGs/Labs/Other Studies Reviewed:    The following studies were reviewed today: .Marland KitchenEKG Interpretation Date/Time:  Tuesday August 26 2023 08:19:13 EST Ventricular Rate:  52 PR Interval:    QRS Duration:  82 QT Interval:  434 QTC Calculation: 403 R Axis:   13  Text Interpretation: Atrial fibrillation with slow ventricular response Minimal voltage criteria for LVH, may be normal variant ( R in aVL ) Inferior infarct , age undetermined Possible Anterior infarct , age undetermined When compared with ECG of 31-Aug-2021 05:28, Atrial fibrillation has replaced Sinus rhythm Inferior infarct is now Present  Inverted T waves have replaced nonspecific T wave abnormality in Inferior leads Confirmed by Belva Crome [phone]) on 08/26/2023 8:58:49 AM     Recent Labs: No results found for requested labs within last 365 days.  Recent Lipid Panel No results found for: "CHOL", "TRIG", "HDL", "CHOLHDL", "VLDL", "LDLCALC", "LDLDIRECT"  Physical Exam:    VS:  BP 136/66   Pulse (!) 52   Ht 6' (1.829 m)   Wt 217 lb 0.6 oz (98.4 kg)   SpO2 97%   BMI 29.44 kg/m     Wt Readings from Last 3 Encounters:  08/26/23 217 lb 0.6 oz (98.4 kg)  06/19/23 210 lb 9.6 oz (95.5 kg)  12/02/22 219 lb 4.8 oz (99.5 kg)     GEN: Patient is in no acute distress HEENT: Normal NECK: No JVD; No carotid bruits LYMPHATICS: No lymphadenopathy CARDIAC: Hear sounds regular, 2/6 systolic murmur at the apex. RESPIRATORY:  Clear to auscultation without rales, wheezing or rhonchi  ABDOMEN: Soft, non-tender, non-distended MUSCULOSKELETAL:  No edema; No deformity  SKIN: Warm and dry NEUROLOGIC:  Alert and oriented x  3 PSYCHIATRIC:  Normal affect   Signed, Garwin Brothers, MD  08/26/2023 9:08 AM    [facility]

## 2023-08-27 ENCOUNTER — Encounter: Payer: Self-pay | Admitting: Physical Therapy

## 2023-08-27 ENCOUNTER — Ambulatory Visit: Payer: Medicare Other | Admitting: Podiatry

## 2023-08-27 ENCOUNTER — Ambulatory Visit: Payer: Medicare Other | Admitting: Speech Pathology

## 2023-08-27 ENCOUNTER — Encounter: Payer: Self-pay | Admitting: Occupational Therapy

## 2023-08-27 ENCOUNTER — Encounter: Payer: Self-pay | Admitting: Speech Pathology

## 2023-08-27 ENCOUNTER — Ambulatory Visit: Payer: Medicare Other | Admitting: Physical Therapy

## 2023-08-27 ENCOUNTER — Ambulatory Visit: Payer: Medicare Other | Admitting: Occupational Therapy

## 2023-08-27 VITALS — BP 104/56 | HR 68

## 2023-08-27 DIAGNOSIS — M6281 Muscle weakness (generalized): Secondary | ICD-10-CM

## 2023-08-27 DIAGNOSIS — R2681 Unsteadiness on feet: Secondary | ICD-10-CM

## 2023-08-27 DIAGNOSIS — R278 Other lack of coordination: Secondary | ICD-10-CM

## 2023-08-27 DIAGNOSIS — R29818 Other symptoms and signs involving the nervous system: Secondary | ICD-10-CM

## 2023-08-27 DIAGNOSIS — R471 Dysarthria and anarthria: Secondary | ICD-10-CM | POA: Diagnosis not present

## 2023-08-27 NOTE — Therapy (Signed)
OUTPATIENT SPEECH LANGUAGE PATHOLOGY PARKINSON'S TREATMENT   Patient Name: Joseph Lamb MRN: [id number] DOB:Oct 17, 1950, 72 y.o., male Today's Date: 08/27/2023  PCP: Joseph Raider Lamb REFERRING PROVIDER: Vladimir Faster, Lamb  END OF SESSION:  End of Session - 08/27/23 1245     Visit Number 11    Number of Visits 17    Date for SLP Re-Evaluation 09/03/23    SLP Start Time 1230    SLP Stop Time  1315    SLP Time Calculation (min) 45 min    Activity Tolerance Patient tolerated treatment well               Past Medical History:  Diagnosis Date   Arrhythmia    Atrial fibrillation 10/11/2021   Cerebrovascular disease    moderate per 2021 MRI   Cholecystitis 09/01/2021   Chronic kidney disease, stage 3 unspecified 10/11/2021   Dizzy spells 03/08/2020   Erectile dysfunction 10/11/2021   Essential hypertension 06/14/2015   Essential tremor    Glaucoma 08/31/2021   Hyperlipidemia 06/14/2015   Irregular heart beat    Mild cognitive impairment of uncertain or unknown etiology 05/21/2023   Mixed hyperlipidemia    Multiple lacunar infarcts    bilateral centrum semiovale and right parietal cortex.   Obesity 10/11/2021   Obstructive sleep apnea    no CPAP   Other male erectile dysfunction    Primary open angle glaucoma (POAG) of left eye, moderate stage 10/11/2021   Primary open-angle glaucoma, right eye, mild stage 10/11/2021   Renal insufficiency 09/06/2022   Sinus bradycardia 03/08/2020   Tremor 10/11/2021   Type 2 diabetes mellitus with other specified complication (HCC)    Type II diabetes mellitus 10/11/2021   Vascular dementia [facility])    Past Surgical History:  Procedure Laterality Date   CHOLECYSTECTOMY N/A 08/31/2021   Procedure: LAPAROSCOPIC CHOLECYSTECTOMY;  Surgeon: Joseph Lamb;  Location: [facility] OR;  Service: General;  Laterality: N/A;   EYE SURGERY     Glaucoma surgery   PROSTATE SURGERY     Patient Active Problem List   Diagnosis Date  Noted   Essential tremor    Mixed hyperlipidemia    Irregular heart beat    Other male erectile dysfunction    Type 2 diabetes mellitus with other specified complication (HCC)    Vascular dementia (HCC)    Mild cognitive impairment of uncertain or unknown etiology 05/21/2023   Obstructive sleep apnea    Multiple lacunar infarcts    Cerebrovascular disease    Renal insufficiency 09/06/2022   Atrial fibrillation 10/11/2021   Chronic kidney disease, stage 3 unspecified 10/11/2021   Obesity 10/11/2021   Erectile dysfunction 10/11/2021   Primary open angle glaucoma (POAG) of left eye, moderate stage 10/11/2021   Primary open-angle glaucoma, right eye, mild stage 10/11/2021   Tremor 10/11/2021   Type II diabetes mellitus 10/11/2021   Cholecystitis 09/01/2021   Glaucoma 08/31/2021   Arrhythmia    Dizzy spells 03/08/2020   Sinus bradycardia 03/08/2020   Essential hypertension 06/14/2015   Hyperlipidemia 06/14/2015    ONSET DATE: 06/19/2023(referral date)  REFERRING DIAG:  Diagnosis  G20.A1 (ICD-10-CM) - 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  The consultation is for the evaluation of tremor and Parkinson's.  Pt with wife who supplements hx.  Patient has been seen in our clinic for a long time.  He was first seen by Joseph Lamb in 2016 and Joseph Lamb prior to that.  Initial evaluation for his further evaluation of memory change, but at that point in time his MoCA was 29/30.  He came back for further evaluation in 2021.  In the June, 2021 evaluation, it is noted that patient  had begun to have hand tremors for 5 months prior.  At that point in time, Joseph Lamb did not see much on the examination, however.  By August, 2022, Joseph Lamb did note occasional right thumb resting tremor and mild cogwheeling in the office.  The following visits were with Joseph Lamb.  With time, she began to note right greater than left tremors at rest, mouth tremor, sialorrhea and increased tone.  He was started on levodopa by her in February, 2024.  He apparently discontinued it due to the fact that he felt his heart was fluttering and he d/c the medication.  .he thinks that he took it for a months.  He states that mostly he noted no difference in tremor so he d/c it.  he saw Joseph Lamb for memory testing in August, 2024, demonstrating mild cognitive impairment.  PAIN:  Are you having pain? No  FALLS: Has patient fallen in last 6 months?  See PT evaluation for details  LIVING ENVIRONMENT: Lives with: lives with their spouse and lives with their son Lives in: House/apartment  PLOF:  Level of assistance: Independent with ADLs, Independent with IADLs Employment: Retired  PATIENT GOALS: "To talk better"  OBJECTIVE:   TODAY'S TREATMENT:                                                                                                                                         DATE:   08/27/23: HEP log continues to be filled Lamb indicating twice daily completion of Joseph Lamb! Targeted intelligibility and volume speaking with intent using Joseph Lamb! eLibrary lesson November lesson 4. Joseph Lamb achieved target dB levels on all structured tasks. In conversation Joseph Lamb maintained 72dB average with rare min A. Communicative Effectiveness Survey completed by spouse pre and post improved from 19/30 to 26/30. He improved in all items except being heard in a noisy environment.   08/25/23: Joseph Lamb returns with HEP log completed - reviewed need to complete full lesson, not just the warm up, ah, glides and counting.  Today, we completed online E-library lesson - November Lesson 3 in Joseph Lamb! He is achieving all dB targets. In cognitive task naming steps in tasks - He generated 4-5 steps  averaging 73dB with mod I with very familiar tasks (baking sweet potato pie and changing oil) and required occasional to usual min  verbal and gesture cues to maintain 70dB in less familiar tasks due to increased cognitive load. In conversation, Dena required occasional min A to maintain 70-72dB and use intent. Son plans to attend next session. Instructed him to set up a Joseph Lamb! Account to access [employer].   08/18/23: Joseph Lamb has not filled Lamb HEP log nor completed HEP - he is on Lesson 16 on Joseph Lamb! Reviewed requirement to complete lessons twice daily while receiving ST then 1x a day after d/c. Spouse aware to check his HEP log. I requested that his son attend next session to provide another support person to help complete HEP. Joseph Lamb! Lesson 16 with supervision cues to complete accurately and with specified dB level. He did complete an on line session 1x yesterday. In conversation Adorian required occasional min A to maintain 70dB. Ongoing encouragement to complete HEP more consistently  08/13/23: Joseph Lamb enters with WNL, he filled Lamb exercise for today prior to completing HEP twice. Instructed him to not fill Lamb the log until AFTER he completes the lesson. Joseph Lamb! Lesson 15 completed with supervisions cues to complete each exercise accurately and hits dB targets. In simple conversation, Dakodah required occasional min verbal cues and gestures to average 70-72dB over 12 minutes.    11/4/24Fayrene Lamb enters with WNL volume. He reports consistent completion of Joseph Lamb! Twice daily and is on Lesson 13. He has used daily HEP log to track his twice daily progress. He completed Joseph Lamb! Lesson 13 hitting all dB targets and completing each section accurately. He self corrected when he spoke in between exercises without intent. We role  played placing orders at [employer] and giving/spelling his name, birth date,  address and phone numbers at the doctor's office. Tadan maintained 72+dB in these with supervision cues. In  15 minute conversation re: controversial political issues, Trapper maintained 70-73dB with occasional min visual and gesture cues. He is to complete 1 on line session by next Monday 08/18/23.   08/06/23: Joseph Lamb did not complete [employer]! Yesterday. He said he took the day off. I generated a log to track his twice daily and the lesson he has just completed. He completed Joseph Lamb! Lesson 9 hitting all dB targets with supervision cues. In conversation over 15 minutes with prompts, then spontaneous, Kru averaged 70dB with rare min A.   0/28/24: Joseph Lamb reports consistently completing each lesson in Joseph Lamb! Workbook twice a day. He enters knowing that he is on Lesson 9. Today we used an Theatre stage manager from Joseph Lamb! eLibrary (November week 1) Joseph Lamb required occasional min verbal and visual cues to hold Ah for 10 seconds. He hit all dB targets in the lesson with rare min A (occasional min A for conversation exercises - divergent naming) In simple conversation Antwione required occasional min verbal and gesture cues to maintain 72dB - volume decreases and cognitive load increases - Rodriques verbalizes awareness of slow processing. Education provided for ways family members can assist him in participating in conversations with small groups of family. He told his other son about his PD - they endorsed that he will watch the "what is Parkinson's" webinar on Joseph Lamb! Website.   10/23/24Fayrene Lamb reports completing Joseph Lamb! 1x - continue to require him to complete this twice a day. He self correct intent voice 3x with supervision cues in initial conversation.  Targeted volume and intelligibility using Joseph Lamb! Lesson 6. Pt required rare min  verbal cues, modeling, for volume and breath  support. Pt averages the  following volume levels:  Sustained AH: 93 dB  Counting:87  Reading (phrases): 87dB  Cognitive Exercise: 75dB Required usual mod verbal cues, modeling for carryover of intent /volume completing and explaining idioms   07/28/23: Devlan reports completing Joseph Lamb! Through lesson 5, however he did not complete each lesson twice. I reviewed requirement to complete each lesson 2x, twice a day. We completed Lesson 5 today.  Targeted volume and intelligibility using Joseph Lamb! Lesson 5. Pt required occasional min  verbal cues, modeling, for volume and breath support.  Pt averages the following volume levels:  Sustained AH: 90dB  Counting:84dB  Reading (phrases): 80dB  Cognitive Exercise: 78dB Required usual min to mod verbal cues, modeling for carryover of intent /volume on divergent naming task. Usual min semantic cues to generate 5-8 items in simple categories. In simple description re: holiday traditions, Yancy averaged 72dB with usual mod modeling, verbal and gesture cues.    07/23/23: Jashun brings Joseph Lamb! Workbook to the session. He and [name] have watched the Learn About Parkinson's webinar on [employer]. Instructed them to have their son watch it as well. Javione endorses "I have been speaking low for a long time, I didn't realize it"  Previewed the workbook with them, introduced requirement of twice daily practice, advancing lessons after 2 sessions. Demonstrated online Joseph Lamb! Sessions. Targeted volume and intelligibility using Joseph Lamb! Lesson 1. Pt required usual to  occasional min verbal cues and modeling, for volume and breath support.  Pt averages the following volume levels  Sustained AH: 85dB  Counting:85dB  Reading (phrases): 75dB  Cognitive Exercise: 72dB Required usual mod verbal cues, modeling for carryover of intent /volume generating 3 sentence descriptions of basic objects.  following structured practice. In simple conversation over 5 minutes,  Vyom averaged 70dB with usual mod visual and verbal cues.    07/09/23 (eval day): Introduced [employer]! Workbook to [employer]. Ordered work book - Introduced them to [employer], demonstrated location of Learn about [employer] and on Physiological scientist. Instructed them to watch webinar and an online practice session and have their 2 sons watch the webinar as well. Educated re: Automatic vs Intentional motor systems. Joseph Lamb Id'd when he was speaking or reading with intent vs not using intent. By the end of the session, he was conversing with intent to average 70dB with occasional min a  PATIENT EDUCATION: Education details: See Today's Treatment; See patient instructions; HEP for dysarthria; PD education Person educated: Patient and Spouse Education method: Explanation, Demonstration, Verbal cues, and Handouts Education comprehension: verbalized understanding, returned demonstration, verbal cues required, and needs further education  HOME EXERCISE PROGRAM: Joseph Lamb! Workbook ordered - they are to bring it to ST when it arrives   GOALS: Goals reviewed with patient? Yes  SHORT TERM GOALS: Target date: 08/06/23  Pt will complete HEP hitting target dB's with occasional min A over 1 week Baseline: Goal status: MET  2.  Pt will complete HEP twice a day, completing at least 1 on line session with Parkinson [employer] website Baseline:  Goal status: MET  3.  Pt will averge 72dB 18/20 sentences with rare min A Baseline:  Goal status: MET  4.  Pt will average 72dB over 12 minute converation with rare min A Baseline:  Goal status: MET  5.  Pt and his family will watch Learn about Parkinson's webinar on [employer] website Baseline:  Goal status: MET  LONG TERM GOALS: Target date: 09/03/23  Pt will complete HEP for dysarthria twice daily accurately over 1 week with mod I Baseline:  Goal status:MET   2.  Pt will complete 2 on  line practice sessions from [employer] online Baseline:  Goal status: MET  3.  Pt will maintain 70dB over 15 minute conversation with mod I Baseline:  Goal status: ONGOING  4.  Pt will improve score on Communicative Effectiveness Survey by 3 points Baseline: 19 - spouse rated due to reduced pt awareness Goal status: ONGOING  5.  Pt/spouse will verbalize requirement to continue Joseph Lamb! Daily practice with workbook and online up d/c of ST Baseline:  Goal status: ONGOING   ASSESSMENT:  CLINICAL IMPRESSION: Patient is a 72 y.o. 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 He is accompanied by his spouse, Teryl Lucy. Gurtaj is completing HEP Joseph Lamb! Program accurately, however he is not completing daily lessons. I provided a log which has not been filled Lamb. Spouse instructed to A Kee with remembering his daily practice and log. He continues to require occasional min to mod A to maintain volume in conversation and with mild cognitive load. [name] does report overall improved intelligibility at home.  I recommend skilled ST to maximize intelligibility for independence, QOL, reduce caregiver burden.   OBJECTIVE IMPAIRMENTS: Objective impairments include memory and dysarthria. These impairments are limiting patient from effectively communicating at home and in community.Factors affecting potential to achieve goals and functional outcome are medical prognosis.. Patient will benefit from skilled SLP services to address above impairments and improve overall function.  REHAB POTENTIAL: Good  PLAN:  SLP FREQUENCY: 2x/week  SLP DURATION: 8 weeks  PLANNED INTERVENTIONS: Aspiration precaution training, Diet toleration management , Environmental controls, Cueing hierachy, Cognitive reorganization, Internal/external aids, Functional tasks, Multimodal communication approach, and SLP instruction and feedback MBSS  Speech Therapy Progress Note  Dates of  Reporting Period: 07/09/23 to 08/25/23  Objective Reports of Subjective Statement: Pt is achieving target dB in Joseph Lamb! And reports improved intelligibility when he speaks with intent.   Objective Measurements: See goals - Pt averaging 70dB in simple conversations with rare to occasional min A  Goal Update: Continue goals  Plan: Continue POC  Reason Skilled Services are Required: To maximize intelligibility in moderately complex conversations and in speaking tasks with increased cognitive load, as well as maximize carryover of Joseph Lamb! HEP upon d/c from skilled ST.   Marranda Arakelian, Radene Journey, CCC-SLP 08/27/2023, 1:19 PM

## 2023-08-27 NOTE — Therapy (Signed)
OUTPATIENT OCCUPATIONAL THERAPY PARKINSON'S TREATMENT  Patient Name: Joseph Lamb MRN: [id number] DOB:[birth year], 72 y.o., male Today's Date: 08/27/2023  PCP: Lupita Raider, MD REFERRING PROVIDER: Vladimir Faster, DO  END OF SESSION:  OT End of Session - 08/27/23 1359     Visit Number 7    Number of Visits 20    Date for OT Re-Evaluation 10/03/23    Authorization Type [employer] Medicare 2024 10% coinsur NO Ded VL: [place] Auth Not Reqd    Progress Note Due on Visit 10    OT Start Time 1400    OT Stop Time 1445    OT Time Calculation (min) 45 min    Activity Tolerance Patient tolerated treatment well    Behavior During Therapy [facility] for tasks assessed/performed             Past Medical History:  Diagnosis Date   Arrhythmia    Atrial fibrillation 10/11/2021   Cerebrovascular disease    moderate per 2021 MRI   Cholecystitis 09/01/2021   Chronic kidney disease, stage 3 unspecified 10/11/2021   Dizzy spells 03/08/2020   Erectile dysfunction 10/11/2021   Essential hypertension 06/14/2015   Essential tremor    Glaucoma 08/31/2021   Hyperlipidemia 06/14/2015   Irregular heart beat    Mild cognitive impairment of uncertain or unknown etiology 05/21/2023   Mixed hyperlipidemia    Multiple lacunar infarcts    bilateral centrum semiovale and right parietal cortex.   Obesity 10/11/2021   Obstructive sleep apnea    no CPAP   Other male erectile dysfunction    Primary open angle glaucoma (POAG) of left eye, moderate stage 10/11/2021   Primary open-angle glaucoma, right eye, mild stage 10/11/2021   Renal insufficiency 09/06/2022   Sinus bradycardia 03/08/2020   Tremor 10/11/2021   Type 2 diabetes mellitus with other specified complication (HCC)    Type II diabetes mellitus 10/11/2021   Vascular dementia [facility])    Past Surgical History:  Procedure Laterality Date   CHOLECYSTECTOMY N/A 08/31/2021   Procedure: LAPAROSCOPIC CHOLECYSTECTOMY;  Surgeon: Quentin Ore, MD;   Location: [facility] OR;  Service: General;  Laterality: N/A;   EYE SURGERY     Glaucoma surgery   PROSTATE SURGERY     Patient Active Problem List   Diagnosis Date Noted   Essential tremor    Mixed hyperlipidemia    Irregular heart beat    Other male erectile dysfunction    Type 2 diabetes mellitus with other specified complication (HCC)    Vascular dementia (HCC)    Mild cognitive impairment of uncertain or unknown etiology 05/21/2023   Obstructive sleep apnea    Multiple lacunar infarcts    Cerebrovascular disease    Renal insufficiency 09/06/2022   Atrial fibrillation 10/11/2021   Chronic kidney disease, stage 3 unspecified 10/11/2021   Obesity 10/11/2021   Erectile dysfunction 10/11/2021   Primary open angle glaucoma (POAG) of left eye, moderate stage 10/11/2021   Primary open-angle glaucoma, right eye, mild stage 10/11/2021   Tremor 10/11/2021   Type II diabetes mellitus 10/11/2021   Cholecystitis 09/01/2021   Glaucoma 08/31/2021   Arrhythmia    Dizzy spells 03/08/2020   Sinus bradycardia 03/08/2020   Essential hypertension 06/14/2015   Hyperlipidemia 06/14/2015    ONSET DATE: 06/19/2023  REFERRING DIAG: G20.A1 (ICD-10-CM) - Parkinson's disease without dyskinesia or fluctuating manifestations  THERAPY DIAG:  Other lack of coordination  Other symptoms and signs involving the nervous system  Muscle weakness (generalized)  Unsteadiness on  feet  Rationale for Evaluation and Treatment: Rehabilitation  SUBJECTIVE:   SUBJECTIVE STATEMENT: Pt reports stretch with PWR! Twist.   Pt accompanied by: self   PERTINENT HISTORY:  PMH: PD (newly diagnosed), A fb, stage 3 CKD, HLD, HTN, multiple lacunar infarcts, obesity, type II diabetes  Re: Dr. Arbutus Leas Neuro appt: 06/19/2023 Consultation is for the evaluation of tremor and Parkinson's.  He was first seen by Dr. Karel Jarvis in 2016 and Dr. Marjory Lies prior to that.  Initial evaluation for his further evaluation of memory change, but  at that point in time his MoCA was 29/30.  He came back for further evaluation in 2021.  In the June, 2021, it was noted that patient had began to have hand tremors for 5 months prior.  At that point in time, Dr. Karel Jarvis did not see much on the examination, however.  By August, 2022, Dr. Karel Jarvis did note occasional right thumb resting tremor and mild cogwheeling in the office.  The following visits were with Marlowe Kays.  With time, she began to note right greater than left tremors at rest, mouth tremor, sialorrhea and increased tone.  He was started on levodopa by her in February, 2024.  He apparently discontinued it due to the fact that he felt his heart was fluttering and he d/c the medication. He states that mostly he noted no difference in tremor so he d/c it.  He saw Dr. Milbert Coulter for memory testing in August, 2024, demonstrating mild cognitive impairment.  Dr. Milbert Coulter referred him to Dr. Arbutus Leas for further evaluation.   PRECAUTIONS: None  WEIGHT BEARING RESTRICTIONS: No  PAIN:  Are you having pain? Yes: NPRS scale: 4-5/10 Pain location: R hand and shoulder & abdominal Pain description: gnawing pain Aggravating factors: When I get upset/tired, eat certain foods - hot/spicy ie) hamburgers, BBQ Relieving factors: Stop the shaking in R hand/arm, Tums  FALLS: Has patient fallen in last 6 months? No  LIVING ENVIRONMENT: Lives with: lives with their spouse and lives with their son Lives in: House/apartment Stairs: Yes: External: 3 steps; bilateral but cannot reach both Has following equipment at home: shower chair and Grab bars  PLOF: Independent Sometimes needs help with buttoning & tying laces  PATIENT GOALS: To help with stiffness and dexterity in his hands/arms ie) has/had trouble turning to reach to back seat of the car, trouble with some dressing/buttons and other ADLs compared to PLOF.  OBJECTIVE:  Note: Objective measures were completed at Evaluation unless otherwise noted.  HAND DOMINANCE:  Right  ADLs: Overall ADLs: Mod I Transfers/ambulation related to ADLs: Ind without AE at this time Eating: Does it himself, including cutting food. Grooming: "I do an adequate job." Re: shaving (only missing a little bit) UB Dressing: Wife may help to pull down his shirt in the back LB Dressing: Takes extra time to tie laces Toileting: Mod I Bathing: Decent job. 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 and they were bubble packed but thinks he'll have to  Financial management: Wife is the Management consultant" Handwriting: 50% legible and Mild micrographia - see eval for sample writing  MOBILITY STATUS: Independent  POSTURE COMMENTS:  rounded shoulders and forward head  ACTIVITY TOLERANCE: Activity tolerance: Good  FUNCTIONAL OUTCOME MEASURES: Fastening/unfastening 3 buttons: 42.68 sec Physical performance test: PPT#2 (simulated eating) 15.1 sec & PPT#4 (donning/doffing jacket): 16.09 sec  UEFI = 62/80 (see specific areas in eval)  COORDINATION: 9 Hole Peg test: Right: 36.28  sec; Left: 36.4 sec Box and Blocks:  Right 47 blocks, Left 43 blocks Tremors: Resting and Right  UE ROM:  WFL - slight end range limitations  UE MMT:   WFL Grip Strength: Right 57.5, 71.4, 75.6 Average 68.2 lbs Left 55.9, 70.5, 83.3 Average 69.9 lbs  SENSATION: WFL  MUSCLE TONE: TBA  COGNITION: Overall cognitive status: Within functional limits for tasks assessed - See ST evaluation  OBSERVATIONS: Dystonia  TODAY'S TREATMENT:                                                                                                                                 Pt issued coordination HEP with modifications prn and cues to move big - see pt instructions for details.   Practiced handwriting with cues to slow down, aim big, and deliberately form each letter. Pt practiced signing name in cursive and then wrote simple sentence x 3 in print.   PATIENT EDUCATION: Education details: coordination HEP  Person educated: Patient and Spouse Education method: Explanation, Facilities manager, Verbal cues, and Handouts Education comprehension: verbalized understanding, returned demonstration, verbal cues required, and needs further education  HOME EXERCISE PROGRAM: 07/30/2023: PWR! Hands; BIG ADLs 08/06/23: handwriting strategies 08/11/2023: PWR! Sitting; [place] method 08/13/2023: Tremor reduction strategies 08/27/23: coordination HEP   GOALS: Goals reviewed with patient? Yes  GOALS:  SHORT TERM GOALS: Target date: 08/27/2023    Pt will be independent with PD specific HEP.  Baseline: not yet initiated Goal status: IN PROGRESS  2.  Pt will verbalize understanding of adapted strategies to maximize safety and independence with ADLs/IADLs.  Baseline: not yet initiated Goal status: IN PROGRESS  3.  Pt will write his name/simple list with no significant decrease in size and maintain 75% legibility.  Baseline: 50% legibility Goal status: IN PROGRESS  4.  Pt will demonstrate improved fine motor coordination for ADLs as evidenced by decreasing 9 hole peg test score for BUE and by 3-5 secs  Baseline: Right: 36.28  sec; Left: 36.4 sec Goal status: INITIAL  5.  Pt will be able to place at least 3-5 additional blocks using B hand with completion of Box and Blocks test.  Baseline: Right 47 blocks, Left 43 blocks Goal status: INITIAL   LONG TERM GOALS: Target date: 10/10/2023    Pt will verbalize understanding of ways to prevent future PD related complications and PD community resources.  Baseline: not yet  initiated Goal status: INITIAL  2.   Pt will verbalize understanding of ways to keep thinking skills sharp and ways to compensate for STM changes in the future.  Baseline:  not yet initiated Goal status: INITIAL  3.  Pt will demonstrate improved ease with feeding as evidenced by decreasing PPT#2 by 2-4 secs.  Baseline: 15.1 sec Goal status: INITIAL  4.  Pt will demonstrate increased ease with dressing as evidenced by decreasing PPT#4 (don/ doff jacket) by 2-4 secs or more.  Baseline: 16.09 sec 08/11/2023: 17 seconds with personal jacket Goal status: IN PROGRESS  5. Pt will demonstrate improved ease with fastening buttons as evidenced by decreasing 3 button/unbutton time by 2-5 seconds  Baseline: 42.68 sec seconds Goal status: INITIAL  ASSESSMENT:  CLINICAL IMPRESSION: Pt demonstrates good understanding of PD specific strategies as needed to progress towards goals. Pt responds well to boosts with PWR! moves. He will require further practice for proper carryover.   PERFORMANCE DEFICITS: in functional skills including ADLs, IADLs, coordination, dexterity, tone, ROM, strength, pain, flexibility, Fine motor control, Gross motor control, balance, endurance, decreased knowledge of precautions, decreased knowledge of use of DME, and UE functional use, cognitive skills including safety awareness, and psychosocial skills including coping strategies and environmental adaptation.   IMPAIRMENTS: are limiting patient from ADLs, IADLs, and leisure.   COMORBIDITIES:  may have co-morbidities  that affects occupational performance. Patient will benefit from skilled OT to address above impairments and improve overall function.  REHAB POTENTIAL: Excellent  PLAN:  OT FREQUENCY: 2x/week  OT DURATION: 10 weeks  PLANNED INTERVENTIONS: 97535 self care/ADL training, 16109 therapeutic exercise, 97530 therapeutic activity, 97112 neuromuscular re-education, functional mobility training, psychosocial  skills training, energy conservation, coping strategies training, patient/family education, and DME and/or AE instructions  RECOMMENDED OTHER SERVICES: Pt received PT and ST evaluations already and appts in place for all 3 services  CONSULTED AND AGREED WITH PLAN OF CARE: Patient and family member/caregiver  PLAN FOR NEXT SESSION:   Practice buttons, ways to prevent future PD related complications, begin assessing STG's   Sheran Lawless, OT 08/27/2023, 2:00 PM

## 2023-08-27 NOTE — Patient Instructions (Signed)
   [email or web address].  Use the link on the [employer] website or the address above to watch Samantha's session on [employer]  Email or call to get access to the eLibrary so you can practice with A Year of [employer], Out and About and [place] lessons as well as have an online Speak Out! Workbook

## 2023-08-27 NOTE — Patient Instructions (Signed)
Coordination Exercises  Perform the following exercises for 15 minutes 1-2 times per day. Perform with both hand(s). Perform using big movements.  Flipping Cards: Place deck of cards on the table. Flip cards over by opening your hand big to grasp and then turn your palm up big. Deal cards: Hold 1/2 or whole deck in your hand. Use thumb to push card off top of deck with one big push. Rotate ball with fingertips: Pick up with fingers/thumb and move as much as you can with each turn/movement (clockwise and counter-clockwise). Toss ball in the air and catch with the same hand: Toss big/high. Pick up 5 coins one at a time and hold in palm. Then, move coins from palm to fingertips one at a time to stack or place in container. Practice writing: Slow down, write big, and focus on forming each letter. Perform "Flicks"/hand stretches (PWR! Hands): Close hands then flick out your fingers with focus on opening hands, pulling wrists back, and extending elbows like you are pushing. Flip pen over and inch down to writing end, then flip over and inch down to "eraser" end

## 2023-08-27 NOTE — Therapy (Signed)
OUTPATIENT PHYSICAL THERAPY NEURO TREATMENT   Patient Name: Joseph Lamb MRN: [id number] DOB:22-Apr-1951, 72 y.o., male Today's Date: 08/27/2023   PCP: Lupita Raider, MD  REFERRING PROVIDER:  Vladimir Faster, DO  END OF SESSION:  PT End of Session - 08/27/23 1318     Visit Number 11    Number of Visits 13    Date for PT Re-Evaluation 09/07/23    Authorization Type [employer]    PT Start Time 1317    PT Stop Time 1358    PT Time Calculation (min) 41 min    Equipment Utilized During Treatment Gait belt    Activity Tolerance Patient tolerated treatment well    Behavior During Therapy WFL for tasks assessed/performed              Past Medical History:  Diagnosis Date   Arrhythmia    Atrial fibrillation 10/11/2021   Cerebrovascular disease    moderate per 2021 MRI   Cholecystitis 09/01/2021   Chronic kidney disease, stage 3 unspecified 10/11/2021   Dizzy spells 03/08/2020   Erectile dysfunction 10/11/2021   Essential hypertension 06/14/2015   Essential tremor    Glaucoma 08/31/2021   Hyperlipidemia 06/14/2015   Irregular heart beat    Mild cognitive impairment of uncertain or unknown etiology 05/21/2023   Mixed hyperlipidemia    Multiple lacunar infarcts    bilateral centrum semiovale and right parietal cortex.   Obesity 10/11/2021   Obstructive sleep apnea    no CPAP   Other male erectile dysfunction    Primary open angle glaucoma (POAG) of left eye, moderate stage 10/11/2021   Primary open-angle glaucoma, right eye, mild stage 10/11/2021   Renal insufficiency 09/06/2022   Sinus bradycardia 03/08/2020   Tremor 10/11/2021   Type 2 diabetes mellitus with other specified complication (HCC)    Type II diabetes mellitus 10/11/2021   Vascular dementia [facility])    Past Surgical History:  Procedure Laterality Date   CHOLECYSTECTOMY N/A 08/31/2021   Procedure: LAPAROSCOPIC CHOLECYSTECTOMY;  Surgeon: Quentin Ore, MD;  Location: [facility] OR;  Service:  General;  Laterality: N/A;   EYE SURGERY     Glaucoma surgery   PROSTATE SURGERY     Patient Active Problem List   Diagnosis Date Noted   Essential tremor    Mixed hyperlipidemia    Irregular heart beat    Other male erectile dysfunction    Type 2 diabetes mellitus with other specified complication (HCC)    Vascular dementia (HCC)    Mild cognitive impairment of uncertain or unknown etiology 05/21/2023   Obstructive sleep apnea    Multiple lacunar infarcts    Cerebrovascular disease    Renal insufficiency 09/06/2022   Atrial fibrillation 10/11/2021   Chronic kidney disease, stage 3 unspecified 10/11/2021   Obesity 10/11/2021   Erectile dysfunction 10/11/2021   Primary open angle glaucoma (POAG) of left eye, moderate stage 10/11/2021   Primary open-angle glaucoma, right eye, mild stage 10/11/2021   Tremor 10/11/2021   Type II diabetes mellitus 10/11/2021   Cholecystitis 09/01/2021   Glaucoma 08/31/2021   Arrhythmia    Dizzy spells 03/08/2020   Sinus bradycardia 03/08/2020   Essential hypertension 06/14/2015   Hyperlipidemia 06/14/2015    ONSET DATE: 06/19/2023  REFERRING DIAG: G20.A1 (ICD-10-CM) - Parkinson's disease without dyskinesia or fluctuating manifestations (HCC)   THERAPY DIAG:  Other symptoms and signs involving the nervous system  Muscle weakness (generalized)  Unsteadiness on feet  Rationale for Evaluation and Treatment: Rehabilitation  SUBJECTIVE:                                                                                                                                                                                             SUBJECTIVE STATEMENT:  Saw the cardiologist yesterday and was found to have a-fib. Going to get an echocardiogram tomorrow and getting started on Eliquis. Wants to wait to next week to work on car transfers as it is rainy. Notes it is harder to get out of the passenger side due to there being a slope on the hill.   Pt  accompanied by:  Spouse, [name]  PERTINENT HISTORY: PMH: PD,  A fb, stage 3 CKD, HLD, HTN, multiple lacunar infarcts, obesity, type II diabetes   Per Dr. Arbutus Leas:  Idiopathic Parkinson's disease, moderate             -Diagnosis was recently made, but likely has had this for a number of years  PAIN:  Are you having pain? No  Vitals:   08/27/23 1351  BP: (!) 104/56  Pulse: 68     PRECAUTIONS: Fall  RED FLAGS: None   WEIGHT BEARING RESTRICTIONS: No  FALLS: Has patient fallen in last 6 months? No  LIVING ENVIRONMENT: Lives with: lives with their spouse Lives in: House/apartment Stairs: Yes: External: 3 steps; bilateral but cannot reach both Has following equipment at home: None Has trouble getting up from sitting in the bathtub   PLOF: Independent Sometimes needs help with buttoning   PATIENT GOALS: Slow down the progression, maintain as much as possible.   OBJECTIVE:  Note: Objective measures were completed at Evaluation unless otherwise noted.  COGNITION: Overall cognitive status: Within functional limits for tasks assessed and Impaired Neurocognitive testing in February, 2024 with evidence of mild cognitive impairment. This was largely stable compared to prior testing with Dr. Roseanne Reno in 2021.    SENSATION: Light touch: WFL Pt reports sometimes numbness/tingling when getting up in the morning   COORDINATION: Heel to shin: WNL   MUSCLE TONE: Noted more stiffness in LLE    POSTURE: rounded shoulders and forward head  RUE resting tremors   LOWER EXTREMITY ROM:     Noted slightly decr R knee extension AROM   LOWER EXTREMITY MMT:    MMT Right Eval Left Eval  Hip flexion 5 5  Hip extension    Hip abduction    Hip adduction    Hip internal rotation    Hip external rotation    Knee flexion 5 5  Knee extension 5 5  Ankle dorsiflexion 5 5  Ankle plantarflexion    Ankle inversion  Ankle eversion    (Blank rows = not tested)  BED MOBILITY:  Pt  reports no difficulty   TRANSFERS: Assistive device utilized: None  Sit to stand: SBA Stand to sit: SBA Performs with no UE support   Pt notes difficulty getting in and out of the car, with bringing the legs in.   Pt reports difficulty getting up off the couch, sometimes will take a couple of tries.    FUNCTIONAL TESTS:  5 times sit to stand: 14 seconds with no UE support  10 meter walk test: 10.91 seconds = 3.01 ft/sec    TODAY'S TREATMENT:                                                                                                                                NMR:  Pt performs PWR! Moves in Standing position on blue mat with chair in front as needed for balance    PWR! Up for improved posture 10 reps, plus an additional 10 reps with pt raising heels off the mat for balance   PWR! Rock for improved weight shifting 10 reps, plus an additional 10 reps with lifting contralateral leg for dynamic SLS, cues to look up at hands   PWR! Twist for improved trunk rotation 10 reps   PWR! Step for improved step initiation with 4" obstacles to each side for improved foot clearance 20 reps   Cues provided for larger amplitude movements and foot clearance   From 14.5" box with air ex on top, 5 reps sit <> stands with no UE support, performed just from 14.5" box 10 reps with no UE support with pt demonstrating good technique each time, pt reporting RPE as 8/10. Cues for tall posture once in standing  With 6 stepping stones next to countertop, working on SLS down and back x3 reps (cued to hold march position for longer for SLS), performed another 2 reps down and back with trying to add in coordinated UE arm swing, pt more challenged with this for balance and coordination, with tendency to lift up ipsilateral arm instead of contralateral, CGA needed for balance Working on SLS, holding 4# medicine ball and tossing it to ground and then catching it 3 sets of 7-8 reps each side. CGA for balance.  Pt having more difficulty with SLS tasks on RLE, but did improve with incr reps. Pt needing to tap RLE down frequently to floor for balance  Holding 5# kettlebell over head and performing alternating marching 2 x 10 reps with RUE and 10 reps holding in LUE. Pt having more difficulty keeping it lifted with RUE, cues throughout for elbow extension   During session, cues needed for RUE arm swing during gait   PATIENT EDUCATION: Education details: Continue HEP  Person educated: Patient and Spouse Education method: Explanation, Demonstration, Verbal cues, and Handouts Education comprehension: verbalized understanding, returned demonstration, and needs further education  HOME EXERCISE PROGRAM: Standing PWR moves  Forward/backward stepping  Access Code: [email or web address] URL: [email or web address] Date: 07/30/2023 Prepared by: Sherlie Ban  Exercises - Staggered Stance Weight Shift with Arms Reaching  - 1-2 x daily - 5 x weekly - 2 sets - 10 reps  GOALS: Goals reviewed with patient? Yes  SHORT TERM GOALS:  Target date: 08/06/2023  Pt will be independent with initial HEP for PD specific deficits in order to build upon functional gains made in therapy. Baseline: Goal status: MET  2.  Pt will perform posterior stepping strategy in 3 steps or less in order to demo improved balance strategies.  Baseline: at least 5 steps, min A for balance   4-5 small steps with min A from therapist for balance (10/30) Goal status: NOT MET  3.  Pt will improve 5x sit<>stand to less than or equal to 12 sec to demonstrate improved functional strength and transfer efficiency.   Baseline: 14 seconds with no UE support  11.3 seconds (10/30) Goal status: MET   LONG TERM GOALS: Target date: 09/03/2023   Pt will be independent with final  HEP for PD specific deficits in order to build upon functional gains made in therapy. Baseline:  Goal status: INITIAL  2.  Pt will improve miniBEST to at  least a 22/28 in order to demo decr fall risk Baseline: 17/28 Goal status: INITIAL  3.  Pt will verbalize understanding of local Parkinson's disease resources, including options for continue community fitness.  Baseline:  Goal status: INITIAL  4.  Pt will improve TUG and TUG cog score to less than or equal to 10% difference for improved dual task/decreased fall risk.  Baseline:  Goal status: INITIAL  5.  Pt will get up from lower/softer surfaces to mimic a couch/recliner mod I in order to demo improved transfers  Baseline:  Goal status: INITIAL    ASSESSMENT:  CLINICAL IMPRESSION:  Deferred working on car transfers today due to the weather being wet outside. Initiated session with standing PWR moves on compliant surfaces (pt requesting for a review). Intermittent cues throughout for elbow extension with RUE and with looking at hands throughout movement. Pt did well with getting up from lower surfaces with mod I, with pt able to stand on first attempt. Remainder of session focused on working on SLS tasks and UE coordination. Pt is challenged by SLS tasks on RLE due to his PD. Pt is progressing well, will continue per POC.    OBJECTIVE IMPAIRMENTS: Abnormal gait, decreased activity tolerance, decreased balance, decreased cognition, decreased coordination, decreased mobility, difficulty walking, decreased strength, impaired flexibility, impaired UE functional use, and postural dysfunction.   ACTIVITY LIMITATIONS: carrying, bending, stairs, transfers, dressing, and locomotion level  PARTICIPATION LIMITATIONS: community activity  PERSONAL FACTORS: Age, Behavior pattern, Past/current experiences, Time since onset of injury/illness/exacerbation, and 3+ comorbidities:  PD,  A fb, stage 3 CKD, HLD, HTN, multiple lacunar infarcts, obesity, type II diabetes   are also affecting patient's functional outcome.   REHAB POTENTIAL: Good  CLINICAL DECISION MAKING: Evolving/moderate  complexity  EVALUATION COMPLEXITY: Moderate  PLAN:  PT FREQUENCY: 2x/week  PT DURATION: 8 weeks  PLANNED INTERVENTIONS: Therapeutic exercises, Therapeutic activity, Neuromuscular re-education, Balance training, Gait training, Patient/Family education, Self Care, Joint mobilization, Stair training, DME instructions, and Re-evaluation  PLAN FOR NEXT SESSION: work on SLS, stepping strategies, larger amplitude movement patterns . SciFit for reciprocal movement, endurance. Gait exercises with arm swing, Posterior balance, could trial resisted gait outdoors on uneven surface,  look at car transfers on Wednesday  Check goals next week and plan for D/C with return screens in 6 monthS?   Drake Leach, PT, DPT 08/27/2023, 2:02 PM

## 2023-08-28 ENCOUNTER — Ambulatory Visit (HOSPITAL_BASED_OUTPATIENT_CLINIC_OR_DEPARTMENT_OTHER)
Admission: RE | Admit: 2023-08-28 | Discharge: 2023-08-28 | Disposition: A | Discharge status: Home / Self Care | Payer: Medicare Other | Source: Ambulatory Visit | Attending: Cardiology | Admitting: Cardiology

## 2023-08-28 DIAGNOSIS — I4891 Unspecified atrial fibrillation: Secondary | ICD-10-CM | POA: Insufficient documentation

## 2023-08-28 LAB — ECHOCARDIOGRAM COMPLETE
AR max vel: 2.04 cm2
AV Area VTI: 2.09 cm2
AV Area mean vel: 2.1 cm2
AV Mean grad: 3 mm[Hg]
AV Peak grad: 6.3 mm[Hg]
AV Vena cont: 0.2 cm
Ao pk vel: 1.26 m/s
Area-P 1/2: 3.52 cm2
Calc EF: 57.2 %
MV M vel: 3.8 m/s
MV Peak grad: 57.6 mm[Hg]
P 1/2 time: 597 ms
S' Lateral: 2.6 cm
Single Plane A2C EF: 57.3 %
Single Plane A4C EF: 57.5 %

## 2023-09-01 ENCOUNTER — Ambulatory Visit: Payer: Medicare Other | Admitting: Occupational Therapy

## 2023-09-01 ENCOUNTER — Encounter: Payer: Self-pay | Admitting: Occupational Therapy

## 2023-09-01 ENCOUNTER — Encounter: Payer: Self-pay | Admitting: Physical Therapy

## 2023-09-01 ENCOUNTER — Ambulatory Visit: Payer: Medicare Other | Admitting: Physical Therapy

## 2023-09-01 ENCOUNTER — Ambulatory Visit: Payer: Medicare Other | Admitting: Speech Pathology

## 2023-09-01 ENCOUNTER — Encounter: Payer: Self-pay | Admitting: Speech Pathology

## 2023-09-01 ENCOUNTER — Telehealth: Payer: Self-pay

## 2023-09-01 VITALS — BP 116/60 | HR 75

## 2023-09-01 DIAGNOSIS — R2681 Unsteadiness on feet: Secondary | ICD-10-CM

## 2023-09-01 DIAGNOSIS — M6281 Muscle weakness (generalized): Secondary | ICD-10-CM

## 2023-09-01 DIAGNOSIS — R471 Dysarthria and anarthria: Secondary | ICD-10-CM | POA: Diagnosis not present

## 2023-09-01 DIAGNOSIS — R29818 Other symptoms and signs involving the nervous system: Secondary | ICD-10-CM

## 2023-09-01 DIAGNOSIS — R278 Other lack of coordination: Secondary | ICD-10-CM

## 2023-09-01 DIAGNOSIS — R29898 Other symptoms and signs involving the musculoskeletal system: Secondary | ICD-10-CM

## 2023-09-01 NOTE — Telephone Encounter (Signed)
No vm

## 2023-09-01 NOTE — Therapy (Signed)
OUTPATIENT SPEECH LANGUAGE PATHOLOGY PARKINSON'S TREATMENT   Patient Name: Joseph Lamb MRN: [id number] DOB:1951-02-01, 72 y.o., male Today's Date: 09/01/2023  PCP: Lupita Raider MD REFERRING PROVIDER: Vladimir Faster, DO  END OF SESSION:  End of Session - 09/01/23 0934     Visit Number 12    Number of Visits 17    Date for SLP Re-Evaluation 09/03/23    SLP Start Time 0930    SLP Stop Time  1015    SLP Time Calculation (min) 45 min    Activity Tolerance Patient tolerated treatment well               Past Medical History:  Diagnosis Date   Arrhythmia    Atrial fibrillation 10/11/2021   Cerebrovascular disease    moderate per 2021 MRI   Cholecystitis 09/01/2021   Chronic kidney disease, stage 3 unspecified 10/11/2021   Dizzy spells 03/08/2020   Erectile dysfunction 10/11/2021   Essential hypertension 06/14/2015   Essential tremor    Glaucoma 08/31/2021   Hyperlipidemia 06/14/2015   Irregular heart beat    Mild cognitive impairment of uncertain or unknown etiology 05/21/2023   Mixed hyperlipidemia    Multiple lacunar infarcts    bilateral centrum semiovale and right parietal cortex.   Obesity 10/11/2021   Obstructive sleep apnea    no CPAP   Other male erectile dysfunction    Primary open angle glaucoma (POAG) of left eye, moderate stage 10/11/2021   Primary open-angle glaucoma, right eye, mild stage 10/11/2021   Renal insufficiency 09/06/2022   Sinus bradycardia 03/08/2020   Tremor 10/11/2021   Type 2 diabetes mellitus with other specified complication (HCC)    Type II diabetes mellitus 10/11/2021   Vascular dementia [facility])    Past Surgical History:  Procedure Laterality Date   CHOLECYSTECTOMY N/A 08/31/2021   Procedure: LAPAROSCOPIC CHOLECYSTECTOMY;  Surgeon: Quentin Ore, MD;  Location: [facility] OR;  Service: General;  Laterality: N/A;   EYE SURGERY     Glaucoma surgery   PROSTATE SURGERY     Patient Active Problem List   Diagnosis Date  Noted   Essential tremor    Mixed hyperlipidemia    Irregular heart beat    Other male erectile dysfunction    Type 2 diabetes mellitus with other specified complication (HCC)    Vascular dementia (HCC)    Mild cognitive impairment of uncertain or unknown etiology 05/21/2023   Obstructive sleep apnea    Multiple lacunar infarcts    Cerebrovascular disease    Renal insufficiency 09/06/2022   Atrial fibrillation 10/11/2021   Chronic kidney disease, stage 3 unspecified 10/11/2021   Obesity 10/11/2021   Erectile dysfunction 10/11/2021   Primary open angle glaucoma (POAG) of left eye, moderate stage 10/11/2021   Primary open-angle glaucoma, right eye, mild stage 10/11/2021   Tremor 10/11/2021   Type II diabetes mellitus 10/11/2021   Cholecystitis 09/01/2021   Glaucoma 08/31/2021   Arrhythmia    Dizzy spells 03/08/2020   Sinus bradycardia 03/08/2020   Essential hypertension 06/14/2015   Hyperlipidemia 06/14/2015    ONSET DATE: 06/19/2023(referral date)  REFERRING DIAG:  Diagnosis  G20.A1 (ICD-10-CM) - Parkinson's disease 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 Don Perking consult: [name] was seen in the movement disorders clinic for neurologic  consultation at the request of Rosann Auerbach, PhD.  The consultation is for the evaluation of tremor and Parkinson's.  Pt with wife who supplements hx.  Patient has been seen in our clinic for a long time.  He was first seen by Dr. Karel Jarvis in 2016 and Dr. Marjory Lies prior to that.  Initial evaluation for his further evaluation of memory change, but at that point in time his MoCA was 29/30.  He came back for further evaluation in 2021.  In the June, 2021 evaluation, it is noted that patient  had begun to have hand tremors for 5 months prior.  At that point in time, Dr. Karel Jarvis did not see much on the examination, however.  By August, 2022, Dr. Karel Jarvis did note occasional right thumb resting tremor and mild cogwheeling in the office.  The following visits were with Marlowe Kays.  With time, she began to note right greater than left tremors at rest, mouth tremor, sialorrhea and increased tone.  He was started on levodopa by her in February, 2024.  He apparently discontinued it due to the fact that he felt his heart was fluttering and he d/c the medication.  .he thinks that he took it for a months.  He states that mostly he noted no difference in tremor so he d/c it.  he saw Dr. Milbert Coulter for memory testing in August, 2024, demonstrating mild cognitive impairment.  PAIN:  Are you having pain? No  FALLS: Has patient fallen in last 6 months?  See PT evaluation for details  LIVING ENVIRONMENT: Lives with: lives with their spouse and lives with their son Lives in: House/apartment  PLOF:  Level of assistance: Independent with ADLs, Independent with IADLs Employment: Retired  PATIENT GOALS: "To talk better"  OBJECTIVE:   TODAY'S TREATMENT:                                                                                                                                         DATE:   09/01/23: HEP log completed twice daily for 6/7 days. Trine is primarily using the on line Speak Out! Sessions with success. Targeted independence in HEP for upcoming d/c. Completed Speak Out! E library "Going out - grocery store" - Gable hit dB targets with mod I for all exercises. In conversation. He maintained 70dB with supervision cues. Bashir Id'd when his voice "trailed off" with mod I 2x and self corrected.   08/27/23: HEP log continues to be filled out indicating twice daily completion of Speak Out! Targeted intelligibility and volume speaking with intent using Speak Out! eLibrary lesson November lesson 4. Aja  achieved target dB levels on all structured tasks. In conversation Arrick maintained 72dB average with rare min A. Communicative Effectiveness Survey completed by spouse pre and post improved from 19/30 to 26/30. He improved in all items except being heard in a noisy environment.   11/18/24[name] returns with  HEP log completed - reviewed need to complete full lesson, not just the warm up, ah, glides and counting. Today, we completed online E-library lesson - November Lesson 3 in Speak Out! He is achieving all dB targets. In cognitive task naming steps in tasks - He generated 4-5 steps  averaging 73dB with mod I with very familiar tasks (baking sweet potato pie and changing oil) and required occasional to usual min verbal and gesture cues to maintain 70dB in less familiar tasks due to increased cognitive load. In conversation, Jerome required occasional min A to maintain 70-72dB and use intent. Son plans to attend next session. Instructed him to set up a Speak Out! Account to access [employer].   08/18/23: [name] has not filled out HEP log nor completed HEP - he is on Lesson 16 on Speak out! Reviewed requirement to complete lessons twice daily while receiving ST then 1x a day after d/c. Spouse aware to check his HEP log. I requested that his son attend next session to provide another support person to help complete HEP. Speak Out! Lesson 16 with supervision cues to complete accurately and with specified dB level. He did complete an on line session 1x yesterday. In conversation Rodgers required occasional min A to maintain 70dB. Ongoing encouragement to complete HEP more consistently  08/13/23: [name] enters with WNL, he filled out exercise for today prior to completing HEP twice. Instructed him to not fill out the log until AFTER he completes the lesson. Speak out! Lesson 15 completed with supervisions cues to complete each exercise accurately and hits dB targets. In simple conversation, Chaynce required occasional min  verbal cues and gestures to average 70-72dB over 12 minutes.    11/4/24[name] enters with WNL volume. He reports consistent completion of Speak Out! Twice daily and is on Lesson 13. He has used daily HEP log to track his twice daily progress. He completed Speak Out! Lesson 13 hitting all dB targets and completing each section accurately. He self corrected when he spoke in between exercises without intent. We role played placing orders at [employer] and giving/spelling his name, birth date,  address and phone numbers at the doctor's office. Mavrik maintained 72+dB in these with supervision cues. In  15 minute conversation re: controversial political issues, Sashank maintained 70-73dB with occasional min visual and gesture cues. He is to complete 1 on line session by next Monday 08/18/23.   08/06/23: [name] did not complete SpeakOut! Yesterday. He said he took the day off. I generated a log to track his twice daily and the lesson he has just completed. He completed Speak Out! Lesson 9 hitting all dB targets with supervision cues. In conversation over 15 minutes with prompts, then spontaneous, Shayon averaged 70dB with rare min A.   0/28/24: [name] reports consistently completing each lesson in Speak Out! Workbook twice a day. He enters knowing that he is on Lesson 9. Today we used an Theatre stage manager from Speak Out! eLibrary (November week 1) [name] required occasional min verbal and visual cues to hold Ah for 10 seconds. He hit all dB targets in the lesson with rare min A (occasional min A for conversation exercises - divergent naming) In simple conversation Aldridge required occasional min verbal and gesture cues to maintain 72dB - volume decreases and cognitive load increases - Emir verbalizes awareness of slow processing. Education provided for ways family members can assist him in participating in conversations with small groups of family. He told his other son  about his PD - they endorsed that he  will watch the "what is Parkinson's" webinar on speak out! Website.   10/23/24[name] reports completing Speak Out! 1x - continue to require him to complete this twice a day. He self correct intent voice 3x with supervision cues in initial conversation.  Targeted volume and intelligibility using Speak Out! Lesson 6. Pt required rare min  verbal cues, modeling, for volume and breath support. Pt averages the following volume levels:  Sustained AH: 93 dB  Counting:87  Reading (phrases): 87dB  Cognitive Exercise: 75dB Required usual mod verbal cues, modeling for carryover of intent /volume completing and explaining idioms   07/28/23: Darwin reports completing Speak Out! Through lesson 5, however he did not complete each lesson twice. I reviewed requirement to complete each lesson 2x, twice a day. We completed Lesson 5 today.  Targeted volume and intelligibility using Speak Out! Lesson 5. Pt required occasional min  verbal cues, modeling, for volume and breath support.  Pt averages the following volume levels:  Sustained AH: 90dB  Counting:84dB  Reading (phrases): 80dB  Cognitive Exercise: 78dB Required usual min to mod verbal cues, modeling for carryover of intent /volume on divergent naming task. Usual min semantic cues to generate 5-8 items in simple categories. In simple description re: holiday traditions, Bladen averaged 72dB with usual mod modeling, verbal and gesture cues.    07/23/23: Decarlos brings Speak Out! Workbook to the session. He and [name] have watched the Learn About Parkinson's webinar on [employer]. Instructed them to have their son watch it as well. Marcal endorses "I have been speaking low for a long time, I didn't realize it"  Previewed the workbook with them, introduced requirement of twice daily practice, advancing lessons after 2 sessions. Demonstrated online Speak Out! Sessions. Targeted volume and intelligibility using Speak Out! Lesson 1. Pt required  usual to  occasional min verbal cues and modeling, for volume and breath support.  Pt averages the following volume levels  Sustained AH: 85dB  Counting:85dB  Reading (phrases): 75dB  Cognitive Exercise: 72dB Required usual mod verbal cues, modeling for carryover of intent /volume generating 3 sentence descriptions of basic objects.  following structured practice. In simple conversation over 5 minutes, Breylin averaged 70dB with usual mod visual and verbal cues.    07/09/23 (eval day): Introduced [employer]! Workbook to [employer]. Ordered work book - Introduced them to [employer], demonstrated location of Learn about [employer] and on Physiological scientist. Instructed them to watch webinar and an online practice session and have their 2 sons watch the webinar as well. Educated re: Automatic vs Intentional motor systems. [name] Id'd when he was speaking or reading with intent vs not using intent. By the end of the session, he was conversing with intent to average 70dB with occasional min a  PATIENT EDUCATION: Education details: See Today's Treatment; See patient instructions; HEP for dysarthria; PD education Person educated: Patient and Spouse Education method: Explanation, Demonstration, Verbal cues, and Handouts Education comprehension: verbalized understanding, returned demonstration, verbal cues required, and needs further education  HOME EXERCISE PROGRAM: Speak Out! Workbook ordered - they are to bring it to ST when it arrives   GOALS: Goals reviewed with patient? Yes  SHORT TERM GOALS: Target date: 08/06/23  Pt will complete HEP hitting target dB's with occasional min A over 1 week Baseline: Goal status: MET  2.  Pt will complete HEP twice a day, completing at least 1 on line  session with Parkinson [employer] website Baseline:  Goal status: MET  3.  Pt will averge 72dB 18/20 sentences with rare min A Baseline:  Goal status: MET  4.  Pt will  average 72dB over 12 minute converation with rare min A Baseline:  Goal status: MET  5.  Pt and his family will watch Learn about Parkinson's webinar on [employer] website Baseline:  Goal status: MET              LONG TERM GOALS: Target date: 09/03/23  Pt will complete HEP for dysarthria twice daily accurately over 1 week with mod I Baseline:  Goal status:MET   2.  Pt will complete 2 on line practice sessions from [employer] online Baseline:  Goal status: MET  3.  Pt will maintain 70dB over 15 minute conversation with mod I Baseline:  Goal status: ONGOING  4.  Pt will improve score on Communicative Effectiveness Survey by 3 points Baseline: 19 - spouse rated due to reduced pt awareness Goal status: ONGOING  5.  Pt/spouse will verbalize requirement to continue Speak Out! Daily practice with workbook and online up d/c of ST Baseline:  Goal status: ONGOING   ASSESSMENT:  CLINICAL IMPRESSION: Patient is a 72 y.o. 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 He is accompanied by his spouse, Teryl Lucy. Orville is completing HEP Speak Out! Program accurately, however he is not completing daily lessons. I provided a log which has not been filled out. Spouse instructed to A Mackay with remembering his daily practice and log. He continues to require occasional min to mod A to maintain volume in conversation and with mild cognitive load. [name] does report overall improved intelligibility at home.  I recommend skilled ST to maximize intelligibility for independence, QOL, reduce caregiver burden.   OBJECTIVE IMPAIRMENTS: Objective impairments include memory and dysarthria. These impairments are limiting patient from effectively communicating at home and in community.Factors affecting potential to achieve goals and functional outcome are medical prognosis.. Patient will benefit from skilled SLP services to address above impairments and improve  overall function.  REHAB POTENTIAL: Good  PLAN:  SLP FREQUENCY: 2x/week  SLP DURATION: 8 weeks  PLANNED INTERVENTIONS: Aspiration precaution training, Diet toleration management , Environmental controls, Cueing hierachy, Cognitive reorganization, Internal/external aids, Functional tasks, Multimodal communication approach, and SLP instruction and feedback MBSS  Liyah Higham, Radene Journey, CCC-SLP 09/01/2023, 10:18 AM

## 2023-09-01 NOTE — Telephone Encounter (Signed)
-----   Message from Garwin Brothers sent at 08/28/2023  5:37 PM EST ----- The results of the study is unremarkable. Please inform patient. I will discuss in detail at next appointment. Cc  primary care/referring physician Garwin Brothers, MD 08/28/2023 5:37 PM

## 2023-09-01 NOTE — Patient Instructions (Signed)
Ways to prevent future Parkinson's related complications:  1.   Exercise regularly/daily.    2.   Focus on BIGGER movements during daily activities- really reach overhead, straighten elbows and extend fingers  3.   When dressing (especially jacket/coat) or reaching for your seatbelt make sure to use your body to assist by twisting while you reach and looking at where you are reaching - this can help to minimize stress on the shoulder and reduce the risk of a rotator cuff tear  4.   Swing your arms when you walk (unless using walker)! People with PD are at increased risk for frozen shoulder and swinging your arms can reduce this risk.  5. Drink plenty of water and eat a high fiber diet (fresh fruits and veggies, nuts/seeds, fish). Avoid canned foods, red meats, and dairy when possible  6. Do NOT take your Parkinson's medication around meals (avoid taking 30 min before a meal, or 1 hour after a meal), especially protein as it blocks the absorption of the medicine and will not work effectively  7. Keep your feet apart when you are standing (wider stance) to allow you to have better balance and to reach further with your arms. Also make sure your feet are apart before standing up.

## 2023-09-01 NOTE — Therapy (Signed)
OUTPATIENT PHYSICAL THERAPY NEURO TREATMENT   Patient Name: Joseph Lamb MRN: [id number] DOB:October 31, 1950, 72 y.o., male Today's Date: 09/01/2023   PCP: Lupita Raider, MD  REFERRING PROVIDER:  Vladimir Faster, DO  END OF SESSION:  PT End of Session - 09/01/23 1105     Visit Number 12    Number of Visits 13    Date for PT Re-Evaluation 09/07/23    Authorization Type [employer]    PT Start Time 1105   handoff with OT   PT Stop Time 1145    PT Time Calculation (min) 40 min    Equipment Utilized During Treatment Gait belt    Activity Tolerance Patient tolerated treatment well    Behavior During Therapy WFL for tasks assessed/performed              Past Medical History:  Diagnosis Date   Arrhythmia    Atrial fibrillation 10/11/2021   Cerebrovascular disease    moderate per 2021 MRI   Cholecystitis 09/01/2021   Chronic kidney disease, stage 3 unspecified 10/11/2021   Dizzy spells 03/08/2020   Erectile dysfunction 10/11/2021   Essential hypertension 06/14/2015   Essential tremor    Glaucoma 08/31/2021   Hyperlipidemia 06/14/2015   Irregular heart beat    Mild cognitive impairment of uncertain or unknown etiology 05/21/2023   Mixed hyperlipidemia    Multiple lacunar infarcts    bilateral centrum semiovale and right parietal cortex.   Obesity 10/11/2021   Obstructive sleep apnea    no CPAP   Other male erectile dysfunction    Primary open angle glaucoma (POAG) of left eye, moderate stage 10/11/2021   Primary open-angle glaucoma, right eye, mild stage 10/11/2021   Renal insufficiency 09/06/2022   Sinus bradycardia 03/08/2020   Tremor 10/11/2021   Type 2 diabetes mellitus with other specified complication (HCC)    Type II diabetes mellitus 10/11/2021   Vascular dementia [facility])    Past Surgical History:  Procedure Laterality Date   CHOLECYSTECTOMY N/A 08/31/2021   Procedure: LAPAROSCOPIC CHOLECYSTECTOMY;  Surgeon: Quentin Ore, MD;  Location: [facility]  OR;  Service: General;  Laterality: N/A;   EYE SURGERY     Glaucoma surgery   PROSTATE SURGERY     Patient Active Problem List   Diagnosis Date Noted   Essential tremor    Mixed hyperlipidemia    Irregular heart beat    Other male erectile dysfunction    Type 2 diabetes mellitus with other specified complication (HCC)    Vascular dementia (HCC)    Mild cognitive impairment of uncertain or unknown etiology 05/21/2023   Obstructive sleep apnea    Multiple lacunar infarcts    Cerebrovascular disease    Renal insufficiency 09/06/2022   Atrial fibrillation 10/11/2021   Chronic kidney disease, stage 3 unspecified 10/11/2021   Obesity 10/11/2021   Erectile dysfunction 10/11/2021   Primary open angle glaucoma (POAG) of left eye, moderate stage 10/11/2021   Primary open-angle glaucoma, right eye, mild stage 10/11/2021   Tremor 10/11/2021   Type II diabetes mellitus 10/11/2021   Cholecystitis 09/01/2021   Glaucoma 08/31/2021   Arrhythmia    Dizzy spells 03/08/2020   Sinus bradycardia 03/08/2020   Essential hypertension 06/14/2015   Hyperlipidemia 06/14/2015    ONSET DATE: 06/19/2023  REFERRING DIAG: G20.A1 (ICD-10-CM) - Parkinson's disease without dyskinesia or fluctuating manifestations (HCC)   THERAPY DIAG:  Other symptoms and signs involving the nervous system  Muscle weakness (generalized)  Unsteadiness on feet  Rationale for  Evaluation and Treatment: Rehabilitation  SUBJECTIVE:                                                                                                                                                                                             SUBJECTIVE STATEMENT:  No falls, no changes. Got his echocardiogram last week, have not yet gotten the results. Reports last couple of days BP has been fine.   Pt accompanied by:  Spouse, Joseph Lamb  PERTINENT HISTORY: PMH: PD,  A fb, stage 3 CKD, HLD, HTN, multiple lacunar infarcts, obesity, type II diabetes    Per Dr. Arbutus Leas:  Idiopathic Parkinson's disease, moderate             -Diagnosis was recently made, but likely has had this for a number of years  PAIN:  Are you having pain? No  Vitals:   09/01/23 1111  BP: 116/60  Pulse: 75    PRECAUTIONS: Fall  RED FLAGS: None   WEIGHT BEARING RESTRICTIONS: No  FALLS: Has patient fallen in last 6 months? No  LIVING ENVIRONMENT: Lives with: lives with their spouse Lives in: House/apartment Stairs: Yes: External: 3 steps; bilateral but cannot reach both Has following equipment at home: None Has trouble getting up from sitting in the bathtub   PLOF: Independent Sometimes needs help with buttoning   PATIENT GOALS: Slow down the progression, maintain as much as possible.   OBJECTIVE:  Note: Objective measures were completed at Evaluation unless otherwise noted.  COGNITION: Overall cognitive status: Within functional limits for tasks assessed and Impaired Neurocognitive testing in February, 2024 with evidence of mild cognitive impairment. This was largely stable compared to prior testing with Dr. Roseanne Reno in 2021.    SENSATION: Light touch: WFL Pt reports sometimes numbness/tingling when getting up in the morning   COORDINATION: Heel to shin: WNL   MUSCLE TONE: Noted more stiffness in LLE    POSTURE: rounded shoulders and forward head  RUE resting tremors   LOWER EXTREMITY ROM:     Noted slightly decr R knee extension AROM   LOWER EXTREMITY MMT:    MMT Right Eval Left Eval  Hip flexion 5 5  Hip extension    Hip abduction    Hip adduction    Hip internal rotation    Hip external rotation    Knee flexion 5 5  Knee extension 5 5  Ankle dorsiflexion 5 5  Ankle plantarflexion    Ankle inversion    Ankle eversion    (Blank rows = not tested)  BED MOBILITY:  Pt reports no difficulty   TRANSFERS: Assistive device utilized: None  Sit to stand: SBA Stand to sit: SBA Performs with no UE support   Pt notes  difficulty getting in and out of the car, with bringing the legs in.   Pt reports difficulty getting up off the couch, sometimes will take a couple of tries.    FUNCTIONAL TESTS:  5 times sit to stand: 14 seconds with no UE support  10 meter walk test: 10.91 seconds = 3.01 ft/sec    TODAY'S TREATMENT:                                                                                                                               Therapeutic Activity:  Vitals:   09/01/23 1111  BP: 116/60  Pulse: 75     [facility] PT Assessment - 09/01/23 1112       Mini-BESTest   Sit To Stand Normal: Comes to stand without use of hands and stabilizes independently.    Rise to Toes Normal: Stable for 3 s with maximum height.    Stand on one leg (left) Normal: 20 s.    Stand on one leg (right) Moderate: < 20 s   11.5 seconds, 14 seconds   Stand on one leg - lowest score 1    Compensatory Stepping Correction - Forward Normal: Recovers independently with a single, large step (second realignement is allowed).    Compensatory Stepping Correction - Backward Moderate: More than one step is required to recover equilibrium    Compensatory Stepping Correction - Left Lateral Normal: Recovers independently with 1 step (crossover or lateral OK)    Compensatory Stepping Correction - Right Lateral Moderate: Several steps to recover equilibrium   2 steps   Stepping Corredtion Lateral - lowest score 1    Stance - Feet together, eyes open, firm surface  Normal: 30s    Stance - Feet together, eyes closed, foam surface  Normal: 30s    Incline - Eyes Closed Normal: Stands independently 30s and aligns with gravity    Change in Gait Speed Normal: Significantly changes walkling speed without imbalance    Walk with head turns - Horizontal Normal: performs head turns with no change in gait speed and good balance    Walk with pivot turns Normal: Turns with feet close FAST (< 3 steps) with good balance.    Step over obstacles  Moderate: Steps over box but touches box OR displays cautious behavior by slowing gait.    Timed UP & GO with Dual Task Moderate: Dual Task affects either counting OR walking (>10%) when compared to the TUG without Dual Task.    Mini-BEST total score 23      Timed Up and Go Test   Normal TUG (seconds) 7.7    Cognitive TUG (seconds) 12.2             NMR:  With rebounder: alternating forward step with tossing red ball and catching it, with cog dual task with pt naming  foods from A-Z, progressing challenge by pt standing on air ex and stepping on and off, pt slower with balance due to focusing on cognitive task, ~20 reps each side  Standing on air ex: alternating step up with contralateral march for dynamic SLS, x10 reps each side, adding in overhead reach with BUE with cues for elbow extension and opening up hands, pt with incr challenge with coordinating UE task    PATIENT EDUCATION: Education details: Continue HEP, result of miniBEST, plan for D/C at next session with return PD screens in 6 months   Person educated: Patient and Spouse Education method: Explanation, Demonstration, and Verbal cues Education comprehension: verbalized understanding, returned demonstration, and needs further education  HOME EXERCISE PROGRAM: Standing PWR moves  Forward/backward stepping  Access Code: [email or web address] URL: [email or web address] Date: 07/30/2023 Prepared by: Sherlie Ban  Exercises - Staggered Stance Weight Shift with Arms Reaching  - 1-2 x daily - 5 x weekly - 2 sets - 10 reps  GOALS: Goals reviewed with patient? Yes  SHORT TERM GOALS:  Target date: 08/06/2023  Pt will be independent with initial HEP for PD specific deficits in order to build upon functional gains made in therapy. Baseline: Goal status: MET  2.  Pt will perform posterior stepping strategy in 3 steps or less in order to demo improved balance strategies.  Baseline: at least 5 steps, min A for balance    4-5 small steps with min A from therapist for balance (10/30) Goal status: NOT MET  3.  Pt will improve 5x sit<>stand to less than or equal to 12 sec to demonstrate improved functional strength and transfer efficiency.   Baseline: 14 seconds with no UE support  11.3 seconds (10/30) Goal status: MET   LONG TERM GOALS: Target date: 09/03/2023   Pt will be independent with final  HEP for PD specific deficits in order to build upon functional gains made in therapy. Baseline:  Goal status: INITIAL  2.  Pt will improve miniBEST to at least a 22/28 in order to demo decr fall risk Baseline: 17/28  23/28 on 11/25 Goal status: MET  3.  Pt will verbalize understanding of local Parkinson's disease resources, including options for continue community fitness.  Baseline:  Goal status: INITIAL  4.  Pt will improve TUG and TUG cog score to less than or equal to 10% difference for improved dual task/decreased fall risk.  Baseline:   TUG: 7.7 seconds Cog TUG: 12.2 seconds Goal status: NOT MET  5.  Pt will get up from lower/softer surfaces to mimic a couch/recliner mod I in order to demo improved transfers  Baseline:  Goal status: INITIAL    ASSESSMENT:  CLINICAL IMPRESSION:  Today's skilled session focused on beginning to check LTGs for anticipated D/C. Assessed miniBEST with pt scoring a 23/28 and met LTG #2, decr pt's risk of falls. Pt did not meet LTG #4, in regards to TUG and cog TUG. Pt had an improvement, but not quite to goal level, indicating continued difficulty with dual tasking. Remainder of session focused on balance tasks working on step ups and alternating stepping with added UE coordination and cog dual tasking. Discussed D/C at next session with pt in agreement with plan. Will continue per POC.    OBJECTIVE IMPAIRMENTS: Abnormal gait, decreased activity tolerance, decreased balance, decreased cognition, decreased coordination, decreased mobility, difficulty walking,  decreased strength, impaired flexibility, impaired UE functional use, and postural dysfunction.   ACTIVITY LIMITATIONS: carrying, bending, stairs, transfers, dressing, and locomotion level  PARTICIPATION LIMITATIONS: community activity  PERSONAL FACTORS: Age, Behavior pattern, Past/current experiences, Time since onset of injury/illness/exacerbation, and 3+ comorbidities:  PD,  A fb, stage 3 CKD, HLD, HTN, multiple lacunar infarcts, obesity, type II diabetes   are also affecting patient's functional outcome.   REHAB POTENTIAL: Good  CLINICAL DECISION MAKING: Evolving/moderate complexity  EVALUATION COMPLEXITY: Moderate  PLAN:  PT FREQUENCY: 2x/week  PT DURATION: 8 weeks  PLANNED INTERVENTIONS: Therapeutic exercises, Therapeutic activity, Neuromuscular re-education, Balance training, Gait training, Patient/Family education, Self Care, Joint mobilization, Stair training, DME instructions, and Re-evaluation  PLAN FOR NEXT SESSION: plan for D/C, finalize exercise program, look at car transfers?   Drake Leach, PT, DPT 09/01/2023, 12:10 PM

## 2023-09-01 NOTE — Therapy (Signed)
OUTPATIENT OCCUPATIONAL THERAPY PARKINSON'S TREATMENT  Patient Name: Joseph Lamb MRN: [id number] DOB:17-Jan-1951, 72 y.o., male Today's Date: 09/01/2023  PCP: Lupita Raider, MD REFERRING PROVIDER: Vladimir Faster, DO  END OF SESSION:  OT End of Session - 09/01/23 1020     Visit Number 8    Number of Visits 20    Date for OT Re-Evaluation 10/03/23    Authorization Type [employer] Medicare 2024 10% coinsur NO Ded VL: [place] Auth Not Reqd    Progress Note Due on Visit 10    OT Start Time 1018    OT Stop Time 1100    OT Time Calculation (min) 42 min    Activity Tolerance Patient tolerated treatment well    Behavior During Therapy [facility] for tasks assessed/performed             Past Medical History:  Diagnosis Date   Arrhythmia    Atrial fibrillation 10/11/2021   Cerebrovascular disease    moderate per 2021 MRI   Cholecystitis 09/01/2021   Chronic kidney disease, stage 3 unspecified 10/11/2021   Dizzy spells 03/08/2020   Erectile dysfunction 10/11/2021   Essential hypertension 06/14/2015   Essential tremor    Glaucoma 08/31/2021   Hyperlipidemia 06/14/2015   Irregular heart beat    Mild cognitive impairment of uncertain or unknown etiology 05/21/2023   Mixed hyperlipidemia    Multiple lacunar infarcts    bilateral centrum semiovale and right parietal cortex.   Obesity 10/11/2021   Obstructive sleep apnea    no CPAP   Other male erectile dysfunction    Primary open angle glaucoma (POAG) of left eye, moderate stage 10/11/2021   Primary open-angle glaucoma, right eye, mild stage 10/11/2021   Renal insufficiency 09/06/2022   Sinus bradycardia 03/08/2020   Tremor 10/11/2021   Type 2 diabetes mellitus with other specified complication (HCC)    Type II diabetes mellitus 10/11/2021   Vascular dementia [facility])    Past Surgical History:  Procedure Laterality Date   CHOLECYSTECTOMY N/A 08/31/2021   Procedure: LAPAROSCOPIC CHOLECYSTECTOMY;  Surgeon: Quentin Ore, MD;   Location: [facility] OR;  Service: General;  Laterality: N/A;   EYE SURGERY     Glaucoma surgery   PROSTATE SURGERY     Patient Active Problem List   Diagnosis Date Noted   Essential tremor    Mixed hyperlipidemia    Irregular heart beat    Other male erectile dysfunction    Type 2 diabetes mellitus with other specified complication (HCC)    Vascular dementia (HCC)    Mild cognitive impairment of uncertain or unknown etiology 05/21/2023   Obstructive sleep apnea    Multiple lacunar infarcts    Cerebrovascular disease    Renal insufficiency 09/06/2022   Atrial fibrillation 10/11/2021   Chronic kidney disease, stage 3 unspecified 10/11/2021   Obesity 10/11/2021   Erectile dysfunction 10/11/2021   Primary open angle glaucoma (POAG) of left eye, moderate stage 10/11/2021   Primary open-angle glaucoma, right eye, mild stage 10/11/2021   Tremor 10/11/2021   Type II diabetes mellitus 10/11/2021   Cholecystitis 09/01/2021   Glaucoma 08/31/2021   Arrhythmia    Dizzy spells 03/08/2020   Sinus bradycardia 03/08/2020   Essential hypertension 06/14/2015   Hyperlipidemia 06/14/2015    ONSET DATE: 06/19/2023  REFERRING DIAG: G20.A1 (ICD-10-CM) - Parkinson's disease without dyskinesia or fluctuating manifestations  THERAPY DIAG:  Other symptoms and signs involving the nervous system  Muscle weakness (generalized)  Unsteadiness on feet  Other lack of  coordination  Other symptoms and signs involving the musculoskeletal system  Rationale for Evaluation and Treatment: Rehabilitation  SUBJECTIVE:   SUBJECTIVE STATEMENT: I like the PWR! Moves. No falls and no pain right now  Pt accompanied by: self   PERTINENT HISTORY:  PMH: PD (newly diagnosed), A fb, stage 3 CKD, HLD, HTN, multiple lacunar infarcts, obesity, type II diabetes  Re: Dr. Arbutus Leas Neuro appt: 06/19/2023 Consultation is for the evaluation of tremor and Parkinson's.  He was first seen by Dr. Karel Jarvis in 2016 and Dr. Marjory Lies  prior to that.  Initial evaluation for his further evaluation of memory change, but at that point in time his MoCA was 29/30.  He came back for further evaluation in 2021.  In the June, 2021, it was noted that patient had began to have hand tremors for 5 months prior.  At that point in time, Dr. Karel Jarvis did not see much on the examination, however.  By August, 2022, Dr. Karel Jarvis did note occasional right thumb resting tremor and mild cogwheeling in the office.  The following visits were with Marlowe Kays.  With time, she began to note right greater than left tremors at rest, mouth tremor, sialorrhea and increased tone.  He was started on levodopa by her in February, 2024.  He apparently discontinued it due to the fact that he felt his heart was fluttering and he d/c the medication. He states that mostly he noted no difference in tremor so he d/c it.  He saw Dr. Milbert Coulter for memory testing in August, 2024, demonstrating mild cognitive impairment.  Dr. Milbert Coulter referred him to Dr. Arbutus Leas for further evaluation.   PRECAUTIONS: None  WEIGHT BEARING RESTRICTIONS: No  PAIN:  Are you having pain? Yes: NPRS scale: 4/10 Pain location: R hand and shoulder & abdominal Pain description: gnawing pain Aggravating factors: When I get upset/tired, eat certain foods - hot/spicy ie) hamburgers, BBQ Relieving factors: Stop the shaking in R hand/arm, Tums  FALLS: Has patient fallen in last 6 months? No  LIVING ENVIRONMENT: Lives with: lives with their spouse and lives with their son Lives in: House/apartment Stairs: Yes: External: 3 steps; bilateral but cannot reach both Has following equipment at home: shower chair and Grab bars  PLOF: Independent Sometimes needs help with buttoning & tying laces  PATIENT GOALS: To help with stiffness and dexterity in his hands/arms ie) has/had trouble turning to reach to back seat of the car, trouble with some dressing/buttons and other ADLs compared to PLOF.  OBJECTIVE:  Note: Objective  measures were completed at Evaluation unless otherwise noted.  HAND DOMINANCE: Right  ADLs: Overall ADLs: Mod I Transfers/ambulation related to ADLs: Ind without AE at this time Eating: Does it himself, including cutting food. Grooming: "I do an adequate job." Re: shaving (only missing a little bit) UB Dressing: Wife may help to pull down his shirt in the back LB Dressing: Takes extra time to tie laces Toileting: Mod I Bathing: Decent job. 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 and they were bubble packed but thinks he'll have to  Financial management: Wife is the "  banker" Handwriting: 50% legible and Mild micrographia - see eval for sample writing  MOBILITY STATUS: Independent  POSTURE COMMENTS:  rounded shoulders and forward head  ACTIVITY TOLERANCE: Activity tolerance: Good  FUNCTIONAL OUTCOME MEASURES: Fastening/unfastening 3 buttons: 42.68 sec Physical performance test: PPT#2 (simulated eating) 15.1 sec & PPT#4 (donning/doffing jacket): 16.09 sec  UEFI = 62/80 (see specific areas in eval)  COORDINATION: 9 Hole Peg test: Right: 36.28  sec; Left: 36.4 sec Box and Blocks:  Right 47 blocks, Left 43 blocks Tremors: Resting and Right  UE ROM:  WFL - slight end range limitations  UE MMT:   WFL Grip Strength: Right 57.5, 71.4, 75.6 Average 68.2 lbs Left 55.9, 70.5, 83.3 Average 69.9 lbs  SENSATION: WFL  MUSCLE TONE: TBA  COGNITION: Overall cognitive status: Within functional limits for tasks assessed - See ST evaluation  OBSERVATIONS: Dystonia  TODAY'S  TREATMENT:                                                                                                                                Pt issued ways to prevent future related PD complications and reviewed extensively. Stressed importance of daily exercise to slow down progression of PD and recommended a combination of specific PWR! Ex's, stretches, cardio, and interval training (as recommended from the [employer]) and ways to get each of these through community classes. Also stressed nutrition and staying social.   Assessed remaining STG's - see below.  Pt maintaining 100% legibility and size writing name in cursive and short list in print  Discussed importance of looking with eyes over shoulder for functional tasks (putting on seatbelt, getting jacket on/off) to increase trunk rotation as pt is very stiff with trunk rotation which could lead to undue strain on shoulder and cause shoulder injuries. Practiced targeted looking for increased trunk rotation and recommended chair Yoga for stretching. Also reviewed doing PWR! Twist correctly seated for max benefit  PATIENT EDUCATION: Education details:see above Person educated: Patient and Spouse Education method: Explanation, Facilities manager, Verbal cues, and Handouts Education comprehension: verbalized understanding, returned demonstration, verbal cues required, and needs further education  HOME EXERCISE PROGRAM: 07/30/2023: PWR! Hands; BIG ADLs 08/06/23: handwriting strategies 08/11/2023: PWR! Sitting; [place] method 08/13/2023: Tremor reduction strategies 08/27/23: coordination HEP  09/01/23: ways to prevent future related PD complications  GOALS: Goals reviewed with patient? Yes  GOALS:  SHORT TERM GOALS: Target date: 08/27/2023    Pt will be independent with PD specific HEP.  Baseline: not yet initiated Goal status: MET (Except PWR! Supine)   2.  Pt will verbalize understanding of adapted strategies to maximize safety and  independence with ADLs/IADLs.  Baseline: not yet initiated Goal status: MET  3.  Pt will write his name/simple list with no significant decrease in size and maintain 75% legibility.  Baseline: 50% legibility Goal status: MET (at 100% - name in cursive, list in print)   4.  Pt will demonstrate improved fine  motor coordination for ADLs as evidenced by decreasing 9 hole peg test score for BUE and by 3-5 secs  Baseline: Right: 36.28  sec; Left: 36.4 sec Goal status: PARTIALLY MET (RT = 35 sec, Lt = 32 sec.)  5.  Pt will be able to place at least 3-5 additional blocks using B hand with completion of Box and Blocks test.  Baseline: Right 47 blocks, Left 43 blocks Goal status: NOT MET (RT = 45, Lt = 45)    LONG TERM GOALS: Target date: 10/10/2023    Pt will verbalize understanding of ways to prevent future PD related complications and PD community resources.  Baseline: not yet initiated Goal status: MET  2.   Pt will verbalize understanding of ways to keep thinking skills sharp and ways to compensate for STM changes in the future.  Baseline: not yet initiated Goal status: INITIAL  3.  Pt will demonstrate improved ease with feeding as evidenced by decreasing PPT#2 by 2-4 secs.  Baseline: 15.1 sec Goal status: INITIAL  4.  Pt will demonstrate increased ease with dressing as evidenced by decreasing PPT#4 (don/ doff jacket) by 2-4 secs or more.  Baseline: 16.09 sec 08/11/2023: 17 seconds with personal jacket Goal status: IN PROGRESS  5. Pt will demonstrate improved ease with fastening buttons as evidenced by decreasing 3 button/unbutton time by 2-5 seconds  Baseline: 42.68 sec seconds Goal status: INITIAL  ASSESSMENT:  CLINICAL IMPRESSION: Pt has met 4/5 STG's and 1 LTG at this time. Pt has already reported improvements in functional tasks with noted significant improvement in handwriting  PERFORMANCE DEFICITS: in functional skills including ADLs, IADLs, coordination,  dexterity, tone, ROM, strength, pain, flexibility, Fine motor control, Gross motor control, balance, endurance, decreased knowledge of precautions, decreased knowledge of use of DME, and UE functional use, cognitive skills including safety awareness, and psychosocial skills including coping strategies and environmental adaptation.   IMPAIRMENTS: are limiting patient from ADLs, IADLs, and leisure.   COMORBIDITIES:  may have co-morbidities  that affects occupational performance. Patient will benefit from skilled OT to address above impairments and improve overall function.  REHAB POTENTIAL: Excellent  PLAN:  OT FREQUENCY: 2x/week  OT DURATION: 10 weeks  PLANNED INTERVENTIONS: 97535 self care/ADL training, 40981 therapeutic exercise, 97530 therapeutic activity, 97112 neuromuscular re-education, functional mobility training, psychosocial skills training, energy conservation, coping strategies training, patient/family education, and DME and/or AE instructions  RECOMMENDED OTHER SERVICES: Pt received PT and ST evaluations already and appts in place for all 3 services  CONSULTED AND AGREED WITH PLAN OF CARE: Patient and family member/caregiver  PLAN FOR NEXT SESSION:   Practice buttons, PWR! Supine   Sheran Lawless, OT 09/01/2023, 10:21 AM

## 2023-09-03 ENCOUNTER — Encounter: Payer: Self-pay | Admitting: Physical Therapy

## 2023-09-03 ENCOUNTER — Ambulatory Visit: Payer: Medicare Other | Admitting: Speech Pathology

## 2023-09-03 ENCOUNTER — Ambulatory Visit: Payer: Medicare Other | Admitting: Physical Therapy

## 2023-09-03 ENCOUNTER — Encounter: Payer: Self-pay | Admitting: Speech Pathology

## 2023-09-03 ENCOUNTER — Ambulatory Visit: Payer: Medicare Other | Admitting: Occupational Therapy

## 2023-09-03 VITALS — BP 127/70 | HR 49

## 2023-09-03 DIAGNOSIS — R2681 Unsteadiness on feet: Secondary | ICD-10-CM

## 2023-09-03 DIAGNOSIS — R471 Dysarthria and anarthria: Secondary | ICD-10-CM | POA: Diagnosis not present

## 2023-09-03 DIAGNOSIS — R2689 Other abnormalities of gait and mobility: Secondary | ICD-10-CM

## 2023-09-03 DIAGNOSIS — M6281 Muscle weakness (generalized): Secondary | ICD-10-CM

## 2023-09-03 DIAGNOSIS — R29818 Other symptoms and signs involving the nervous system: Secondary | ICD-10-CM

## 2023-09-03 NOTE — Therapy (Signed)
OUTPATIENT SPEECH LANGUAGE PATHOLOGY PARKINSON'S TREATMENT & DISCHARGE SUMMARY   Patient Name: Joseph Lamb MRN: [id number] DOB:1951/10/03, 72 y.o., male Today's Date: 09/03/2023  PCP: Joseph Raider MD REFERRING PROVIDER: Vladimir Faster, DO  END OF SESSION:  End of Session - 09/03/23 1111     Visit Number 13    Number of Visits 17    Date for SLP Re-Evaluation 09/03/23    SLP Start Time 1105    SLP Stop Time  1145    SLP Time Calculation (min) 40 min    Activity Tolerance Patient tolerated treatment well               Past Medical History:  Diagnosis Date   Arrhythmia    Atrial fibrillation 10/11/2021   Cerebrovascular disease    moderate per 2021 MRI   Cholecystitis 09/01/2021   Chronic kidney disease, stage 3 unspecified 10/11/2021   Dizzy spells 03/08/2020   Erectile dysfunction 10/11/2021   Essential hypertension 06/14/2015   Essential tremor    Glaucoma 08/31/2021   Hyperlipidemia 06/14/2015   Irregular heart beat    Mild cognitive impairment of uncertain or unknown etiology 05/21/2023   Mixed hyperlipidemia    Multiple lacunar infarcts    bilateral centrum semiovale and right parietal cortex.   Obesity 10/11/2021   Obstructive sleep apnea    no CPAP   Other male erectile dysfunction    Primary open angle glaucoma (POAG) of left eye, moderate stage 10/11/2021   Primary open-angle glaucoma, right eye, mild stage 10/11/2021   Renal insufficiency 09/06/2022   Sinus bradycardia 03/08/2020   Tremor 10/11/2021   Type 2 diabetes mellitus with other specified complication (HCC)    Type II diabetes mellitus 10/11/2021   Vascular dementia [employer])    Past Surgical History:  Procedure Laterality Date   CHOLECYSTECTOMY N/A 08/31/2021   Procedure: LAPAROSCOPIC CHOLECYSTECTOMY;  Surgeon: Joseph Ore, MD;  Location: [facility] OR;  Service: General;  Laterality: N/A;   EYE SURGERY     Glaucoma surgery   PROSTATE SURGERY     Patient Active Problem List    Diagnosis Date Noted   Essential tremor    Mixed hyperlipidemia    Irregular heart beat    Other male erectile dysfunction    Type 2 diabetes mellitus with other specified complication (HCC)    Vascular dementia (HCC)    Mild cognitive impairment of uncertain or unknown etiology 05/21/2023   Obstructive sleep apnea    Multiple lacunar infarcts    Cerebrovascular disease    Renal insufficiency 09/06/2022   Atrial fibrillation 10/11/2021   Chronic kidney disease, stage 3 unspecified 10/11/2021   Obesity 10/11/2021   Erectile dysfunction 10/11/2021   Primary open angle glaucoma (POAG) of left eye, moderate stage 10/11/2021   Primary open-angle glaucoma, right eye, mild stage 10/11/2021   Tremor 10/11/2021   Type II diabetes mellitus 10/11/2021   Cholecystitis 09/01/2021   Glaucoma 08/31/2021   Arrhythmia    Dizzy spells 03/08/2020   Sinus bradycardia 03/08/2020   Essential hypertension 06/14/2015   Hyperlipidemia 06/14/2015    ONSET DATE: 06/19/2023(referral date)  REFERRING DIAG:  Diagnosis  G20.A1 (ICD-10-CM) - Parkinson's disease 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  The consultation is for the evaluation of tremor and Parkinson's.  Pt with wife who supplements hx.  Patient has been seen in our clinic for a long time.  He was first seen by Dr. Karel Lamb in 2016 and Dr. Marjory Lamb prior to that.  Initial evaluation for his further evaluation of memory change, but at that point in time his MoCA was 29/30.  He came back for further evaluation in 2021.  In the June, 2021 evaluation, it is  noted that patient had begun to have hand tremors for 5 months prior.  At that point in time, Dr. Karel Lamb did not see much on the examination, however.  By August, 2022, Dr. Karel Lamb did note occasional right thumb resting tremor and mild cogwheeling in the office.  The following visits were with Joseph Lamb.  With time, she began to note right greater than left tremors at rest, mouth tremor, sialorrhea and increased tone.  He was started on levodopa by her in February, 2024.  He apparently discontinued it due to the fact that he felt his heart was fluttering and he d/c the medication.  .he thinks that he took it for a months.  He states that mostly he noted no difference in tremor so he d/c it.  he saw Joseph Lamb for memory testing in August, 2024, demonstrating mild cognitive impairment.  PAIN:  Are you having pain? No  FALLS: Has patient fallen in last 6 months?  See PT evaluation for details  LIVING ENVIRONMENT: Lives with: lives with their Joseph Lamb and lives with their Joseph Lamb Lives in: House/apartment  PLOF:  Level of assistance: Independent with ADLs, Independent with IADLs Employment: Retired  PATIENT GOALS: "To talk better"  OBJECTIVE:   TODAY'S TREATMENT:                                                                                                                                         DATE:   09/03/23: Joseph Lamb has consistently completed Speak Out! Twice daily and maintains HEP log. Today he demonstrates mod I completion of Speak Out! eLibrary Dec 2. He meets dB targets with mod IFayrene Lamb is correcting when he is not speaking with intent with supervision cues. He verbalizes need to complete HEP daily upon d/c. Joseph Lamb is to call (himself) Joseph Lamb and request access to [employer]. We role played making the phone call to request E Library access with rare in A for intent. Joseph Lamb benefited from verbal cue to spell his last name slowly. We roles played live call and voice mail. Generated  written scripts to help communication with this task.   09/01/23: HEP log completed twice daily for 6/7 days. [name] is primarily using the on line Speak Out! Sessions with success. Targeted independence in HEP for upcoming d/c. Completed Speak Out! E library "Going out -  grocery store" - Kishaun hit dB targets with mod I for all exercises. In conversation. He maintained 70dB with supervision cues. Freemon Id'd when his voice "trailed off" with mod I 2x and self corrected.   08/27/23: HEP log continues to be filled out indicating twice daily completion of Speak Out! Targeted intelligibility and volume speaking with intent using Speak Out! eLibrary lesson November lesson 4. Jquan achieved target dB levels on all structured tasks. In conversation Trance maintained 72dB average with rare min A. Communicative Effectiveness Survey completed by Joseph Lamb pre and post improved from 19/30 to 26/30. He improved in all items except being heard in a noisy environment.   08/25/23: Joseph Lamb returns with HEP log completed - reviewed need to complete full lesson, not just the warm up, ah, glides and counting. Today, we completed online E-library lesson - November Lesson 3 in Speak Out! He is achieving all dB targets. In cognitive task naming steps in tasks - He generated 4-5 steps  averaging 73dB with mod I with very familiar tasks (baking sweet potato pie and changing oil) and required occasional to usual min verbal and gesture cues to maintain 70dB in less familiar tasks due to increased cognitive load. In conversation, Loreto required occasional min A to maintain 70-72dB and use intent. Joseph Lamb plans to attend next session. Instructed him to set up a Speak Out! Account to access [employer].   08/18/23: Joseph Lamb has not filled out HEP log nor completed HEP - he is on Lesson 16 on Speak out! Reviewed requirement to complete lessons twice daily while receiving ST then 1x a day after d/c. Joseph Lamb aware to check his HEP log. I requested that his  Joseph Lamb attend next session to provide another support person to help complete HEP. Speak Out! Lesson 16 with supervision cues to complete accurately and with specified dB level. He did complete an on line session 1x yesterday. In conversation Boden required occasional min A to maintain 70dB. Ongoing encouragement to complete HEP more consistently  08/13/23: Joseph Lamb enters with WNL, he filled out exercise for today prior to completing HEP twice. Instructed him to not fill out the log until AFTER he completes the lesson. Speak out! Lesson 15 completed with supervisions cues to complete each exercise accurately and hits dB targets. In simple conversation, Harshdeep required occasional min verbal cues and gestures to average 70-72dB over 12 minutes.    11/4/24Fayrene Lamb enters with WNL volume. He reports consistent completion of Speak Out! Twice daily and is on Lesson 13. He has used daily HEP log to track his twice daily progress. He completed Speak Out! Lesson 13 hitting all dB targets and completing each section accurately. He self corrected when he spoke in between exercises without intent. We role played placing orders at [employer] and giving/spelling his name, birth date,  address and phone numbers at the doctor's office. Eliasar maintained 72+dB in these with supervision cues. In  15 minute conversation re: controversial political issues, Shilo maintained 70-73dB with occasional min visual and gesture cues. He is to complete 1 on line session by next Monday 08/18/23.   08/06/23: Joseph Lamb did not complete SpeakOut! Yesterday. He said he took the day off. I generated a log to track his twice daily and the lesson he has just completed. He completed Speak Out! Lesson 9 hitting all dB targets with supervision cues. In conversation over 15 minutes with prompts, then spontaneous, Bhavik averaged 70dB with rare min A.   0/28/24: Joseph Lamb reports consistently  completing each lesson in Speak Out! Workbook twice a  day. He enters knowing that he is on Lesson 9. Today we used an Theatre stage manager from Speak Out! eLibrary (November week 1) Joseph Lamb required occasional min verbal and visual cues to hold Ah for 10 seconds. He hit all dB targets in the lesson with rare min A (occasional min A for conversation exercises - divergent naming) In simple conversation Tarrence required occasional min verbal and gesture cues to maintain 72dB - volume decreases and cognitive load increases - Christino verbalizes awareness of slow processing. Education provided for ways family members can assist him in participating in conversations with small groups of family. He told his other Joseph Lamb about his PD - they endorsed that he will watch the "what is Parkinson's" webinar on speak out! Website.   10/23/24Fayrene Lamb reports completing Speak Out! 1x - continue to require him to complete this twice a day. He self correct intent voice 3x with supervision cues in initial conversation.  Targeted volume and intelligibility using Speak Out! Lesson 6. Pt required rare min  verbal cues, modeling, for volume and breath support. Pt averages the following volume levels:  Sustained AH: 93 dB  Counting:87  Reading (phrases): 87dB  Cognitive Exercise: 75dB Required usual mod verbal cues, modeling for carryover of intent /volume completing and explaining idioms   07/28/23: Onni reports completing Speak Out! Through lesson 5, however he did not complete each lesson twice. I reviewed requirement to complete each lesson 2x, twice a day. We completed Lesson 5 today.  Targeted volume and intelligibility using Speak Out! Lesson 5. Pt required occasional min  verbal cues, modeling, for volume and breath support.  Pt averages the following volume levels:  Sustained AH: 90dB  Counting:84dB  Reading (phrases): 80dB  Cognitive Exercise: 78dB Required usual min to mod verbal cues, modeling for carryover of intent /volume on divergent naming task. Usual min semantic cues to  generate 5-8 items in simple categories. In simple description re: holiday traditions, Dejuan averaged 72dB with usual mod modeling, verbal and gesture cues.    07/23/23: Butler brings Speak Out! Workbook to the session. He and [name] have watched the Learn About Parkinson's webinar on [employer]. Instructed them to have their Joseph Lamb watch it as well. Juanmiguel endorses "I have been speaking low for a long time, I didn't realize it"  Previewed the workbook with them, introduced requirement of twice daily practice, advancing lessons after 2 sessions. Demonstrated online Speak Out! Sessions. Targeted volume and intelligibility using Speak Out! Lesson 1. Pt required usual to  occasional min verbal cues and modeling, for volume and breath support.  Pt averages the following volume levels  Sustained AH: 85dB  Counting:85dB  Reading (phrases): 75dB  Cognitive Exercise: 72dB Required usual mod verbal cues, modeling for carryover of intent /volume generating 3 sentence descriptions of basic objects.  following structured practice. In simple conversation over 5 minutes, Ulus averaged 70dB with usual mod visual and verbal cues.    07/09/23 (eval day): Introduced [employer]! Workbook to [employer]. Ordered work book - Introduced them to [employer], demonstrated location of Learn about [employer] and on Physiological scientist. Instructed them to watch webinar and an online practice session and have their 2 sons watch the webinar as well. Educated re: Automatic vs Intentional motor systems. Joseph Lamb Id'd when he was speaking or reading with intent vs not using intent. By the end of the session, he was conversing with intent  to average 70dB with occasional min a  PATIENT EDUCATION: Education details: See Today's Treatment; See patient instructions; HEP for dysarthria; PD education Person educated: Patient and Joseph Lamb Education method: Explanation, Demonstration, Verbal cues,  and Handouts Education comprehension: verbalized understanding, returned demonstration, verbal cues required, and needs further education  HOME EXERCISE PROGRAM: Speak Out! Workbook ordered - they are to bring it to ST when it arrives   GOALS: Goals reviewed with patient? Yes  SHORT TERM GOALS: Target date: 08/06/23  Pt will complete HEP hitting target dB's with occasional min A over 1 week Baseline: Goal status: MET  2.  Pt will complete HEP twice a day, completing at least 1 on line session with Parkinson [employer] website Baseline:  Goal status: MET  3.  Pt will averge 72dB 18/20 sentences with rare min A Baseline:  Goal status: MET  4.  Pt will average 72dB over 12 minute converation with rare min A Baseline:  Goal status: MET  5.  Pt and his family will watch Learn about Parkinson's webinar on [employer] website Baseline:  Goal status: MET              LONG TERM GOALS: Target date: 09/03/23  Pt will complete HEP for dysarthria twice daily accurately over 1 week with mod I Baseline:  Goal status:MET   2.  Pt will complete 2 on line practice sessions from [employer] online Baseline:  Goal status: MET  3.  Pt will maintain 70dB over 15 minute conversation with mod I Baseline:  Goal status: MET  4.  Pt will improve score on Communicative Effectiveness Survey by 3 points Baseline: 19 - post 26 Goal status: MET  5.  Pt/Joseph Lamb will verbalize requirement to continue Speak Out! Daily practice with workbook and online up d/c of ST Baseline:  Goal status: MET   ASSESSMENT:  CLINICAL IMPRESSION: Patient is a 72 y.o. 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 He is accompanied by his Joseph Lamb, Teryl Lucy. Fadel is completing HEP Speak Out! Program accurately, using a log to track lessons. Communicative Effectiveness Survey improved form 19 to 26. Joseph Lamb reports improvement in intelligibility. Yaseen reports improved  swallowing, reduced phlegm, reduced need for repetition. He is speaking with intent and maintaining 70dB with rare to occasional min A outside of therapy. D/C ST - he will return for PD screen in 6 months or sooner if needed.   OBJECTIVE IMPAIRMENTS: Objective impairments include memory and dysarthria. These impairments are limiting patient from effectively communicating at home and in community.Factors affecting potential to achieve goals and functional outcome are medical prognosis.. Patient will benefit from skilled SLP services to address above impairments and improve overall function.  REHAB POTENTIAL: Good  PLAN:  SLP FREQUENCY: 2x/week  SLP DURATION: 8 weeks  PLANNED INTERVENTIONS: Aspiration precaution training, Diet toleration management , Environmental controls, Cueing hierachy, Cognitive reorganization, Internal/external aids, Functional tasks, Multimodal communication approach, and SLP instruction and feedback MBSS  SPEECH THERAPY DISCHARGE SUMMARY  Visits from Start of Care: 13  Current functional level related to goals / functional outcomes: See goals below   Remaining deficits: Hypokinetic dysarthria; slow processing   Education / Equipment: HEP - Speak Out! Therapy program, compensations for slow processing; compensations for dysarthria   Patient agrees to discharge. Patient goals were met. Patient is being discharged due to meeting the stated rehab goals.     Kaylana Fenstermacher, Radene Journey, CCC-SLP 09/03/2023, 11:57 AM

## 2023-09-03 NOTE — Therapy (Signed)
OUTPATIENT PHYSICAL THERAPY NEURO TREATMENT/DISCHARGE SUMMARY   Patient Name: Joseph Lamb MRN: [id number] DOB:Jun 23, 1951, 72 y.o., male Today's Date: 09/03/2023   PCP: Lupita Raider, MD  REFERRING PROVIDER:  Vladimir Faster, DO  END OF SESSION:  PT End of Session - 09/03/23 1019     Visit Number 13    Number of Visits 13    Date for PT Re-Evaluation 09/07/23    Authorization Type [employer]    PT Start Time 1018    PT Stop Time 1055    PT Time Calculation (min) 37 min    Equipment Utilized During Treatment Gait belt    Activity Tolerance Patient tolerated treatment well    Behavior During Therapy WFL for tasks assessed/performed               Past Medical History:  Diagnosis Date   Arrhythmia    Atrial fibrillation 10/11/2021   Cerebrovascular disease    moderate per 2021 MRI   Cholecystitis 09/01/2021   Chronic kidney disease, stage 3 unspecified 10/11/2021   Dizzy spells 03/08/2020   Erectile dysfunction 10/11/2021   Essential hypertension 06/14/2015   Essential tremor    Glaucoma 08/31/2021   Hyperlipidemia 06/14/2015   Irregular heart beat    Mild cognitive impairment of uncertain or unknown etiology 05/21/2023   Mixed hyperlipidemia    Multiple lacunar infarcts    bilateral centrum semiovale and right parietal cortex.   Obesity 10/11/2021   Obstructive sleep apnea    no CPAP   Other male erectile dysfunction    Primary open angle glaucoma (POAG) of left eye, moderate stage 10/11/2021   Primary open-angle glaucoma, right eye, mild stage 10/11/2021   Renal insufficiency 09/06/2022   Sinus bradycardia 03/08/2020   Tremor 10/11/2021   Type 2 diabetes mellitus with other specified complication (HCC)    Type II diabetes mellitus 10/11/2021   Vascular dementia [facility])    Past Surgical History:  Procedure Laterality Date   CHOLECYSTECTOMY N/A 08/31/2021   Procedure: LAPAROSCOPIC CHOLECYSTECTOMY;  Surgeon: Quentin Ore, MD;  Location:  [facility] OR;  Service: General;  Laterality: N/A;   EYE SURGERY     Glaucoma surgery   PROSTATE SURGERY     Patient Active Problem List   Diagnosis Date Noted   Essential tremor    Mixed hyperlipidemia    Irregular heart beat    Other male erectile dysfunction    Type 2 diabetes mellitus with other specified complication (HCC)    Vascular dementia (HCC)    Mild cognitive impairment of uncertain or unknown etiology 05/21/2023   Obstructive sleep apnea    Multiple lacunar infarcts    Cerebrovascular disease    Renal insufficiency 09/06/2022   Atrial fibrillation 10/11/2021   Chronic kidney disease, stage 3 unspecified 10/11/2021   Obesity 10/11/2021   Erectile dysfunction 10/11/2021   Primary open angle glaucoma (POAG) of left eye, moderate stage 10/11/2021   Primary open-angle glaucoma, right eye, mild stage 10/11/2021   Tremor 10/11/2021   Type II diabetes mellitus 10/11/2021   Cholecystitis 09/01/2021   Glaucoma 08/31/2021   Arrhythmia    Dizzy spells 03/08/2020   Sinus bradycardia 03/08/2020   Essential hypertension 06/14/2015   Hyperlipidemia 06/14/2015    ONSET DATE: 06/19/2023  REFERRING DIAG: G20.A1 (ICD-10-CM) - Parkinson's disease without dyskinesia or fluctuating manifestations (HCC)   THERAPY DIAG:  Other symptoms and signs involving the nervous system  Muscle weakness (generalized)  Unsteadiness on feet  Other abnormalities of gait  and mobility  Rationale for Evaluation and Treatment: Rehabilitation  SUBJECTIVE:                                                                                                                                                                                             SUBJECTIVE STATEMENT:  No changes, no falls. Wants to review his exercises.   Pt accompanied by:  Spouse, [name]  PERTINENT HISTORY: PMH: PD,  A fb, stage 3 CKD, HLD, HTN, multiple lacunar infarcts, obesity, type II diabetes   Per Dr. Arbutus Leas:  Idiopathic  Parkinson's disease, moderate             -Diagnosis was recently made, but likely has had this for a number of years  PAIN:  Are you having pain? No  Vitals:   09/03/23 1023  BP: 127/70  Pulse: (!) 49    Pt's cardiologist aware of lower HR.   PRECAUTIONS: Fall  RED FLAGS: None   WEIGHT BEARING RESTRICTIONS: No  FALLS: Has patient fallen in last 6 months? No  LIVING ENVIRONMENT: Lives with: lives with their spouse Lives in: House/apartment Stairs: Yes: External: 3 steps; bilateral but cannot reach both Has following equipment at home: None Has trouble getting up from sitting in the bathtub   PLOF: Independent Sometimes needs help with buttoning   PATIENT GOALS: Slow down the progression, maintain as much as possible.   OBJECTIVE:  Note: Objective measures were completed at Evaluation unless otherwise noted.  COGNITION: Overall cognitive status: Within functional limits for tasks assessed and Impaired Neurocognitive testing in February, 2024 with evidence of mild cognitive impairment. This was largely stable compared to prior testing with Dr. Roseanne Ren[place] in 2021.    SENSATION: Light touch: WFL Pt reports sometimes numbness/tingling when getting up in the morning   COORDINATION: Heel to shin: WNL   MUSCLE TONE: Noted more stiffness in LLE    POSTURE: rounded shoulders and forward head  RUE resting tremors   LOWER EXTREMITY ROM:     Noted slightly decr R knee extension AROM   LOWER EXTREMITY MMT:    MMT Right Eval Left Eval  Hip flexion 5 5  Hip extension    Hip abduction    Hip adduction    Hip internal rotation    Hip external rotation    Knee flexion 5 5  Knee extension 5 5  Ankle dorsiflexion 5 5  Ankle plantarflexion    Ankle inversion    Ankle eversion    (Blank rows = not tested)  BED MOBILITY:  Pt reports no difficulty   TRANSFERS: Assistive device utilized: None  Sit  to stand: SBA Stand to sit: SBA Performs with no UE support    Pt notes difficulty getting in and out of the car, with bringing the legs in.   Pt reports difficulty getting up off the couch, sometimes will take a couple of tries.    FUNCTIONAL TESTS:  5 times sit to stand: 14 seconds with no UE support  10 meter walk test: 10.91 seconds = 3.01 ft/sec    TODAY'S TREATMENT:                                                                                                                               Therapeutic Activity:  Vitals:   09/03/23 1023  BP: 127/70  Pulse: (!) 49   Reviewed HEP (pt has handout in his therapy binder)  Pt performs PWR! Moves in quadruped position x 10 reps   PWR! Up for improved posture  PWR! Rock for improved weighshifting  PWR! Twist for improved trunk rotation   PWR! Step for improved step initiation   Cues provided for reminders of technique (pt had not been performing at home), pt did well and enjoys doing them on the floor. Pt able to get on and off the floor with mod I  Pt performs PWR! Moves in standing position x 10 reps   PWR! Up for improved posture  PWR! Rock for improved weighshifting  PWR! Twist for improved trunk rotation   PWR! Step for improved step initiation    Alternating forward/backsward step 10 reps each side, initial cues for larger amplitude step/weight shift posteriorly  Staggered stance weight shifting with arm swing 10 reps each side, with cues for opening up hands      PATIENT EDUCATION: Education details: Reviewed entirety of HEP, importance of continuing to exercise with PD after D/C (pt planning on trying some exercise classes after D/C from therapies), importance of continuing larger amplitude movements and swinging arms during gait  Person educated: Patient and Spouse Education method: Explanation, Demonstration, and Verbal cues Education comprehension: verbalized understanding, returned demonstration, and needs further education  HOME EXERCISE PROGRAM: Standing  PWR moves Quadruped PWR moves  Forward/backward stepping  Access Code: [email or web address] URL: [email or web address] Date: 07/30/2023 Prepared by: Sherlie Ban  Exercises - Staggered Stance Weight Shift with Arms Reaching  - 1-2 x daily - 5 x weekly - 2 sets - 10 reps   PHYSICAL THERAPY DISCHARGE SUMMARY  Visits from Start of Care: 13  Current functional level related to goals / functional outcomes: See LTGs/Clinical Assessment Statement    Remaining deficits: Bradykinesia, impaired timing/coordination of gait, tremors, impaired balance.   Education / Equipment: HEP, PD education, PD community resources    Patient agrees to discharge. Patient goals were met. Patient is being discharged due to meeting the stated rehab goals.  GOALS: Goals reviewed with patient? Yes  SHORT TERM GOALS:  Target date: 08/06/2023  Pt will be independent with initial HEP for PD specific deficits  in order to build upon functional gains made in therapy. Baseline: Goal status: MET  2.  Pt will perform posterior stepping strategy in 3 steps or less in order to demo improved balance strategies.  Baseline: at least 5 steps, min A for balance   4-5 small steps with min A from therapist for balance (10/30) Goal status: NOT MET  3.  Pt will improve 5x sit<>stand to less than or equal to 12 sec to demonstrate improved functional strength and transfer efficiency.   Baseline: 14 seconds with no UE support  11.3 seconds (10/30) Goal status: MET   LONG TERM GOALS: Target date: 09/03/2023   Pt will be independent with final HEP for PD specific deficits in order to build upon functional gains made in therapy. Baseline:  Goal status: MET  2.  Pt will improve miniBEST to at least a 22/28 in order to demo decr fall risk Baseline: 17/28  23/28 on 11/25 Goal status: MET  3.  Pt will verbalize understanding of local Parkinson's disease resources, including options for continue community  fitness.  Baseline:  Goal status: MET  4.  Pt will improve TUG and TUG cog score to less than or equal to 10% difference for improved dual task/decreased fall risk.  Baseline:   TUG: 7.7 seconds Cog TUG: 12.2 seconds Goal status: NOT MET  5.  Pt will get up from lower/softer surfaces to mimic a couch/recliner mod I in order to demo improved transfers  Baseline: met during 08/27/23 with lower surfaces with mod I  Goal status: MET    ASSESSMENT:  CLINICAL IMPRESSION:  Today's skilled session focused on reviewing HEP for anticipated D/C. Reviewed technique for quadruped PWR moves, forward/retro stepping, and staggered stance weight shifting with big arm swing as pt had not been performing these at home. Pt with no further questions at this time. Educated on importance of continuing with exercise with PD after D/C from PT. Pt had met 4 out of 5 LTGs and decr fall risk based on miniBEST. Pt still with difficulty with dual tasking based on cog TUG. Based on progress with therapy, will be discharged at this time with pt in agreement with plan. Plan to schedule return PD screens in 6 months.    OBJECTIVE IMPAIRMENTS: Abnormal gait, decreased activity tolerance, decreased balance, decreased cognition, decreased coordination, decreased mobility, difficulty walking, decreased strength, impaired flexibility, impaired UE functional use, and postural dysfunction.   ACTIVITY LIMITATIONS: carrying, bending, stairs, transfers, dressing, and locomotion level  PARTICIPATION LIMITATIONS: community activity  PERSONAL FACTORS: Age, Behavior pattern, Past/current experiences, Time since onset of injury/illness/exacerbation, and 3+ comorbidities:  PD,  A fb, stage 3 CKD, HLD, HTN, multiple lacunar infarcts, obesity, type II diabetes   are also affecting patient's functional outcome.   REHAB POTENTIAL: Good  CLINICAL DECISION MAKING: Evolving/moderate complexity  EVALUATION COMPLEXITY:  Moderate  PLAN:  PT FREQUENCY: 2x/week  PT DURATION: 8 weeks  PLANNED INTERVENTIONS: Therapeutic exercises, Therapeutic activity, Neuromuscular re-education, Balance training, Gait training, Patient/Family education, Self Care, Joint mobilization, Stair training, DME instructions, and Re-evaluation  PLAN FOR NEXT SESSION: D/C   Drake Leach, PT, DPT 09/03/2023, 10:58 AM

## 2023-09-03 NOTE — Patient Instructions (Addendum)
   Call [employer] and request access to eLibrary  I graduated speech and would like access to the [employer].  Can you help me with this?  I do the on line classes with Lelon Mast and would like access to the [employer].  My email is: [email or web address]  If you need to leave a voice mail:   My name is Kristopher Sisak [id number] (spell with intent)  I am a [employer] Client and would like access to the [employer] Library  My phone number is [phone]

## 2023-09-08 ENCOUNTER — Ambulatory Visit: Payer: Medicare Other | Attending: Family Medicine | Admitting: Occupational Therapy

## 2023-09-08 ENCOUNTER — Encounter: Payer: Self-pay | Admitting: Occupational Therapy

## 2023-09-08 DIAGNOSIS — R29818 Other symptoms and signs involving the nervous system: Secondary | ICD-10-CM | POA: Diagnosis present

## 2023-09-08 DIAGNOSIS — R278 Other lack of coordination: Secondary | ICD-10-CM | POA: Insufficient documentation

## 2023-09-08 DIAGNOSIS — R2681 Unsteadiness on feet: Secondary | ICD-10-CM | POA: Insufficient documentation

## 2023-09-08 DIAGNOSIS — M6281 Muscle weakness (generalized): Secondary | ICD-10-CM | POA: Insufficient documentation

## 2023-09-08 NOTE — Therapy (Signed)
OUTPATIENT OCCUPATIONAL THERAPY PARKINSON'S TREATMENT  Patient Name: Joseph Lamb MRN: [id number] DOB:03-07-1951, 72 y.o., male Today's Date: 09/08/2023  PCP: Lupita Raider, MD REFERRING PROVIDER: Vladimir Faster, DO  END OF SESSION:  OT End of Session - 09/08/23 1102     Visit Number 9    Number of Visits 20    Date for OT Re-Evaluation 10/03/23    Authorization Type [employer] Medicare 2024 10% coinsur NO Ded VL: [place] Auth Not Reqd    Progress Note Due on Visit 10    OT Start Time 1100    OT Stop Time 1145    OT Time Calculation (min) 45 min    Activity Tolerance Patient tolerated treatment well    Behavior During Therapy [facility] for tasks assessed/performed             Past Medical History:  Diagnosis Date   Arrhythmia    Atrial fibrillation 10/11/2021   Cerebrovascular disease    moderate per 2021 MRI   Cholecystitis 09/01/2021   Chronic kidney disease, stage 3 unspecified 10/11/2021   Dizzy spells 03/08/2020   Erectile dysfunction 10/11/2021   Essential hypertension 06/14/2015   Essential tremor    Glaucoma 08/31/2021   Hyperlipidemia 06/14/2015   Irregular heart beat    Mild cognitive impairment of uncertain or unknown etiology 05/21/2023   Mixed hyperlipidemia    Multiple lacunar infarcts    bilateral centrum semiovale and right parietal cortex.   Obesity 10/11/2021   Obstructive sleep apnea    no CPAP   Other male erectile dysfunction    Primary open angle glaucoma (POAG) of left eye, moderate stage 10/11/2021   Primary open-angle glaucoma, right eye, mild stage 10/11/2021   Renal insufficiency 09/06/2022   Sinus bradycardia 03/08/2020   Tremor 10/11/2021   Type 2 diabetes mellitus with other specified complication (HCC)    Type II diabetes mellitus 10/11/2021   Vascular dementia [facility])    Past Surgical History:  Procedure Laterality Date   CHOLECYSTECTOMY N/A 08/31/2021   Procedure: LAPAROSCOPIC CHOLECYSTECTOMY;  Surgeon: Quentin Ore, MD;   Location: [facility] OR;  Service: General;  Laterality: N/A;   EYE SURGERY     Glaucoma surgery   PROSTATE SURGERY     Patient Active Problem List   Diagnosis Date Noted   Essential tremor    Mixed hyperlipidemia    Irregular heart beat    Other male erectile dysfunction    Type 2 diabetes mellitus with other specified complication (HCC)    Vascular dementia (HCC)    Mild cognitive impairment of uncertain or unknown etiology 05/21/2023   Obstructive sleep apnea    Multiple lacunar infarcts    Cerebrovascular disease    Renal insufficiency 09/06/2022   Atrial fibrillation 10/11/2021   Chronic kidney disease, stage 3 unspecified 10/11/2021   Obesity 10/11/2021   Erectile dysfunction 10/11/2021   Primary open angle glaucoma (POAG) of left eye, moderate stage 10/11/2021   Primary open-angle glaucoma, right eye, mild stage 10/11/2021   Tremor 10/11/2021   Type II diabetes mellitus 10/11/2021   Cholecystitis 09/01/2021   Glaucoma 08/31/2021   Arrhythmia    Dizzy spells 03/08/2020   Sinus bradycardia 03/08/2020   Essential hypertension 06/14/2015   Hyperlipidemia 06/14/2015    ONSET DATE: 06/19/2023  REFERRING DIAG: G20.A1 (ICD-10-CM) - Parkinson's disease without dyskinesia or fluctuating manifestations  THERAPY DIAG:  Other symptoms and signs involving the nervous system  Other lack of coordination  Muscle weakness (generalized)  Unsteadiness on  feet  Rationale for Evaluation and Treatment: Rehabilitation  SUBJECTIVE:   SUBJECTIVE STATEMENT: No falls and no pain right now. Can't complain  Pt accompanied by: self   PERTINENT HISTORY:  PMH: PD (newly diagnosed), A fb, stage 3 CKD, HLD, HTN, multiple lacunar infarcts, obesity, type II diabetes  Re: Dr. Arbutus Leas Neuro appt: 06/19/2023 Consultation is for the evaluation of tremor and Parkinson's.  He was first seen by Dr. Karel Jarvis in 2016 and Dr. Marjory Lies prior to that.  Initial evaluation for his further evaluation of memory  change, but at that point in time his MoCA was 29/30.  He came back for further evaluation in 2021.  In the June, 2021, it was noted that patient had began to have hand tremors for 5 months prior.  At that point in time, Dr. Karel Jarvis did not see much on the examination, however.  By August, 2022, Dr. Karel Jarvis did note occasional right thumb resting tremor and mild cogwheeling in the office.  The following visits were with Marlowe Kays.  With time, she began to note right greater than left tremors at rest, mouth tremor, sialorrhea and increased tone.  He was started on levodopa by her in February, 2024.  He apparently discontinued it due to the fact that he felt his heart was fluttering and he d/c the medication. He states that mostly he noted no difference in tremor so he d/c it.  He saw Dr. Milbert Coulter for memory testing in August, 2024, demonstrating mild cognitive impairment.  Dr. Milbert Coulter referred him to Dr. Arbutus Leas for further evaluation.   PRECAUTIONS: None  WEIGHT BEARING RESTRICTIONS: No  PAIN:  Are you having pain? Yes: NPRS scale: 4/10 Pain location: R hand and shoulder & abdominal Pain description: gnawing pain Aggravating factors: When I get upset/tired, eat certain foods - hot/spicy ie) hamburgers, BBQ Relieving factors: Stop the shaking in R hand/arm, Tums  FALLS: Has patient fallen in last 6 months? No  LIVING ENVIRONMENT: Lives with: lives with their spouse and lives with their son Lives in: House/apartment Stairs: Yes: External: 3 steps; bilateral but cannot reach both Has following equipment at home: shower chair and Grab bars  PLOF: Independent Sometimes needs help with buttoning & tying laces  PATIENT GOALS: To help with stiffness and dexterity in his hands/arms ie) has/had trouble turning to reach to back seat of the car, trouble with some dressing/buttons and other ADLs compared to PLOF.  OBJECTIVE:  Note: Objective measures were completed at Evaluation unless otherwise noted.  HAND  DOMINANCE: Right  ADLs: Overall ADLs: Mod I Transfers/ambulation related to ADLs: Ind without AE at this time Eating: Does it himself, including cutting food. Grooming: "I do an adequate job." Re: shaving (only missing a little bit) UB Dressing: Wife may help to pull down his shirt in the back LB Dressing: Takes extra time to tie laces Toileting: Mod I Bathing: Decent job. 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 and they were bubble packed but thinks he'll have to  Financial management: Wife is the Management consultant" Handwriting: 50% legible and Mild micrographia - see eval for sample  writing  MOBILITY STATUS: Independent  POSTURE COMMENTS:  rounded shoulders and forward head  ACTIVITY TOLERANCE: Activity tolerance: Good  FUNCTIONAL OUTCOME MEASURES: Fastening/unfastening 3 buttons: 42.68 sec Physical performance test: PPT#2 (simulated eating) 15.1 sec & PPT#4 (donning/doffing jacket): 16.09 sec  UEFI = 62/80 (see specific areas in eval)  COORDINATION: 9 Hole Peg test: Right: 36.28  sec; Left: 36.4 sec Box and Blocks:  Right 47 blocks, Left 43 blocks Tremors: Resting and Right  UE ROM:  WFL - slight end range limitations  UE MMT:   WFL Grip Strength: Right 57.5, 71.4, 75.6 Average 68.2 lbs Left 55.9, 70.5, 83.3 Average 69.9 lbs  SENSATION: WFL  MUSCLE TONE: TBA  COGNITION: Overall cognitive status: Within functional limits for tasks assessed - See ST evaluation  OBSERVATIONS: Dystonia  TODAY'S TREATMENT:                                                                                                                                 Pt issued PWR! Supine (basic 4) x 20 reps each with cues to move big/forceful - see pt instructions.  Pt shown how to use PWR! Moves to get in/out of bed  Practiced hooking/unhooking 4 buttons with shirt on x 2 w/ cues for efficiency and big movement  PATIENT EDUCATION: Education details:PWR! Supine Person educated: Patient and Spouse Education method: Explanation, Demonstration, Verbal cues, and Handouts Education comprehension: verbalized understanding, returned demonstration, verbal cues required, and needs further education  HOME EXERCISE PROGRAM: 07/30/2023: PWR! Hands; BIG ADLs 08/06/23: handwriting strategies 08/11/2023: PWR! Sitting; [place] method 08/13/2023: Tremor reduction strategies 08/27/23: coordination HEP  09/01/23: ways to prevent future related PD complications 09/08/23: PWR! Supine   GOALS: Goals reviewed with patient? Yes  GOALS:  SHORT TERM GOALS: Target date: 08/27/2023    Pt will be independent with PD specific HEP.  Baseline: not yet initiated Goal status: MET (Except PWR! Supine)   2.  Pt will verbalize understanding of adapted strategies to maximize safety and independence with ADLs/IADLs.  Baseline: not yet initiated Goal status: MET  3.  Pt will write his name/simple list with no significant decrease in size and maintain 75% legibility.  Baseline: 50% legibility Goal status: MET (at 100% - name in cursive, list in print)   4.  Pt will demonstrate improved fine motor coordination for ADLs as evidenced by decreasing 9 hole peg test score for BUE and by 3-5 secs  Baseline: Right: 36.28  sec; Left: 36.4 sec Goal status: PARTIALLY MET (RT = 35 sec, Lt = 32 sec.)  5.  Pt will be able to place at least 3-5 additional blocks using B hand with completion of Box and Blocks test.  Baseline: Right 47 blocks, Left 43 blocks Goal status: NOT MET (RT = 45, Lt = 45)    LONG TERM GOALS: Target  date: 10/10/2023    Pt will verbalize understanding of ways to prevent future PD related complications  and PD community resources.  Baseline: not yet initiated Goal status: MET  2.   Pt will verbalize understanding of ways to keep thinking skills sharp and ways to compensate for STM changes in the future.  Baseline: not yet initiated Goal status: INITIAL  3.  Pt will demonstrate improved ease with feeding as evidenced by decreasing PPT#2 by 2-4 secs.  Baseline: 15.1 sec Goal status: INITIAL  4.  Pt will demonstrate increased ease with dressing as evidenced by decreasing PPT#4 (don/ doff jacket) by 2-4 secs or more.  Baseline: 16.09 sec 08/11/2023: 17 seconds with personal jacket Goal status: IN PROGRESS  5. Pt will demonstrate improved ease with fastening buttons as evidenced by decreasing 3 button/unbutton time by 2-5 seconds  Baseline: 42.68 sec seconds Goal status: INITIAL  ASSESSMENT:  CLINICAL IMPRESSION: Pt has met 4/5 STG's and 1 LTG at this time. Pt has already reported improvements in functional tasks with noted significant improvement in handwriting  PERFORMANCE DEFICITS: in functional skills including ADLs, IADLs, coordination, dexterity, tone, ROM, strength, pain, flexibility, Fine motor control, Gross motor control, balance, endurance, decreased knowledge of precautions, decreased knowledge of use of DME, and UE functional use, cognitive skills including safety awareness, and psychosocial skills including coping strategies and environmental adaptation.   IMPAIRMENTS: are limiting patient from ADLs, IADLs, and leisure.   COMORBIDITIES:  may have co-morbidities  that affects occupational performance. Patient will benefit from skilled OT to address above impairments and improve overall function.  REHAB POTENTIAL: Excellent  PLAN:  OT FREQUENCY: 2x/week  OT DURATION: 10 weeks  PLANNED INTERVENTIONS: 97535 self care/ADL training, 82956 therapeutic exercise, 97530  therapeutic activity, 97112 neuromuscular re-education, functional mobility training, psychosocial skills training, energy conservation, coping strategies training, patient/family education, and DME and/or AE instructions  RECOMMENDED OTHER SERVICES: Pt received PT and ST evaluations already and appts in place for all 3 services  CONSULTED AND AGREED WITH PLAN OF CARE: Patient and family member/caregiver  PLAN FOR NEXT SESSION:  Help pt/family organize binder and issue PD exercise chart for greater carryover at home   Sheran Lawless, OT 09/08/2023, 11:02 AM

## 2023-09-10 ENCOUNTER — Ambulatory Visit: Payer: Medicare Other | Admitting: Occupational Therapy

## 2023-09-10 ENCOUNTER — Encounter: Payer: Self-pay | Admitting: Occupational Therapy

## 2023-09-10 DIAGNOSIS — R278 Other lack of coordination: Secondary | ICD-10-CM

## 2023-09-10 DIAGNOSIS — R29818 Other symptoms and signs involving the nervous system: Secondary | ICD-10-CM | POA: Diagnosis not present

## 2023-09-10 DIAGNOSIS — R2681 Unsteadiness on feet: Secondary | ICD-10-CM

## 2023-09-10 DIAGNOSIS — M6281 Muscle weakness (generalized): Secondary | ICD-10-CM

## 2023-09-10 NOTE — Patient Instructions (Signed)
(  Exercise) Monday Tuesday Wednesday Thursday Friday Saturday Sunday   PWR! On Back           PWR! Hands           Coordination           PWR! Sitting           PWR! Standing           PWR! All 4's           P.T. exercises           Speech exercises

## 2023-09-10 NOTE — Therapy (Signed)
OUTPATIENT OCCUPATIONAL THERAPY PARKINSON'S TREATMENT/10th PROGRESS NOTE  Patient Name: Joseph Lamb MRN: [id number] DOB:1950/11/20, 73 y.o., male Today's Date: 09/10/2023  PCP: Lupita Raider, MD REFERRING PROVIDER: Vladimir Faster, DO  END OF SESSION:  OT End of Session - 09/10/23 1015     Visit Number 10    Number of Visits 20    Date for OT Re-Evaluation 10/03/23    Authorization Type [employer] Medicare 2024 10% coinsur NO Ded VL: [place] Auth Not Reqd    Progress Note Due on Visit 10    OT Start Time 1015    OT Stop Time 1100    OT Time Calculation (min) 45 min    Activity Tolerance Patient tolerated treatment well    Behavior During Therapy [facility] for tasks assessed/performed             Past Medical History:  Diagnosis Date   Arrhythmia    Atrial fibrillation 10/11/2021   Cerebrovascular disease    moderate per 2021 MRI   Cholecystitis 09/01/2021   Chronic kidney disease, stage 3 unspecified 10/11/2021   Dizzy spells 03/08/2020   Erectile dysfunction 10/11/2021   Essential hypertension 06/14/2015   Essential tremor    Glaucoma 08/31/2021   Hyperlipidemia 06/14/2015   Irregular heart beat    Mild cognitive impairment of uncertain or unknown etiology 05/21/2023   Mixed hyperlipidemia    Multiple lacunar infarcts    bilateral centrum semiovale and right parietal cortex.   Obesity 10/11/2021   Obstructive sleep apnea    no CPAP   Other male erectile dysfunction    Primary open angle glaucoma (POAG) of left eye, moderate stage 10/11/2021   Primary open-angle glaucoma, right eye, mild stage 10/11/2021   Renal insufficiency 09/06/2022   Sinus bradycardia 03/08/2020   Tremor 10/11/2021   Type 2 diabetes mellitus with other specified complication (HCC)    Type II diabetes mellitus 10/11/2021   Vascular dementia [facility])    Past Surgical History:  Procedure Laterality Date   CHOLECYSTECTOMY N/A 08/31/2021   Procedure: LAPAROSCOPIC CHOLECYSTECTOMY;  Surgeon:  Quentin Ore, MD;  Location: [facility] OR;  Service: General;  Laterality: N/A;   EYE SURGERY     Glaucoma surgery   PROSTATE SURGERY     Patient Active Problem List   Diagnosis Date Noted   Essential tremor    Mixed hyperlipidemia    Irregular heart beat    Other male erectile dysfunction    Type 2 diabetes mellitus with other specified complication (HCC)    Vascular dementia (HCC)    Mild cognitive impairment of uncertain or unknown etiology 05/21/2023   Obstructive sleep apnea    Multiple lacunar infarcts    Cerebrovascular disease    Renal insufficiency 09/06/2022   Atrial fibrillation 10/11/2021   Chronic kidney disease, stage 3 unspecified 10/11/2021   Obesity 10/11/2021   Erectile dysfunction 10/11/2021   Primary open angle glaucoma (POAG) of left eye, moderate stage 10/11/2021   Primary open-angle glaucoma, right eye, mild stage 10/11/2021   Tremor 10/11/2021   Type II diabetes mellitus 10/11/2021   Cholecystitis 09/01/2021   Glaucoma 08/31/2021   Arrhythmia    Dizzy spells 03/08/2020   Sinus bradycardia 03/08/2020   Essential hypertension 06/14/2015   Hyperlipidemia 06/14/2015    ONSET DATE: 06/19/2023  REFERRING DIAG: G20.A1 (ICD-10-CM) - Parkinson's disease without dyskinesia or fluctuating manifestations  THERAPY DIAG:  Other symptoms and signs involving the nervous system  Other lack of coordination  Muscle weakness (generalized)  Unsteadiness on feet  Rationale for Evaluation and Treatment: Rehabilitation  SUBJECTIVE:   SUBJECTIVE STATEMENT: No falls and no pain right now. Can't complain  Pt accompanied by: self   PERTINENT HISTORY:  PMH: PD (newly diagnosed), A fb, stage 3 CKD, HLD, HTN, multiple lacunar infarcts, obesity, type II diabetes  Re: Dr. Arbutus Leas Neuro appt: 06/19/2023 Consultation is for the evaluation of tremor and Parkinson's.  He was first seen by Dr. Karel Jarvis in 2016 and Dr. Marjory Lies prior to that.  Initial evaluation for his  further evaluation of memory change, but at that point in time his MoCA was 29/30.  He came back for further evaluation in 2021.  In the June, 2021, it was noted that patient had began to have hand tremors for 5 months prior.  At that point in time, Dr. Karel Jarvis did not see much on the examination, however.  By August, 2022, Dr. Karel Jarvis did note occasional right thumb resting tremor and mild cogwheeling in the office.  The following visits were with Marlowe Kays.  With time, she began to note right greater than left tremors at rest, mouth tremor, sialorrhea and increased tone.  He was started on levodopa by her in February, 2024.  He apparently discontinued it due to the fact that he felt his heart was fluttering and he d/c the medication. He states that mostly he noted no difference in tremor so he d/c it.  He saw Dr. Milbert Coulter for memory testing in August, 2024, demonstrating mild cognitive impairment.  Dr. Milbert Coulter referred him to Dr. Arbutus Leas for further evaluation.   PRECAUTIONS: None  WEIGHT BEARING RESTRICTIONS: No  PAIN:  Are you having pain? Yes: NPRS scale: 4/10 Pain location: R hand and shoulder & abdominal Pain description: gnawing pain Aggravating factors: When I get upset/tired, eat certain foods - hot/spicy ie) hamburgers, BBQ Relieving factors: Stop the shaking in R hand/arm, Tums  FALLS: Has patient fallen in last 6 months? No  LIVING ENVIRONMENT: Lives with: lives with their spouse and lives with their son Lives in: House/apartment Stairs: Yes: External: 3 steps; bilateral but cannot reach both Has following equipment at home: shower chair and Grab bars  PLOF: Independent Sometimes needs help with buttoning & tying laces  PATIENT GOALS: To help with stiffness and dexterity in his hands/arms ie) has/had trouble turning to reach to back seat of the car, trouble with some dressing/buttons and other ADLs compared to PLOF.  OBJECTIVE:  Note: Objective measures were completed at Evaluation  unless otherwise noted.  HAND DOMINANCE: Right  ADLs: Overall ADLs: Mod I Transfers/ambulation related to ADLs: Ind without AE at this time Eating: Does it himself, including cutting food. Grooming: "I do an adequate job." Re: shaving (only missing a little bit) UB Dressing: Wife may help to pull down his shirt in the back LB Dressing: Takes extra time to tie laces Toileting: Mod I Bathing: Decent job. 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 and they were bubble packed but thinks he'll have to  Financial management: Wife is the Management consultant" Handwriting: 50% legible and Mild micrographia - see eval  for sample writing  MOBILITY STATUS: Independent  POSTURE COMMENTS:  rounded shoulders and forward head  ACTIVITY TOLERANCE: Activity tolerance: Good  FUNCTIONAL OUTCOME MEASURES: Fastening/unfastening 3 buttons: 42.68 sec Physical performance test: PPT#2 (simulated eating) 15.1 sec & PPT#4 (donning/doffing jacket): 16.09 sec  UEFI = 62/80 (see specific areas in eval)  COORDINATION: 9 Hole Peg test: Right: 36.28  sec; Left: 36.4 sec Box and Blocks:  Right 47 blocks, Left 43 blocks Tremors: Resting and Right  UE ROM:  WFL - slight end range limitations  UE MMT:   WFL Grip Strength: Right 57.5, 71.4, 75.6 Average 68.2 lbs Left 55.9, 70.5, 83.3 Average 69.9 lbs  SENSATION: WFL  MUSCLE TONE: TBA  COGNITION: Overall cognitive status: Within functional limits for tasks assessed - See ST evaluation  OBSERVATIONS: Dystonia  TODAY'S TREATMENT:                                                                                                                                 Pt issued PD exercise chart and organized exercise binder for him for greater carryover at home. Discussed ways to implement different ex's daily including all ex's provided in therapy as well as community classes for PD. Emphasized importance of whole body involvement (head, eyes, trunk, hands, voice - not just limbs)  UBE x 8 min, level 3 for cardio and endurance, maintaining 40 RPM or greater  Sit to stand with PWR! Up x 10 w/ cues to open hands big,  PWR! Twist in standing x 10 w/ eyes boost, followed by PWR! Rock w/ cross reaching bilaterally for trunk rotation w/ LE wt shifts - cues to move back arm behind him    PATIENT EDUCATION: Education details: PD ex chart Person educated: Patient and Spouse Education method: Explanation, Verbal cues, and Handouts Education comprehension: verbalized understanding and verbal cues required  HOME EXERCISE PROGRAM: 07/30/2023: PWR! Hands; BIG ADLs 08/06/23: handwriting strategies 08/11/2023: PWR! Sitting; [place] method 08/13/2023: Tremor reduction strategies 08/27/23: coordination HEP  09/01/23: ways to prevent future related PD complications 09/08/23: PWR! Supine 09/10/23: PD ex chart   GOALS: Goals reviewed with patient? Yes  GOALS:  SHORT TERM GOALS: Target date: 08/27/2023    Pt will be independent with PD specific HEP.  Baseline: not yet initiated Goal status: MET   2.  Pt will verbalize understanding of adapted strategies to maximize safety and independence with ADLs/IADLs.  Baseline: not yet initiated Goal status: MET  3.  Pt will write his name/simple list with no significant decrease in size and maintain 75% legibility.  Baseline: 50% legibility Goal status: MET (at 100% - name in cursive, list in print)   4.  Pt will demonstrate improved fine motor coordination for ADLs as evidenced by decreasing 9 hole peg test score for BUE and by  3-5 secs  Baseline: Right: 36.28  sec; Left: 36.4 sec Goal status: PARTIALLY MET (RT = 35 sec, Lt = 32  sec.)  5.  Pt will be able to place at least 3-5 additional blocks using B hand with completion of Box and Blocks test.  Baseline: Right 47 blocks, Left 43 blocks Goal status: NOT MET (RT = 45, Lt = 45)    LONG TERM GOALS: Target date: 10/10/2023    Pt will verbalize understanding of ways to prevent future PD related complications and PD community resources.  Baseline: not yet initiated Goal status: MET  2.   Pt will verbalize understanding of ways to keep thinking skills sharp and ways to compensate for STM changes in the future.  Baseline: not yet initiated Goal status: INITIAL  3.  Pt will demonstrate improved ease with feeding as evidenced by decreasing PPT#2 by 2-4 secs.  Baseline: 15.1 sec Goal status: INITIAL  4.  Pt will demonstrate increased ease with dressing as evidenced by decreasing PPT#4 (don/ doff jacket) by 2-4 secs or more.  Baseline: 16.09 sec 08/11/2023: 17 seconds with personal jacket Goal status: IN PROGRESS  5. Pt will demonstrate improved ease with fastening buttons as evidenced by decreasing 3 button/unbutton time by 2-5 seconds  Baseline: 42.68 sec seconds Goal status: INITIAL  ASSESSMENT:  CLINICAL IMPRESSION: This 10th progress note is for dates 07/28/23 to 09/10/23: Pt has met 4/5 STG's and 1 LTG at this time. Pt has already reported improvements in functional tasks with noted significant improvement in handwriting. Pt also appears more animated w/ decreased masked face. Pt will continue to benefit from O.T. to address remaining goals and maximize function in ADLS and all functional tasks and mobility.  PERFORMANCE DEFICITS: in functional skills including ADLs, IADLs, coordination, dexterity, tone, ROM, strength, pain, flexibility, Fine motor control, Gross motor control, balance, endurance, decreased knowledge of precautions, decreased knowledge  of use of DME, and UE functional use, cognitive skills including safety awareness, and psychosocial skills including coping strategies and environmental adaptation.   IMPAIRMENTS: are limiting patient from ADLs, IADLs, and leisure.   COMORBIDITIES:  may have co-morbidities  that affects occupational performance. Patient will benefit from skilled OT to address above impairments and improve overall function.  REHAB POTENTIAL: Excellent  PLAN:  OT FREQUENCY: 2x/week  OT DURATION: 10 weeks  PLANNED INTERVENTIONS: 97535 self care/ADL training, 16109 therapeutic exercise, 97530 therapeutic activity, 97112 neuromuscular re-education, functional mobility training, psychosocial skills training, energy conservation, coping strategies training, patient/family education, and DME and/or AE instructions  RECOMMENDED OTHER SERVICES: Pt received PT and ST evaluations already and appts in place for all 3 services  CONSULTED AND AGREED WITH PLAN OF CARE: Patient and family member/caregiver  PLAN FOR NEXT SESSION:  Ways to keep thinking skills sharp, standing reach task, begin addressing remaining goals   Sheran Lawless, OT 09/10/2023, 10:16 AM

## 2023-09-11 NOTE — Progress Notes (Signed)
Assessment/Plan:   1.  Idiopathic Parkinson's disease, moderate             -Diagnosis was recently made, but likely has had this for a number of years             -Continue carbidopa/levodopa 25/100, 1 tablet 3 times per day.  Doing much better with medication!  Is much less rigid now!  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             -We discussed community resources in the area including patient support groups and community exercise programs for PD and pt education was provided to the patient.  New info given today for when he is done with PT     2.  Memory change             -Neurocognitive testing in February, 2024 with evidence of mild cognitive impairment.  This was largely stable compared to prior testing with Dr. Roseanne Reno in 2021.             -Dr. Milbert Coulter noted that the patient was on donepezil, but felt that the medication was not necessary currently.   3.  Sialorrhea             -This is commonly associated with PD.  We talked about treatments.  The patient is not a candidate for oral anticholinergic therapy because of increased risk of confusion and falls.  We discussed Botox (type A and B) and 1% atropine drops.  We discusssed that candy like lemon drops can help by stimulating mm of the oropharynx to induce swallowing.  He wants to hold on this for now.     4.  Mood change             -Wife asks about lorazepam, but I do not recommend this regularly in a Parkinson's population due to fall risk.             -declines SSRI   Subjective:   Joseph Lamb was seen today in follow up for Parkinsons disease, newly diagnosed. Pt with wife who supplements hx.   My previous records were reviewed prior to todays visit as well as outside records available to me.  We restarted his levodopa last visit.  He had taken it sometime ago but did not find tremor relief so he stopped it.  I told him to keep  taking it whether or not he had tremor relief because he was very rigid.  He reports today that he is tolerating the medication, without side effects.  He is still bothered with tremor.  We also sent him for therapies and he has been attending those.  Notes in system are reviewed.  Pt denies falls.  Pt denies lightheadedness, near syncope.  No hallucinations.  Mood has been good.  He is still drooling some but its better per wife.  Has some cramping at night.  More tremor in the AM.  Current prescribed movement disorder medications: carbidopa/levodopa 25/100, 1 po tid    ALLERGIES:   Allergies  Allergen Reactions   Lisinopril Cough   Rosuvastatin     Other reaction(s): fatigue   Telmisartan     Other reaction(s): decreased kidney functions   Crestor [Rosuvastatin Calcium]     " fatigue"    Simvastatin Other (See Comments)    "fatigue"    CURRENT MEDICATIONS:  Current Meds  Medication Sig   amLODipine (NORVASC) 5 MG tablet TAKE 1 TABLET BY MOUTH EVERY DAY   apixaban (ELIQUIS) 5 MG TABS tablet Take 1 tablet (5 mg total) by mouth 2 (two) times daily.   donepezil (ARICEPT) 5 MG tablet Take 1 tablet (5 mg total) by mouth at bedtime.   hydrochlorothiazide (HYDRODIURIL) 25 MG tablet Take 25 mg by mouth at bedtime.   latanoprost (XALATAN) 0.005 % ophthalmic solution Place 1 drop into both eyes at bedtime.   Multiple Vitamins-Minerals (MULTIVITAMIN MEN 50+) TABS Take 1 tablet by mouth daily.   pantoprazole (PROTONIX) 40 MG tablet Take 40 mg by mouth 2 (two) times daily.   pravastatin (PRAVACHOL) 40 MG tablet Take 1 tablet (40 mg total) by mouth daily.   Semaglutide,0.25 or 0.5MG /DOS, (OZEMPIC, 0.25 OR 0.5 MG/DOSE,) 2 MG/3ML SOPN Inject 0.5 mg into the skin once a week.   sucralfate (CARAFATE) 1 g tablet Take 1 g by mouth 2 (two) times daily.   tadalafil (CIALIS) 20 MG tablet Take 20 mg by mouth daily as needed for erectile dysfunction.   timolol (BETIMOL) 0.5 % ophthalmic solution Place 1  drop into both eyes daily.     Objective:   PHYSICAL EXAMINATION:    VITALS:   Vitals:   09/15/23 0849  BP: 130/60  Pulse: (!) 50  SpO2: 97%  Weight: 214 lb 12.8 oz (97.4 kg)  Height: 6' (1.829 m)    GEN:  The patient appears stated age and is in NAD. HEENT:  Normocephalic, atraumatic.  The mucous membranes are moist. The superficial temporal arteries are without ropiness or tenderness.   Neurological examination:  Orientation: The patient is alert and oriented x3. Cranial nerves: There is good facial symmetry with  facial hypomimia. The speech is fluent and clear. Soft palate rises symmetrically and there is no tongue deviation. Hearing is intact to conversational tone. Sensation: Sensation is intact to light touch throughout Motor: Strength is at least antigravity x4.  Movement examination: Tone: There is mild increased tone on the L.  Tone is normal on the right Abnormal movements: mild to mod rest tremor on the LUE; more rare tremor in the RUE Coordination:  There is no decremation on the R and mild on the L Gait and Station: The patient has no difficulty arising out of a deep-seated chair without the use of the hands. The patient's stride length is just slightly decreased with bilateral UE rest tremor.  I have reviewed and interpreted the following labs independently    Chemistry      Component Value Date/Time   NA 140 09/02/2021 0328   K 3.8 09/02/2021 0328   CL 106 09/02/2021 0328   CO2 29 09/02/2021 0328   BUN 14 09/02/2021 0328   CREATININE 1.47 (H) 09/02/2021 0328      Component Value Date/Time   CALCIUM 8.6 (L) 09/02/2021 0328   ALKPHOS 75 09/02/2021 0328   AST 37 09/02/2021 0328   ALT 53 (H) 09/02/2021 0328   BILITOT 0.8 09/02/2021 0328       Lab Results  Component Value Date   WBC 11.1 (H) 09/02/2021   HGB 13.7 09/02/2021   HCT 42.5 09/02/2021   MCV 86.2 09/02/2021   PLT 189 09/02/2021    Lab Results  Component Value Date   TSH 3.440  07/24/2017     Total time spent on today's visit was 30 minutes, including both face-to-face time and nonface-to-face time.  Time included that spent on review of  records (prior notes available to me/labs/imaging if pertinent), discussing treatment and goals, answering patient's questions and coordinating care.  Cc:  Lupita Raider, MD

## 2023-09-15 ENCOUNTER — Ambulatory Visit: Payer: Medicare Other | Admitting: Neurology

## 2023-09-15 ENCOUNTER — Encounter: Payer: Self-pay | Admitting: Occupational Therapy

## 2023-09-15 ENCOUNTER — Ambulatory Visit: Payer: Medicare Other | Admitting: Occupational Therapy

## 2023-09-15 VITALS — BP 130/60 | HR 50 | Ht 72.0 in | Wt 214.8 lb

## 2023-09-15 DIAGNOSIS — G20A1 Parkinson's disease without dyskinesia, without mention of fluctuations: Secondary | ICD-10-CM

## 2023-09-15 DIAGNOSIS — R278 Other lack of coordination: Secondary | ICD-10-CM

## 2023-09-15 DIAGNOSIS — R29818 Other symptoms and signs involving the nervous system: Secondary | ICD-10-CM | POA: Diagnosis not present

## 2023-09-15 DIAGNOSIS — R2681 Unsteadiness on feet: Secondary | ICD-10-CM

## 2023-09-15 DIAGNOSIS — M6281 Muscle weakness (generalized): Secondary | ICD-10-CM

## 2023-09-15 MED ORDER — CARBIDOPA-LEVODOPA ER 50-200 MG PO TBCR: 1.0000 | EXTENDED_RELEASE_TABLET | Freq: Every day | ORAL | 1 refills | Status: DC

## 2023-09-15 NOTE — Patient Instructions (Addendum)
Keeping Thinking Skills Sharp:  1. Jigsaw puzzles 2. Card/board games 3. Talking on the phone/social events 4. [email or web address] 5. Online games 6. Word serches/crossword puzzles 7.  Logic puzzles 8. Aerobic exercise (stationary bike) 9. Eating balanced diet (fruits & veggies) 10. Drink water 11. Try something new--new recipe, hobby 12. Crafts 13. Do a variety of activities that are challenging 14.  Plan weekly meals and write a grocery list 15. Add cognitive activities to walking/exercising (think of animal/food/city with each letter of the alphabet, counting backwards, thinking of as many vegetables as you can, etc.).--Only do this  If safe (no freezing/falls). 16. Memorize song, poem, bible verse, etc.     Extension (Resistive)    With wrist over edge of table, lift _2-4 lbs, keeping arm on table surface.  Lower slowly. Repeat _10-15___ times. Do __2__ sessions per day.

## 2023-09-15 NOTE — Patient Instructions (Signed)
Take carbidopa/levodopa 25/100 at 7am/11am/4pm ADD carbidopa/levodopa 50/200 at bedtime  The physicians and staff at [facility] are committed to providing excellent care. You may receive a survey requesting feedback about your experience at our office. We strive to receive "very good" responses to the survey questions. If you feel that your experience would prevent you from giving the office a "very good " response, please contact our office to try to remedy the situation. We may be reached at [phone]. Thank you for taking the time out of your busy day to complete the survey.

## 2023-09-15 NOTE — Therapy (Signed)
OUTPATIENT OCCUPATIONAL THERAPY PARKINSON'S TREATMENT   Patient Name: Joseph Lamb MRN: [id number] DOB:1950/10/21, 72 y.o., male Today's Date: 09/15/2023  PCP: Lupita Raider, MD REFERRING PROVIDER: Vladimir Faster, DO  END OF SESSION:  OT End of Session - 09/15/23 1023     Visit Number 11    Number of Visits 20    Date for OT Re-Evaluation 10/03/23    Authorization Type [employer] Medicare 2024 10% coinsur NO Ded VL: [place] Auth Not Reqd    Progress Note Due on Visit 10    OT Start Time 1020    OT Stop Time 1100    OT Time Calculation (min) 40 min    Activity Tolerance Patient tolerated treatment well    Behavior During Therapy [facility] for tasks assessed/performed             Past Medical History:  Diagnosis Date   Arrhythmia    Atrial fibrillation 10/11/2021   Cerebrovascular disease    moderate per 2021 MRI   Cholecystitis 09/01/2021   Chronic kidney disease, stage 3 unspecified 10/11/2021   Dizzy spells 03/08/2020   Erectile dysfunction 10/11/2021   Essential hypertension 06/14/2015   Essential tremor    Glaucoma 08/31/2021   Hyperlipidemia 06/14/2015   Irregular heart beat    Mild cognitive impairment of uncertain or unknown etiology 05/21/2023   Mixed hyperlipidemia    Multiple lacunar infarcts    bilateral centrum semiovale and right parietal cortex.   Obesity 10/11/2021   Obstructive sleep apnea    no CPAP   Other male erectile dysfunction    Primary open angle glaucoma (POAG) of left eye, moderate stage 10/11/2021   Primary open-angle glaucoma, right eye, mild stage 10/11/2021   Renal insufficiency 09/06/2022   Sinus bradycardia 03/08/2020   Tremor 10/11/2021   Type 2 diabetes mellitus with other specified complication (HCC)    Type II diabetes mellitus 10/11/2021   Vascular dementia [facility])    Past Surgical History:  Procedure Laterality Date   CHOLECYSTECTOMY N/A 08/31/2021   Procedure: LAPAROSCOPIC CHOLECYSTECTOMY;  Surgeon: Quentin Ore, MD;   Location: [facility] OR;  Service: General;  Laterality: N/A;   EYE SURGERY     Glaucoma surgery   PROSTATE SURGERY     Patient Active Problem List   Diagnosis Date Noted   Essential tremor    Mixed hyperlipidemia    Irregular heart beat    Other male erectile dysfunction    Type 2 diabetes mellitus with other specified complication (HCC)    Vascular dementia (HCC)    Mild cognitive impairment of uncertain or unknown etiology 05/21/2023   Obstructive sleep apnea    Multiple lacunar infarcts    Cerebrovascular disease    Renal insufficiency 09/06/2022   Atrial fibrillation 10/11/2021   Chronic kidney disease, stage 3 unspecified 10/11/2021   Obesity 10/11/2021   Erectile dysfunction 10/11/2021   Primary open angle glaucoma (POAG) of left eye, moderate stage 10/11/2021   Primary open-angle glaucoma, right eye, mild stage 10/11/2021   Tremor 10/11/2021   Type II diabetes mellitus 10/11/2021   Cholecystitis 09/01/2021   Glaucoma 08/31/2021   Arrhythmia    Dizzy spells 03/08/2020   Sinus bradycardia 03/08/2020   Essential hypertension 06/14/2015   Hyperlipidemia 06/14/2015    ONSET DATE: 06/19/2023  REFERRING DIAG: G20.A1 (ICD-10-CM) - Parkinson's disease without dyskinesia or fluctuating manifestations  THERAPY DIAG:  Other symptoms and signs involving the nervous system  Other lack of coordination  Muscle weakness (generalized)  Unsteadiness  on feet  Rationale for Evaluation and Treatment: Rehabilitation  SUBJECTIVE:   SUBJECTIVE STATEMENT: My Rt wrist hurts 4-5/10 possibly due to tremors. Wife reports MD noted improvements and less stiffness. Wife also reports pt/wife are attending the PD community Christmas party  Pt accompanied by: self   PERTINENT HISTORY:  PMH: PD (newly diagnosed), A fb, stage 3 CKD, HLD, HTN, multiple lacunar infarcts, obesity, type II diabetes  Re: Dr. Arbutus Leas Neuro appt: 06/19/2023 Consultation is for the evaluation of tremor and Parkinson's.   He was first seen by Dr. Karel Jarvis in 2016 and Dr. Marjory Lies prior to that.  Initial evaluation for his further evaluation of memory change, but at that point in time his MoCA was 29/30.  He came back for further evaluation in 2021.  In the June, 2021, it was noted that patient had began to have hand tremors for 5 months prior.  At that point in time, Dr. Karel Jarvis did not see much on the examination, however.  By August, 2022, Dr. Karel Jarvis did note occasional right thumb resting tremor and mild cogwheeling in the office.  The following visits were with Marlowe Kays.  With time, she began to note right greater than left tremors at rest, mouth tremor, sialorrhea and increased tone.  He was started on levodopa by her in February, 2024.  He apparently discontinued it due to the fact that he felt his heart was fluttering and he d/c the medication. He states that mostly he noted no difference in tremor so he d/c it.  He saw Dr. Milbert Coulter for memory testing in August, 2024, demonstrating mild cognitive impairment.  Dr. Milbert Coulter referred him to Dr. Arbutus Leas for further evaluation.   PRECAUTIONS: None  WEIGHT BEARING RESTRICTIONS: No  PAIN:  Are you having pain? Yes: NPRS scale: 4/10 Pain location: R hand and shoulder & abdominal Pain description: gnawing pain Aggravating factors: When I get upset/tired, eat certain foods - hot/spicy ie) hamburgers, BBQ Relieving factors: Stop the shaking in R hand/arm, Tums  FALLS: Has patient fallen in last 6 months? No  LIVING ENVIRONMENT: Lives with: lives with their spouse and lives with their son Lives in: House/apartment Stairs: Yes: External: 3 steps; bilateral but cannot reach both Has following equipment at home: shower chair and Grab bars  PLOF: Independent Sometimes needs help with buttoning & tying laces  PATIENT GOALS: To help with stiffness and dexterity in his hands/arms ie) has/had trouble turning to reach to back seat of the car, trouble with some dressing/buttons and  other ADLs compared to PLOF.  OBJECTIVE:  Note: Objective measures were completed at Evaluation unless otherwise noted.  HAND DOMINANCE: Right  ADLs: Overall ADLs: Mod I Transfers/ambulation related to ADLs: Ind without AE at this time Eating: Does it himself, including cutting food. Grooming: "I do an adequate job." Re: shaving (only missing a little bit) UB Dressing: Wife may help to pull down his shirt in the back LB Dressing: Takes extra time to tie laces Toileting: Mod I Bathing: Decent job. 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 and they were bubble packed but thinks he'll  have to  Financial management: Wife is the "banker" Handwriting: 50% legible and Mild micrographia - see eval for sample writing  MOBILITY STATUS: Independent  POSTURE COMMENTS:  rounded shoulders and forward head  ACTIVITY TOLERANCE: Activity tolerance: Good  FUNCTIONAL OUTCOME MEASURES: Fastening/unfastening 3 buttons: 42.68 sec Physical performance test: PPT#2 (simulated eating) 15.1 sec & PPT#4 (donning/doffing jacket): 16.09 sec  UEFI = 62/80 (see specific areas in eval)  COORDINATION: 9 Hole Peg test: Right: 36.28  sec; Left: 36.4 sec Box and Blocks:  Right 47 blocks, Left 43 blocks Tremors: Resting and Right  UE ROM:  WFL - slight end range limitations  UE MMT:   WFL Grip Strength: Right 57.5, 71.4, 75.6 Average 68.2 lbs Left 55.9, 70.5, 83.3 Average 69.9 lbs  SENSATION: WFL  MUSCLE TONE: TBA  COGNITION: Overall cognitive status: Within functional limits for tasks assessed - See  ST evaluation  OBSERVATIONS: Dystonia  TODAY'S TREATMENT:                                                                                                                                 UBE x 5 min, level 3 for cardio and endurance, maintaining 40 RPM or greater  Issued ways to keep thinking skills sharp and reviewed. Stressed importance of keeping brain active as well as body.   Discussed ways to enhance sleep at night and decreased sleeping during the day and ways to implement a more active routine t/o day.   Pt reports slight increase in wrist pain d/t some weakness and tremors may be contributing to pain. Denies injuries. Pt issued wrist extension ex and recommended 2-3 lb weight to begin. Pt return demo of exercise w/ cues to stop or relax if tremors get worse   PATIENT EDUCATION: Education details: Keeping thinking skills sharp, wrist extension ex Person educated: Patient and Spouse Education method: Explanation, Verbal cues, and Handouts Education comprehension: verbalized understanding and verbal cues required  HOME EXERCISE PROGRAM: 07/30/2023: PWR! Hands; BIG ADLs 08/06/23: handwriting strategies 08/11/2023: PWR! Sitting; [place] method 08/13/2023: Tremor reduction strategies 08/27/23: coordination HEP  09/01/23: ways to prevent future related PD complications 09/08/23: PWR! Supine 09/10/23: PD ex chart 09/15/23: keeping thinking skills sharp, wrist exercise   GOALS: Goals reviewed with patient? Yes  GOALS:  SHORT TERM GOALS: Target date: 08/27/2023    Pt will be independent with PD specific HEP.  Baseline: not yet initiated Goal status: MET   2.  Pt will verbalize understanding of adapted strategies to maximize safety and independence with ADLs/IADLs.  Baseline: not yet initiated Goal status: MET  3.  Pt will write his name/simple list with no significant decrease in size and maintain 75% legibility.  Baseline: 50% legibility Goal status: MET (at 100% - name  in cursive, list in print)   4.  Pt will demonstrate improved fine motor coordination for ADLs as evidenced by decreasing 9 hole peg test score for BUE and by  3-5 secs  Baseline: Right: 36.28  sec; Left: 36.4 sec Goal status: PARTIALLY MET (RT = 35 sec, Lt = 32 sec.)  5.  Pt will be able to place at least 3-5 additional blocks using B hand with completion of Box and Blocks test.  Baseline: Right 47 blocks, Left 43 blocks Goal status: NOT MET (RT = 45, Lt = 45)    LONG TERM GOALS: Target date: 10/10/2023    Pt will verbalize understanding of ways to prevent future PD related complications and PD community resources.  Baseline: not yet initiated Goal status: MET  2.   Pt will verbalize understanding of ways to keep thinking skills sharp and ways to compensate for STM changes in the future.  Baseline: not yet initiated Goal status: INITIAL  3.  Pt will demonstrate improved ease with feeding as evidenced by decreasing PPT#2 by 2-4 secs.  Baseline: 15.1 sec Goal status: INITIAL  4.  Pt will demonstrate increased ease with dressing as evidenced by decreasing PPT#4 (don/ doff jacket) by 2-4 secs or more.  Baseline: 16.09 sec 08/11/2023: 17 seconds with personal jacket Goal status: IN PROGRESS  5. Pt will demonstrate improved ease with fastening buttons as evidenced by decreasing 3 button/unbutton time by 2-5 seconds  Baseline: 42.68 sec seconds Goal status: INITIAL  ASSESSMENT:  CLINICAL IMPRESSION: Pt has reported improvements in functional tasks and MD noted improvements at last MD visit (per wife report). Pt does report increased Rt wrist pain today and issued wrist ex (? If tremors are contributing to pain). Pt also appears more animated w/ decreased masked face. Pt will continue to benefit from O.T. to address remaining goals and maximize function in ADLS and all functional tasks and mobility.  PERFORMANCE DEFICITS: in functional skills including ADLs, IADLs, coordination,  dexterity, tone, ROM, strength, pain, flexibility, Fine motor control, Gross motor control, balance, endurance, decreased knowledge of precautions, decreased knowledge of use of DME, and UE functional use, cognitive skills including safety awareness, and psychosocial skills including coping strategies and environmental adaptation.   IMPAIRMENTS: are limiting patient from ADLs, IADLs, and leisure.   COMORBIDITIES:  may have co-morbidities  that affects occupational performance. Patient will benefit from skilled OT to address above impairments and improve overall function.  REHAB POTENTIAL: Excellent  PLAN:  OT FREQUENCY: 2x/week  OT DURATION: 10 weeks  PLANNED INTERVENTIONS: 97535 self care/ADL training, 25366 therapeutic exercise, 97530 therapeutic activity, 97112 neuromuscular re-education, functional mobility training, psychosocial skills training, energy conservation, coping strategies training, patient/family education, and DME and/or AE instructions  RECOMMENDED OTHER SERVICES: Pt received PT and ST evaluations already and appts in place for all 3 services  CONSULTED AND AGREED WITH PLAN OF CARE: Patient and family member/caregiver  PLAN FOR NEXT SESSION:   Assess wrist, standing reach task, review PWR! Seated   Sheran Lawless, OT 09/15/2023, 10:23 AM

## 2023-09-17 ENCOUNTER — Encounter: Payer: Self-pay | Admitting: Occupational Therapy

## 2023-09-17 ENCOUNTER — Ambulatory Visit: Payer: Medicare Other | Admitting: Occupational Therapy

## 2023-09-17 DIAGNOSIS — R278 Other lack of coordination: Secondary | ICD-10-CM

## 2023-09-17 DIAGNOSIS — M6281 Muscle weakness (generalized): Secondary | ICD-10-CM

## 2023-09-17 DIAGNOSIS — R29818 Other symptoms and signs involving the nervous system: Secondary | ICD-10-CM

## 2023-09-17 DIAGNOSIS — R2681 Unsteadiness on feet: Secondary | ICD-10-CM

## 2023-09-17 NOTE — Therapy (Signed)
OUTPATIENT OCCUPATIONAL THERAPY PARKINSON'S TREATMENT   Patient Name: Joseph Lamb MRN: [id number] DOB:08/08/1951, 72 y.o., male Today's Date: 09/17/2023  PCP: Lupita Raider, MD REFERRING PROVIDER: Vladimir Faster, DO  END OF SESSION:  OT End of Session - 09/17/23 1022     Visit Number 12    Number of Visits 20    Date for OT Re-Evaluation 10/03/23    Authorization Type [employer] Medicare 2024 10% coinsur NO Ded VL: [place] Auth Not Reqd    Progress Note Due on Visit 10    OT Start Time 1020    OT Stop Time 1100    OT Time Calculation (min) 40 min    Activity Tolerance Patient tolerated treatment well    Behavior During Therapy [facility] for tasks assessed/performed             Past Medical History:  Diagnosis Date   Arrhythmia    Atrial fibrillation 10/11/2021   Cerebrovascular disease    moderate per 2021 MRI   Cholecystitis 09/01/2021   Chronic kidney disease, stage 3 unspecified 10/11/2021   Dizzy spells 03/08/2020   Erectile dysfunction 10/11/2021   Essential hypertension 06/14/2015   Essential tremor    Glaucoma 08/31/2021   Hyperlipidemia 06/14/2015   Irregular heart beat    Mild cognitive impairment of uncertain or unknown etiology 05/21/2023   Mixed hyperlipidemia    Multiple lacunar infarcts    bilateral centrum semiovale and right parietal cortex.   Obesity 10/11/2021   Obstructive sleep apnea    no CPAP   Other male erectile dysfunction    Primary open angle glaucoma (POAG) of left eye, moderate stage 10/11/2021   Primary open-angle glaucoma, right eye, mild stage 10/11/2021   Renal insufficiency 09/06/2022   Sinus bradycardia 03/08/2020   Tremor 10/11/2021   Type 2 diabetes mellitus with other specified complication (HCC)    Type II diabetes mellitus 10/11/2021   Vascular dementia [facility])    Past Surgical History:  Procedure Laterality Date   CHOLECYSTECTOMY N/A 08/31/2021   Procedure: LAPAROSCOPIC CHOLECYSTECTOMY;  Surgeon: Quentin Ore, MD;   Location: [facility] OR;  Service: General;  Laterality: N/A;   EYE SURGERY     Glaucoma surgery   PROSTATE SURGERY     Patient Active Problem List   Diagnosis Date Noted   Essential tremor    Mixed hyperlipidemia    Irregular heart beat    Other male erectile dysfunction    Type 2 diabetes mellitus with other specified complication (HCC)    Vascular dementia (HCC)    Mild cognitive impairment of uncertain or unknown etiology 05/21/2023   Obstructive sleep apnea    Multiple lacunar infarcts    Cerebrovascular disease    Renal insufficiency 09/06/2022   Atrial fibrillation 10/11/2021   Chronic kidney disease, stage 3 unspecified 10/11/2021   Obesity 10/11/2021   Erectile dysfunction 10/11/2021   Primary open angle glaucoma (POAG) of left eye, moderate stage 10/11/2021   Primary open-angle glaucoma, right eye, mild stage 10/11/2021   Tremor 10/11/2021   Type II diabetes mellitus 10/11/2021   Cholecystitis 09/01/2021   Glaucoma 08/31/2021   Arrhythmia    Dizzy spells 03/08/2020   Sinus bradycardia 03/08/2020   Essential hypertension 06/14/2015   Hyperlipidemia 06/14/2015    ONSET DATE: 06/19/2023  REFERRING DIAG: G20.A1 (ICD-10-CM) - Parkinson's disease without dyskinesia or fluctuating manifestations  THERAPY DIAG:  Other symptoms and signs involving the nervous system  Other lack of coordination  Muscle weakness (generalized)  Unsteadiness  on feet  Rationale for Evaluation and Treatment: Rehabilitation  SUBJECTIVE:   SUBJECTIVE STATEMENT: My Rt wrist seems to be better.  They gave me an extended release carbidopa levodopa for night which also helps   Pt accompanied by: self   PERTINENT HISTORY:  PMH: PD (newly diagnosed), A fb, stage 3 CKD, HLD, HTN, multiple lacunar infarcts, obesity, type II diabetes  Re: Dr. Arbutus Leas Neuro appt: 06/19/2023 Consultation is for the evaluation of tremor and Parkinson's.  He was first seen by Dr. Karel Jarvis in 2016 and Dr. Marjory Lies prior  to that.  Initial evaluation for his further evaluation of memory change, but at that point in time his MoCA was 29/30.  He came back for further evaluation in 2021.  In the June, 2021, it was noted that patient had began to have hand tremors for 5 months prior.  At that point in time, Dr. Karel Jarvis did not see much on the examination, however.  By August, 2022, Dr. Karel Jarvis did note occasional right thumb resting tremor and mild cogwheeling in the office.  The following visits were with Marlowe Kays.  With time, she began to note right greater than left tremors at rest, mouth tremor, sialorrhea and increased tone.  He was started on levodopa by her in February, 2024.  He apparently discontinued it due to the fact that he felt his heart was fluttering and he d/c the medication. He states that mostly he noted no difference in tremor so he d/c it.  He saw Dr. Milbert Coulter for memory testing in August, 2024, demonstrating mild cognitive impairment.  Dr. Milbert Coulter referred him to Dr. Arbutus Leas for further evaluation.   PRECAUTIONS: None  WEIGHT BEARING RESTRICTIONS: No  PAIN:  Are you having pain? Yes: NPRS scale: 4/10 Pain location: R hand and shoulder & abdominal Pain description: gnawing pain Aggravating factors: When I get upset/tired, eat certain foods - hot/spicy ie) hamburgers, BBQ Relieving factors: Stop the shaking in R hand/arm, Tums  FALLS: Has patient fallen in last 6 months? No  LIVING ENVIRONMENT: Lives with: lives with their spouse and lives with their son Lives in: House/apartment Stairs: Yes: External: 3 steps; bilateral but cannot reach both Has following equipment at home: shower chair and Grab bars  PLOF: Independent Sometimes needs help with buttoning & tying laces  PATIENT GOALS: To help with stiffness and dexterity in his hands/arms ie) has/had trouble turning to reach to back seat of the car, trouble with some dressing/buttons and other ADLs compared to PLOF.  OBJECTIVE:  Note: Objective  measures were completed at Evaluation unless otherwise noted.  HAND DOMINANCE: Right  ADLs: Overall ADLs: Mod I Transfers/ambulation related to ADLs: Ind without AE at this time Eating: Does it himself, including cutting food. Grooming: "I do an adequate job." Re: shaving (only missing a little bit) UB Dressing: Wife may help to pull down his shirt in the back LB Dressing: Takes extra time to tie laces Toileting: Mod I Bathing: Decent job. 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 and they were bubble packed but thinks he'll have to  Financial management: Wife  is the "banker" Handwriting: 50% legible and Mild micrographia - see eval for sample writing  MOBILITY STATUS: Independent  POSTURE COMMENTS:  rounded shoulders and forward head  ACTIVITY TOLERANCE: Activity tolerance: Good  FUNCTIONAL OUTCOME MEASURES: Fastening/unfastening 3 buttons: 42.68 sec Physical performance test: PPT#2 (simulated eating) 15.1 sec & PPT#4 (donning/doffing jacket): 16.09 sec  UEFI = 62/80 (see specific areas in eval)  COORDINATION: 9 Hole Peg test: Right: 36.28  sec; Left: 36.4 sec Box and Blocks:  Right 47 blocks, Left 43 blocks Tremors: Resting and Right  UE ROM:  WFL - slight end range limitations  UE MMT:   WFL Grip Strength: Right 57.5, 71.4, 75.6 Average 68.2 lbs Left 55.9, 70.5, 83.3 Average 69.9 lbs  SENSATION: WFL  MUSCLE TONE: TBA  COGNITION: Overall cognitive status: Within functional limits for tasks assessed - See ST evaluation  OBSERVATIONS: Dystonia  TODAY'S  TREATMENT:                                                                                                                                 PWR! Seated (basic 4) x 10 reps each with cues to sit to front of chair, back off chair, and cues for eyes and hands boost - pt however has improved w/ less cueing needed for larger amplitude movements.   PWR! Up from sit to stand x 5 w/ no difficulty.  Modified PWR! Twist in standing over low table for trunk rotation  PWR! All 4's (basic 4) with mod cues to perform correctly - pt improved with repetition  Standing at corner hitting targets in diagonal patterns for whole body activation in high and low ranges utilizing PWR! Twist and PWR! Rock movements.    PATIENT EDUCATION: Education details: Keeping thinking skills sharp, wrist extension ex Person educated: Patient and Spouse Education method: Explanation, Verbal cues, and Handouts Education comprehension: verbalized understanding and verbal cues required  HOME EXERCISE PROGRAM: 07/30/2023: PWR! Hands; BIG ADLs 08/06/23: handwriting strategies 08/11/2023: PWR! Sitting; [place] method 08/13/2023: Tremor reduction strategies 08/27/23: coordination HEP  09/01/23: ways to prevent future related PD complications 09/08/23: PWR! Supine 09/10/23: PD ex chart 09/15/23: keeping thinking skills sharp, wrist exercise   GOALS: Goals reviewed with patient? Yes  GOALS:  SHORT TERM GOALS: Target date: 08/27/2023    Pt will be independent with PD specific HEP.  Baseline: not yet initiated Goal status: MET   2.  Pt will verbalize understanding of adapted strategies to maximize safety and independence with ADLs/IADLs.  Baseline: not yet initiated Goal status: MET  3.  Pt will write his name/simple list with no significant decrease in size and maintain 75% legibility.  Baseline: 50% legibility Goal status: MET (at 100% - name in cursive, list in print)   4.  Pt will demonstrate improved fine motor  coordination for ADLs as evidenced by decreasing 9 hole peg test score for BUE and by 3-5 secs  Baseline: Right: 36.28  sec; Left: 36.4 sec Goal status: PARTIALLY MET (RT = 35 sec, Lt = 32 sec.)  5.  Pt will be able to place at least 3-5 additional blocks using B hand with completion of Box and Blocks test.  Baseline: Right 47 blocks, Left 43 blocks Goal status: NOT MET (RT = 45, Lt = 45)    LONG TERM GOALS: Target date: 10/10/2023    Pt will verbalize understanding of ways to prevent future PD related complications and PD community resources.  Baseline: not yet initiated Goal status: MET  2.   Pt will verbalize understanding of ways to keep thinking skills sharp and ways to compensate for STM changes in the future.  Baseline: not yet initiated Goal status: MET  3.  Pt will demonstrate improved ease with feeding as evidenced by decreasing PPT#2 by 2-4 secs.  Baseline: 15.1 sec Goal status: INITIAL  4.  Pt will demonstrate increased ease with dressing as evidenced by decreasing PPT#4 (don/ doff jacket) by 2-4 secs or more.  Baseline: 16.09 sec 08/11/2023: 17 seconds with personal jacket Goal status: IN PROGRESS  5. Pt will demonstrate improved ease with fastening buttons as evidenced by decreasing 3 button/unbutton time by 2-5 seconds  Baseline: 42.68 sec seconds Goal status: INITIAL  ASSESSMENT:  CLINICAL IMPRESSION: Pt has reported improvements in functional tasks and MD noted improvements at last MD visit (per wife report). Pt w/ improvements Rt wrist today and noted significant improvements w/ PWR! Moves - still requires cues for eye/head boosts and more trunk rotation. Pt also appears more animated w/ decreased masked face. Pt will continue to benefit from O.T. to address remaining goals and maximize function in ADLS and all functional tasks and mobility.  PERFORMANCE DEFICITS: in functional skills including ADLs, IADLs, coordination, dexterity, tone, ROM, strength,  pain, flexibility, Fine motor control, Gross motor control, balance, endurance, decreased knowledge of precautions, decreased knowledge of use of DME, and UE functional use, cognitive skills including safety awareness, and psychosocial skills including coping strategies and environmental adaptation.   IMPAIRMENTS: are limiting patient from ADLs, IADLs, and leisure.   COMORBIDITIES:  may have co-morbidities  that affects occupational performance. Patient will benefit from skilled OT to address above impairments and improve overall function.  REHAB POTENTIAL: Excellent  PLAN:  OT FREQUENCY: 2x/week  OT DURATION: 10 weeks  PLANNED INTERVENTIONS: 97535 self care/ADL training, 16109 therapeutic exercise, 97530 therapeutic activity, 97112 neuromuscular re-education, functional mobility training, psychosocial skills training, energy conservation, coping strategies training, patient/family education, and DME and/or AE instructions  RECOMMENDED OTHER SERVICES: Pt received PT and ST evaluations already and appts in place for all 3 services  CONSULTED AND AGREED WITH PLAN OF CARE: Patient and family member/caregiver  PLAN FOR NEXT SESSION:  standing reach task w/ coordination, stand/reach with cognition (can use blaze pods, step patterns w/ go/no go)   Sheran Lawless, OT 09/17/2023, 10:23 AM

## 2023-09-22 ENCOUNTER — Ambulatory Visit: Payer: Medicare Other | Admitting: Occupational Therapy

## 2023-09-22 DIAGNOSIS — R29818 Other symptoms and signs involving the nervous system: Secondary | ICD-10-CM | POA: Diagnosis not present

## 2023-09-22 DIAGNOSIS — M6281 Muscle weakness (generalized): Secondary | ICD-10-CM

## 2023-09-22 DIAGNOSIS — R278 Other lack of coordination: Secondary | ICD-10-CM

## 2023-09-22 NOTE — Therapy (Unsigned)
OUTPATIENT OCCUPATIONAL THERAPY PARKINSON'S TREATMENT   Patient Name: Joseph Lamb MRN: [id number] DOB:1950/12/16, 72 y.o., male Today's Date: 09/22/2023  PCP: Lupita Raider, MD REFERRING PROVIDER: Vladimir Faster, DO  END OF SESSION:  OT End of Session - 09/22/23 1022     Visit Number 13    Number of Visits 20    Date for OT Re-Evaluation 10/03/23    Authorization Type [employer] Medicare 2024 10% coinsur NO Ded VL: [place] Auth Not Reqd    Progress Note Due on Visit 10    OT Start Time 1020    OT Stop Time 1101    OT Time Calculation (min) 41 min    Activity Tolerance Patient tolerated treatment well    Behavior During Therapy [facility] for tasks assessed/performed            Past Medical History:  Diagnosis Date   Arrhythmia    Atrial fibrillation 10/11/2021   Cerebrovascular disease    moderate per 2021 MRI   Cholecystitis 09/01/2021   Chronic kidney disease, stage 3 unspecified 10/11/2021   Dizzy spells 03/08/2020   Erectile dysfunction 10/11/2021   Essential hypertension 06/14/2015   Essential tremor    Glaucoma 08/31/2021   Hyperlipidemia 06/14/2015   Irregular heart beat    Mild cognitive impairment of uncertain or unknown etiology 05/21/2023   Mixed hyperlipidemia    Multiple lacunar infarcts    bilateral centrum semiovale and right parietal cortex.   Obesity 10/11/2021   Obstructive sleep apnea    no CPAP   Other male erectile dysfunction    Primary open angle glaucoma (POAG) of left eye, moderate stage 10/11/2021   Primary open-angle glaucoma, right eye, mild stage 10/11/2021   Renal insufficiency 09/06/2022   Sinus bradycardia 03/08/2020   Tremor 10/11/2021   Type 2 diabetes mellitus with other specified complication (HCC)    Type II diabetes mellitus 10/11/2021   Vascular dementia [facility])    Past Surgical History:  Procedure Laterality Date   CHOLECYSTECTOMY N/A 08/31/2021   Procedure: LAPAROSCOPIC CHOLECYSTECTOMY;  Surgeon: Quentin Ore, MD;   Location: [facility] OR;  Service: General;  Laterality: N/A;   EYE SURGERY     Glaucoma surgery   PROSTATE SURGERY     Patient Active Problem List   Diagnosis Date Noted   Essential tremor    Mixed hyperlipidemia    Irregular heart beat    Other male erectile dysfunction    Type 2 diabetes mellitus with other specified complication (HCC)    Vascular dementia (HCC)    Mild cognitive impairment of uncertain or unknown etiology 05/21/2023   Obstructive sleep apnea    Multiple lacunar infarcts    Cerebrovascular disease    Renal insufficiency 09/06/2022   Atrial fibrillation 10/11/2021   Chronic kidney disease, stage 3 unspecified 10/11/2021   Obesity 10/11/2021   Erectile dysfunction 10/11/2021   Primary open angle glaucoma (POAG) of left eye, moderate stage 10/11/2021   Primary open-angle glaucoma, right eye, mild stage 10/11/2021   Tremor 10/11/2021   Type II diabetes mellitus 10/11/2021   Cholecystitis 09/01/2021   Glaucoma 08/31/2021   Arrhythmia    Dizzy spells 03/08/2020   Sinus bradycardia 03/08/2020   Essential hypertension 06/14/2015   Hyperlipidemia 06/14/2015    ONSET DATE: 06/19/2023  REFERRING DIAG: G20.A1 (ICD-10-CM) - Parkinson's disease without dyskinesia or fluctuating manifestations  THERAPY DIAG:  Other lack of coordination  Muscle weakness (generalized)  Rationale for Evaluation and Treatment: Rehabilitation  SUBJECTIVE:   SUBJECTIVE  STATEMENT: He has graduated from ST and PT. trrr  Pt accompanied by: self   PERTINENT HISTORY:  PMH: PD (newly diagnosed), A fb, stage 3 CKD, HLD, HTN, multiple lacunar infarcts, obesity, type II diabetes  Re: Dr. Arbutus Leas Neuro appt: 06/19/2023 Consultation is for the evaluation of tremor and Parkinson's.  He was first seen by Dr. Karel Jarvis in 2016 and Dr. Marjory Lies prior to that.  Initial evaluation for his further evaluation of memory change, but at that point in time his MoCA was 29/30.  He came back for further evaluation  in 2021.  In the June, 2021, it was noted that patient had began to have hand tremors for 5 months prior.  At that point in time, Dr. Karel Jarvis did not see much on the examination, however.  By August, 2022, Dr. Karel Jarvis did note occasional right thumb resting tremor and mild cogwheeling in the office.  The following visits were with Marlowe Kays.  With time, she began to note right greater than left tremors at rest, mouth tremor, sialorrhea and increased tone.  He was started on levodopa by her in February, 2024.  He apparently discontinued it due to the fact that he felt his heart was fluttering and he d/c the medication. He states that mostly he noted no difference in tremor so he d/c it.  He saw Dr. Milbert Coulter for memory testing in August, 2024, demonstrating mild cognitive impairment.  Dr. Milbert Coulter referred him to Dr. Arbutus Leas for further evaluation.   PRECAUTIONS: None  WEIGHT BEARING RESTRICTIONS: No  PAIN:  Are you having pain? Yes: NPRS scale: 4/10 Pain location: R hand and shoulder & abdominal Pain description: gnawing pain Aggravating factors: When I get upset/tired, eat certain foods - hot/spicy ie) hamburgers, BBQ Relieving factors: Stop the shaking in R hand/arm, Tums  FALLS: Has patient fallen in last 6 months? No  LIVING ENVIRONMENT: Lives with: lives with their spouse and lives with their son Lives in: House/apartment Stairs: Yes: External: 3 steps; bilateral but cannot reach both Has following equipment at home: shower chair and Grab bars  PLOF: Independent Sometimes needs help with buttoning & tying laces  PATIENT GOALS: To help with stiffness and dexterity in his hands/arms ie) has/had trouble turning to reach to back seat of the car, trouble with some dressing/buttons and other ADLs compared to PLOF.  OBJECTIVE:  Note: Objective measures were completed at Evaluation unless otherwise noted.  HAND DOMINANCE: Right  ADLs: Overall ADLs: Mod I Transfers/ambulation related to ADLs: Ind  without AE at this time Eating: Does it himself, including cutting food. Grooming: "I do an adequate job." Re: shaving (only missing a little bit) UB Dressing: Wife may help to pull down his shirt in the back LB Dressing: Takes extra time to tie laces Toileting: Mod I Bathing: Decent job. 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 and they were bubble packed but thinks he'll have to  Financial management: Wife is the Management consultant" Handwriting: 50% legible and Mild micrographia - see eval for sample writing  MOBILITY STATUS: Independent  POSTURE COMMENTS:  rounded shoulders and forward head  ACTIVITY TOLERANCE: Activity tolerance: Good  FUNCTIONAL OUTCOME MEASURES: Fastening/unfastening 3 buttons: 42.68 sec Physical performance test: PPT#2 (simulated eating) 15.1 sec & PPT#4 (donning/doffing jacket): 16.09 sec  UEFI = 62/80 (see specific areas in eval)  COORDINATION: 9 Hole Peg test: Right: 36.28  sec; Left: 36.4 sec Box and Blocks:  Right 47 blocks, Left 43 blocks Tremors: Resting and Right  UE ROM:  WFL - slight end range limitations  UE MMT:   WFL Grip Strength: Right 57.5, 71.4, 75.6 Average 68.2 lbs Left 55.9, 70.5, 83.3 Average 69.9 lbs  SENSATION: WFL  MUSCLE TONE: TBA  COGNITION: Overall cognitive status: Within functional limits for tasks assessed - See ST evaluation  OBSERVATIONS: Dystonia  TODAY'S TREATMENT:                                                                                                                                 PWR! Seated (basic 4) x 10  reps each with cues to sit to front of chair, back off chair, and cues for eyes and hands boost - pt however has improved w/ less cueing needed for larger amplitude movements.   PWR! Up from sit to stand x 5 w/ no difficulty.  Modified PWR! Twist in standing over low table for trunk rotation  PWR! All 4's (basic 4) with mod cues to perform correctly - pt improved with repetition  Standing at corner hitting targets in diagonal patterns for whole body activation in high and low ranges utilizing PWR! Twist and PWR! Rock movements.    PATIENT EDUCATION: Education details: Keeping thinking skills sharp, wrist extension ex Person educated: Patient and Spouse Education method: Explanation, Verbal cues, and Handouts Education comprehension: verbalized understanding and verbal cues required  HOME EXERCISE PROGRAM: 07/30/2023: PWR! Hands; BIG ADLs 08/06/23: handwriting strategies 08/11/2023: PWR! Sitting; [place] method 08/13/2023: Tremor reduction strategies 08/27/23: coordination HEP  09/01/23: ways to prevent future related PD complications 09/08/23: PWR! Supine 09/10/23: PD ex chart 09/15/23: keeping thinking skills sharp, wrist exercise   GOALS: Goals reviewed with patient? Yes  GOALS:  SHORT TERM GOALS: Target date: 08/27/2023    Pt will be independent with PD specific HEP.  Baseline: not yet initiated Goal status: MET   2.  Pt will verbalize understanding of adapted strategies to maximize safety and independence with ADLs/IADLs.  Baseline: not yet initiated Goal status: MET  3.  Pt will write his name/simple list with no significant decrease in size and maintain 75% legibility.  Baseline: 50% legibility Goal status: MET (at 100% - name in cursive, list in print)   4.  Pt will demonstrate improved fine motor coordination for ADLs as evidenced by decreasing 9 hole peg test score for BUE and by 3-5 secs  Baseline: Right: 36.28  sec; Left: 36.4 sec 09/22/2023: 27 seconds; Left:  33.78 seconds Goal status: PARTIALLY MET (Met on R)  5.  Pt will be able to place at least 3-5  additional blocks using B hand with completion of Box and Blocks test.  Baseline: Right 47 blocks, Left 43 blocks 09/22/2023: Right 49 blocks, Left 50 blocks Goal status: PARTIALLY MET (on L)    LONG TERM GOALS: Target date: 10/10/2023    Pt will verbalize understanding of ways to prevent future PD related complications and PD community resources.  Baseline: not yet initiated Goal status: MET  2.   Pt will verbalize understanding of ways to keep thinking skills sharp and ways to compensate for STM changes in the future.  Baseline: not yet initiated Goal status: MET  3.  Pt will demonstrate improved ease with feeding as evidenced by decreasing PPT#2 by 2-4 secs.  Baseline: 15.1 sec 09/22/2023: 12.14 sec Goal status: MET  4.  Pt will demonstrate increased ease with dressing as evidenced by decreasing PPT#4 (don/ doff jacket) by 2-4 secs or more.  Baseline: 16.09 sec 08/11/2023: 17 seconds with personal jacket 09/22/2023: 12 seconds Goal status: MET  5. Pt will demonstrate improved ease with fastening buttons as evidenced by decreasing 3 button/unbutton time by 2-5 seconds  Baseline: 42.68 seconds 09/22/2023: 76 seconds Goal status: NOT MET  ASSESSMENT:  CLINICAL IMPRESSION: Pt has reported improvements in functional tasks and MD noted improvements at last MD visit (per wife report). Pt w/ improvements Rt wrist today and noted significant improvements w/ PWR! Moves - still requires cues for eye/head boosts and more trunk rotation. Pt also appears more animated w/ decreased masked face. Pt will continue to benefit from O.T. to address remaining goals and maximize function in ADLS and all functional tasks and mobility.  PERFORMANCE DEFICITS: in functional skills including ADLs, IADLs, coordination, dexterity, tone, ROM, strength, pain, flexibility, Fine motor control, Gross motor  control, balance, endurance, decreased knowledge of precautions, decreased knowledge of use of DME, and UE functional use, cognitive skills including safety awareness, and psychosocial skills including coping strategies and environmental adaptation.   IMPAIRMENTS: are limiting patient from ADLs, IADLs, and leisure.   COMORBIDITIES:  may have co-morbidities  that affects occupational performance. Patient will benefit from skilled OT to address above impairments and improve overall function.  REHAB POTENTIAL: Excellent  PLAN:  OT FREQUENCY: 2x/week  OT DURATION: 10 weeks  PLANNED INTERVENTIONS: 97535 self care/ADL training, 08657 therapeutic exercise, 97530 therapeutic activity, 97112 neuromuscular re-education, functional mobility training, psychosocial skills training, energy conservation, coping strategies training, patient/family education, and DME and/or AE instructions  RECOMMENDED OTHER SERVICES: Pt received PT and ST evaluations already and appts in place for all 3 services  CONSULTED AND AGREED WITH PLAN OF CARE: Patient and family member/caregiver  PLAN FOR NEXT SESSION:  standing reach task w/ coordination, stand/reach with cognition (can use blaze pods, step patterns w/ go/no go)   Delana Meyer, OT 09/22/2023, 3:18 PM

## 2023-09-24 ENCOUNTER — Ambulatory Visit: Payer: Medicare Other | Admitting: Occupational Therapy

## 2023-09-24 DIAGNOSIS — R2681 Unsteadiness on feet: Secondary | ICD-10-CM

## 2023-09-24 DIAGNOSIS — R29818 Other symptoms and signs involving the nervous system: Secondary | ICD-10-CM

## 2023-09-24 DIAGNOSIS — R278 Other lack of coordination: Secondary | ICD-10-CM

## 2023-09-24 NOTE — Therapy (Signed)
OUTPATIENT OCCUPATIONAL THERAPY PARKINSON'S TREATMENT/DISCHARGE   Patient Name: Joseph Lamb MRN: [id number] DOB:[birth year], 72 y.o., male Today's Date: 09/24/2023  PCP: Lupita Raider, MD REFERRING PROVIDER: Tat, Octaviano Batty, DO  OCCUPATIONAL THERAPY DISCHARGE SUMMARY  Visits from Start of Care: 14  Current functional level related to goals / functional outcomes: See below   Remaining deficits: Bradykinesia Hypokinesia Decreased trunk and head rotation Coordination Balance   Education / Equipment: See below   Patient agrees to discharge. Patient goals were met. Patient is being discharged due to being pleased with the current functional level..     END OF SESSION:  OT End of Session - 09/24/23 1526     Visit Number 14    Number of Visits 20    Date for OT Re-Evaluation 10/03/23    Authorization Type [employer] Medicare 2024 10% coinsur NO Ded VL: [place] Auth Not Reqd    Progress Note Due on Visit 10    OT Start Time 1015    OT Stop Time 1100    OT Time Calculation (min) 45 min    Activity Tolerance Patient tolerated treatment well    Behavior During Therapy [facility] for tasks assessed/performed            Past Medical History:  Diagnosis Date   Arrhythmia    Atrial fibrillation 10/11/2021   Cerebrovascular disease    moderate per 2021 MRI   Cholecystitis 09/01/2021   Chronic kidney disease, stage 3 unspecified 10/11/2021   Dizzy spells 03/08/2020   Erectile dysfunction 10/11/2021   Essential hypertension 06/14/2015   Essential tremor    Glaucoma 08/31/2021   Hyperlipidemia 06/14/2015   Irregular heart beat    Mild cognitive impairment of uncertain or unknown etiology 05/21/2023   Mixed hyperlipidemia    Multiple lacunar infarcts    bilateral centrum semiovale and right parietal cortex.   Obesity 10/11/2021   Obstructive sleep apnea    no CPAP   Other male erectile dysfunction    Primary open angle glaucoma (POAG) of left eye, moderate stage 10/11/2021    Primary open-angle glaucoma, right eye, mild stage 10/11/2021   Renal insufficiency 09/06/2022   Sinus bradycardia 03/08/2020   Tremor 10/11/2021   Type 2 diabetes mellitus with other specified complication (HCC)    Type II diabetes mellitus 10/11/2021   Vascular dementia [facility])    Past Surgical History:  Procedure Laterality Date   CHOLECYSTECTOMY N/A 08/31/2021   Procedure: LAPAROSCOPIC CHOLECYSTECTOMY;  Surgeon: Quentin Ore, MD;  Location: [facility] OR;  Service: General;  Laterality: N/A;   EYE SURGERY     Glaucoma surgery   PROSTATE SURGERY     Patient Active Problem List   Diagnosis Date Noted   Essential tremor    Mixed hyperlipidemia    Irregular heart beat    Other male erectile dysfunction    Type 2 diabetes mellitus with other specified complication (HCC)    Vascular dementia (HCC)    Mild cognitive impairment of uncertain or unknown etiology 05/21/2023   Obstructive sleep apnea    Multiple lacunar infarcts    Cerebrovascular disease    Renal insufficiency 09/06/2022   Atrial fibrillation 10/11/2021   Chronic kidney disease, stage 3 unspecified 10/11/2021   Obesity 10/11/2021   Erectile dysfunction 10/11/2021   Primary open angle glaucoma (POAG) of left eye, moderate stage 10/11/2021   Primary open-angle glaucoma, right eye, mild stage 10/11/2021   Tremor 10/11/2021   Type II diabetes mellitus 10/11/2021   Cholecystitis 09/01/2021  Glaucoma 08/31/2021   Arrhythmia    Dizzy spells 03/08/2020   Sinus bradycardia 03/08/2020   Essential hypertension 06/14/2015   Hyperlipidemia 06/14/2015    ONSET DATE: 06/19/2023  REFERRING DIAG: G20.A1 (ICD-10-CM) - Parkinson's disease without dyskinesia or fluctuating manifestations  THERAPY DIAG:  Other lack of coordination  Other symptoms and signs involving the nervous system  Unsteadiness on feet  Rationale for Evaluation and Treatment: Rehabilitation  SUBJECTIVE:   SUBJECTIVE STATEMENT: You worked me  hard today  Pt accompanied by: self   PERTINENT HISTORY:  PMH: PD (newly diagnosed), A fb, stage 3 CKD, HLD, HTN, multiple lacunar infarcts, obesity, type II diabetes  Re: Dr. Arbutus Leas Neuro appt: 06/19/2023 Consultation is for the evaluation of tremor and Parkinson's.  He was first seen by Dr. Karel Jarvis in 2016 and Dr. Marjory Lies prior to that.  Initial evaluation for his further evaluation of memory change, but at that point in time his MoCA was 29/30.  He came back for further evaluation in 2021.  In the June, 2021, it was noted that patient had began to have hand tremors for 5 months prior.  At that point in time, Dr. Karel Jarvis did not see much on the examination, however.  By August, 2022, Dr. Karel Jarvis did note occasional right thumb resting tremor and mild cogwheeling in the office.  The following visits were with Marlowe Kays.  With time, she began to note right greater than left tremors at rest, mouth tremor, sialorrhea and increased tone.  He was started on levodopa by her in February, 2024.  He apparently discontinued it due to the fact that he felt his heart was fluttering and he d/c the medication. He states that mostly he noted no difference in tremor so he d/c it.  He saw Dr. Milbert Coulter for memory testing in August, 2024, demonstrating mild cognitive impairment.  Dr. Milbert Coulter referred him to Dr. Arbutus Leas for further evaluation.   PRECAUTIONS: None  WEIGHT BEARING RESTRICTIONS: No  PAIN:  Are you having pain? Yes: NPRS scale: 4/10 Pain location: R hand and shoulder & abdominal Pain description: gnawing pain Aggravating factors: When I get upset/tired, eat certain foods - hot/spicy ie) hamburgers, BBQ Relieving factors: Stop the shaking in R hand/arm, Tums  FALLS: Has patient fallen in last 6 months? No  LIVING ENVIRONMENT: Lives with: lives with their spouse and lives with their son Lives in: House/apartment Stairs: Yes: External: 3 steps; bilateral but cannot reach both Has following equipment at home:  shower chair and Grab bars  PLOF: Independent Sometimes needs help with buttoning & tying laces  PATIENT GOALS: To help with stiffness and dexterity in his hands/arms ie) has/had trouble turning to reach to back seat of the car, trouble with some dressing/buttons and other ADLs compared to PLOF.  OBJECTIVE:  Note: Objective measures were completed at Evaluation unless otherwise noted.  HAND DOMINANCE: Right  ADLs: Overall ADLs: Mod I Transfers/ambulation related to ADLs: Ind without AE at this time Eating: Does it himself, including cutting food. Grooming: "I do an adequate job." Re: shaving (only missing a little bit) UB Dressing: Wife may help to pull down his shirt in the back LB Dressing: Takes extra time to tie laces Toileting: Mod I Bathing: Decent job. 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 of business and they were bubble packed but thinks he'll have to  Financial management: Wife is the Management consultant" Handwriting: 50% legible and Mild micrographia - see eval for sample writing  MOBILITY STATUS: Independent  POSTURE COMMENTS:  rounded shoulders and forward head  ACTIVITY TOLERANCE: Activity tolerance: Good  FUNCTIONAL OUTCOME MEASURES: Fastening/unfastening 3 buttons: 42.68 sec Physical performance test: PPT#2 (simulated eating) 15.1 sec & PPT#4 (donning/doffing jacket): 16.09 sec  UEFI = 62/80 (see specific areas in eval)  COORDINATION: 9 Hole Peg test: Right: 36.28  sec; Left: 36.4 sec Box and Blocks:  Right 47 blocks, Left 43 blocks Tremors: Resting and Right  UE ROM:  WFL -  slight end range limitations  UE MMT:   WFL Grip Strength: Right 57.5, 71.4, 75.6 Average 68.2 lbs Left 55.9, 70.5, 83.3 Average 69.9 lbs  SENSATION: WFL  MUSCLE TONE: TBA  COGNITION: Overall cognitive status: Within functional limits for tasks assessed - See ST evaluation  OBSERVATIONS: Dystonia  TODAY'S TREATMENT:                                                                                                                                 Standing w/ wt shift and different stepping patterns using PWR! Step and visual targets (agility dots) for visual cues to step big while tossing magnetic darts to dart board RUE and LUE  Standing on compliant surface (Air Ex) while performing wt shifts, contralateral and ipsilateral reaching in diagonal patterns to place small pegs in pegboard on vertical surface copying design to incorporate whole body activation - working on balance, trunk rotation, wt shifts, reaching big, coordination, and cognition   PATIENT EDUCATION: Education details: see above  HOME EXERCISE PROGRAM: 07/30/2023: PWR! Hands; BIG ADLs 08/06/23: handwriting strategies 08/11/2023: PWR! Sitting; [place] method 08/13/2023: Tremor reduction strategies 08/27/23: coordination HEP  09/01/23: ways to prevent future related PD complications 09/08/23: PWR! Supine 09/10/23: PD ex chart 09/15/23: keeping thinking skills sharp, wrist exercise   GOALS: Goals reviewed with patient? Yes  GOALS:  SHORT TERM GOALS: Target date: 08/27/2023    Pt will be independent with PD specific HEP.  Baseline: not yet initiated Goal status: MET   2.  Pt will verbalize understanding of adapted strategies to maximize safety and independence with ADLs/IADLs.  Baseline: not yet initiated Goal status: MET  3.  Pt will write his name/simple list with no significant decrease in size and maintain 75% legibility.  Baseline: 50% legibility Goal status: MET (at 100% - name in cursive, list in  print)   4.  Pt will demonstrate improved fine motor coordination for ADLs as evidenced by decreasing 9 hole peg test score for BUE and by 3-5 secs  Baseline: Right: 36.28  sec; Left: 36.4 sec 09/22/2023: 27 seconds;  Left: 33.78 seconds Goal status: PARTIALLY MET (Met on R)  5.  Pt will be able to place at least 3-5 additional blocks using B hand with completion of Box and Blocks test.  Baseline: Right 47 blocks, Left 43 blocks 09/24/2023: Right 49 blocks, Left 50 blocks Goal status: PARTIALLY MET (on L)    LONG TERM GOALS: Target date: 10/10/2023    Pt will verbalize understanding of ways to prevent future PD related complications and PD community resources.  Baseline: not yet initiated Goal status: MET  2.   Pt will verbalize understanding of ways to keep thinking skills sharp and ways to compensate for STM changes in the future.  Baseline: not yet initiated Goal status: MET  3.  Pt will demonstrate improved ease with feeding as evidenced by decreasing PPT#2 by 2-4 secs.  Baseline: 15.1 sec 09/22/2023: 12.14 sec Goal status: MET  4.  Pt will demonstrate increased ease with dressing as evidenced by decreasing PPT#4 (don/ doff jacket) by 2-4 secs or more.  Baseline: 16.09 sec 08/11/2023: 17 seconds with personal jacket 09/22/2023: 12 seconds Goal status: MET  5. Pt will demonstrate improved ease with fastening buttons as evidenced by decreasing 3 button/unbutton time by 2-5 seconds  Baseline: 42.68 seconds 09/22/2023: 76 seconds Goal status: NOT MET  ASSESSMENT:  CLINICAL IMPRESSION: Pt has met majority of goals and has improved in balance, mobility, flexibility and coordination. Pt also more engaged and less face masking. Pt and wife both have noticed improvement in function and ADLs   PERFORMANCE DEFICITS: in functional skills including ADLs, IADLs, coordination, dexterity, tone, ROM, strength, pain, flexibility, Fine motor control, Gross motor control, balance,  endurance, decreased knowledge of precautions, decreased knowledge of use of DME, and UE functional use, cognitive skills including safety awareness, and psychosocial skills including coping strategies and environmental adaptation.   IMPAIRMENTS: are limiting patient from ADLs, IADLs, and leisure.   COMORBIDITIES:  may have co-morbidities  that affects occupational performance. Patient will benefit from skilled OT to address above impairments and improve overall function.  REHAB POTENTIAL: Excellent  PLAN:  OT FREQUENCY: 2x/week  OT DURATION: 10 weeks  PLANNED INTERVENTIONS: 97535 self care/ADL training, 16109 therapeutic exercise, 97530 therapeutic activity, 97112 neuromuscular re-education, functional mobility training, psychosocial skills training, energy conservation, coping strategies training, patient/family education, and DME and/or AE instructions  RECOMMENDED OTHER SERVICES: Pt received PT and ST evaluations already and appts in place for all 3 services  CONSULTED AND AGREED WITH PLAN OF CARE: Patient and family member/caregiver  PLAN    D/C O.T.  (Screens already scheduled for 02/17/24)   Sheran Lawless, OT 09/24/2023, 3:26 PM

## 2023-10-16 ENCOUNTER — Ambulatory Visit: Payer: Medicare Other | Admitting: Cardiology

## 2023-11-12 ENCOUNTER — Encounter: Payer: Self-pay | Admitting: Podiatry

## 2023-11-12 ENCOUNTER — Ambulatory Visit (INDEPENDENT_AMBULATORY_CARE_PROVIDER_SITE_OTHER): Payer: Medicare Other | Admitting: Podiatry

## 2023-11-12 DIAGNOSIS — M79675 Pain in left toe(s): Secondary | ICD-10-CM | POA: Diagnosis not present

## 2023-11-12 DIAGNOSIS — E1169 Type 2 diabetes mellitus with other specified complication: Secondary | ICD-10-CM | POA: Diagnosis not present

## 2023-11-12 DIAGNOSIS — M79674 Pain in right toe(s): Secondary | ICD-10-CM | POA: Diagnosis not present

## 2023-11-12 DIAGNOSIS — B351 Tinea unguium: Secondary | ICD-10-CM

## 2023-11-20 NOTE — Progress Notes (Signed)
  Subjective:  Patient ID: Joseph Lamb, male    DOB: 1951/07/08,  MRN: [id number]  73 y.o. male presents preventative diabetic foot care and painful elongated mycotic toenails 1-5 bilaterally which are tender when wearing enclosed shoe gear. Pain is relieved with periodic professional debridement. Chief Complaint  Patient presents with   [facility]    He is here for a nail trim and last A1C was 7.1, PCP is Dr. Loreli and sees 2 times a year and in between as needed.     New problem(s): None   PCP is Loreli Kins, MD.  Allergies  Allergen Reactions   Lisinopril Cough   Telmisartan Other (See Comments)    Other reaction(s): decreased kidney functions   Crestor [Rosuvastatin Calcium]      fatigue    Rosuvastatin Other (See Comments)    Other reaction(s): fatigue   Simvastatin Other (See Comments)    fatigue    Review of Systems: Negative except as noted in the HPI.   Objective:  Joseph Lamb is a pleasant 73 y.o. male in NAD. AAO x 3.  Vascular Examination: Vascular status intact b/l with palpable pedal pulses. CFT immediate b/l. Pedal hair present. No edema. No pain with calf compression b/l. Skin temperature gradient WNL b/l. No varicosities noted. No cyanosis or clubbing noted.  Neurological Examination: Sensation grossly intact b/l with 10 gram monofilament. Vibratory sensation intact b/l.  Dermatological Examination: Pedal skin with normal turgor, texture and tone b/l. No open wounds nor interdigital macerations noted. Toenails 1-5 b/l thick, discolored, elongated with subungual debris and pain on dorsal palpation. No hyperkeratotic lesions noted b/l.   Musculoskeletal Examination: Muscle strength 5/5 to b/l LE.  No pain, crepitus noted b/l. No gross pedal deformities. Patient ambulates independently without assistive aids.   Radiographs: None Last A1c:       No data to display           Assessment:   1. Pain due to onychomycosis of toenails of both  feet   2. Type 2 diabetes mellitus with other specified complication, without long-term current use of insulin  [facility])    Plan:  Patient was evaluated and treated. All patient's and/or POA's questions/concerns addressed on today's visit. Toenails 1-5 debrided in length and girth without incident. Continue foot and shoe inspections daily. Monitor blood glucose per PCP/Endocrinologist's recommendations. Continue soft, supportive shoe gear daily. Report any pedal injuries to medical professional. Call office if there are any questions/concerns. -Patient/POA to call should there be question/concern in the interim.  Return in about 4 months (around 03/11/2024).  Delon LITTIE Merlin, DPM      [place] LOCATION: [address], [place] [address]                   Office [phone]   [facility] LOCATION: [address], [place] [address] Office [phone]

## 2023-11-21 ENCOUNTER — Ambulatory Visit: Payer: Medicare Other | Admitting: Cardiology

## 2023-12-02 ENCOUNTER — Ambulatory Visit: Payer: Medicare Other | Admitting: Podiatry

## 2024-01-01 ENCOUNTER — Ambulatory Visit: Payer: Medicare Other | Admitting: Cardiology

## 2024-01-03 ENCOUNTER — Ambulatory Visit: Admission: EM | Admit: 2024-01-03 | Discharge: 2024-01-03 | Disposition: A | Discharge status: Home / Self Care

## 2024-01-03 DIAGNOSIS — R600 Localized edema: Secondary | ICD-10-CM | POA: Diagnosis not present

## 2024-01-03 DIAGNOSIS — R001 Bradycardia, unspecified: Secondary | ICD-10-CM

## 2024-01-03 NOTE — Discharge Instructions (Addendum)
 EKG done today shows a slow heart rate which has been consistent with previous EKGs.  We believe that this swelling in the feet may be due to the slow heart rate possibly complicated by timolol eyedrops which are a beta-blocker that can slow your heart rate down more in rare situations.  At this point, recommend following up with your primary care doctor first thing next week to discuss the swelling and the eyedrops.  If you develop shortness of breath, chest pain, dizziness, lightheadedness or severe worsening of your swelling then recommend going to the emergency room for further evaluation

## 2024-01-03 NOTE — ED Triage Notes (Signed)
"  Both my feet are swelling, this has never happened like this before". I do have a PCP "but I have not been seen for this before". No recent acute illness (significant medical history). No chest pain. No respiratory symptoms.

## 2024-01-03 NOTE — ED Provider Notes (Addendum)
 [facility]    CSN: [id number] Arrival date & time: 01/03/24  1244      History   Chief Complaint Chief Complaint  Patient presents with   Foot Swelling    HPI Joseph Lamb is a 73 y.o. male.   73 year old male who presents urgent care with complaints of bilateral feet swelling.  This has been going on for about 3 days.  He denies any shortness of breath, chest pain, pain in the legs or changes in recent medication.  He has not had any recent surgeries.  His activity level has been normal.  He does have a history of swelling in the left leg but this time it is both legs.  He is scheduled to see his primary care doctor in a few days.  He does eat a lot of salt and has not been elevating his legs.     Past Medical History:  Diagnosis Date   Arrhythmia    Atrial fibrillation 10/11/2021   Cerebrovascular disease    moderate per 2021 MRI   Cholecystitis 09/01/2021   Chronic kidney disease, stage 3 unspecified 10/11/2021   Dizzy spells 03/08/2020   Erectile dysfunction 10/11/2021   Essential hypertension 06/14/2015   Essential tremor    Glaucoma 08/31/2021   Hyperlipidemia 06/14/2015   Irregular heart beat    Mild cognitive impairment of uncertain or unknown etiology 05/21/2023   Mixed hyperlipidemia    Multiple lacunar infarcts    bilateral centrum semiovale and right parietal cortex.   Obesity 10/11/2021   Obstructive sleep apnea    no CPAP   Other male erectile dysfunction    Primary open angle glaucoma (POAG) of left eye, moderate stage 10/11/2021   Primary open-angle glaucoma, right eye, mild stage 10/11/2021   Renal insufficiency 09/06/2022   Sinus bradycardia 03/08/2020   Tremor 10/11/2021   Type 2 diabetes mellitus with other specified complication (HCC)    Type II diabetes mellitus 10/11/2021   Vascular dementia [facility])     Patient Active Problem List   Diagnosis Date Noted   Essential tremor    Mixed hyperlipidemia    Irregular heart  beat    Other male erectile dysfunction    Type 2 diabetes mellitus with other specified complication (HCC)    Vascular dementia (HCC)    Mild cognitive impairment of uncertain or unknown etiology 05/21/2023   Obstructive sleep apnea    Multiple lacunar infarcts    Cerebrovascular disease    Renal insufficiency 09/06/2022   Atrial fibrillation 10/11/2021   Chronic kidney disease, stage 3 unspecified 10/11/2021   Obesity 10/11/2021   Erectile dysfunction 10/11/2021   Primary open angle glaucoma (POAG) of left eye, moderate stage 10/11/2021   Primary open-angle glaucoma, right eye, mild stage 10/11/2021   Tremor 10/11/2021   Type II diabetes mellitus 10/11/2021   Cholecystitis 09/01/2021   Glaucoma 08/31/2021   Arrhythmia    Dizzy spells 03/08/2020   Sinus bradycardia 03/08/2020   Essential hypertension 06/14/2015   Hyperlipidemia 06/14/2015    Past Surgical History:  Procedure Laterality Date   CHOLECYSTECTOMY N/A 08/31/2021   Procedure: LAPAROSCOPIC CHOLECYSTECTOMY;  Surgeon: Quentin Ore, MD;  Location: [facility] OR;  Service: General;  Laterality: N/A;   EYE SURGERY     Glaucoma surgery   PROSTATE SURGERY         Home Medications    Prior to Admission medications   Medication Sig Start Date End Date Taking? Authorizing Provider  Potassium Chloride ER  20 MEQ TBCR Take 1 tablet by mouth daily. 11/21/23  Yes [provider]  timolol (TIMOPTIC) 0.5 % ophthalmic solution Place 1 drop into both eyes daily. 12/26/23  Yes [provider]  amLODipine (NORVASC) 5 MG tablet TAKE 1 TABLET BY MOUTH EVERY DAY 04/22/22   Revankar, Aundra Dubin, MD  apixaban (ELIQUIS) 5 MG TABS tablet Take 1 tablet (5 mg total) by mouth 2 (two) times daily. 08/26/23   Revankar, Aundra Dubin, MD  carbidopa-levodopa (SINEMET CR) 50-200 MG tablet Take 1 tablet by mouth at bedtime. 09/15/23   Tat, Octaviano Batty, DO  carbidopa-levodopa (SINEMET) 25-100 MG tablet Week 1: Take 1/2  tablet morning, 1/2 tab  noon, and 1/2  tablet in evening/bedtime. Week 4  & thereafter: Take 1 tablet three times daily. Patient taking differently: Take 1 tablet three times daily. 12/02/22   Marcos Eke, PA-C  donepezil (ARICEPT) 5 MG tablet Take 1 tablet (5 mg total) by mouth at bedtime. 11/09/21   Marcos Eke, PA-C  hydrochlorothiazide (HYDRODIURIL) 25 MG tablet Take 25 mg by mouth at bedtime. 07/23/21   [provider]  latanoprost (XALATAN) 0.005 % ophthalmic solution Place 1 drop into both eyes at bedtime. 02/18/20   [provider]  Multiple Vitamins-Minerals (MULTIVITAMIN MEN 50+) TABS Take 1 tablet by mouth daily.    [provider]  pantoprazole (PROTONIX) 40 MG tablet Take 40 mg by mouth 2 (two) times daily. 07/12/23   [provider]  pravastatin (PRAVACHOL) 40 MG tablet Take 1 tablet (40 mg total) by mouth daily. 10/18/21   Revankar, Aundra Dubin, MD  Semaglutide,0.25 or 0.5MG /DOS, (OZEMPIC, 0.25 OR 0.5 MG/DOSE,) 2 MG/3ML SOPN Inject 0.5 mg into the skin once a week.    [provider]  sucralfate (CARAFATE) 1 g tablet Take 1 g by mouth 2 (two) times daily. 08/09/23   [provider]  tadalafil (CIALIS) 20 MG tablet Take 20 mg by mouth daily as needed for erectile dysfunction. 02/01/20   [provider]  timolol (BETIMOL) 0.5 % ophthalmic solution Place 1 drop into both eyes daily.    [provider]    Family History Family History  Problem Relation Age of Onset   Diabetes Mother    Tremor Mother    Diabetes Father     Social History Social History   Tobacco Use   Smoking status: Never    Passive exposure: Never   Smokeless tobacco: Never  Vaping Use   Vaping status: Never Used  Substance Use Topics   Alcohol use: Not Currently   Drug use: No     Allergies   Lisinopril, Telmisartan, Crestor [rosuvastatin calcium], Rosuvastatin, and Simvastatin   Review of Systems Review of Systems  Constitutional:  Negative for  chills and fever.  HENT:  Negative for ear pain and sore throat.   Eyes:  Negative for pain and visual disturbance.  Respiratory:  Negative for cough and shortness of breath.   Cardiovascular:  Positive for leg swelling. Negative for chest pain and palpitations.  Gastrointestinal:  Negative for abdominal pain and vomiting.  Genitourinary:  Negative for dysuria and hematuria.  Musculoskeletal:  Negative for arthralgias and back pain.  Skin:  Negative for color change and rash.  Neurological:  Negative for seizures and syncope.  All other systems reviewed and are negative.    Physical Exam Triage Vital Signs ED Triage Vitals  Encounter Vitals Group     BP 01/03/24 1405 117/63  Systolic BP Percentile --      Diastolic BP Percentile --      Pulse Rate 01/03/24 1405 (!) 49     Resp 01/03/24 1405 18     Temp 01/03/24 1405 98.3 F (36.8 C)     Temp Source 01/03/24 1405 Oral     SpO2 01/03/24 1405 97 %     Weight 01/03/24 1403 220 lb (99.8 kg)     Height 01/03/24 1403 6\' 1"  (1.854 m)     Head Circumference --      Peak Flow --      Pain Score 01/03/24 1358 0     Pain Loc --      Pain Education --      Exclude from Growth Chart --    No data found.  Updated Vital Signs BP 117/63 (BP Location: Left Arm)   Pulse (!) 49   Temp 98.3 F (36.8 C) (Oral)   Resp 18   Ht 6\' 1"  (1.854 m)   Wt 220 lb (99.8 kg)   SpO2 97%   BMI 29.03 kg/m   Visual Acuity Right Eye Distance:   Left Eye Distance:   Bilateral Distance:    Right Eye Near:   Left Eye Near:    Bilateral Near:     Physical Exam Vitals and nursing note reviewed.  Constitutional:      General: He is not in acute distress.    Appearance: He is well-developed.  HENT:     Head: Normocephalic and atraumatic.  Eyes:     Conjunctiva/sclera: Conjunctivae normal.  Cardiovascular:     Rate and Rhythm: Regular rhythm. Bradycardia present.     Pulses: Normal pulses.     Heart sounds: No murmur heard. Pulmonary:      Effort: Pulmonary effort is normal. No respiratory distress.     Breath sounds: Normal breath sounds.  Abdominal:     Palpations: Abdomen is soft.     Tenderness: There is no abdominal tenderness.  Musculoskeletal:        General: No swelling.     Cervical back: Neck supple.     Comments: 1+ pitting edema from the ankles to the feet edema stops at the ankles  Skin:    General: Skin is warm and dry.     Capillary Refill: Capillary refill takes less than 2 seconds.  Neurological:     Mental Status: He is alert.  Psychiatric:        Mood and Affect: Mood normal.      UC Treatments / Results  Labs (all labs ordered are listed, but only abnormal results are displayed) Labs Reviewed - No data to display  EKG   Radiology No results found.  Procedures Procedures (including critical care time)  Medications Ordered in UC Medications - No data to display  Initial Impression / Assessment and Plan / UC Course  I have reviewed the triage vital signs and the nursing notes.  Pertinent labs & imaging results that were available during my care of the patient were reviewed by me and considered in my medical decision making (see chart for details).     No diagnosis found.   New onset of bilateral lower extremity edema.  EKG done in our office secondary to bradycardia which shows sinus bradycardia with some PVCs.  Edema could be secondary to the bradycardia and possibly exacerbated by his timolol eyedrops.  Deep vein thrombosis is less likely given the bilateral presentation and the variability  in the amount of swelling that occurs throughout the day.  At this point recommend that the patient follow-up first thing next week with his primary care physician and if the symptoms worsen at all then recommend going to the emergency room immediately.  Did review through the patient's chart and he did have a echocardiogram done recently which did not show any significant abnormalities per the  cardiologist.  Final Clinical Impressions(s) / UC Diagnoses   Final diagnoses:  None   Discharge Instructions   None    ED Prescriptions   None    PDMP not reviewed this encounter.   Landis Martins, PA-C 01/03/24 1537    Landis Martins, [place] 01/03/24 1538

## 2024-01-15 ENCOUNTER — Encounter: Payer: Self-pay | Admitting: Cardiology

## 2024-01-15 ENCOUNTER — Ambulatory Visit: Attending: Cardiology | Admitting: Cardiology

## 2024-01-15 VITALS — BP 90/46 | HR 54 | Ht 73.0 in | Wt 222.0 lb

## 2024-01-15 DIAGNOSIS — I480 Paroxysmal atrial fibrillation: Secondary | ICD-10-CM

## 2024-01-15 DIAGNOSIS — E782 Mixed hyperlipidemia: Secondary | ICD-10-CM | POA: Diagnosis not present

## 2024-01-15 DIAGNOSIS — G4733 Obstructive sleep apnea (adult) (pediatric): Secondary | ICD-10-CM | POA: Diagnosis not present

## 2024-01-15 DIAGNOSIS — I10 Essential (primary) hypertension: Secondary | ICD-10-CM | POA: Diagnosis not present

## 2024-01-15 MED ORDER — AMLODIPINE BESYLATE 2.5 MG PO TABS: 2.5000 mg | ORAL_TABLET | Freq: Every day | ORAL | 3 refills | Status: DC

## 2024-01-15 NOTE — Patient Instructions (Addendum)
 Medication Instructions:  Your physician has recommended you make the following change in your medication:   START: Amlodipine 2.5 mg daily  *If you need a refill on your cardiac medications before your next appointment, please call your pharmacy*  Lab Work: None If you have labs (blood work) drawn today and your tests are completely normal, you will receive your results only by: MyChart Message (if you have MyChart) OR A paper copy in the mail If you have any lab test that is abnormal or we need to change your treatment, we will call you to review the results.  Testing/Procedures: None  Follow-Up: At [facility], you and your health needs are our priority.  As part of our continuing mission to provide you with exceptional heart care, our providers are all part of one team.  This team includes your primary Cardiologist (physician) and Advanced Practice Providers or APPs (Physician Assistants and Nurse Practitioners) who all work together to provide you with the care you need, when you need it.  Your next appointment:   9 month(s)  Provider:   Belva Crome, MD    We recommend signing up for the patient portal called "MyChart".  Sign up information is provided on this After Visit Summary.  MyChart is used to connect with patients for Virtual Visits (Telemedicine).  Patients are able to view lab/test results, encounter notes, upcoming appointments, etc.  Non-urgent messages can be sent to your provider as well.   To learn more about what you can do with MyChart, go to [email or web address].   Other Instructions Please keep a BP log for 2 weeks and send by MyChart or mail.                          Name and DOB__________________________ Dr. Tomie China [address] [place], [place] [address]  Blood Pressure Record Sheet To take your blood pressure, you will need a blood pressure machine. You can buy a blood pressure machine (blood pressure monitor) at your clinic, drug store,  or online. When choosing one, consider: An automatic monitor that has an arm cuff. A cuff that wraps snugly around your upper arm. You should be able to fit only one finger between your arm and the cuff. A device that stores blood pressure reading results. Do not choose a monitor that measures your blood pressure from your wrist or finger. Follow your health care provider's instructions for how to take your blood pressure. To use this form: Get one reading in the morning (a.m.) 1-2 hours after you take any medicines. Get one reading in the evening (p.m.) before supper.   Blood pressure log Date: _______________________  a.m. _____________________(1st reading) HR___________            p.m. _____________________(2nd reading) HR__________  Date: _______________________  a.m. _____________________(1st reading) HR___________            p.m. _____________________(2nd reading) HR__________  Date: _______________________  a.m. _____________________(1st reading) HR___________            p.m. _____________________(2nd reading) HR__________  Date: _______________________  a.m. _____________________(1st reading) HR___________            p.m. _____________________(2nd reading) HR__________  Date: _______________________  a.m. _____________________(1st reading) HR___________            p.m. _____________________(2nd reading) HR__________  Date: _______________________  a.m. _____________________(1st reading) HR___________            p.m. _____________________(2nd reading) HR__________  Date: _______________________  a.m. _____________________(1st reading) HR___________            p.m. _____________________(2nd reading) HR__________   This information is not intended to replace advice given to you by your health care provider. Make sure you discuss any questions you have with your health care provider. Document Revised: 01/12/2020 Document Reviewed: 01/12/2020 Elsevier  Patient Education  2021 [employer].

## 2024-01-15 NOTE — Progress Notes (Signed)
 Cardiology Office Note:    Date:  01/15/2024   ID:  Joseph Lamb, DOB 1951/07/06, MRN [id number]  PCP:  Lupita Raider, MD  Cardiologist:  Garwin Brothers, MD   Referring MD: Lupita Raider, MD    ASSESSMENT:    1. Paroxysmal atrial fibrillation (HCC)   2. Essential hypertension   3. Obstructive sleep apnea   4. Mixed hyperlipidemia    PLAN:    In order of problems listed above:  Primary prevention stressed with the patient.  Importance of compliance with diet medication stressed and patient verbalized standing. Paroxysmal atrial fibrillation:I discussed with the patient atrial fibrillation, disease process. Management and therapy including rate and rhythm control, anticoagulation benefits and potential risks were discussed extensively with the patient. Patient had multiple questions which were answered to patient's satisfaction. Mixed dyslipidemia: On lipid-lowering medications followed by primary care.  Goal LDL less than 70. Borderline blood pressure: I will cut down amlodipine to half dose.  He mentions to me that he will keep a track of pulse blood pressure and send it to me in 2 weeks. Patient will be seen in follow-up appointment in 6 months or earlier if the patient has any concerns.    Medication Adjustments/Labs and Tests Ordered: Current medicines are reviewed at length with the patient today.  Concerns regarding medicines are outlined above.  No orders of the defined types were placed in this encounter.  No orders of the defined types were placed in this encounter.    No chief complaint on file.    History of Present Illness:    Joseph Lamb is a 73 y.o. male.  Patient has past medical history of paroxysmal atrial fibrillation, history of stroke, essential hypertension and mixed dyslipidemia.  He denies any problems at this time and takes care of activities of daily living.  No chest pain orthopnea or PND.  At the time of my evaluation, the patient  is alert awake oriented and in no distress.  He has very mildly elevated calcium score.  Past Medical History:  Diagnosis Date   Arrhythmia    Atrial fibrillation 10/11/2021   Cerebrovascular disease    moderate per 2021 MRI   Cholecystitis 09/01/2021   Chronic kidney disease, stage 3 unspecified 10/11/2021   Dizzy spells 03/08/2020   Erectile dysfunction 10/11/2021   Essential hypertension 06/14/2015   Essential tremor    Glaucoma 08/31/2021   Hyperlipidemia 06/14/2015   Irregular heart beat    Mild cognitive impairment of uncertain or unknown etiology 05/21/2023   Mixed hyperlipidemia    Multiple lacunar infarcts    bilateral centrum semiovale and right parietal cortex.   Obesity 10/11/2021   Obstructive sleep apnea    no CPAP   Other male erectile dysfunction    Primary open angle glaucoma (POAG) of left eye, moderate stage 10/11/2021   Primary open-angle glaucoma, right eye, mild stage 10/11/2021   Renal insufficiency 09/06/2022   Sinus bradycardia 03/08/2020   Tremor 10/11/2021   Type 2 diabetes mellitus with other specified complication (HCC)    Type II diabetes mellitus 10/11/2021   Vascular dementia [facility])     Past Surgical History:  Procedure Laterality Date   CHOLECYSTECTOMY N/A 08/31/2021   Procedure: LAPAROSCOPIC CHOLECYSTECTOMY;  Surgeon: Quentin Ore, MD;  Location: [facility] OR;  Service: General;  Laterality: N/A;   EYE SURGERY     Glaucoma surgery   PROSTATE SURGERY      Current Medications: Current Meds  Medication Sig  amLODipine (NORVASC) 5 MG tablet TAKE 1 TABLET BY MOUTH EVERY DAY   apixaban (ELIQUIS) 5 MG TABS tablet Take 1 tablet (5 mg total) by mouth 2 (two) times daily.   carbidopa-levodopa (SINEMET CR) 50-200 MG tablet Take 1 tablet by mouth at bedtime.   carbidopa-levodopa (SINEMET) 25-100 MG tablet Week 1: Take 1/2  tablet morning, 1/2 tab noon, and 1/2  tablet in evening/bedtime. Week 4  & thereafter: Take 1 tablet three times daily.    donepezil (ARICEPT) 5 MG tablet Take 1 tablet (5 mg total) by mouth at bedtime.   hydrochlorothiazide (HYDRODIURIL) 25 MG tablet Take 0.25 tablets by mouth at bedtime.   latanoprost (XALATAN) 0.005 % ophthalmic solution Place 1 drop into both eyes at bedtime.   pantoprazole (PROTONIX) 40 MG tablet Take 40 mg by mouth 2 (two) times daily.   pravastatin (PRAVACHOL) 40 MG tablet Take 1 tablet (40 mg total) by mouth daily.   Semaglutide,0.25 or 0.5MG /DOS, (OZEMPIC, 0.25 OR 0.5 MG/DOSE,) 2 MG/3ML SOPN Inject 0.5 mg into the skin once a week.   sucralfate (CARAFATE) 1 g tablet Take 1 g by mouth 2 (two) times daily.   tadalafil (CIALIS) 20 MG tablet Take 20 mg by mouth daily as needed for erectile dysfunction.     Allergies:   Lisinopril, Telmisartan, Crestor [rosuvastatin calcium], Rosuvastatin, and Simvastatin   Social History   Socioeconomic History   Marital status: Married    Spouse name: Not on file   Number of children: 2   Years of education: 15   Highest education level: Associate degree: occupational, Scientist, product/process development, or vocational program  Occupational History   Occupation: Retired    Comment: worked at [employer]  Tobacco Use   Smoking status: Never    Passive exposure: Never   Smokeless tobacco: Never  Vaping Use   Vaping status: Never Used  Substance and Sexual Activity   Alcohol use: Not Currently   Drug use: No   Sexual activity: Not Currently  Other Topics Concern   Not on file  Social History Narrative   Right handed   One story home   Drinks no caffeine, occ soda.   Lives with wife and son   Social Drivers of Corporate investment banker Strain: Not on file  Food Insecurity: Not on file  Transportation Needs: Not on file  Physical Activity: Not on file  Stress: Not on file  Social Connections: Not on file     Family History: The patient's family history includes Diabetes in his father and mother; Tremor in his mother.  ROS:   Please see the  history of present illness.    All other systems reviewed and are negative.  EKGs/Labs/Other Studies Reviewed:    The following studies were reviewed today: I discussed my findings with the patient at length   Recent Labs: No results found for requested labs within last 365 days.  Recent Lipid Panel No results found for: "CHOL", "TRIG", "HDL", "CHOLHDL", "VLDL", "LDLCALC", "LDLDIRECT"  Physical Exam:    VS:  BP (!) 90/46   Pulse (!) 54   Ht 6\' 1"  (1.854 m)   Wt 222 lb (100.7 kg)   SpO2 96%   BMI 29.29 kg/m     Wt Readings from Last 3 Encounters:  01/15/24 222 lb (100.7 kg)  01/03/24 220 lb (99.8 kg)  09/15/23 214 lb 12.8 oz (97.4 kg)     GEN: Patient is in no acute distress HEENT: Normal NECK: No  JVD; No carotid bruits LYMPHATICS: No lymphadenopathy CARDIAC: Hear sounds regular, 2/6 systolic murmur at the apex. RESPIRATORY:  Clear to auscultation without rales, wheezing or rhonchi  ABDOMEN: Soft, non-tender, non-distended MUSCULOSKELETAL:  No edema; No deformity  SKIN: Warm and dry NEUROLOGIC:  Alert and oriented x 3 PSYCHIATRIC:  Normal affect   Signed, Garwin Brothers, MD  01/15/2024 10:47 AM    [facility]

## 2024-02-09 ENCOUNTER — Telehealth: Payer: Self-pay | Admitting: Cardiology

## 2024-02-09 NOTE — Telephone Encounter (Signed)
 New Message   Pt wants to switch from Dr. Lafayette Pierre to Dr. Addie Holstein.   Is this okay? He is due for a follow up in 9 months (Jan 2026)

## 2024-02-12 NOTE — Telephone Encounter (Signed)
 Spinal MEE activated this means that he comes to the [facility] or [place] office.

## 2024-02-16 NOTE — Therapy (Unsigned)
 Occupational Therapy Parkinson's Disease Screen  Hand dominance:  Right   Physical Performance Test item #2 (simulated eating):  13.72 sec (slight regression)  Physical Performance Test item #4 (donning/doffing jacket):  11 sec (improvement)  Fastening/unfastening 3 buttons in:  1:43 sec (regression)  9-hole peg test:    NT  Box & Blocks Test:   RUE  46 blocks     LUE  51 blocks (no change)   Change in ability to perform ADLs/IADLs:  Pt reports more difficulty with buttons getting ready to go to church  Pt would benefit from occupational therapy evaluation due to  difficulty with buttoning and review of HEP

## 2024-02-17 ENCOUNTER — Other Ambulatory Visit: Payer: Self-pay

## 2024-02-17 ENCOUNTER — Ambulatory Visit: Payer: Medicare Other | Admitting: Occupational Therapy

## 2024-02-17 ENCOUNTER — Telehealth: Payer: Self-pay | Admitting: Physical Therapy

## 2024-02-17 ENCOUNTER — Ambulatory Visit: Payer: Medicare Other | Attending: Family Medicine

## 2024-02-17 ENCOUNTER — Ambulatory Visit: Payer: Medicare Other | Admitting: Physical Therapy

## 2024-02-17 DIAGNOSIS — G20A1 Parkinson's disease without dyskinesia, without mention of fluctuations: Secondary | ICD-10-CM

## 2024-02-17 DIAGNOSIS — M6281 Muscle weakness (generalized): Secondary | ICD-10-CM | POA: Insufficient documentation

## 2024-02-17 DIAGNOSIS — R251 Tremor, unspecified: Secondary | ICD-10-CM | POA: Insufficient documentation

## 2024-02-17 DIAGNOSIS — R29898 Other symptoms and signs involving the musculoskeletal system: Secondary | ICD-10-CM | POA: Insufficient documentation

## 2024-02-17 DIAGNOSIS — R29818 Other symptoms and signs involving the nervous system: Secondary | ICD-10-CM

## 2024-02-17 DIAGNOSIS — R278 Other lack of coordination: Secondary | ICD-10-CM | POA: Insufficient documentation

## 2024-02-17 DIAGNOSIS — R2681 Unsteadiness on feet: Secondary | ICD-10-CM | POA: Insufficient documentation

## 2024-02-17 DIAGNOSIS — R471 Dysarthria and anarthria: Secondary | ICD-10-CM | POA: Insufficient documentation

## 2024-02-17 DIAGNOSIS — R41840 Attention and concentration deficit: Secondary | ICD-10-CM | POA: Insufficient documentation

## 2024-02-17 NOTE — Therapy (Signed)
 [facility] [address] [place], [place], [address] Phone: [phone]   Fax:  [phone]  Patient Details  Name: Joseph Lamb MRN: [id number] Date of Birth: 04-27-1951 Referring Provider: Shirline Dover, DO   Encounter Date: 02/17/2024  Speech Therapy Parkinson's Disease Screen   Decibel Level today: 68 dB  (WNL=70-72 dB) with sound level meter 30cm away from pt's mouth. Pt's conversational volume has mildly declined since last treatment course.  Pt does report occasional difficulty with swallowing (managing saliva and pills), which may warrant further evaluation. Endorsed changes in cognition, including reduced short term recall. Needs reminders from wife to complete to-do list.   Pt would benefit from speech-language eval for cognition, dysarthria, and bedside swallow eval - please order via EPIC or call [phone] to schedule   Tamar Fairly, CCC-SLP 02/17/2024, 10:16 AM  [facility] [address] [place], [place], [address] Phone: [phone]   Fax:  [phone]

## 2024-02-17 NOTE — Therapy (Signed)
 [facility] [address] [place], [place], [address] Phone: [phone]   Fax:  [phone]  Patient Details  Name: ALVE SKENANDORE MRN: [id number] Date of Birth: [birth year] Referring Provider:  Glena Landau, MD  Encounter Date: 02/17/2024  Physical Therapy Parkinson's Disease Screen   Timed Up and Go test:8.19 seconds (previously 7.7s)   10 meter walk test:7.65 seconds =  4.28 ft/s (previously 3.01 ft/s)   5 time sit to stand test:11.07 seconds (previously 11.3s)    Pt reports doing pretty well. Is doing his finger exercises and his standing PWR moves. Thinks his balance has declined a bit, feels unsteady at times, especially first thing in the morning. Still having difficulty getting into/out of car   Patient would benefit from Physical Therapy evaluation due to increased difficulty getting out the car and pt request.     Dacia Capers E Livian Vanderbeck, PT, DPT 02/17/2024, 10:26 AM  [facility] [address] [place], [place], [address] Phone: [phone]   Fax:  [phone]

## 2024-02-17 NOTE — Telephone Encounter (Signed)
 Dr. Winferd Hatter,  Mardeen Shackleton  was seen for PD screens on 02/17/2024.  The patient would benefit from OT, PT, and ST evaluations for PD.    If you agree, please place an order in [facility] workque in [facility] or fax the order to [phone].  Thank you, Jo Mouse, PT, DPT [facility] [address] [place], [place]  [address] Phone:  [phone] Fax:  [phone]

## 2024-02-20 ENCOUNTER — Other Ambulatory Visit: Payer: Self-pay | Admitting: Family Medicine

## 2024-02-20 DIAGNOSIS — R109 Unspecified abdominal pain: Secondary | ICD-10-CM

## 2024-03-11 ENCOUNTER — Ambulatory Visit
Admission: RE | Admit: 2024-03-11 | Discharge: 2024-03-11 | Disposition: A | Discharge status: Home / Self Care | Source: Ambulatory Visit | Attending: Family Medicine | Admitting: Family Medicine

## 2024-03-11 DIAGNOSIS — R109 Unspecified abdominal pain: Secondary | ICD-10-CM

## 2024-03-11 MED ORDER — IOPAMIDOL (ISOVUE-300) INJECTION 61%
75.0000 mL | Freq: Once | INTRAVENOUS | Status: AC | PRN
Start: 2024-03-11 — End: 2024-03-11
  Administered 2024-03-11: 75 mL via INTRAVENOUS

## 2024-03-11 NOTE — Progress Notes (Signed)
 Assessment/Plan:   1.  Idiopathic Parkinson's disease, moderate             -Diagnosis was recently made, but likely has had this for a number of years             -He has been off of  carbidopa /levodopa  25/100, 1 tablet 3 times per day x 1 month and he needs to get back on it.  He is more rigid now that off of it and has internal tremor again.  I suspect he has been off of this for a while given number of refills originally given.  Talked about importance of compliance.  -continue carbidopa /levodopa  50/200 CR at bedtime for cramping and first morning on.  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  -informational materials given.  Encouraged support group       2.  Memory change             -Neurocognitive testing in February, 2024 with evidence of mild cognitive impairment.  This was largely stable compared to prior testing with Dr. Annette Barters in 2021.             -Dr. Kitty Perkins noted that the patient was on donepezil , but felt that the medication was not necessary currently.  Patient has had intermittent bradycardia, and he and I discussed that donepezil  can contribute to this (not sure that this is entirely the cause but his HR is very low).  His primary care is prescribing this, and I recommended today that perhaps we go ahead and hold that since it is likely not needed right now anyway.   3.  Sialorrhea             -This is commonly associated with PD.  We talked about treatments.  The patient is not a candidate for oral anticholinergic therapy because of increased risk of confusion and falls.  We discussed Botox (type A and B) and 1% atropine drops.  We discusssed that candy like lemon drops can help by stimulating mm of the oropharynx to induce swallowing.  He wants to hold on this for now.     4.  Mood change             -Wife asks about lorazepam, but I do not recommend this regularly in a Parkinson's population due to fall risk.             -declines  SSRI   Subjective:   Joseph Lamb was seen today in follow up for Parkinsons disease. Pt with wife who supplements hx.   My previous records were reviewed prior to todays visit as well as outside records available to me.  Patient is supposed to be on carbidopa /levodopa  25/100, 3 times per day and last visit we added bedtime CR levodopa  for first morning on and cramping.  He reports that he ran out of the IR a month ago and didn't know if he had refills so he stopped it - "I was dreading coming."  Patient was in the urgent care a few months ago with swelling and found to be bradycardic.  Review of flowsheets indicate patient has had intermittent bradycardia over the course of time.  Current prescribed movement disorder medications: carbidopa /levodopa  25/100, 1 po tid Carbidopa /levodopa  50/200 CR at bedtime (added last visit)   ALLERGIES:   Allergies  Allergen Reactions   Lisinopril Cough   Telmisartan Other (See Comments)    Other reaction(s): decreased kidney functions  Crestor [Rosuvastatin Calcium]     " fatigue"    Rosuvastatin Other (See Comments)    Other reaction(s): fatigue   Simvastatin Other (See Comments)    "fatigue"    CURRENT MEDICATIONS:  Current Meds  Medication Sig   amLODipine  (NORVASC ) 2.5 MG tablet Take 1 tablet (2.5 mg total) by mouth daily.   apixaban  (ELIQUIS ) 5 MG TABS tablet Take 1 tablet (5 mg total) by mouth 2 (two) times daily.   carbidopa -levodopa  (SINEMET  CR) 50-200 MG tablet Take 1 tablet by mouth at bedtime.   carbidopa -levodopa  (SINEMET ) 25-100 MG tablet Week 1: Take 1/2  tablet morning, 1/2 tab noon, and 1/2  tablet in evening/bedtime. Week 4  & thereafter: Take 1 tablet three times daily.   donepezil  (ARICEPT ) 5 MG tablet Take 1 tablet (5 mg total) by mouth at bedtime.   hydrochlorothiazide  (HYDRODIURIL ) 25 MG tablet Take 0.25 tablets by mouth at bedtime.   latanoprost  (XALATAN ) 0.005 % ophthalmic solution Place 1 drop into both eyes at  bedtime.   pantoprazole  (PROTONIX ) 40 MG tablet Take 40 mg by mouth 2 (two) times daily.   pravastatin  (PRAVACHOL ) 40 MG tablet Take 1 tablet (40 mg total) by mouth daily.   Semaglutide,0.25 or 0.5MG /DOS, (OZEMPIC, 0.25 OR 0.5 MG/DOSE,) 2 MG/3ML SOPN Inject 0.5 mg into the skin once a week.   sucralfate (CARAFATE) 1 g tablet Take 1 g by mouth 2 (two) times daily.   tadalafil (CIALIS) 20 MG tablet Take 20 mg by mouth daily as needed for erectile dysfunction.     Objective:   PHYSICAL EXAMINATION:    VITALS:   Vitals:   03/16/24 0829  BP: 116/78  Pulse: (!) 43  SpO2: 96%  Weight: 221 lb 9.6 oz (100.5 kg)     GEN:  The patient appears stated age and is in NAD. HEENT:  Normocephalic, atraumatic.  The mucous membranes are moist. The superficial temporal arteries are without ropiness or tenderness.   Neurological examination:  Orientation: The patient is alert and oriented x3. Cranial nerves: There is good facial symmetry with  facial hypomimia. The speech is fluent and clear. Soft palate rises symmetrically and there is no tongue deviation. Hearing is intact to conversational tone. Sensation: Sensation is intact to light touch throughout Motor: Strength is at least antigravity x4.  Movement examination: Tone: There is mild increased tone on the L.  Tone is normal on the right Abnormal movements: mild to mod rest tremor on the LUE; more rare tremor in the RUE Coordination:  There is no decremation on the R and mild on the L Gait and Station: The patient has no difficulty arising out of a deep-seated chair without the use of the hands. The patient's stride length is just slightly decreased with bilateral UE rest tremor.  I have reviewed and interpreted the following labs independently    Chemistry      Component Value Date/Time   NA 140 09/02/2021 0328   K 3.8 09/02/2021 0328   CL 106 09/02/2021 0328   CO2 29 09/02/2021 0328   BUN 14 09/02/2021 0328   CREATININE 1.47 (H)  09/02/2021 0328      Component Value Date/Time   CALCIUM 8.6 (L) 09/02/2021 0328   ALKPHOS 75 09/02/2021 0328   AST 37 09/02/2021 0328   ALT 53 (H) 09/02/2021 0328   BILITOT 0.8 09/02/2021 0328       Lab Results  Component Value Date   WBC 11.1 (H) 09/02/2021   HGB  13.7 09/02/2021   HCT 42.5 09/02/2021   MCV 86.2 09/02/2021   PLT 189 09/02/2021    Lab Results  Component Value Date   TSH 3.440 07/24/2017     Total time spent on today's visit was 40 minutes, including both face-to-face time and nonface-to-face time.  Time included that spent on review of records (prior notes available to me/labs/imaging if pertinent), discussing treatment and goals, answering patient's questions and coordinating care.  Cc:  Glena Landau, MD

## 2024-03-16 ENCOUNTER — Ambulatory Visit: Payer: Medicare Other | Admitting: Podiatry

## 2024-03-16 ENCOUNTER — Encounter: Payer: Self-pay | Admitting: Neurology

## 2024-03-16 ENCOUNTER — Ambulatory Visit: Payer: Medicare Other | Admitting: Neurology

## 2024-03-16 VITALS — BP 116/78 | HR 43 | Wt 221.6 lb

## 2024-03-16 DIAGNOSIS — R001 Bradycardia, unspecified: Secondary | ICD-10-CM | POA: Diagnosis not present

## 2024-03-16 DIAGNOSIS — G20A1 Parkinson's disease without dyskinesia, without mention of fluctuations: Secondary | ICD-10-CM | POA: Diagnosis not present

## 2024-03-16 MED ORDER — CARBIDOPA-LEVODOPA 25-100 MG PO TABS: 1.0000 | ORAL_TABLET | Freq: Three times a day (TID) | ORAL | 1 refills | Status: DC

## 2024-03-16 NOTE — Patient Instructions (Addendum)
 STOP donepezil  (it can lower heart rate and yours is already low) TAKE carbidopa /levodopa  25/100 at 7am/11am/4pm TAKE carbidopa /levodopa  50/200 at bedtime  SAVE THE DATE!  We are planning a Parkinsons Disease educational symposium at [facility] in [place] on September 19.  More details to come!  We will have a movement disorder physician expert from [employer] coming to speak and a caregiver speaker.  We will have a panel of experts that will show you who you may need on your "team" of people on your journey with Parkinsons.  If you would like to be added to our email list to get further information, email [email or web address].  I hope to see you there!

## 2024-03-17 ENCOUNTER — Encounter: Admitting: Speech Pathology

## 2024-03-17 ENCOUNTER — Ambulatory Visit: Admitting: Physical Therapy

## 2024-03-17 ENCOUNTER — Encounter: Admitting: Occupational Therapy

## 2024-03-26 ENCOUNTER — Other Ambulatory Visit: Payer: Self-pay | Admitting: Neurology

## 2024-04-12 NOTE — Therapy (Signed)
 OUTPATIENT OCCUPATIONAL THERAPY PARKINSON'S EVALUATION  Patient Name: Joseph Lamb MRN: [id number] DOB:09-Feb-1951, 73 y.o., male Today's Date: 04/13/2024  PCP: Loreli Kins, MD REFERRING PROVIDER: Evonnie Asberry RAMAN, DO  END OF SESSION:  OT End of Session - 04/13/24 1059     Visit Number 1    Number of Visits 17    Date for OT Re-Evaluation 06/14/24    Authorization Type [employer] MCR - Auth required    OT Start Time 0930    OT Stop Time 1015    OT Time Calculation (min) 45 min    Activity Tolerance Patient tolerated treatment well    Behavior During Therapy [facility] for tasks assessed/performed          Past Medical History:  Diagnosis Date   Arrhythmia    Atrial fibrillation 10/11/2021   Cerebrovascular disease    moderate per 2021 MRI   Cholecystitis 09/01/2021   Chronic kidney disease, stage 3 unspecified 10/11/2021   Dizzy spells 03/08/2020   Erectile dysfunction 10/11/2021   Essential hypertension 06/14/2015   Essential tremor    Glaucoma 08/31/2021   Hyperlipidemia 06/14/2015   Irregular heart beat    Mild cognitive impairment of uncertain or unknown etiology 05/21/2023   Mixed hyperlipidemia    Multiple lacunar infarcts    bilateral centrum semiovale and right parietal cortex.   Obesity 10/11/2021   Obstructive sleep apnea    no CPAP   Other male erectile dysfunction    Primary open angle glaucoma (POAG) of left eye, moderate stage 10/11/2021   Primary open-angle glaucoma, right eye, mild stage 10/11/2021   Renal insufficiency 09/06/2022   Sinus bradycardia 03/08/2020   Tremor 10/11/2021   Type 2 diabetes mellitus with other specified complication (HCC)    Type II diabetes mellitus 10/11/2021   Vascular dementia [facility])    Past Surgical History:  Procedure Laterality Date   CHOLECYSTECTOMY N/A 08/31/2021   Procedure: LAPAROSCOPIC CHOLECYSTECTOMY;  Surgeon: Lyndel Deward PARAS, MD;  Location: [facility] OR;  Service: General;  Laterality: N/A;   EYE SURGERY      Glaucoma surgery   PROSTATE SURGERY     Patient Active Problem List   Diagnosis Date Noted   Essential tremor    Mixed hyperlipidemia    Irregular heart beat    Other male erectile dysfunction    Type 2 diabetes mellitus with other specified complication (HCC)    Vascular dementia (HCC)    Mild cognitive impairment of uncertain or unknown etiology 05/21/2023   Obstructive sleep apnea    Multiple lacunar infarcts    Cerebrovascular disease    Renal insufficiency 09/06/2022   Atrial fibrillation 10/11/2021   Chronic kidney disease, stage 3 unspecified 10/11/2021   Obesity 10/11/2021   Erectile dysfunction 10/11/2021   Primary open angle glaucoma (POAG) of left eye, moderate stage 10/11/2021   Primary open-angle glaucoma, right eye, mild stage 10/11/2021   Tremor 10/11/2021   Type II diabetes mellitus 10/11/2021   Cholecystitis 09/01/2021   Glaucoma 08/31/2021   Arrhythmia    Dizzy spells 03/08/2020   Sinus bradycardia 03/08/2020   Essential hypertension 06/14/2015   Hyperlipidemia 06/14/2015    ONSET DATE: 02/17/2024 (referral date)   REFERRING DIAG: G20.A1 (ICD-10-CM) - Parkinson's disease without dyskinesia or fluctuating manifestations (HCC)  THERAPY DIAG:  Other lack of coordination  Other symptoms and signs involving the musculoskeletal system  Other symptoms and signs involving the nervous system  Attention and concentration deficit  Muscle weakness (generalized)  Unsteadiness on feet  Tremors of nervous system  Rationale for Evaluation and Treatment: Rehabilitation  SUBJECTIVE:   SUBJECTIVE STATEMENT: I stopped taking the Aricept  d/t MD recommendations because it was interfering with my heart Pt accompanied by: self  PERTINENT HISTORY: PMH: PD (diagnosed 2024), A fb, stage 3 CKD, HLD, HTN, multiple lacunar infarcts, obesity, type II diabetes   PRECAUTIONS: Fall  WEIGHT BEARING RESTRICTIONS: No  PAIN:  Are you having pain? No except sometimes low  back pain  FALLS: Has patient fallen in last 6 months? No  LIVING ENVIRONMENT: Lives with: lives with their spouse and lives with their son Lives in: 1 story home Stairs: Yes: External: 3 steps; bilateral but cannot reach both Has following equipment at home: shower chair and Grab bars  PLOF: Independent with basic ADLs  PATIENT GOALS: just go with the flow  OBJECTIVE:  Note: Objective measures were completed at Evaluation unless otherwise noted.  HAND DOMINANCE: Right  ADLs: Eating: mod I  - pt reports occasional tremors Grooming: mod I  UB Dressing: wife does buttons on dress shirt LB Dressing: mod I - slower  Toileting: mod I  Bathing: mod I (getting down in tub for bathing) Tub Shower transfers: mod I  Equipment: Grab bars  IADLs: Shopping: wife mostly does, but pt will sometimes accompany her. Pt will occasionally go to get smaller purchases Light housekeeping: pt does some laundry and washing dishes, sweeping Meal Prep: wife mostly does (pt every once in awhile does breakfast)  Community mobility: pt drives short distances (avoids highway), wife drives mostly Medication management: pt doing Landscape architect: wife does Handwriting: 100% legible and Mild micrographia w/ decline in legibility if writing longer  MOBILITY STATUS: Independent   FUNCTIONAL OUTCOME MEASURES: Fastening/unfastening 3 buttons: 2 min. 21 sec (w/o glasses and reports difficulty seeing buttons) Physical performance test: PPT#2 (simulated eating) 12.95 sec (holding spoon at end)  & PPT#4 (donning/doffing jacket): 20.22 sec (w/ hospital gown)   COORDINATION: 9 Hole Peg test: Right: 38.28 sec; Left: 47.56 sec Box and Blocks:  Right 47 blocks, Left 45 blocks Tremors: Resting and Right  UE ROM:  WFL   SENSATION: Pt reports diminished RUE   COGNITION: Overall cognitive status: Impaired and decreased processing, however pt more alert and participatory for today's  evaluation  OBSERVATIONS: Bradykinesia, Hypokinesia, and Rt resting tremors, decreased vision d/t glaucoma                                                                                                                    TREATMENT DATE: 04/13/24  Discussed O.T. findings and POC/goals     PATIENT EDUCATION: Education details: see above Person educated: Patient Education method: Explanation Education comprehension: verbalized understanding  HOME EXERCISE PROGRAM: N/A  GOALS: Goals reviewed with patient? Yes  SHORT TERM GOALS: Target date: 05/14/24  Independent with PD specific HEP  (review and update prn)  Baseline: Goal status: INITIAL  2.  Pt will verbalize understanding of adaptive strategies to increase ease with ADLS/IADLS   Baseline:  Goal status: INITIAL  3.  Pt will demonstrate increased ease with dressing as evidenced by decreasing PPT#4 (don/ doff jacket) to 15 secs or less  Baseline: 20.22 sec w/ hospital gown Goal status: INITIAL  4.  Pt will demonstrate improved ease with fastening buttons as evidenced by decreasing 3 button/unbutton time by 30 seconds or more wearing his glasses Baseline: 2 min and 21 sec (w/o glasses), 1:43 at screen 02/17/24 Goal status: INITIAL  5.  Pt will write a paragraph with no significant decrease in size and maintain 100% legibility  Baseline:  Goal status: INITIAL   LONG TERM GOALS: Target date: 06/14/24  Pt will verbalize understanding of ways to prevent future PD related complications and appropriate community resources prn  Baseline:  Goal status: INITIAL  2.  Pt will verbalize understanding of ways to keep thinking skills sharp and ways to compensate for STM changes  Baseline:  Goal status: INITIAL  3.  Pt will demonstrate improved fine motor coordination for ADLs as evidenced by decreasing 9 hole peg test score for Rt hand by 4 secs  Baseline: 38.28 sec Goal status: INITIAL  4.  Pt will demonstrate improved fine motor  coordination for ADLs as evidenced by decreasing 9 hole peg test score for Lt hand by 7 secs or more Baseline: 47.56 sec Goal status: INITIAL  5.  Pt will demonstrate improved ease with fastening buttons as evidenced by decreasing 3 button/unbutton time by 40 seconds with glasses Baseline: 2 min, 21 sec  Goal status: INITIAL  ASSESSMENT:  CLINICAL IMPRESSION: Patient is a 73 y.o. male who was seen today for occupational therapy evaluation for Parkinson's disease. Hx includes PD, A fb, stage 3 CKD, HLD, HTN, multiple lacunar infarcts, obesity, type II diabetes . Patient currently presents below baseline level of functioning demonstrating decline in coordination, resting tremor, and overall slower with ADLS including buttons. Pt would benefit from skilled OT services in the outpatient setting to work on impairments as noted to help pt improve efficiency with ADLS, review PD specific HEP, and prevent future PD related complications.  PERFORMANCE DEFICITS: in functional skills including ADLs, IADLs, coordination, dexterity, sensation, tone, ROM, strength, Fine motor control, Gross motor control, mobility, balance, body mechanics, and UE functional use, cognitive skills including problem solving and safety awareness, and psychosocial skills including coping strategies.   IMPAIRMENTS: are limiting patient from ADLs, IADLs, rest and sleep, leisure, and social participation.   COMORBIDITIES:  may have co-morbidities  that affects occupational performance. Patient will benefit from skilled OT to address above impairments and improve overall function.  MODIFICATION OR ASSISTANCE TO COMPLETE EVALUATION: No modification of tasks or assist necessary to complete an evaluation.  OT OCCUPATIONAL PROFILE AND HISTORY: Detailed assessment: Review of records and additional review of physical, cognitive, psychosocial history related to current functional performance.  CLINICAL DECISION MAKING: Moderate - several  treatment options, min-mod task modification necessary  REHAB POTENTIAL: Good  EVALUATION COMPLEXITY: Moderate    PLAN:  OT FREQUENCY: 2x/week  OT DURATION: 8 weeks  PLANNED INTERVENTIONS: 97535 self care/ADL training, 02889 therapeutic exercise, 97530 therapeutic activity, 97112 neuromuscular re-education, 97140 manual therapy, 97113 aquatic therapy, 97039 fluidotherapy, 97760 Orthotic Initial, [id number] Orthotic/Prosthetic subsequent, passive range of motion, functional mobility training, visual/perceptual remediation/compensation, coping strategies training, patient/family education, and DME and/or AE instructions  RECOMMENDED OTHER SERVICES: none at this time  CONSULTED AND AGREED WITH PLAN OF CARE: Patient  PLAN FOR NEXT SESSION: review/update PD HEP's prn,  if pt brings in glasses -  reassess buttons, if pt brings in his own jacket - reassess PPT #4   Burnard JINNY Roads, OT 04/13/2024, 11:01 AM

## 2024-04-13 ENCOUNTER — Ambulatory Visit: Admitting: Occupational Therapy

## 2024-04-13 ENCOUNTER — Ambulatory Visit: Attending: Neurology | Admitting: Speech Pathology

## 2024-04-13 ENCOUNTER — Encounter: Payer: Self-pay | Admitting: Occupational Therapy

## 2024-04-13 ENCOUNTER — Ambulatory Visit: Admitting: Physical Therapy

## 2024-04-13 ENCOUNTER — Encounter: Payer: Self-pay | Admitting: Speech Pathology

## 2024-04-13 ENCOUNTER — Encounter: Payer: Self-pay | Admitting: Physical Therapy

## 2024-04-13 VITALS — BP 139/69 | HR 52

## 2024-04-13 DIAGNOSIS — R278 Other lack of coordination: Secondary | ICD-10-CM | POA: Insufficient documentation

## 2024-04-13 DIAGNOSIS — M6281 Muscle weakness (generalized): Secondary | ICD-10-CM

## 2024-04-13 DIAGNOSIS — R2689 Other abnormalities of gait and mobility: Secondary | ICD-10-CM

## 2024-04-13 DIAGNOSIS — R41840 Attention and concentration deficit: Secondary | ICD-10-CM

## 2024-04-13 DIAGNOSIS — R2681 Unsteadiness on feet: Secondary | ICD-10-CM

## 2024-04-13 DIAGNOSIS — R29818 Other symptoms and signs involving the nervous system: Secondary | ICD-10-CM

## 2024-04-13 DIAGNOSIS — R251 Tremor, unspecified: Secondary | ICD-10-CM | POA: Insufficient documentation

## 2024-04-13 DIAGNOSIS — R471 Dysarthria and anarthria: Secondary | ICD-10-CM | POA: Insufficient documentation

## 2024-04-13 DIAGNOSIS — R1310 Dysphagia, unspecified: Secondary | ICD-10-CM | POA: Insufficient documentation

## 2024-04-13 DIAGNOSIS — R293 Abnormal posture: Secondary | ICD-10-CM

## 2024-04-13 DIAGNOSIS — R29898 Other symptoms and signs involving the musculoskeletal system: Secondary | ICD-10-CM | POA: Insufficient documentation

## 2024-04-13 DIAGNOSIS — G20A1 Parkinson's disease without dyskinesia, without mention of fluctuations: Secondary | ICD-10-CM | POA: Diagnosis not present

## 2024-04-13 NOTE — Therapy (Incomplete)
 OUTPATIENT SPEECH LANGUAGE PATHOLOGY PARKINSON'S EVALUATION   Patient Name: Joseph Lamb MRN: [id number] DOB:[birth year], 73 y.o., male Today's Date: 04/13/2024  PCP: Dr. Joen Gentry REFERRING PROVIDER: Dr. Asberry Tat  END OF SESSION:  End of Session - 04/13/24 1016     Visit Number 1    Number of Visits 9    Date for SLP Re-Evaluation 06/22/24   to accomodate scheduling   SLP Start Time 1014    SLP Stop Time  1100    SLP Time Calculation (min) 46 min    Activity Tolerance Patient tolerated treatment well          Past Medical History:  Diagnosis Date   Arrhythmia    Atrial fibrillation 10/11/2021   Cerebrovascular disease    moderate per 2021 MRI   Cholecystitis 09/01/2021   Chronic kidney disease, stage 3 unspecified 10/11/2021   Dizzy spells 03/08/2020   Erectile dysfunction 10/11/2021   Essential hypertension 06/14/2015   Essential tremor    Glaucoma 08/31/2021   Hyperlipidemia 06/14/2015   Irregular heart beat    Mild cognitive impairment of uncertain or unknown etiology 05/21/2023   Mixed hyperlipidemia    Multiple lacunar infarcts    bilateral centrum semiovale and right parietal cortex.   Obesity 10/11/2021   Obstructive sleep apnea    no CPAP   Other male erectile dysfunction    Primary open angle glaucoma (POAG) of left eye, moderate stage 10/11/2021   Primary open-angle glaucoma, right eye, mild stage 10/11/2021   Renal insufficiency 09/06/2022   Sinus bradycardia 03/08/2020   Tremor 10/11/2021   Type 2 diabetes mellitus with other specified complication (HCC)    Type II diabetes mellitus 10/11/2021   Vascular dementia [facility])    Past Surgical History:  Procedure Laterality Date   CHOLECYSTECTOMY N/A 08/31/2021   Procedure: LAPAROSCOPIC CHOLECYSTECTOMY;  Surgeon: Lyndel Deward PARAS, MD;  Location: [facility] OR;  Service: General;  Laterality: N/A;   EYE SURGERY     Glaucoma surgery   PROSTATE SURGERY     Patient Active Problem List    Diagnosis Date Noted   Essential tremor    Mixed hyperlipidemia    Irregular heart beat    Other male erectile dysfunction    Type 2 diabetes mellitus with other specified complication (HCC)    Vascular dementia (HCC)    Mild cognitive impairment of uncertain or unknown etiology 05/21/2023   Obstructive sleep apnea    Multiple lacunar infarcts    Cerebrovascular disease    Renal insufficiency 09/06/2022   Atrial fibrillation 10/11/2021   Chronic kidney disease, stage 3 unspecified 10/11/2021   Obesity 10/11/2021   Erectile dysfunction 10/11/2021   Primary open angle glaucoma (POAG) of left eye, moderate stage 10/11/2021   Primary open-angle glaucoma, right eye, mild stage 10/11/2021   Tremor 10/11/2021   Type II diabetes mellitus 10/11/2021   Cholecystitis 09/01/2021   Glaucoma 08/31/2021   Arrhythmia    Dizzy spells 03/08/2020   Sinus bradycardia 03/08/2020   Essential hypertension 06/14/2015   Hyperlipidemia 06/14/2015    ONSET DATE: 02/17/24 referral date   REFERRING DIAG: G20.A1 (ICD-10-CM) - Parkinson's disease without dyskinesia or fluctuating manifestations (HCC)   THERAPY DIAG:  Dysarthria and anarthria  Rationale for Evaluation and Treatment: Rehabilitation  SUBJECTIVE:   SUBJECTIVE STATEMENT: *** Pt accompanied by: self  PERTINENT HISTORY: recently dx with PD with rigidity, tremor, memory change, sialorrhea, mood change.   PAIN:  Are you having pain? Mild discomfort endorsed, denied pain  FALLS: Has patient fallen in last 6 months?  See PT evaluation for details  LIVING ENVIRONMENT: Lives with: lives with their spouse Lives in: House/apartment  PLOF:  Level of assistance: Needed assistance with IADLS Employment: Retired  PATIENT GOALS: improved communication  OBJECTIVE:  Note: Objective measures were completed at Evaluation unless otherwise noted.  COGNITION: Overall cognitive status: Impaired Areas of impairment: {cognitive  impairment:24009} Comments: Mild cognitive impairment per neuropsych assessment ***  ORAL MOTOR EXAMINATION: Overall status: WFL  MOTOR SPEECH EVALUATON assessed across variety of speech tasks: reading, word repetition, generative discourse sample Overall motor speech: impaired Level of impairment: Word Rate of Speech: Reduced Dysfluencies: none evidenced Phonation: low vocal intensity Conversational loudness average: 65 dB Voice Quality: normal Respiration: thoracic breathing Word and Phrasal Stress: reduced use of stress Resonance: WFL Articulation: Appears intact Diadochokinetic Rate (DDK): poorly sequenced Intelligibility: Intelligible Motor planning: Appears intact Effective technique: increased vocal intensity and intentional speech  Stimulability trials: Given SLP modeling and occasional min cues, loudness average increased to 72 dB at the reading (paragraph) level.  Comments: Pt with teach back of intentional speech at onset of session.   Completed audio recording of patients baseline voice without cueing from SLP: No, pt did not have phone with him  DYSPHAGIA EVALUATION SUBJECTIVE DYSPHAGIA REPORTS:  Date of onset: *** Reported symptoms: {dysphagia symptoms:29766}  Current diet: {slpdiet:27196}  Co-morbid voice changes: {yes/no:20286}  FACTORS WHICH MAY INCREASE RISK OF ADVERSE EVENT IN PRESENCE OF ASPIRATION:  General health: {CSE general health:29764}  Risk factors: {CSE risk factors:29763}    CLINICAL SWALLOW ASSESSMENT:   Dentition: {CSE dentition:29769} Vocal quality at baseline: {VQL:27192} Patient directly observed with POs: {POobserved:27199} Feeding: {slp feeding:27200} Liquids provided by: {SLPliquids:27201} Yale Swallow Protocol: {Pass/Fail (Optional):210140017} Oral phase signs and symptoms: {SLPoralphase:27202} Pharyngeal phase signs and symptoms: {SLPpharyngealphase:27203}                                                                                                                             TREATMENT DATE:  04/13/24:Target improving vocal quality and increasing intensity through progressively difficulty speech tasks using Speak Out! program, lesson 1. ST leads pt through exercises providing usual model prior to pt execution. rare min-A required to achieve target dB this date. Averages this date:  Sustained ah 88-92 dB Counting 89 dB Reading 85 dB Cognitive speech task 83 dB  Conversational sample of approx 5 minutes, pt averages 69 dB with occasional mod-A.   PATIENT EDUCATION: Education details: POC, goals, HEP Person educated: Patient Education method: Medical illustrator Education comprehension: verbalized understanding, returned demonstration, and needs further education  HOME EXERCISE PROGRAM: Speak Out BID   GOALS: Goals reviewed with patient? Yes  SHORT TERM GOALS: Target date: 05/18/2024  *** Baseline: Goal status: INITIAL  2.  *** Baseline:  Goal status: INITIAL  3.  *** Baseline:  Goal status: INITIAL  4.  *** Baseline:  Goal status: INITIAL  5.  *** Baseline:  Goal status:  INITIAL  6.  *** Baseline:  Goal status: INITIAL  LONG TERM GOALS: Target date: 06/22/2024  *** Baseline:  Goal status: INITIAL  2.  *** Baseline:  Goal status: INITIAL  3.  *** Baseline:  Goal status: INITIAL  4.  *** Baseline:  Goal status: INITIAL  5.  *** Baseline:  Goal status: INITIAL  6.  *** Baseline:  Goal status: INITIAL  ASSESSMENT:  CLINICAL IMPRESSION: Patient is a *** y.o. *** who was seen today for ***.   OBJECTIVE IMPAIRMENTS: Objective impairments include {SLPOBJIMP:27107}. These impairments are limiting patient from {SLPLIMIT:27108}.Factors affecting potential to achieve goals and functional outcome are {SLP factors:25450}.. Patient will benefit from skilled SLP services to address above impairments and improve overall function.  REHAB POTENTIAL:  {rehabpotential:25112}  PLAN:  SLP FREQUENCY: {rehab frequency:25116}  SLP DURATION: {rehab duration:25117}  PLANNED INTERVENTIONS: {SLP treatment/interventions:25449}    Harlene LITTIE Ned, CCC-SLP 04/13/2024, 10:17 AM

## 2024-04-13 NOTE — Therapy (Signed)
 OUTPATIENT PHYSICAL THERAPY NEURO EVALUATION   Patient Name: KERT SHACKETT MRN: [id number] DOB:[birth year], 73 y.o., male Today's Date: 04/13/2024   PCP: Loreli Kins, MD REFERRING PROVIDER: Evonnie Asberry RAMAN, DO  END OF SESSION:  PT End of Session - 04/13/24 0848     Visit Number 1    Number of Visits 9   plus eval   Date for PT Re-Evaluation 05/18/24    Authorization Type [employer] Medicare    PT Start Time 0846    PT Stop Time 0920    PT Time Calculation (min) 34 min    Equipment Utilized During Treatment Gait belt    Activity Tolerance Patient tolerated treatment well    Behavior During Therapy [facility] for tasks assessed/performed;Flat affect          Past Medical History:  Diagnosis Date   Arrhythmia    Atrial fibrillation 10/11/2021   Cerebrovascular disease    moderate per 2021 MRI   Cholecystitis 09/01/2021   Chronic kidney disease, stage 3 unspecified 10/11/2021   Dizzy spells 03/08/2020   Erectile dysfunction 10/11/2021   Essential hypertension 06/14/2015   Essential tremor    Glaucoma 08/31/2021   Hyperlipidemia 06/14/2015   Irregular heart beat    Mild cognitive impairment of uncertain or unknown etiology 05/21/2023   Mixed hyperlipidemia    Multiple lacunar infarcts    bilateral centrum semiovale and right parietal cortex.   Obesity 10/11/2021   Obstructive sleep apnea    no CPAP   Other male erectile dysfunction    Primary open angle glaucoma (POAG) of left eye, moderate stage 10/11/2021   Primary open-angle glaucoma, right eye, mild stage 10/11/2021   Renal insufficiency 09/06/2022   Sinus bradycardia 03/08/2020   Tremor 10/11/2021   Type 2 diabetes mellitus with other specified complication (HCC)    Type II diabetes mellitus 10/11/2021   Vascular dementia [facility])    Past Surgical History:  Procedure Laterality Date   CHOLECYSTECTOMY N/A 08/31/2021   Procedure: LAPAROSCOPIC CHOLECYSTECTOMY;  Surgeon: Lyndel Deward PARAS, MD;  Location: [facility] OR;   Service: General;  Laterality: N/A;   EYE SURGERY     Glaucoma surgery   PROSTATE SURGERY     Patient Active Problem List   Diagnosis Date Noted   Essential tremor    Mixed hyperlipidemia    Irregular heart beat    Other male erectile dysfunction    Type 2 diabetes mellitus with other specified complication (HCC)    Vascular dementia (HCC)    Mild cognitive impairment of uncertain or unknown etiology 05/21/2023   Obstructive sleep apnea    Multiple lacunar infarcts    Cerebrovascular disease    Renal insufficiency 09/06/2022   Atrial fibrillation 10/11/2021   Chronic kidney disease, stage 3 unspecified 10/11/2021   Obesity 10/11/2021   Erectile dysfunction 10/11/2021   Primary open angle glaucoma (POAG) of left eye, moderate stage 10/11/2021   Primary open-angle glaucoma, right eye, mild stage 10/11/2021   Tremor 10/11/2021   Type II diabetes mellitus 10/11/2021   Cholecystitis 09/01/2021   Glaucoma 08/31/2021   Arrhythmia    Dizzy spells 03/08/2020   Sinus bradycardia 03/08/2020   Essential hypertension 06/14/2015   Hyperlipidemia 06/14/2015    ONSET DATE: 02/17/2024 (referral)   REFERRING DIAG: G20.A1 (ICD-10-CM) - Parkinson's disease without dyskinesia or fluctuating manifestations (HCC)  THERAPY DIAG:  Unsteadiness on feet  Other abnormalities of gait and mobility  Muscle weakness (generalized)  Abnormal posture  Other lack of coordination  Rationale  for Evaluation and Treatment: Rehabilitation  SUBJECTIVE:                                                                                                                                                                                             SUBJECTIVE STATEMENT: Pt presents alone without AD. Lateral deviations noted while ambulating into clinic. Reports he is consistently taking his Carbidopa -Levidopa now, I learned my lesson. Denies falls or near misses. Works on some of his exercises here and there  and does yard work daily for ~30 minutes. Is sleeping well. Has most difficulty getting out of the bathtub.  Pt accompanied by: self (wife in the car)   PERTINENT HISTORY: PD, A fb, stage 3 CKD, HLD, HTN, multiple lacunar infarcts, obesity, type II diabetes  PAIN:  Are you having pain? Pt has chronic bilateral hip pain that started after he started his heart medicine    PRECAUTIONS: Fall  RED FLAGS: None   WEIGHT BEARING RESTRICTIONS: No  FALLS: Has patient fallen in last 6 months? No  LIVING ENVIRONMENT: Lives with: lives with their spouse Lives in: House/apartment Stairs: Yes: External: 2 in front, 8 in back steps; pt cannot recall if he has rails Has following equipment at home: shower chair and Grab bars  PLOF: Independent  PATIENT GOALS: I need to reengage the physical part of this therapy on a regular basis. I have been kind of lax   OBJECTIVE:  Note: Objective measures were completed at Evaluation unless otherwise noted.  DIAGNOSTIC FINDINGS: None relevant   COGNITION: Overall cognitive status: History of cognitive impairments - at baseline   SENSATION: Pt denies significant numbness/tingling in all extremities   EDEMA: None currently    MUSCLE TONE: RLE: Rigidity   POSTURE: rounded shoulders, forward head, increased thoracic kyphosis, weight shift right, and tremor in RUE   LOWER EXTREMITY ROM:     Active  Right Eval Left Eval  Hip flexion    Hip extension    Hip abduction    Hip adduction    Hip internal rotation    Hip external rotation    Knee flexion    Knee extension    Ankle dorsiflexion    Ankle plantarflexion    Ankle inversion    Ankle eversion     (Blank rows = not tested)  LOWER EXTREMITY MMT:    MMT Right Eval Left Eval  Hip flexion    Hip extension    Hip abduction    Hip adduction    Hip internal rotation    Hip external rotation    Knee flexion    Knee  extension    Ankle dorsiflexion    Ankle plantarflexion     Ankle inversion    Ankle eversion    (Blank rows = not tested)  BED MOBILITY:  Not tested Pt reports he frequently sleeps on the couch. Is slow getting into/out of the bed   TRANSFERS: Sit to stand: Modified independence  Assistive device utilized: None     Stand to sit: Modified independence  Assistive device utilized: None      RAMP:  Not tested  CURB:  Not tested  STAIRS: Not tested GAIT: Gait pattern: step through pattern, decreased arm swing- Right, decreased stride length, decreased hip/knee flexion- Right, decreased hip/knee flexion- Left, shuffling, lateral hip instability, decreased trunk rotation, trunk flexed, poor foot clearance- Right, and poor foot clearance- Left Distance walked: Various clinic distances  Assistive device utilized: None Level of assistance: Modified independence Comments: Lateral deviations noted when performing sit > stand> walk but otherwise no instability noted    FUNCTIONAL TESTS:   [facility] PT Assessment - 04/13/24 0901       Transfers   Five time sit to stand comments  13.96s   no UE support     Ambulation/Gait   Gait velocity 32.8' over 8.18s = 4.01 ft/s no AD      Mini-BESTest   Sit To Stand Normal: Comes to stand without use of hands and stabilizes independently.    Rise to Toes Normal: Stable for 3 s with maximum height.    Stand on one leg (left) Moderate: < 20 s   12.22s   Stand on one leg (right) Moderate: < 20 s   5.18s   Stand on one leg - lowest score 1    Compensatory Stepping Correction - Forward Normal: Recovers independently with a single, large step (second realignement is allowed).   2 steps   Compensatory Stepping Correction - Backward No step, OR would fall if not caught, OR falls spontaneously.   Multiple shuffling steps   Compensatory Stepping Correction - Left Lateral Severe: Falls, or cannot step    Compensatory Stepping Correction - Right Lateral Severe:  Falls, or cannot step    Stepping Corredtion Lateral -  lowest score 0    Stance - Feet together, eyes open, firm surface  Normal: 30s    Stance - Feet together, eyes closed, foam surface  Normal: 30s    Incline - Eyes Closed Normal: Stands independently 30s and aligns with gravity    Change in Gait Speed Moderate: Unable to change walking speed or signs of imbalance   no major change in speed   Walk with head turns - Horizontal Normal: performs head turns with no change in gait speed and good balance    Walk with pivot turns Normal: Turns with feet close FAST (< 3 steps) with good balance.    Step over obstacles Moderate: Steps over box but touches box OR displays cautious behavior by slowing gait.    Timed UP & GO with Dual Task Moderate: Dual Task affects either counting OR walking (>10%) when compared to the TUG without Dual Task.    Mini-BEST total score 20      Timed Up and Go Test   Normal TUG (seconds) 8.37    Cognitive TUG (seconds) 10.87   retro coutning by 3s w/100% accuracy           VITALS  Vitals:   04/13/24 0859  BP: 139/69  Pulse: (!) 52  TREATMENT:   Self-care/home management  Assessed vitals (see above) and WNL. HR low, but pt reports this is normal for him and denied dizziness/lightheadedness  Educated pt on importance of compliance w/medications and exercise. Pt reports he is aware of this now and is taking his medications as prescribed but admits he needs to be better about exercising.   PATIENT EDUCATION: Education details: POC, eval findings, BP and HR parameters  Person educated: Patient Education method: Medical illustrator Education comprehension: verbalized understanding and needs further education  HOME EXERCISE PROGRAM: To be reviewed from previous POC:  [id number], standing/quadruped PWR moves, fwd/retro stepping   GOALS: Goals reviewed with patient? Yes  STG =  LTG DUE TO POC LENGTH    LONG TERM GOALS: Target date: 05/11/2024   Pt will be independent with PD-specific HEP for improved strength, balance, transfers and gait.  Baseline: not performing regularly  Goal status: INITIAL  2.  Pt will score >/= 23/28 on MiniBest for improved stepping strategies and reduced fall risk  Baseline: 20/28 Goal status: INITIAL  3.  Pt will perform 5 sit to stands from 14 height w/no UE support for improved functional BLE strength and transfers  Baseline: reports difficulty standing from the couch  Goal status: INITIAL  4.  Pt will perform floor transfer mod I for improved functional BLE strength and transferring into/out of bathtub.  Baseline:  Goal status: INITIAL   ASSESSMENT:  CLINICAL IMPRESSION: Patient is a 73 year old male referred to Neuro OPPT for PD.   Pt's PMH is significant for: PD, A fb, stage 3 CKD, HLD, HTN, multiple lacunar infarcts, obesity, type II diabetes. The following deficits were present during the exam: decreased safety awareness, impaired cognition, impaired balance, decreased gait kinematics and tremor. Based on MiniBest and PD, pt is an incr risk for falls. Pt would benefit from skilled PT to address these impairments and functional limitations to maximize functional mobility independence.    OBJECTIVE IMPAIRMENTS: Abnormal gait, decreased activity tolerance, decreased balance, decreased cognition, decreased coordination, decreased knowledge of use of DME, difficulty walking, decreased strength, decreased safety awareness, impaired perceived functional ability, improper body mechanics, postural dysfunction, and pain  ACTIVITY LIMITATIONS: carrying, lifting, squatting, stairs, locomotion level, and caring for others  PARTICIPATION LIMITATIONS: driving, shopping, community activity, and yard work  PERSONAL FACTORS: Fitness and 1 comorbidity: PD are also affecting patient's functional outcome.   REHAB POTENTIAL: Good  CLINICAL  DECISION MAKING: Evolving/moderate complexity  EVALUATION COMPLEXITY: Moderate  PLAN:  PT FREQUENCY: 2x/week  PT DURATION: 4 weeks  PLANNED INTERVENTIONS: 97164- PT Re-evaluation, 97750- Physical Performance Testing, 97110-Therapeutic exercises, 97530- Therapeutic activity, [id number]- Neuromuscular re-education, 97535- Self Care, [id number]- Manual therapy, [id number]- Gait training, [phone]- Canalith repositioning, [id number]- Aquatic Therapy, [phone] (1-2 muscles), 20561 (3+ muscles)- Dry Needling, Patient/Family education, Balance training, Stair training, Joint mobilization, Spinal mobilization, Vestibular training, and DME instructions  PLAN FOR NEXT SESSION: Review HEP from previous POC and update accordingly. Work on stepping strategies, lateral weight shifting, sitting from low surfaces    Mat Stuard E Michial Disney, PT 04/13/2024, 9:34 AM

## 2024-04-15 NOTE — Therapy (Signed)
 OUTPATIENT SPEECH LANGUAGE PATHOLOGY [facility] EVALUATION   Patient Name: Joseph Lamb MRN: [id number] DOB:[birth year], 73 y.o., male Today's Date: 04/15/2024  PCP: Dr. Joen Gentry REFERRING PROVIDER: Dr. Asberry Tat  END OF SESSION:    Past Medical History:  Diagnosis Date   Arrhythmia    Atrial fibrillation 10/11/2021   Cerebrovascular disease    moderate per 2021 MRI   Cholecystitis 09/01/2021   Chronic kidney disease, stage 3 unspecified 10/11/2021   Dizzy spells 03/08/2020   Erectile dysfunction 10/11/2021   Essential hypertension 06/14/2015   Essential tremor    Glaucoma 08/31/2021   Hyperlipidemia 06/14/2015   Irregular heart beat    Mild cognitive impairment of uncertain or unknown etiology 05/21/2023   Mixed hyperlipidemia    Multiple lacunar infarcts    bilateral centrum semiovale and right parietal cortex.   Obesity 10/11/2021   Obstructive sleep apnea    no CPAP   Other male erectile dysfunction    Primary open angle glaucoma (POAG) of left eye, moderate stage 10/11/2021   Primary open-angle glaucoma, right eye, mild stage 10/11/2021   Renal insufficiency 09/06/2022   Sinus bradycardia 03/08/2020   Tremor 10/11/2021   Type 2 diabetes mellitus with other specified complication (HCC)    Type II diabetes mellitus 10/11/2021   Vascular dementia [facility])    Past Surgical History:  Procedure Laterality Date   CHOLECYSTECTOMY N/A 08/31/2021   Procedure: LAPAROSCOPIC CHOLECYSTECTOMY;  Surgeon: Lyndel Deward PARAS, MD;  Location: [facility] OR;  Service: General;  Laterality: N/A;   EYE SURGERY     Glaucoma surgery   PROSTATE SURGERY     Patient Active Problem List   Diagnosis Date Noted   Essential tremor    Mixed hyperlipidemia    Irregular heart beat    Other male erectile dysfunction    Type 2 diabetes mellitus with other specified complication (HCC)    Vascular dementia (HCC)    Mild cognitive impairment of uncertain or unknown etiology 05/21/2023    Obstructive sleep apnea    Multiple lacunar infarcts    Cerebrovascular disease    Renal insufficiency 09/06/2022   Atrial fibrillation 10/11/2021   Chronic kidney disease, stage 3 unspecified 10/11/2021   Obesity 10/11/2021   Erectile dysfunction 10/11/2021   Primary open angle glaucoma (POAG) of left eye, moderate stage 10/11/2021   Primary open-angle glaucoma, right eye, mild stage 10/11/2021   Tremor 10/11/2021   Type II diabetes mellitus 10/11/2021   Cholecystitis 09/01/2021   Glaucoma 08/31/2021   Arrhythmia    Dizzy spells 03/08/2020   Sinus bradycardia 03/08/2020   Essential hypertension 06/14/2015   Hyperlipidemia 06/14/2015    ONSET DATE: 02/17/24 referral date   REFERRING DIAG: G20.A1 (ICD-10-CM) - Parkinson's disease without dyskinesia or fluctuating manifestations (HCC)   THERAPY DIAG:  Dysarthria and anarthria  Rationale for Evaluation and Treatment: Rehabilitation  SUBJECTIVE:   SUBJECTIVE STATEMENT: Pt reports has not kept up with HEP since last ST course.  Pt accompanied by: self  PERTINENT HISTORY: recently dx with PD with rigidity, tremor, memory change, sialorrhea, mood change.   PAIN:  Are you having pain? Mild discomfort endorsed, denied pain  FALLS: Has patient fallen in last 6 months?  See PT evaluation for details  LIVING ENVIRONMENT: Lives with: lives with their spouse Lives in: House/apartment  PLOF:  Level of assistance: Needed assistance with IADLS Employment: Retired  PATIENT GOALS: improved communication  OBJECTIVE:  Note: Objective measures were completed at Evaluation unless otherwise noted.  COGNITION: Overall  cognitive status: Impaired Areas of impairment: Memory Comments: Mild cognitive impairment per neuropsych assessment. Reports challenges in completed to-do list items, forgetting evening meds, recall of information from conversation with spouse   ORAL MOTOR EXAMINATION: Overall status: WFL  MOTOR SPEECH  EVALUATON assessed across variety of speech tasks: reading, word repetition, generative discourse sample Overall motor speech: impaired Level of impairment: Word Rate of Speech: Reduced Dysfluencies: none evidenced Phonation: low vocal intensity Conversational loudness average: 65 dB Voice Quality: normal Respiration: thoracic breathing Word and Phrasal Stress: reduced use of stress Resonance: WFL Articulation: Appears intact Diadochokinetic Rate (DDK): poorly sequenced Intelligibility: Intelligible Motor planning: Appears intact Effective technique: increased vocal intensity and intentional speech  Stimulability trials: Given SLP modeling and occasional min cues, loudness average increased to 72 dB at the reading (paragraph) level.  Comments: Pt with teach back of intentional speech at onset of session.   Completed audio recording of patients baseline voice without cueing from SLP: No, pt did not have phone with him  DYSPHAGIA EVALUATION SUBJECTIVE DYSPHAGIA REPORTS: Reported symptoms: coughing with both solids and liquids and globus sensation  Current diet: regular and thin liquids  Co-morbid voice changes: No  Motor speech: dysarthria in setting of PD  FACTORS WHICH MAY INCREASE RISK OF ADVERSE EVENT IN PRESENCE OF ASPIRATION:  General health: well appearing  Risk factors: none evident     CLINICAL SWALLOW ASSESSMENT:   Dentition: adequate natural dentition Vocal quality at baseline: low vocal intensity Patient directly observed with POs: Yes: thin liquids  Feeding: able to feed self Liquids provided by: cup [facility] Swallow Protocol: Fail: unable to drink 3oz. No overt s/sx aspiration.  Oral phase signs and symptoms: none Pharyngeal phase signs and symptoms: none                                                                                                                             TREATMENT DATE:  04/13/24:Target improving vocal quality and increasing intensity through  progressively difficulty speech tasks using Speak Out! program, lesson 1. ST leads pt through exercises providing usual model prior to pt execution. rare min-A required to achieve target dB this date. Averages this date:  Sustained ah 88-92 dB Counting 89 dB Reading 85 dB Cognitive speech task 83 dB  Conversational sample of approx 5 minutes, pt averages 69 dB with occasional mod-A.   PATIENT EDUCATION: Education details: POC, goals, HEP Person educated: Patient Education method: Medical illustrator Education comprehension: verbalized understanding, returned demonstration, and needs further education  HOME EXERCISE PROGRAM: Speak Out BID   GOALS: Goals reviewed with patient? Yes  SHORT TERM GOALS: Target date: 05/18/2024  Pt will meet or exceed target dB for all Speak Out! therapy tasks with occasional min-A over 2 sessions  Baseline: Goal status: INITIAL  2.  Pt will maintain 70+ dB in 5 minute conversation over 2 sessions  Baseline:  Goal status: INITIAL  3.  Pt will evidence understanding of swallow  compensations/strategies via teach back with mod-I Baseline:  Goal status: INITIAL  4.  Pt will employ strategies to ensure attention to conversation with reported benefit over 1 week period Baseline:  Goal status: INITIAL  5.  Pt will be complaint with prescribed HEP over 1 week period Baseline:  Goal status: INITIAL   LONG TERM GOALS: Target date: 06/22/2024  Pt will maintain 70+ dB over 15 minute conversation with rare min-A over 2 sessions Baseline:  Goal status: INITIAL  2.  Pt will verbalize plan for HEP for ongoing practice following ST d/c  Baseline:  Goal status: INITIAL  3.  Pt will utilize external aid resulting in no missed evening med doses over 2 week period  Baseline:  Goal status: INITIAL  4.  Pt will complete daily to-do list tasks with use of strategies/compensations over 1 week period  Baseline:  Goal status:  INITIAL  ASSESSMENT:  CLINICAL IMPRESSION: Patient is a 73 y.o. M who was seen today for ST evaluation in setting of PD. Pt presents with mild dysarthria c/b low vocal intensity and volume decay. Pt known to clinic from prior ST course, familiar with Speak Out! program. Additionally presents with complaints of mild cognitive changes, primarily with regards to memory. Pt presents with mild oropharyngeal dysphagia c/b globus sensation and occasional coughing with liquids and solids. Suspect is 2/2 PD, will plan to teach strategies and compensations, defer MBS for this time. Stable pulmonary status, able to eat regular and thin diet.   OBJECTIVE IMPAIRMENTS: Objective impairments include memory, dysarthria, and dysphagia. These impairments are limiting patient from household responsibilities, effectively communicating at home and in community, and safety when swallowing.Factors affecting potential to achieve goals and functional outcome are co-morbidities. Patient will benefit from skilled SLP services to address above impairments and improve overall function.  REHAB POTENTIAL: Good  PLAN:  SLP FREQUENCY: 2x/week  SLP DURATION: 10 weeks  PLANNED INTERVENTIONS: Aspiration precaution training, Pharyngeal strengthening exercises, Cueing hierachy, Cognitive reorganization, Internal/external aids, Functional tasks, SLP instruction and feedback, Compensatory strategies, and Patient/family education    Harlene LITTIE Ned, CCC-SLP 04/15/2024, 9:04 AM

## 2024-04-28 ENCOUNTER — Ambulatory Visit: Admitting: Occupational Therapy

## 2024-04-28 ENCOUNTER — Encounter: Payer: Self-pay | Admitting: Physical Therapy

## 2024-04-28 ENCOUNTER — Ambulatory Visit: Admitting: Physical Therapy

## 2024-04-28 VITALS — BP 129/69 | HR 57

## 2024-04-28 DIAGNOSIS — R2689 Other abnormalities of gait and mobility: Secondary | ICD-10-CM

## 2024-04-28 DIAGNOSIS — R2681 Unsteadiness on feet: Secondary | ICD-10-CM

## 2024-04-28 DIAGNOSIS — R293 Abnormal posture: Secondary | ICD-10-CM

## 2024-04-28 DIAGNOSIS — R29898 Other symptoms and signs involving the musculoskeletal system: Secondary | ICD-10-CM

## 2024-04-28 DIAGNOSIS — R29818 Other symptoms and signs involving the nervous system: Secondary | ICD-10-CM

## 2024-04-28 DIAGNOSIS — M6281 Muscle weakness (generalized): Secondary | ICD-10-CM

## 2024-04-28 DIAGNOSIS — R278 Other lack of coordination: Secondary | ICD-10-CM

## 2024-04-28 DIAGNOSIS — R471 Dysarthria and anarthria: Secondary | ICD-10-CM | POA: Diagnosis not present

## 2024-04-28 NOTE — Therapy (Signed)
 OUTPATIENT OCCUPATIONAL THERAPY [facility]'S TREATMENT  Patient Name: Joseph Lamb MRN: [id number] DOB:1951/01/19, 73 y.o., male Today's Date: 04/28/2024  PCP: Loreli Kins, MD REFERRING PROVIDER: Evonnie Asberry RAMAN, DO  END OF SESSION:  OT End of Session - 04/28/24 1105     Visit Number 2    Number of Visits 17    Date for OT Re-Evaluation 06/14/24    Authorization Type [employer] MCR - Auth required    OT Start Time 1103    OT Stop Time 1145    OT Time Calculation (min) 42 min    Activity Tolerance Patient tolerated treatment well    Behavior During Therapy [facility] for tasks assessed/performed          Past Medical History:  Diagnosis Date   Arrhythmia    Atrial fibrillation 10/11/2021   Cerebrovascular disease    moderate per 2021 MRI   Cholecystitis 09/01/2021   Chronic kidney disease, stage 3 unspecified 10/11/2021   Dizzy spells 03/08/2020   Erectile dysfunction 10/11/2021   Essential hypertension 06/14/2015   Essential tremor    Glaucoma 08/31/2021   Hyperlipidemia 06/14/2015   Irregular heart beat    Mild cognitive impairment of uncertain or unknown etiology 05/21/2023   Mixed hyperlipidemia    Multiple lacunar infarcts    bilateral centrum semiovale and right parietal cortex.   Obesity 10/11/2021   Obstructive sleep apnea    no CPAP   Other male erectile dysfunction    Primary open angle glaucoma (POAG) of left eye, moderate stage 10/11/2021   Primary open-angle glaucoma, right eye, mild stage 10/11/2021   Renal insufficiency 09/06/2022   Sinus bradycardia 03/08/2020   Tremor 10/11/2021   Type 2 diabetes mellitus with other specified complication (HCC)    Type II diabetes mellitus 10/11/2021   Vascular dementia [facility])    Past Surgical History:  Procedure Laterality Date   CHOLECYSTECTOMY N/A 08/31/2021   Procedure: LAPAROSCOPIC CHOLECYSTECTOMY;  Surgeon: Lyndel Deward PARAS, MD;  Location: [facility] OR;  Service: General;  Laterality: N/A;   EYE SURGERY      Glaucoma surgery   PROSTATE SURGERY     Patient Active Problem List   Diagnosis Date Noted   Essential tremor    Mixed hyperlipidemia    Irregular heart beat    Other male erectile dysfunction    Type 2 diabetes mellitus with other specified complication (HCC)    Vascular dementia (HCC)    Mild cognitive impairment of uncertain or unknown etiology 05/21/2023   Obstructive sleep apnea    Multiple lacunar infarcts    Cerebrovascular disease    Renal insufficiency 09/06/2022   Atrial fibrillation 10/11/2021   Chronic kidney disease, stage 3 unspecified 10/11/2021   Obesity 10/11/2021   Erectile dysfunction 10/11/2021   Primary open angle glaucoma (POAG) of left eye, moderate stage 10/11/2021   Primary open-angle glaucoma, right eye, mild stage 10/11/2021   Tremor 10/11/2021   Type II diabetes mellitus 10/11/2021   Cholecystitis 09/01/2021   Glaucoma 08/31/2021   Arrhythmia    Dizzy spells 03/08/2020   Sinus bradycardia 03/08/2020   Essential hypertension 06/14/2015   Hyperlipidemia 06/14/2015    ONSET DATE: 02/17/2024 (referral date)   REFERRING DIAG: G20.A1 (ICD-10-CM) - Parkinson's disease without dyskinesia or fluctuating manifestations (HCC)  THERAPY DIAG:  Other lack of coordination  Other symptoms and signs involving the musculoskeletal system  Other symptoms and signs involving the nervous system  Muscle weakness (generalized)  Unsteadiness on feet  Rationale for Evaluation and  Treatment: Rehabilitation  SUBJECTIVE:   SUBJECTIVE STATEMENT: No pain today Pt accompanied by: self  PERTINENT HISTORY: PMH: PD (diagnosed 2024), A fb, stage 3 CKD, HLD, HTN, multiple lacunar infarcts, obesity, type II diabetes   PRECAUTIONS: Fall  WEIGHT BEARING RESTRICTIONS: No  PAIN:  Are you having pain? No except sometimes low back pain  FALLS: Has patient fallen in last 6 months? No  LIVING ENVIRONMENT: Lives with: lives with their spouse and lives with their  son Lives in: 1 story home Stairs: Yes: External: 3 steps; bilateral but cannot reach both Has following equipment at home: shower chair and Grab bars  PLOF: Independent with basic ADLs  PATIENT GOALS: just go with the flow  OBJECTIVE:  Note: Objective measures were completed at Evaluation unless otherwise noted.  HAND DOMINANCE: Right  ADLs: Eating: mod I  - pt reports occasional tremors Grooming: mod I  UB Dressing: wife does buttons on dress shirt LB Dressing: mod I - slower  Toileting: mod I  Bathing: mod I (getting down in tub for bathing) Tub Shower transfers: mod I  Equipment: Grab bars  IADLs: Shopping: wife mostly does, but pt will sometimes accompany her. Pt will occasionally go to get smaller purchases Light housekeeping: pt does some laundry and washing dishes, sweeping Meal Prep: wife mostly does (pt every once in awhile does breakfast)  Community mobility: pt drives short distances (avoids highway), wife drives mostly Medication management: pt doing Landscape architect: wife does Handwriting: 100% legible and Mild micrographia w/ decline in legibility if writing longer  MOBILITY STATUS: Independent   FUNCTIONAL OUTCOME MEASURES: Fastening/unfastening 3 buttons: 2 min. 21 sec (w/o glasses and reports difficulty seeing buttons) Physical performance test: PPT#2 (simulated eating) 12.95 sec (holding spoon at end)  & PPT#4 (donning/doffing jacket): 20.22 sec (w/ hospital gown)   04/28/24: fastening/unfastening 3 buttons: 54 sec w/ glasses on  COORDINATION: 9 Hole Peg test: Right: 38.28 sec; Left: 47.56 sec Box and Blocks:  Right 47 blocks, Left 45 blocks Tremors: Resting and Right  UE ROM:  WFL   SENSATION: Pt reports diminished RUE   COGNITION: Overall cognitive status: Impaired and decreased processing, however pt more alert and participatory for today's evaluation  OBSERVATIONS: Bradykinesia, Hypokinesia, and Rt resting tremors, decreased vision  d/t glaucoma                                                                                                                    TREATMENT DATE: 04/28/24  Reassessed buttons with glasses - much improved since evaluation. See above    Pt issued updated education on ways to prevent future PD related complications and reviewed extensively - see pt instructions for details.   Reviewed PWR! Hands HEP - pt demo each with mod cues needed Reviewed coordination HEP w/ minor adjustments required - unable to review coin ex d/t time constraints - to finish next session   PATIENT EDUCATION: Education details: see above Person educated: Patient Education method: Explanation Education comprehension: verbalized understanding  HOME EXERCISE PROGRAM: N/A  GOALS: Goals reviewed with patient? Yes  SHORT TERM GOALS: Target date: 05/14/24  Independent with PD specific HEP  (review and update prn)  Baseline: Goal status: INITIAL  2.  Pt will verbalize understanding of adaptive strategies to increase ease with ADLS/IADLS   Baseline:  Goal status: INITIAL  3.  Pt will demonstrate increased ease with dressing as evidenced by decreasing PPT#4 (don/ doff jacket) to 15 secs or less  Baseline: 20.22 sec w/ hospital gown Goal status: INITIAL  4.  Pt will demonstrate improved ease with fastening buttons as evidenced by decreasing 3 button/unbutton time by 30 seconds or more wearing his glasses Baseline: 2 min and 21 sec (w/o glasses), 1:43 at screen 02/17/24 04/28/24: 54 sec w/ glasses Goal status: MET, and revised to performing in less than 50 sec.   5.  Pt will write a paragraph with no significant decrease in size and maintain 100% legibility  Baseline:  Goal status: INITIAL   LONG TERM GOALS: Target date: 06/14/24  Pt will verbalize understanding of ways to prevent future PD related complications and appropriate community resources prn  Baseline:  Goal status: INITIAL  2.  Pt will verbalize  understanding of ways to keep thinking skills sharp and ways to compensate for STM changes  Baseline:  Goal status: INITIAL  3.  Pt will demonstrate improved fine motor coordination for ADLs as evidenced by decreasing 9 hole peg test score for Rt hand by 4 secs  Baseline: 38.28 sec Goal status: INITIAL  4.  Pt will demonstrate improved fine motor coordination for ADLs as evidenced by decreasing 9 hole peg test score for Lt hand by 7 secs or more Baseline: 47.56 sec Goal status: INITIAL  5.  Pt will demonstrate improved ease with fastening buttons as evidenced by decreasing 3 button/unbutton time by 40 seconds with glasses Baseline: 2 min, 21 sec  Goal status: INITIAL  ASSESSMENT:  CLINICAL IMPRESSION: Patient seen today for occupational therapy treatment for Parkinson's disease. Hx includes PD, A fb, stage 3 CKD, HLD, HTN, multiple lacunar infarcts, obesity, type II diabetes . Patient currently presents below baseline level of functioning demonstrating decline in coordination, resting tremor, and overall slower with ADLS including buttons. Pt would benefit from skilled OT services in the outpatient setting to work on impairments as noted to help pt improve efficiency with ADLS, review PD specific HEP, and prevent future PD related complications.  PERFORMANCE DEFICITS: in functional skills including ADLs, IADLs, coordination, dexterity, sensation, tone, ROM, strength, Fine motor control, Gross motor control, mobility, balance, body mechanics, and UE functional use, cognitive skills including problem solving and safety awareness, and psychosocial skills including coping strategies.   IMPAIRMENTS: are limiting patient from ADLs, IADLs, rest and sleep, leisure, and social participation.   COMORBIDITIES:  may have co-morbidities  that affects occupational performance. Patient will benefit from skilled OT to address above impairments and improve overall function.  MODIFICATION OR ASSISTANCE TO  COMPLETE EVALUATION: No modification of tasks or assist necessary to complete an evaluation.  OT OCCUPATIONAL PROFILE AND HISTORY: Detailed assessment: Review of records and additional review of physical, cognitive, psychosocial history related to current functional performance.  CLINICAL DECISION MAKING: Moderate - several treatment options, min-mod task modification necessary  REHAB POTENTIAL: Good  EVALUATION COMPLEXITY: Moderate    PLAN:  OT FREQUENCY: 2x/week  OT DURATION: 8 weeks  PLANNED INTERVENTIONS: 97535 self care/ADL training, 02889 therapeutic exercise, 97530 therapeutic activity, 97112 neuromuscular re-education, 97140 manual therapy, [id number]  aquatic therapy, [id number] fluidotherapy, [id number] Orthotic Initial, [id number] Orthotic/Prosthetic subsequent, passive range of motion, functional mobility training, visual/perceptual remediation/compensation, coping strategies training, patient/family education, and DME and/or AE instructions  RECOMMENDED OTHER SERVICES: none at this time  CONSULTED AND AGREED WITH PLAN OF CARE: Patient  PLAN FOR NEXT SESSION: review coin ex from coordination HEP and if necessary - print updated coordination HEP. Review PWR! Supine and PWR! All 4'S   Burnard JINNY Roads, OT 04/28/2024, 11:06 AM

## 2024-04-28 NOTE — Patient Instructions (Signed)
 Ways to prevent future Parkinson's related complications:  1.   Exercise regularly/daily. Challenge yourself SAFELY and try to get different types of exercise including: PWR! Moves, Cardio (cycling), strength and balance training, and stretches (Yoga)   2.   Focus on BIGGER movements during daily activities- really reach overhead, straighten elbows and extend fingers  3.   When dressing (especially jacket/coat) or reaching for your seatbelt make sure to use your body to assist by twisting while you reach and looking at where you are reaching - this can help to minimize stress on the shoulder and reduce the risk of a rotator cuff tear  4.   Swing your arms when you walk (unless using walker)! People with PD are at increased risk for frozen shoulder and swinging your arms can reduce this risk.  5. Drink plenty of water and eat a high fiber diet (fresh fruits and veggies, nuts/seeds, beans, some fish). Avoid canned foods, reduce sugar intake and dairy when possible. Mediterranean diet is best  6. Do NOT take your Parkinson's medication around meals (take at least 30 min before a meal, or 1 hour after a meal), especially protein as it blocks the absorption of the medicine and will not work effectively  7. Keep your feet apart when you are standing (wider stance) to allow you to have better balance and to reach further with your arms. Also make sure your feet are apart before standing up.   8. Stay social! - Go out with friends, play card games with others, join a community group or community PD exercise class  9. Get a good night's sleep! Staying active during the day and developing a good bedtime routine can help with this.   10. Communicate with your neurologist any concerns and maintain overall health - keep blood pressure regulated, reduce stress/anxiety, good nutrition/gut health, etc.

## 2024-04-28 NOTE — Therapy (Signed)
 OUTPATIENT PHYSICAL THERAPY NEURO TREATMENT   Patient Name: DHANVIN SZETO MRN: [id number] DOB:October 22, 1950, 73 y.o., male Today's Date: 04/28/2024   PCP: Loreli Kins, MD REFERRING PROVIDER: Evonnie Asberry RAMAN, DO  END OF SESSION:  PT End of Session - 04/28/24 1018     Visit Number 2    Number of Visits 9   plus eval   Date for PT Re-Evaluation 05/18/24    Authorization Type [employer] Medicare    PT Start Time 1017    PT Stop Time 1057    PT Time Calculation (min) 40 min    Equipment Utilized During Treatment --    Activity Tolerance Patient tolerated treatment well    Behavior During Therapy [facility] for tasks assessed/performed;Flat affect          Past Medical History:  Diagnosis Date   Arrhythmia    Atrial fibrillation 10/11/2021   Cerebrovascular disease    moderate per 2021 MRI   Cholecystitis 09/01/2021   Chronic kidney disease, stage 3 unspecified 10/11/2021   Dizzy spells 03/08/2020   Erectile dysfunction 10/11/2021   Essential hypertension 06/14/2015   Essential tremor    Glaucoma 08/31/2021   Hyperlipidemia 06/14/2015   Irregular heart beat    Mild cognitive impairment of uncertain or unknown etiology 05/21/2023   Mixed hyperlipidemia    Multiple lacunar infarcts    bilateral centrum semiovale and right parietal cortex.   Obesity 10/11/2021   Obstructive sleep apnea    no CPAP   Other male erectile dysfunction    Primary open angle glaucoma (POAG) of left eye, moderate stage 10/11/2021   Primary open-angle glaucoma, right eye, mild stage 10/11/2021   Renal insufficiency 09/06/2022   Sinus bradycardia 03/08/2020   Tremor 10/11/2021   Type 2 diabetes mellitus with other specified complication (HCC)    Type II diabetes mellitus 10/11/2021   Vascular dementia [facility])    Past Surgical History:  Procedure Laterality Date   CHOLECYSTECTOMY N/A 08/31/2021   Procedure: LAPAROSCOPIC CHOLECYSTECTOMY;  Surgeon: Lyndel Deward PARAS, MD;  Location: [facility] OR;  Service:  General;  Laterality: N/A;   EYE SURGERY     Glaucoma surgery   PROSTATE SURGERY     Patient Active Problem List   Diagnosis Date Noted   Essential tremor    Mixed hyperlipidemia    Irregular heart beat    Other male erectile dysfunction    Type 2 diabetes mellitus with other specified complication (HCC)    Vascular dementia (HCC)    Mild cognitive impairment of uncertain or unknown etiology 05/21/2023   Obstructive sleep apnea    Multiple lacunar infarcts    Cerebrovascular disease    Renal insufficiency 09/06/2022   Atrial fibrillation 10/11/2021   Chronic kidney disease, stage 3 unspecified 10/11/2021   Obesity 10/11/2021   Erectile dysfunction 10/11/2021   Primary open angle glaucoma (POAG) of left eye, moderate stage 10/11/2021   Primary open-angle glaucoma, right eye, mild stage 10/11/2021   Tremor 10/11/2021   Type II diabetes mellitus 10/11/2021   Cholecystitis 09/01/2021   Glaucoma 08/31/2021   Arrhythmia    Dizzy spells 03/08/2020   Sinus bradycardia 03/08/2020   Essential hypertension 06/14/2015   Hyperlipidemia 06/14/2015    ONSET DATE: 02/17/2024 (referral)   REFERRING DIAG: G20.A1 (ICD-10-CM) - Parkinson's disease without dyskinesia or fluctuating manifestations (HCC)  THERAPY DIAG:  Other abnormalities of gait and mobility  Unsteadiness on feet  Muscle weakness (generalized)  Abnormal posture  Rationale for Evaluation and Treatment: Rehabilitation  SUBJECTIVE:                                                                                                                                                                                             SUBJECTIVE STATEMENT: Feels like he needs to work on everything. Taking his Carbidopa -Levodopa  on a regular basis. No falls, but had some near falls and was able to catch himself. Doing the standing PWR moves. Brought in his exercise binder. Has some interest in the exercise classes, but haven't brought  himself to go to a class yet.    Pt accompanied by: self (wife in the car)   PERTINENT HISTORY: PD, A fb, stage 3 CKD, HLD, HTN, multiple lacunar infarcts, obesity, type II diabetes  PAIN:  Are you having pain? Pt has chronic bilateral hip pain that started after he started his heart medicine    PRECAUTIONS: Fall  RED FLAGS: None   WEIGHT BEARING RESTRICTIONS: No  FALLS: Has patient fallen in last 6 months? No  LIVING ENVIRONMENT: Lives with: lives with their spouse Lives in: House/apartment Stairs: Yes: External: 2 in front, 8 in back steps; pt cannot recall if he has rails Has following equipment at home: shower chair and Grab bars  PLOF: Independent  PATIENT GOALS: I need to reengage the physical part of this therapy on a regular basis. I have been kind of lax   OBJECTIVE:  Note: Objective measures were completed at Evaluation unless otherwise noted.  DIAGNOSTIC FINDINGS: None relevant   COGNITION: Overall cognitive status: History of cognitive impairments - at baseline   SENSATION: Pt denies significant numbness/tingling in all extremities   EDEMA: None currently    MUSCLE TONE: RLE: Rigidity   POSTURE: rounded shoulders, forward head, increased thoracic kyphosis, weight shift right, and tremor in RUE   LOWER EXTREMITY ROM:     Active  Right Eval Left Eval  Hip flexion    Hip extension    Hip abduction    Hip adduction    Hip internal rotation    Hip external rotation    Knee flexion    Knee extension    Ankle dorsiflexion    Ankle plantarflexion    Ankle inversion    Ankle eversion     (Blank rows = not tested)  LOWER EXTREMITY MMT:    MMT Right Eval Left Eval  Hip flexion    Hip extension    Hip abduction    Hip adduction    Hip internal rotation    Hip external rotation    Knee flexion    Knee extension    Ankle dorsiflexion  Ankle plantarflexion    Ankle inversion    Ankle eversion    (Blank rows = not tested)  BED  MOBILITY:  Not tested Pt reports he frequently sleeps on the couch. Is slow getting into/out of the bed   TRANSFERS: Sit to stand: Modified independence  Assistive device utilized: None     Stand to sit: Modified independence  Assistive device utilized: None      RAMP:  Not tested  CURB:  Not tested  STAIRS: Not tested GAIT: Gait pattern: step through pattern, decreased arm swing- Right, decreased stride length, decreased hip/knee flexion- Right, decreased hip/knee flexion- Left, shuffling, lateral hip instability, decreased trunk rotation, trunk flexed, poor foot clearance- Right, and poor foot clearance- Left Distance walked: Various clinic distances  Assistive device utilized: None Level of assistance: Modified independence Comments: Lateral deviations noted when performing sit > stand> walk but otherwise no instability noted    FUNCTIONAL TESTS:       VITALS  Vitals:   04/28/24 1024  BP: 129/69  Pulse: (!) 57                                                                                                                                  TREATMENT:   Therapeutic Activity:  Assessed vitals (see above) and WNL  SciFit with BUE/BLE Multi-peaks at gear 4.0 for 8 minutes for neural priming, reciprocal movement patterns, incr amplitude of stepping, activity tolerance. Pt reporting RPE as a 6-7/10. With discussion with pt, he reports that he does have a [employer] membership that he could use a bike at the [facility] for aerobic activity. Encouraged pt to do this, esp after D/C from therapy for continued aerobic exercise   Reviewed HEP from previous bout of therapy:   Pt performs PWR! Moves in standing position x 20 reps   PWR! Up for improved posture  PWR! Rock for improved weighshifting  PWR! Twist for improved trunk rotation   PWR! Step for improved step initiation   Pt reporting RPE as an 8-9/10    Forward/retro stepping 10 reps each leg, with cues for  larger amplitude stepping when going backwards Staggered stance weight shifting with UE reaching 10 reps each side, cues for additional elbow extension and opening up hands   PATIENT EDUCATION: Education details: reviewed prior HEP  Person educated: Patient Education method: Medical illustrator Education comprehension: verbalized understanding and needs further education  HOME EXERCISE PROGRAM: To be reviewed from previous POC:  [id number], standing/quadruped PWR moves, fwd/retro stepping  (Reviewed all except quadruped PWR moves on 7/23)  GOALS: Goals reviewed with patient? Yes  STG = LTG DUE TO POC LENGTH    LONG TERM GOALS: Target date: 05/11/2024   Pt will be independent with PD-specific HEP for improved strength, balance, transfers and gait.  Baseline: not performing regularly  Goal status: INITIAL  2.  Pt will score >/= 23/28 on MiniBest for improved  stepping strategies and reduced fall risk  Baseline: 20/28 Goal status: INITIAL  3.  Pt will perform 5 sit to stands from 14 height w/no UE support for improved functional BLE strength and transfers  Baseline: reports difficulty standing from the couch  Goal status: INITIAL  4.  Pt will perform floor transfer mod I for improved functional BLE strength and transferring into/out of bathtub.  Baseline:  Goal status: INITIAL   ASSESSMENT:  CLINICAL IMPRESSION: Today's skilled session focused on larger amplitude movements on SciFit at start of session with pt reporting RPE as 7/10. Pt reports that with his insurance, he has silver sneakers. Educated on benefits of aerobic exercise with PD and encouraged pt to continue with a seated bike after therapy. Remainder of session focused on reviewing HEP. Pt had been performing standing PWR moves, but did need further review of forward/backwards stepping and weight shifting. Pt with no further questions and reporting feeling good with exercises. Will continue per POC.     OBJECTIVE IMPAIRMENTS: Abnormal gait, decreased activity tolerance, decreased balance, decreased cognition, decreased coordination, decreased knowledge of use of DME, difficulty walking, decreased strength, decreased safety awareness, impaired perceived functional ability, improper body mechanics, postural dysfunction, and pain  ACTIVITY LIMITATIONS: carrying, lifting, squatting, stairs, locomotion level, and caring for others  PARTICIPATION LIMITATIONS: driving, shopping, community activity, and yard work  PERSONAL FACTORS: Fitness and 1 comorbidity: PD are also affecting patient's functional outcome.   REHAB POTENTIAL: Good  CLINICAL DECISION MAKING: Evolving/moderate complexity  EVALUATION COMPLEXITY: Moderate  PLAN:  PT FREQUENCY: 2x/week  PT DURATION: 4 weeks  PLANNED INTERVENTIONS: 97164- PT Re-evaluation, 97750- Physical Performance Testing, 97110-Therapeutic exercises, 97530- Therapeutic activity, 97112- Neuromuscular re-education, 97535- Self Care, 02859- Manual therapy, [phone]- Gait training, [phone]- Canalith repositioning, [id number]- Aquatic Therapy, [phone] (1-2 muscles), 20561 (3+ muscles)- Dry Needling, Patient/Family education, Balance training, Stair training, Joint mobilization, Spinal mobilization, Vestibular training, and DME instructions  PLAN FOR NEXT SESSION: Work on stepping strategies, lateral weight shifting, sitting from low surfaces    Sheffield LOISE Senate, PT, DPT 04/28/2024, 10:59 AM

## 2024-05-03 ENCOUNTER — Ambulatory Visit: Admitting: Speech Pathology

## 2024-05-03 ENCOUNTER — Ambulatory Visit: Admitting: Physical Therapy

## 2024-05-06 ENCOUNTER — Encounter: Payer: Self-pay | Admitting: Speech Pathology

## 2024-05-06 ENCOUNTER — Ambulatory Visit: Admitting: Speech Pathology

## 2024-05-06 ENCOUNTER — Ambulatory Visit: Admitting: Occupational Therapy

## 2024-05-06 DIAGNOSIS — R251 Tremor, unspecified: Secondary | ICD-10-CM

## 2024-05-06 DIAGNOSIS — M6281 Muscle weakness (generalized): Secondary | ICD-10-CM

## 2024-05-06 DIAGNOSIS — R278 Other lack of coordination: Secondary | ICD-10-CM

## 2024-05-06 DIAGNOSIS — R471 Dysarthria and anarthria: Secondary | ICD-10-CM

## 2024-05-06 DIAGNOSIS — R29818 Other symptoms and signs involving the nervous system: Secondary | ICD-10-CM

## 2024-05-06 DIAGNOSIS — R29898 Other symptoms and signs involving the musculoskeletal system: Secondary | ICD-10-CM

## 2024-05-06 NOTE — Patient Instructions (Signed)
   Practice your name - spelling it - pause in between each letter  [email or web address]  1950-12-13  [address] [place] [address]  [id number]  I have Parkinson's Disease and have trouble speaking. Please be patient with me  Let others know - I have Parkinson's and it affects my facial expression  I need to make an appointment to the doctor  When can we get together?  I am free next Tuesday at 12:30  Practice your orders  Give me a minute please  I would like Shrimp Scampi, salad, and ice tea with lemon please  I would like a fish sandwich, regular fry, ketchup, and medium tea please.

## 2024-05-06 NOTE — Therapy (Signed)
 OUTPATIENT OCCUPATIONAL THERAPY PARKINSON'S TREATMENT  Patient Name: ELIAZER HEMPHILL MRN: [id number] DOB:08/12/1951, 73 y.o., male Today's Date: 05/06/2024  PCP: Loreli Kins, MD REFERRING PROVIDER: Evonnie Asberry RAMAN, DO  END OF SESSION:  OT End of Session - 05/06/24 1019     Visit Number 3    Number of Visits 17    Date for OT Re-Evaluation 06/14/24    Authorization Type UHC MCR - Auth required    OT Start Time 1017    OT Stop Time 1059    OT Time Calculation (min) 42 min    Activity Tolerance Patient tolerated treatment well    Behavior During Therapy [facility] for tasks assessed/performed           Past Medical History:  Diagnosis Date   Arrhythmia    Atrial fibrillation 10/11/2021   Cerebrovascular disease    moderate per 2021 MRI   Cholecystitis 09/01/2021   Chronic kidney disease, stage 3 unspecified 10/11/2021   Dizzy spells 03/08/2020   Erectile dysfunction 10/11/2021   Essential hypertension 06/14/2015   Essential tremor    Glaucoma 08/31/2021   Hyperlipidemia 06/14/2015   Irregular heart beat    Mild cognitive impairment of uncertain or unknown etiology 05/21/2023   Mixed hyperlipidemia    Multiple lacunar infarcts    bilateral centrum semiovale and right parietal cortex.   Obesity 10/11/2021   Obstructive sleep apnea    no CPAP   Other male erectile dysfunction    Primary open angle glaucoma (POAG) of left eye, moderate stage 10/11/2021   Primary open-angle glaucoma, right eye, mild stage 10/11/2021   Renal insufficiency 09/06/2022   Sinus bradycardia 03/08/2020   Tremor 10/11/2021   Type 2 diabetes mellitus with other specified complication (HCC)    Type II diabetes mellitus 10/11/2021   Vascular dementia [facility])    Past Surgical History:  Procedure Laterality Date   CHOLECYSTECTOMY N/A 08/31/2021   Procedure: LAPAROSCOPIC CHOLECYSTECTOMY;  Surgeon: Lyndel Deward PARAS, MD;  Location: [facility] OR;  Service: General;  Laterality: N/A;   EYE SURGERY      Glaucoma surgery   PROSTATE SURGERY     Patient Active Problem List   Diagnosis Date Noted   Essential tremor    Mixed hyperlipidemia    Irregular heart beat    Other male erectile dysfunction    Type 2 diabetes mellitus with other specified complication (HCC)    Vascular dementia (HCC)    Mild cognitive impairment of uncertain or unknown etiology 05/21/2023   Obstructive sleep apnea    Multiple lacunar infarcts    Cerebrovascular disease    Renal insufficiency 09/06/2022   Atrial fibrillation 10/11/2021   Chronic kidney disease, stage 3 unspecified 10/11/2021   Obesity 10/11/2021   Erectile dysfunction 10/11/2021   Primary open angle glaucoma (POAG) of left eye, moderate stage 10/11/2021   Primary open-angle glaucoma, right eye, mild stage 10/11/2021   Tremor 10/11/2021   Type II diabetes mellitus 10/11/2021   Cholecystitis 09/01/2021   Glaucoma 08/31/2021   Arrhythmia    Dizzy spells 03/08/2020   Sinus bradycardia 03/08/2020   Essential hypertension 06/14/2015   Hyperlipidemia 06/14/2015    ONSET DATE: 02/17/2024 (referral date)   REFERRING DIAG: G20.A1 (ICD-10-CM) - Parkinson's disease without dyskinesia or fluctuating manifestations (HCC)  THERAPY DIAG:  Other lack of coordination  Other symptoms and signs involving the musculoskeletal system  Other symptoms and signs involving the nervous system  Muscle weakness (generalized)  Tremors of nervous system  Rationale for  Evaluation and Treatment: Rehabilitation  SUBJECTIVE:   SUBJECTIVE STATEMENT: No pain today Pt accompanied by: self  PERTINENT HISTORY: PMH: PD (diagnosed 2024), A fb, stage 3 CKD, HLD, HTN, multiple lacunar infarcts, obesity, type II diabetes   PRECAUTIONS: Fall  WEIGHT BEARING RESTRICTIONS: No  PAIN:  Are you having pain? No except sometimes low back pain  FALLS: Has patient fallen in last 6 months? No  LIVING ENVIRONMENT: Lives with: lives with their spouse and lives with their  son Lives in: 1 story home Stairs: Yes: External: 3 steps; bilateral but cannot reach both Has following equipment at home: shower chair and Grab bars  PLOF: Independent with basic ADLs  PATIENT GOALS: just go with the flow  OBJECTIVE:  Note: Objective measures were completed at Evaluation unless otherwise noted.  HAND DOMINANCE: Right  ADLs: Eating: mod I  - pt reports occasional tremors Grooming: mod I  UB Dressing: wife does buttons on dress shirt LB Dressing: mod I - slower  Toileting: mod I  Bathing: mod I (getting down in tub for bathing) Tub Shower transfers: mod I  Equipment: Grab bars  IADLs: Shopping: wife mostly does, but pt will sometimes accompany her. Pt will occasionally go to get smaller purchases Light housekeeping: pt does some laundry and washing dishes, sweeping Meal Prep: wife mostly does (pt every once in awhile does breakfast)  Community mobility: pt drives short distances (avoids highway), wife drives mostly Medication management: pt doing Landscape architect: wife does Handwriting: 100% legible and Mild micrographia w/ decline in legibility if writing longer  MOBILITY STATUS: Independent   FUNCTIONAL OUTCOME MEASURES: Fastening/unfastening 3 buttons: 2 min. 21 sec (w/o glasses and reports difficulty seeing buttons) Physical performance test: PPT#2 (simulated eating) 12.95 sec (holding spoon at end)  & PPT#4 (donning/doffing jacket): 20.22 sec (w/ hospital gown)   04/28/24: fastening/unfastening 3 buttons: 54 sec w/ glasses on  COORDINATION: 9 Hole Peg test: Right: 38.28 sec; Left: 47.56 sec Box and Blocks:  Right 47 blocks, Left 45 blocks Tremors: Resting and Right  UE ROM:  WFL   SENSATION: Pt reports diminished RUE   COGNITION: Overall cognitive status: Impaired and decreased processing, however pt more alert and participatory for today's evaluation  OBSERVATIONS: Bradykinesia, Hypokinesia, and Rt resting tremors, decreased vision  d/t glaucoma                                                                                                                    TREATMENT:   OT reorganized PD binder to separate exercises from general recommendation/education based handouts to promote carryover. Exercises were put in order of exercise chart and titles were highlighted in yellow. Basic handouts were highlighted in blue and placed in back part of binder. He was encouraged to bring to next session.   Reviewed coordination HEP w/ minor adjustments required including use of high arc to flip cards and placement of smiley face in pt's palm to promote desired ROM.   PATIENT EDUCATION: Education details: see above Person  educated: Patient Education method: Explanation, Demonstration, Tactile cues, and Verbal cues Education comprehension: verbalized understanding, returned demonstration, verbal cues required, tactile cues required, and needs further education  HOME EXERCISE PROGRAM: N/A  GOALS: Goals reviewed with patient? Yes  SHORT TERM GOALS: Target date: 05/14/24  Independent with PD specific HEP  (review and update prn)  Baseline: Goal status: IN PROGRESS  2.  Pt will verbalize understanding of adaptive strategies to increase ease with ADLS/IADLS   Baseline:  Goal status: INITIAL  3.  Pt will demonstrate increased ease with dressing as evidenced by decreasing PPT#4 (don/ doff jacket) to 15 secs or less  Baseline: 20.22 sec w/ hospital gown Goal status: INITIAL  4.  Pt will demonstrate improved ease with fastening buttons as evidenced by decreasing 3 button/unbutton time by 30 seconds or more wearing his glasses Baseline: 2 min and 21 sec (w/o glasses), 1:43 at screen 02/17/24 04/28/24: 54 sec w/ glasses Goal status: MET, and revised to performing in less than 50 sec.   5.  Pt will write a paragraph with no significant decrease in size and maintain 100% legibility  Baseline:  Goal status: INITIAL   LONG TERM GOALS:  Target date: 06/14/24  Pt will verbalize understanding of ways to prevent future PD related complications and appropriate community resources prn  Baseline:  Goal status: INITIAL  2.  Pt will verbalize understanding of ways to keep thinking skills sharp and ways to compensate for STM changes  Baseline:  Goal status: INITIAL  3.  Pt will demonstrate improved fine motor coordination for ADLs as evidenced by decreasing 9 hole peg test score for Rt hand by 4 secs  Baseline: 38.28 sec Goal status: INITIAL  4.  Pt will demonstrate improved fine motor coordination for ADLs as evidenced by decreasing 9 hole peg test score for Lt hand by 7 secs or more Baseline: 47.56 sec Goal status: INITIAL  5.  Pt will demonstrate improved ease with fastening buttons as evidenced by decreasing 3 button/unbutton time by 40 seconds with glasses Baseline: 2 min, 21 sec  Goal status: INITIAL  ASSESSMENT:  CLINICAL IMPRESSION: Patient demonstrates better understanding with coordination HEP and navigation of PD Binder. Improved supination and thumb abduction with placement of smiley face in palm of each hand. Will continue to progress towards goals as written.   PERFORMANCE DEFICITS: in functional skills including ADLs, IADLs, coordination, dexterity, sensation, tone, ROM, strength, Fine motor control, Gross motor control, mobility, balance, body mechanics, and UE functional use, cognitive skills including problem solving and safety awareness, and psychosocial skills including coping strategies.   IMPAIRMENTS: are limiting patient from ADLs, IADLs, rest and sleep, leisure, and social participation.   COMORBIDITIES:  may have co-morbidities  that affects occupational performance. Patient will benefit from skilled OT to address above impairments and improve overall function.  REHAB POTENTIAL: Good  PLAN:  OT FREQUENCY: 2x/week  OT DURATION: 8 weeks  PLANNED INTERVENTIONS: 97535 self care/ADL training, 02889  therapeutic exercise, 97530 therapeutic activity, 97112 neuromuscular re-education, 97140 manual therapy, [id number] aquatic therapy, 97039 fluidotherapy, 97760 Orthotic Initial, [id number] Orthotic/Prosthetic subsequent, passive range of motion, functional mobility training, visual/perceptual remediation/compensation, coping strategies training, patient/family education, and DME and/or AE instructions  RECOMMENDED OTHER SERVICES: none at this time  CONSULTED AND AGREED WITH PLAN OF CARE: Patient  PLAN FOR NEXT SESSION: Review PWR! Supine and PWR! All 4'S   Jocelyn CHRISTELLA Bottom, OT 05/06/2024, 11:24 AM

## 2024-05-06 NOTE — Therapy (Signed)
 OUTPATIENT SPEECH LANGUAGE PATHOLOGY PARKINSON'S TREATMENT   Patient Name: Joseph Lamb MRN: [id number] DOB:June 07, 1951, 73 y.o., male Today's Date: 05/06/2024  PCP: Dr. Joen Gentry REFERRING PROVIDER: Dr. Asberry Tat  END OF SESSION:  End of Session - 05/06/24 1046     Visit Number 2    Number of Visits 9    Date for SLP Re-Evaluation 06/22/24    SLP Start Time 1100    SLP Stop Time  1145    SLP Time Calculation (min) 45 min    Activity Tolerance Patient tolerated treatment well           Past Medical History:  Diagnosis Date   Arrhythmia    Atrial fibrillation 10/11/2021   Cerebrovascular disease    moderate per 2021 MRI   Cholecystitis 09/01/2021   Chronic kidney disease, stage 3 unspecified 10/11/2021   Dizzy spells 03/08/2020   Erectile dysfunction 10/11/2021   Essential hypertension 06/14/2015   Essential tremor    Glaucoma 08/31/2021   Hyperlipidemia 06/14/2015   Irregular heart beat    Mild cognitive impairment of uncertain or unknown etiology 05/21/2023   Mixed hyperlipidemia    Multiple lacunar infarcts    bilateral centrum semiovale and right parietal cortex.   Obesity 10/11/2021   Obstructive sleep apnea    no CPAP   Other male erectile dysfunction    Primary open angle glaucoma (POAG) of left eye, moderate stage 10/11/2021   Primary open-angle glaucoma, right eye, mild stage 10/11/2021   Renal insufficiency 09/06/2022   Sinus bradycardia 03/08/2020   Tremor 10/11/2021   Type 2 diabetes mellitus with other specified complication (HCC)    Type II diabetes mellitus 10/11/2021   Vascular dementia [facility])    Past Surgical History:  Procedure Laterality Date   CHOLECYSTECTOMY N/A 08/31/2021   Procedure: LAPAROSCOPIC CHOLECYSTECTOMY;  Surgeon: Lyndel Deward PARAS, MD;  Location: [facility] OR;  Service: General;  Laterality: N/A;   EYE SURGERY     Glaucoma surgery   PROSTATE SURGERY     Patient Active Problem List   Diagnosis Date Noted    Essential tremor    Mixed hyperlipidemia    Irregular heart beat    Other male erectile dysfunction    Type 2 diabetes mellitus with other specified complication (HCC)    Vascular dementia (HCC)    Mild cognitive impairment of uncertain or unknown etiology 05/21/2023   Obstructive sleep apnea    Multiple lacunar infarcts    Cerebrovascular disease    Renal insufficiency 09/06/2022   Atrial fibrillation 10/11/2021   Chronic kidney disease, stage 3 unspecified 10/11/2021   Obesity 10/11/2021   Erectile dysfunction 10/11/2021   Primary open angle glaucoma (POAG) of left eye, moderate stage 10/11/2021   Primary open-angle glaucoma, right eye, mild stage 10/11/2021   Tremor 10/11/2021   Type II diabetes mellitus 10/11/2021   Cholecystitis 09/01/2021   Glaucoma 08/31/2021   Arrhythmia    Dizzy spells 03/08/2020   Sinus bradycardia 03/08/2020   Essential hypertension 06/14/2015   Hyperlipidemia 06/14/2015    ONSET DATE: 02/17/24 referral date   REFERRING DIAG: G20.A1 (ICD-10-CM) - Parkinson's disease without dyskinesia or fluctuating manifestations (HCC)   THERAPY DIAG:  Dysarthria and anarthria  Rationale for Evaluation and Treatment: Rehabilitation  SUBJECTIVE:   SUBJECTIVE STATEMENT: Pt reports has not kept up with HEP since last ST course.  Pt accompanied by: self  PERTINENT HISTORY: recently dx with PD with rigidity, tremor, memory change, sialorrhea, mood change.   PAIN:  Are you having pain? Mild discomfort endorsed, denied pain  FALLS: Has patient fallen in last 6 months?  See PT evaluation for details  LIVING ENVIRONMENT: Lives with: lives with their spouse Lives in: House/apartment  PLOF:  Level of assistance: Needed assistance with IADLS Employment: Retired  PATIENT GOALS: improved communication  OBJECTIVE:  Note: Objective measures were completed at Evaluation unless otherwise noted.  COGNITION: Overall cognitive status: Impaired Areas of  impairment: Memory Comments: Mild cognitive impairment per neuropsych assessment. Reports challenges in completed to-do list items, forgetting evening meds, recall of information from conversation with spouse   ORAL MOTOR EXAMINATION: Overall status: WFL  MOTOR SPEECH EVALUATON assessed across variety of speech tasks: reading, word repetition, generative discourse sample Overall motor speech: impaired Level of impairment: Word Rate of Speech: Reduced Dysfluencies: none evidenced Phonation: low vocal intensity Conversational loudness average: 65 dB Voice Quality: normal Respiration: thoracic breathing Word and Phrasal Stress: reduced use of stress Resonance: WFL Articulation: Appears intact Diadochokinetic Rate (DDK): poorly sequenced Intelligibility: Intelligible Motor planning: Appears intact Effective technique: increased vocal intensity and intentional speech  Stimulability trials: Given SLP modeling and occasional min cues, loudness average increased to 72 dB at the reading (paragraph) level.  Comments: Pt with teach back of intentional speech at onset of session.   Completed audio recording of patients baseline voice without cueing from SLP: No, pt did not have phone with him  DYSPHAGIA EVALUATION SUBJECTIVE DYSPHAGIA REPORTS: Reported symptoms: coughing with both solids and liquids and globus sensation  Current diet: regular and thin liquids  Co-morbid voice changes: No  Motor speech: dysarthria in setting of PD  FACTORS WHICH MAY INCREASE RISK OF ADVERSE EVENT IN PRESENCE OF ASPIRATION:  General health: well appearing  Risk factors: none evident     CLINICAL SWALLOW ASSESSMENT:   Dentition: adequate natural dentition Vocal quality at baseline: low vocal intensity Patient directly observed with POs: Yes: thin liquids  Feeding: able to feed self Liquids provided by: cup [facility] Swallow Protocol: Fail: unable to drink 3oz. No overt s/sx aspiration.  Oral phase signs  and symptoms: none Pharyngeal phase signs and symptoms: none                                                                                                                             TREATMENT DATE:   05/06/24: Reviewed [facility] [employer] website and how to navigate to the What is Parkinson's Video. Instructed he and [name] to watch the video.  Targeted volume and intelligibility using Speak Out! Lesson 4. Pt required occasional  verbal cues, modeling, for intent and warm ups smooth and connected like Lucie He has been completing online practice sessions. Pt averages the following volume levels:  Sustained AH: 84dB average  Counting:86dB  Reading (paragraphs): 83dB  Conversation Exercise: 82dB Required usual mod verbal cues, modeling for carryover of intent /volume reading, ,role playing his name, spelling his name, giving address, DOB for checking in and making  doctors' appointments - required usual verbal clues and modeling to spell with pauses between each letter. Read and role play ordering at [employer] and [employer] his usual orders. Generated and practiced script to alert listener that he has PD and trouble speaking. Add this to HEP  see Patient instructions. Targeted smiling with intent when asking the waitress I need a minute to avoid being seen as angry or rude. Education and script to explain facial expression as well.   04/13/24:Target improving vocal quality and increasing intensity through progressively difficulty speech tasks using Speak Out! program, lesson 1. ST leads pt through exercises providing usual model prior to pt execution. rare min-A required to achieve target dB this date. Averages this date:  Sustained ah 88-92 dB Counting 89 dB Reading 85 dB Cognitive speech task 83 dB  Conversational sample of approx 5 minutes, pt averages 69 dB with occasional mod-A.   PATIENT EDUCATION: Education details: POC, goals, HEP Person educated: Patient Education  method: Medical illustrator Education comprehension: verbalized understanding, returned demonstration, and needs further education  HOME EXERCISE PROGRAM: Speak Out BID   GOALS: Goals reviewed with patient? Yes  SHORT TERM GOALS: Target date: 05/18/2024  Pt will meet or exceed target dB for all Speak Out! therapy tasks with occasional min-A over 2 sessions  Baseline: Goal status: ONGOING  2.  Pt will maintain 70+ dB in 5 minute conversation over 2 sessions  Baseline:  Goal status: ONGOING  3.  Pt will evidence understanding of swallow compensations/strategies via teach back with mod-I Baseline:  Goal status: IONGOING  4.  Pt will employ strategies to ensure attention to conversation with reported benefit over 1 week period Baseline:  Goal status: ONGOING  5.  Pt will be complaint with prescribed HEP over 1 week period Baseline:  Goal status: ONGOING   LONG TERM GOALS: Target date: 06/22/2024  Pt will maintain 70+ dB over 15 minute conversation with rare min-A over 2 sessions Baseline:  Goal status: ONGOING  2.  Pt will verbalize plan for HEP for ongoing practice following ST d/c  Baseline:  Goal status: ONGOING  3.  Pt will utilize external aid resulting in no missed evening med doses over 2 week period  Baseline:  Goal status: ONGOING  4.  Pt will complete daily to-do list tasks with use of strategies/compensations over 1 week period  Baseline:  Goal status: ONGOING  ASSESSMENT:  CLINICAL IMPRESSION: Patient is a 73 y.o. M who was seen today for ST evaluation in setting of PD. Pt presents with mild dysarthria c/b low vocal intensity and volume decay. Pt known to clinic from prior ST course, familiar with Speak Out! program. Additionally presents with complaints of mild cognitive changes, primarily with regards to memory. Pt presents with mild oropharyngeal dysphagia c/b globus sensation and occasional coughing with liquids and solids. Suspect is 2/2 PD,  will plan to teach strategies and compensations, defer MBS for this time. Stable pulmonary status, able to eat regular and thin diet.   OBJECTIVE IMPAIRMENTS: Objective impairments include memory, dysarthria, and dysphagia. These impairments are limiting patient from household responsibilities, effectively communicating at home and in community, and safety when swallowing.Factors affecting potential to achieve goals and functional outcome are co-morbidities. Patient will benefit from skilled SLP services to address above impairments and improve overall function.  REHAB POTENTIAL: Good  PLAN:  SLP FREQUENCY: 2x/week  SLP DURATION: 10 weeks  PLANNED INTERVENTIONS: Aspiration precaution training, Pharyngeal strengthening exercises, Cueing hierachy, Cognitive reorganization, Internal/external aids,  Functional tasks, SLP instruction and feedback, Compensatory strategies, and Patient/family education    Mathis Leita Caldron, CCC-SLP 05/06/2024, 11:53 AM

## 2024-05-10 ENCOUNTER — Ambulatory Visit: Attending: Neurology | Admitting: Physical Therapy

## 2024-05-10 ENCOUNTER — Ambulatory Visit: Admitting: Occupational Therapy

## 2024-05-10 ENCOUNTER — Encounter: Payer: Self-pay | Admitting: Occupational Therapy

## 2024-05-10 VITALS — BP 101/60 | HR 61

## 2024-05-10 DIAGNOSIS — R41840 Attention and concentration deficit: Secondary | ICD-10-CM | POA: Insufficient documentation

## 2024-05-10 DIAGNOSIS — R251 Tremor, unspecified: Secondary | ICD-10-CM | POA: Insufficient documentation

## 2024-05-10 DIAGNOSIS — R29898 Other symptoms and signs involving the musculoskeletal system: Secondary | ICD-10-CM | POA: Insufficient documentation

## 2024-05-10 DIAGNOSIS — R278 Other lack of coordination: Secondary | ICD-10-CM | POA: Diagnosis present

## 2024-05-10 DIAGNOSIS — R293 Abnormal posture: Secondary | ICD-10-CM | POA: Diagnosis present

## 2024-05-10 DIAGNOSIS — M6281 Muscle weakness (generalized): Secondary | ICD-10-CM

## 2024-05-10 DIAGNOSIS — R1310 Dysphagia, unspecified: Secondary | ICD-10-CM | POA: Insufficient documentation

## 2024-05-10 DIAGNOSIS — R29818 Other symptoms and signs involving the nervous system: Secondary | ICD-10-CM | POA: Diagnosis present

## 2024-05-10 DIAGNOSIS — R2689 Other abnormalities of gait and mobility: Secondary | ICD-10-CM | POA: Diagnosis present

## 2024-05-10 DIAGNOSIS — R471 Dysarthria and anarthria: Secondary | ICD-10-CM | POA: Diagnosis present

## 2024-05-10 DIAGNOSIS — R2681 Unsteadiness on feet: Secondary | ICD-10-CM

## 2024-05-10 NOTE — Therapy (Signed)
 OUTPATIENT OCCUPATIONAL THERAPY PARKINSON'S TREATMENT  Patient Name: Joseph Lamb MRN: [id number] DOB:July 02, 1951, 73 y.o., male Today's Date: 05/10/2024  PCP: Loreli Kins, MD REFERRING PROVIDER: Evonnie Asberry RAMAN, DO  END OF SESSION:  OT End of Session - 05/10/24 1107     Visit Number 4    Number of Visits 17    Date for OT Re-Evaluation 06/14/24    Authorization Type [employer] MCR - Auth required    OT Start Time 1105    OT Stop Time 1145    OT Time Calculation (min) 40 min    Activity Tolerance Patient tolerated treatment well    Behavior During Therapy [facility] for tasks assessed/performed           Past Medical History:  Diagnosis Date   Arrhythmia    Atrial fibrillation 10/11/2021   Cerebrovascular disease    moderate per 2021 MRI   Cholecystitis 09/01/2021   Chronic kidney disease, stage 3 unspecified 10/11/2021   Dizzy spells 03/08/2020   Erectile dysfunction 10/11/2021   Essential hypertension 06/14/2015   Essential tremor    Glaucoma 08/31/2021   Hyperlipidemia 06/14/2015   Irregular heart beat    Mild cognitive impairment of uncertain or unknown etiology 05/21/2023   Mixed hyperlipidemia    Multiple lacunar infarcts    bilateral centrum semiovale and right parietal cortex.   Obesity 10/11/2021   Obstructive sleep apnea    no CPAP   Other male erectile dysfunction    Primary open angle glaucoma (POAG) of left eye, moderate stage 10/11/2021   Primary open-angle glaucoma, right eye, mild stage 10/11/2021   Renal insufficiency 09/06/2022   Sinus bradycardia 03/08/2020   Tremor 10/11/2021   Type 2 diabetes mellitus with other specified complication (HCC)    Type II diabetes mellitus 10/11/2021   Vascular dementia [facility])    Past Surgical History:  Procedure Laterality Date   CHOLECYSTECTOMY N/A 08/31/2021   Procedure: LAPAROSCOPIC CHOLECYSTECTOMY;  Surgeon: Lyndel Deward PARAS, MD;  Location: [facility] OR;  Service: General;  Laterality: N/A;   EYE SURGERY      Glaucoma surgery   PROSTATE SURGERY     Patient Active Problem List   Diagnosis Date Noted   Essential tremor    Mixed hyperlipidemia    Irregular heart beat    Other male erectile dysfunction    Type 2 diabetes mellitus with other specified complication (HCC)    Vascular dementia (HCC)    Mild cognitive impairment of uncertain or unknown etiology 05/21/2023   Obstructive sleep apnea    Multiple lacunar infarcts    Cerebrovascular disease    Renal insufficiency 09/06/2022   Atrial fibrillation 10/11/2021   Chronic kidney disease, stage 3 unspecified 10/11/2021   Obesity 10/11/2021   Erectile dysfunction 10/11/2021   Primary open angle glaucoma (POAG) of left eye, moderate stage 10/11/2021   Primary open-angle glaucoma, right eye, mild stage 10/11/2021   Tremor 10/11/2021   Type II diabetes mellitus 10/11/2021   Cholecystitis 09/01/2021   Glaucoma 08/31/2021   Arrhythmia    Dizzy spells 03/08/2020   Sinus bradycardia 03/08/2020   Essential hypertension 06/14/2015   Hyperlipidemia 06/14/2015    ONSET DATE: 02/17/2024 (referral date)   REFERRING DIAG: G20.A1 (ICD-10-CM) - Parkinson's disease without dyskinesia or fluctuating manifestations (HCC)  THERAPY DIAG:  Other lack of coordination  Muscle weakness (generalized)  Abnormal posture  Other symptoms and signs involving the musculoskeletal system  Other symptoms and signs involving the nervous system  Unsteadiness on feet  Attention and concentration deficit  Rationale for Evaluation and Treatment: Rehabilitation  SUBJECTIVE:   SUBJECTIVE STATEMENT: No pain today. No falls. BP lower with previous P.T. session - I took my Ozempic this morning and that's once a week - maybe that's why its lower?  Pt accompanied by: self  PERTINENT HISTORY: PMH: PD (diagnosed 2024), A fb, stage 3 CKD, HLD, HTN, multiple lacunar infarcts, obesity, type II diabetes   PRECAUTIONS: Fall  WEIGHT BEARING RESTRICTIONS: No  PAIN:   Are you having pain? No except sometimes low back pain  FALLS: Has patient fallen in last 6 months? No  LIVING ENVIRONMENT: Lives with: lives with their spouse and lives with their son Lives in: 1 story home Stairs: Yes: External: 3 steps; bilateral but cannot reach both Has following equipment at home: shower chair and Grab bars  PLOF: Independent with basic ADLs  PATIENT GOALS: just go with the flow  OBJECTIVE:  Note: Objective measures were completed at Evaluation unless otherwise noted.  HAND DOMINANCE: Right  ADLs: Eating: mod I  - pt reports occasional tremors Grooming: mod I  UB Dressing: wife does buttons on dress shirt LB Dressing: mod I - slower  Toileting: mod I  Bathing: mod I (getting down in tub for bathing) Tub Shower transfers: mod I  Equipment: Grab bars  IADLs: Shopping: wife mostly does, but pt will sometimes accompany her. Pt will occasionally go to get smaller purchases Light housekeeping: pt does some laundry and washing dishes, sweeping Meal Prep: wife mostly does (pt every once in awhile does breakfast)  Community mobility: pt drives short distances (avoids highway), wife drives mostly Medication management: pt doing Landscape architect: wife does Handwriting: 100% legible and Mild micrographia w/ decline in legibility if writing longer  MOBILITY STATUS: Independent   FUNCTIONAL OUTCOME MEASURES: Fastening/unfastening 3 buttons: 2 min. 21 sec (w/o glasses and reports difficulty seeing buttons) Physical performance test: PPT#2 (simulated eating) 12.95 sec (holding spoon at end)  & PPT#4 (donning/doffing jacket): 20.22 sec (w/ hospital gown)   04/28/24: fastening/unfastening 3 buttons: 54 sec w/ glasses on  COORDINATION: 9 Hole Peg test: Right: 38.28 sec; Left: 47.56 sec Box and Blocks:  Right 47 blocks, Left 45 blocks Tremors: Resting and Right  UE ROM:  WFL   SENSATION: Pt reports diminished RUE   COGNITION: Overall cognitive  status: Impaired and decreased processing, however pt more alert and participatory for today's evaluation  OBSERVATIONS: Bradykinesia, Hypokinesia, and Rt resting tremors, decreased vision d/t glaucoma                                                                                                                    TREATMENT:   Extensively reviewed PWR! Supine (basic 4) with max cueing to move big and activate entire body - especially with PWR! Rock, Twist, and Step - also required cues for sequencing latter ex. Pt demo each x 20 reps. Then went back and reviewed each ex again approx 6 times. Pt instructed to perform EVERY  morning before getting out of bed  BP at end of session still low = 90/57, HR = 67   PATIENT EDUCATION: Education details: see above Person educated: Patient Education method: Explanation, Demonstration, Tactile cues, and Verbal cues Education comprehension: verbalized understanding, returned demonstration, verbal cues required, tactile cues required, and needs further education  HOME EXERCISE PROGRAM: N/A  GOALS: Goals reviewed with patient? Yes  SHORT TERM GOALS: Target date: 05/14/24  Independent with PD specific HEP  (review and update prn)  Baseline: Goal status: IN PROGRESS  2.  Pt will verbalize understanding of adaptive strategies to increase ease with ADLS/IADLS   Baseline:  Goal status: INITIAL  3.  Pt will demonstrate increased ease with dressing as evidenced by decreasing PPT#4 (don/ doff jacket) to 15 secs or less  Baseline: 20.22 sec w/ hospital gown Goal status: INITIAL  4.  Pt will demonstrate improved ease with fastening buttons as evidenced by decreasing 3 button/unbutton time by 30 seconds or more wearing his glasses Baseline: 2 min and 21 sec (w/o glasses), 1:43 at screen 02/17/24 04/28/24: 54 sec w/ glasses Goal status: MET, and revised to performing in less than 50 sec.   5.  Pt will write a paragraph with no significant decrease in size  and maintain 100% legibility  Baseline:  Goal status: INITIAL   LONG TERM GOALS: Target date: 06/14/24  Pt will verbalize understanding of ways to prevent future PD related complications and appropriate community resources prn  Baseline:  Goal status: INITIAL  2.  Pt will verbalize understanding of ways to keep thinking skills sharp and ways to compensate for STM changes  Baseline:  Goal status: INITIAL  3.  Pt will demonstrate improved fine motor coordination for ADLs as evidenced by decreasing 9 hole peg test score for Rt hand by 4 secs  Baseline: 38.28 sec Goal status: INITIAL  4.  Pt will demonstrate improved fine motor coordination for ADLs as evidenced by decreasing 9 hole peg test score for Lt hand by 7 secs or more Baseline: 47.56 sec Goal status: INITIAL  5.  Pt will demonstrate improved ease with fastening buttons as evidenced by decreasing 3 button/unbutton time by 40 seconds with glasses Baseline: 2 min, 21 sec  Goal status: INITIAL  ASSESSMENT:  CLINICAL IMPRESSION: Patient required max cueing and review this date for PWR! Supine. BP low this session but responding well to cues and feels fine. Will continue to progress towards goals as written.   PERFORMANCE DEFICITS: in functional skills including ADLs, IADLs, coordination, dexterity, sensation, tone, ROM, strength, Fine motor control, Gross motor control, mobility, balance, body mechanics, and UE functional use, cognitive skills including problem solving and safety awareness, and psychosocial skills including coping strategies.   IMPAIRMENTS: are limiting patient from ADLs, IADLs, rest and sleep, leisure, and social participation.   COMORBIDITIES:  may have co-morbidities  that affects occupational performance. Patient will benefit from skilled OT to address above impairments and improve overall function.  REHAB POTENTIAL: Good  PLAN:  OT FREQUENCY: 2x/week  OT DURATION: 8 weeks  PLANNED INTERVENTIONS: 97535  self care/ADL training, [id number] therapeutic exercise, 97530 therapeutic activity, 97112 neuromuscular re-education, 97140 manual therapy, [id number] aquatic therapy, 97039 fluidotherapy, 97760 Orthotic Initial, [id number] Orthotic/Prosthetic subsequent, passive range of motion, functional mobility training, visual/perceptual remediation/compensation, coping strategies training, patient/family education, and DME and/or AE instructions  RECOMMENDED OTHER SERVICES: none at this time  CONSULTED AND AGREED WITH PLAN OF CARE: Patient  PLAN FOR NEXT SESSION: Review PWR! Sitting, review  PWR! All 4's w/ modifications prn   Burnard JINNY Roads, OT 05/10/2024, 11:07 AM

## 2024-05-10 NOTE — Therapy (Signed)
 OUTPATIENT PHYSICAL THERAPY NEURO TREATMENT   Patient Name: JEFFERSON FULLAM MRN: [id number] DOB:11-18-1950, 73 y.o., male Today's Date: 05/10/2024   PCP: Loreli Kins, MD REFERRING PROVIDER: Evonnie Asberry RAMAN, DO  END OF SESSION:  PT End of Session - 05/10/24 1018     Visit Number 3    Number of Visits 9   plus eval   Date for PT Re-Evaluation 05/18/24    Authorization Type [employer]    PT Start Time 1017    PT Stop Time 1101    PT Time Calculation (min) 44 min    Equipment Utilized During Treatment Gait belt    Activity Tolerance Patient tolerated treatment well;Other (comment)   Dizziness and hypotension   Behavior During Therapy [facility] for tasks assessed/performed;Flat affect           Past Medical History:  Diagnosis Date   Arrhythmia    Atrial fibrillation 10/11/2021   Cerebrovascular disease    moderate per 2021 MRI   Cholecystitis 09/01/2021   Chronic kidney disease, stage 3 unspecified 10/11/2021   Dizzy spells 03/08/2020   Erectile dysfunction 10/11/2021   Essential hypertension 06/14/2015   Essential tremor    Glaucoma 08/31/2021   Hyperlipidemia 06/14/2015   Irregular heart beat    Mild cognitive impairment of uncertain or unknown etiology 05/21/2023   Mixed hyperlipidemia    Multiple lacunar infarcts    bilateral centrum semiovale and right parietal cortex.   Obesity 10/11/2021   Obstructive sleep apnea    no CPAP   Other male erectile dysfunction    Primary open angle glaucoma (POAG) of left eye, moderate stage 10/11/2021   Primary open-angle glaucoma, right eye, mild stage 10/11/2021   Renal insufficiency 09/06/2022   Sinus bradycardia 03/08/2020   Tremor 10/11/2021   Type 2 diabetes mellitus with other specified complication (HCC)    Type II diabetes mellitus 10/11/2021   Vascular dementia [facility])    Past Surgical History:  Procedure Laterality Date   CHOLECYSTECTOMY N/A 08/31/2021   Procedure: LAPAROSCOPIC CHOLECYSTECTOMY;  Surgeon:  Lyndel Deward PARAS, MD;  Location: [facility] OR;  Service: General;  Laterality: N/A;   EYE SURGERY     Glaucoma surgery   PROSTATE SURGERY     Patient Active Problem List   Diagnosis Date Noted   Essential tremor    Mixed hyperlipidemia    Irregular heart beat    Other male erectile dysfunction    Type 2 diabetes mellitus with other specified complication (HCC)    Vascular dementia (HCC)    Mild cognitive impairment of uncertain or unknown etiology 05/21/2023   Obstructive sleep apnea    Multiple lacunar infarcts    Cerebrovascular disease    Renal insufficiency 09/06/2022   Atrial fibrillation 10/11/2021   Chronic kidney disease, stage 3 unspecified 10/11/2021   Obesity 10/11/2021   Erectile dysfunction 10/11/2021   Primary open angle glaucoma (POAG) of left eye, moderate stage 10/11/2021   Primary open-angle glaucoma, right eye, mild stage 10/11/2021   Tremor 10/11/2021   Type II diabetes mellitus 10/11/2021   Cholecystitis 09/01/2021   Glaucoma 08/31/2021   Arrhythmia    Dizzy spells 03/08/2020   Sinus bradycardia 03/08/2020   Essential hypertension 06/14/2015   Hyperlipidemia 06/14/2015    ONSET DATE: 02/17/2024 (referral)   REFERRING DIAG: G20.A1 (ICD-10-CM) - 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 Reports doing well. Has been working on his HEP some. No falls.    Pt accompanied by: self (wife in the car)   PERTINENT HISTORY: PD, A fb, stage 3 CKD, HLD, HTN, multiple lacunar infarcts,  obesity, type II diabetes  PAIN:  Are you having pain? Pt has chronic bilateral hip pain that started after he started his heart medicine    PRECAUTIONS: Fall  RED FLAGS: None   WEIGHT BEARING RESTRICTIONS: No  FALLS: Has patient fallen in last 6 months? No  LIVING ENVIRONMENT: Lives with: lives with their spouse Lives in: House/apartment Stairs: Yes: External: 2 in front, 8 in back steps; pt cannot recall if he has rails Has following equipment at home: shower chair and Grab bars  PLOF: Independent  PATIENT GOALS: I need to reengage the physical part of this therapy on a regular basis. I have been kind of lax   OBJECTIVE:  Note: Objective measures were completed at Evaluation unless otherwise noted.  DIAGNOSTIC FINDINGS: None relevant   COGNITION: Overall cognitive status: History of cognitive impairments - at baseline   SENSATION: Pt denies significant numbness/tingling in all extremities   EDEMA: None currently    MUSCLE TONE: RLE: Rigidity   POSTURE: rounded shoulders, forward head, increased thoracic kyphosis, weight shift right, and tremor in RUE   LOWER EXTREMITY ROM:     Active  Right Eval Left Eval  Hip flexion    Hip extension    Hip abduction    Hip adduction    Hip internal rotation    Hip external rotation    Knee flexion    Knee extension    Ankle dorsiflexion    Ankle plantarflexion    Ankle inversion    Ankle eversion     (Blank rows = not tested)  LOWER EXTREMITY MMT:    MMT Right Eval Left Eval  Hip flexion    Hip extension    Hip abduction    Hip adduction    Hip internal rotation    Hip external rotation    Knee flexion    Knee extension    Ankle dorsiflexion    Ankle plantarflexion    Ankle inversion    Ankle eversion    (Blank rows = not tested)  BED MOBILITY:  Not tested Pt reports he  frequently sleeps on the couch. Is slow getting into/out of the bed   TRANSFERS: Sit to stand: Modified independence   Assistive device utilized: None     Stand to sit: Modified independence  Assistive device utilized: None      RAMP:  Not tested  CURB:  Not tested  STAIRS: Not tested GAIT: Gait pattern: step through pattern, decreased arm swing- Right, decreased stride length, decreased hip/knee flexion- Right, decreased hip/knee flexion- Left, shuffling, lateral hip instability, decreased trunk rotation, trunk flexed, poor foot clearance- Right, and poor foot clearance- Left Distance walked: Various clinic distances  Assistive device utilized: None Level of assistance: Modified independence Comments: Lateral deviations noted when performing sit > stand> walk but otherwise no instability noted    FUNCTIONAL TESTS:       VITALS  Vitals:   05/10/24 1032 05/10/24 1038 05/10/24 1057  BP: (!) 89/56 Comment: After scifit (!) 100/58 Comment: After snack and water 101/60  Pulse: 61 (!) 58 61                                                                                                                                TREATMENT:   Therapeutic Activity/Self-care SciFit multi-peaks level 5.5 for 8 minutes using BUE/BLEs for neural priming for reciprocal movement, dynamic cardiovascular warmup and increased amplitude of stepping. Cued for pt to work towards 6-7/10 RPE. Pt rated RPE of 6/10 following activity.  Assessed pt's vitals (see above) as pt reported feeling dizzy, rating dizziness as 4/10. Pt's BP low, so provided water and peanut butter crackers as pt reports he did not eat this morning. Reassessed vitals after several minutes (see above) and BP did slightly improve but pt continued to report dizziness, so performed rest of session in seated/quadruped position.  Seated march overs using 15# KB, 1x15 and 1x10 reps per side for improved step clearance and imitation of swinging legs over bathtub. Man this will tire you out real quick. Added to HEP. Min cues to maintain stepping over KB rather than  around it and noted decreased amplitude of movement w/increased reps.  Pt assumed quadruped position on mat mod I and performed alt spiderman lunges, x8 reps per side, for improved lateral weight shifting, step clearance and hip strength. Noted decreased step clearance w/RLE > LLE but pt reported enjoying activity.  Discussed technique to get into/out of bathtub as pt reports this is very difficult for him. Pt currently performing supine > sidelying> quadruped in bathtub and pt reports this hurts his elbows and hips. Recommended pt obtain tub bench and sit on this to bathe instead, as getting into/out of bathtub is a safety risk. Pt in agreement with this and is open to modifying bathing technique. Will continue to discuss in PT/OT  Reassessed vitals (see above) and BP WNL.    PATIENT EDUCATION: Education details: Continue to monitor vitals at home today, safety w/bathtub transfer, updates to HEP Person educated: Patient  Education method: Explanation, Demonstration, Verbal cues, and Handouts Education comprehension: verbalized understanding, returned demonstration, verbal cues required, and needs further education  HOME EXERCISE PROGRAM: To be reviewed from previous POC:  [id number], standing/quadruped PWR moves, fwd/retro stepping  (Reviewed all except quadruped PWR moves on 7/23)  GOALS: Goals reviewed with patient? Yes  STG = LTG DUE TO POC LENGTH    LONG TERM GOALS: Target date: 05/11/2024   Pt will be independent with PD-specific HEP for improved strength, balance, transfers and gait.  Baseline: not performing regularly  Goal status: INITIAL  2.  Pt will score >/= 23/28 on MiniBest for improved stepping strategies and reduced fall risk  Baseline: 20/28 Goal status: INITIAL  3.  Pt will perform 5 sit to stands from 14 height w/no UE support for improved functional BLE strength and transfers  Baseline: reports difficulty standing from the couch  Goal status: INITIAL  4.  Pt  will perform floor transfer mod I for improved functional BLE strength and transferring into/out of bathtub.  Baseline:  Goal status: INITIAL   ASSESSMENT:  CLINICAL IMPRESSION: Session limited as pt's BP hypotensive and pt reporting dizziness. Emphasis of skilled PT session on increased step clearance, lateral weight shifting and safety with transferring into/out of the bathtub at home. Pt's BP low at beginning of session but did improve w/water and peanut butter crackers. Pt reports he did not eat prior to session, so recommended pt continue to monitor BP at home and eat prior to next session. Pt reports he is most challenged when getting out of the bathtub at home and has difficulty coming to stand from quadruped position. Encouraged pt to consider obtaining tub bench and sitting on this instead of in the bath, as getting into the bathtub is not the safest option. Pt reports he will think about this. Will continue per POC.    OBJECTIVE IMPAIRMENTS: Abnormal gait, decreased activity tolerance, decreased balance, decreased cognition, decreased coordination, decreased knowledge of use of DME, difficulty walking, decreased strength, decreased safety awareness, impaired perceived functional ability, improper body mechanics, postural dysfunction, and pain  ACTIVITY LIMITATIONS: carrying, lifting, squatting, stairs, locomotion level, and caring for others  PARTICIPATION LIMITATIONS: driving, shopping, community activity, and yard work  PERSONAL FACTORS: Fitness and 1 comorbidity: PD are also affecting patient's functional outcome.   REHAB POTENTIAL: Good  CLINICAL DECISION MAKING: Evolving/moderate complexity  EVALUATION COMPLEXITY: Moderate  PLAN:  PT FREQUENCY: 2x/week  PT DURATION: 4 weeks  PLANNED INTERVENTIONS: 97164- PT Re-evaluation, 97750- Physical Performance Testing, 97110-Therapeutic exercises, 97530- Therapeutic activity, [id number]- Neuromuscular re-education, 97535- Self Care,  97140- Manual therapy, [id number]- Gait training, 04007- Canalith repositioning, [id number]- Aquatic Therapy, [phone] (1-2 muscles), 20561 (3+ muscles)- Dry Needling, Patient/Family education, Balance training, Stair training, Joint mobilization, Spinal mobilization, Vestibular training, and DME instructions  PLAN FOR NEXT SESSION: Goals and recert. Work on stepping strategies, lateral weight shifting, sitting from low surfaces    Miamor Ayler E Trenia Tennyson, PT, DPT 05/10/2024, 11:52 AM

## 2024-05-11 NOTE — Therapy (Unsigned)
 OUTPATIENT SPEECH LANGUAGE PATHOLOGY PARKINSON'S TREATMENT   Patient Name: Joseph Lamb MRN: [id number] DOB:May 25, 1951, 73 y.o., male Today's Date: 05/12/2024  PCP: Dr. Joen Gentry REFERRING PROVIDER: Dr. Asberry Tat  END OF SESSION:  End of Session - 05/12/24 1021     Visit Number 3    Number of Visits 9    Date for SLP Re-Evaluation 06/22/24    SLP Start Time 1021   arrived late   SLP Stop Time  1100    SLP Time Calculation (min) 39 min    Activity Tolerance Patient tolerated treatment well            Past Medical History:  Diagnosis Date   Arrhythmia    Atrial fibrillation 10/11/2021   Cerebrovascular disease    moderate per 2021 MRI   Cholecystitis 09/01/2021   Chronic kidney disease, stage 3 unspecified 10/11/2021   Dizzy spells 03/08/2020   Erectile dysfunction 10/11/2021   Essential hypertension 06/14/2015   Essential tremor    Glaucoma 08/31/2021   Hyperlipidemia 06/14/2015   Irregular heart beat    Mild cognitive impairment of uncertain or unknown etiology 05/21/2023   Mixed hyperlipidemia    Multiple lacunar infarcts    bilateral centrum semiovale and right parietal cortex.   Obesity 10/11/2021   Obstructive sleep apnea    no CPAP   Other male erectile dysfunction    Primary open angle glaucoma (POAG) of left eye, moderate stage 10/11/2021   Primary open-angle glaucoma, right eye, mild stage 10/11/2021   Renal insufficiency 09/06/2022   Sinus bradycardia 03/08/2020   Tremor 10/11/2021   Type 2 diabetes mellitus with other specified complication (HCC)    Type II diabetes mellitus 10/11/2021   Vascular dementia [facility])    Past Surgical History:  Procedure Laterality Date   CHOLECYSTECTOMY N/A 08/31/2021   Procedure: LAPAROSCOPIC CHOLECYSTECTOMY;  Surgeon: Lyndel Deward PARAS, MD;  Location: [facility] OR;  Service: General;  Laterality: N/A;   EYE SURGERY     Glaucoma surgery   PROSTATE SURGERY     Patient Active Problem List   Diagnosis Date  Noted   Essential tremor    Mixed hyperlipidemia    Irregular heart beat    Other male erectile dysfunction    Type 2 diabetes mellitus with other specified complication (HCC)    Vascular dementia (HCC)    Mild cognitive impairment of uncertain or unknown etiology 05/21/2023   Obstructive sleep apnea    Multiple lacunar infarcts    Cerebrovascular disease    Renal insufficiency 09/06/2022   Atrial fibrillation 10/11/2021   Chronic kidney disease, stage 3 unspecified 10/11/2021   Obesity 10/11/2021   Erectile dysfunction 10/11/2021   Primary open angle glaucoma (POAG) of left eye, moderate stage 10/11/2021   Primary open-angle glaucoma, right eye, mild stage 10/11/2021   Tremor 10/11/2021   Type II diabetes mellitus 10/11/2021   Cholecystitis 09/01/2021   Glaucoma 08/31/2021   Arrhythmia    Dizzy spells 03/08/2020   Sinus bradycardia 03/08/2020   Essential hypertension 06/14/2015   Hyperlipidemia 06/14/2015    ONSET DATE: 02/17/24 referral date   REFERRING DIAG: G20.A1 (ICD-10-CM) - 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   PAIN:  Are you having pain? Mild discomfort endorsed, denied pain  FALLS: Has patient fallen in last 6 months?  See PT evaluation for details  LIVING ENVIRONMENT: Lives with: lives with their spouse Lives in: House/apartment  PLOF:  Level of assistance: Needed assistance with IADLS Employment: Retired  PATIENT GOALS: improved communication  OBJECTIVE:  Note: Objective measures were completed at Evaluation unless otherwise noted.  COGNITION: Overall cognitive status:  Impaired Areas of impairment: Memory Comments: Mild cognitive impairment per neuropsych assessment. Reports challenges in completed to-do list items, forgetting evening meds, recall of information from conversation with spouse   ORAL MOTOR EXAMINATION: Overall status: WFL  MOTOR SPEECH EVALUATON assessed across variety of speech tasks: reading, word repetition, generative discourse sample Overall motor speech: impaired Level of impairment: Word Rate of Speech: Reduced Dysfluencies: none evidenced Phonation: low vocal intensity Conversational loudness average: 65 dB Voice Quality: normal Respiration: thoracic breathing Word and Phrasal Stress: reduced use of stress Resonance: WFL Articulation: Appears intact Diadochokinetic Rate (DDK): poorly sequenced Intelligibility: Intelligible Motor planning: Appears intact Effective technique: increased vocal intensity and intentional speech  Stimulability trials: Given SLP modeling and occasional min cues, loudness average increased to 72 dB at the reading (paragraph) level.  Comments: Pt with teach back of intentional speech at onset of session.   Completed audio recording of patients baseline voice without cueing from SLP: No, pt did not have phone with him  DYSPHAGIA EVALUATION SUBJECTIVE DYSPHAGIA REPORTS: Reported symptoms: coughing with both solids and liquids and globus sensation  Current diet: regular and thin liquids  Co-morbid voice changes: No  Motor speech: dysarthria in setting of PD  FACTORS WHICH MAY INCREASE RISK OF ADVERSE EVENT IN PRESENCE OF ASPIRATION:  General health: well appearing  Risk factors: none evident     CLINICAL SWALLOW ASSESSMENT:   Dentition: adequate natural dentition Vocal quality at baseline: low vocal intensity Patient directly observed with POs: Yes: thin liquids  Feeding: able to feed self Liquids provided by: cup [facility] Swallow Protocol: Fail: unable to drink 3oz. No overt s/sx aspiration.   Oral phase signs and symptoms: none Pharyngeal phase signs and symptoms: none                                                                                                                             TREATMENT DATE:  05/12/24: Entered w/ WNL volume, which quickly faded to mid 60s dB. Solely completing warm-up and counting exercises at home. Indicated difficulty completing whole lesson independently. Recommended online Speak Out! Lessons to aid completion. Demonstrated how to navigate [employer], with handout provided to support recall. Targeted Lesson #5, with rare min A provided to achieve targeted dB levels:  Sustained AH: 84 dB  Counting: 85 dB Reading: 85 dB Conversation Exercise: 81 dB Discourse Level: 70 dB with usual min A to carryover intent  Endorsed recently improved dysphagia. Re-educated intentional swallow with all consistencies, in which pt verbalized understanding. No overt s/sx  of aspiration exhibited with thin liquids today.   05/06/24: Reviewed Parkinson [employer] website and how to navigate to the What is Parkinson's Video. Instructed he and [name] to watch the video.  Targeted volume and intelligibility using Speak Out! Lesson 4. Pt required occasional  verbal cues, modeling, for intent and warm ups smooth and connected like Lucie He has been completing online practice sessions. Pt averages the following volume levels:  Sustained AH: 84dB average  Counting:86dB  Reading (paragraphs): 83dB  Conversation Exercise: 82dB Required usual mod verbal cues, modeling for carryover of intent /volume reading, ,role playing his name, spelling his name, giving address, DOB for checking in and making doctors' appointments - required usual verbal clues and modeling to spell with pauses between each letter. Read and role play ordering at [employer] and [employer] his usual orders. Generated and practiced script to alert listener that he has PD and trouble  speaking. Add this to HEP  see Patient instructions. Targeted smiling with intent when asking the waitress I need a minute to avoid being seen as angry or rude. Education and script to explain facial expression as well.   04/13/24:Target improving vocal quality and increasing intensity through progressively difficulty speech tasks using Speak Out! program, lesson 1. ST leads pt through exercises providing usual model prior to pt execution. rare min-A required to achieve target dB this date. Averages this date:  Sustained ah 88-92 dB Counting 89 dB Reading 85 dB Cognitive speech task 83 dB  Conversational sample of approx 5 minutes, pt averages 69 dB with occasional mod-A.   PATIENT EDUCATION: Education details: POC, goals, HEP Person educated: Patient Education method: Medical illustrator Education comprehension: verbalized understanding, returned demonstration, and needs further education  HOME EXERCISE PROGRAM: Speak Out BID   GOALS: Goals reviewed with patient? Yes  SHORT TERM GOALS: Target date: 05/18/2024  Pt will meet or exceed target dB for all Speak Out! therapy tasks with occasional min-A over 2 sessions  Baseline: Goal status: MET  2.  Pt will maintain 70+ dB in 5 minute conversation over 2 sessions  Baseline:  Goal status: ONGOING  3.  Pt will evidence understanding of swallow compensations/strategies via teach back with mod-I Baseline:  Goal status: MET  4.  Pt will employ strategies to ensure attention to conversation with reported benefit over 1 week period Baseline:  Goal status: ONGOING  5.  Pt will be complaint with prescribed HEP over 1 week period Baseline:  Goal status: ONGOING   LONG TERM GOALS: Target date: 06/22/2024  Pt will maintain 70+ dB over 15 minute conversation with rare min-A over 2 sessions Baseline:  Goal status: ONGOING  2.  Pt will verbalize plan for HEP for ongoing practice following ST d/c  Baseline:  Goal status:  ONGOING  3.  Pt will utilize external aid resulting in no missed evening med doses over 2 week period  Baseline:  Goal status: ONGOING  4.  Pt will complete daily to-do list tasks with use of strategies/compensations over 1 week period  Baseline:  Goal status: ONGOING  ASSESSMENT:  CLINICAL IMPRESSION: Patient is a 73 y.o. M who was seen today for ST tx in setting of PD. Pt presents with mild dysarthria c/b low vocal intensity and volume decay. Pt known to clinic from prior ST course, familiar with Speak Out! program. Additionally presents with complaints of mild cognitive changes, primarily with regards to memory. Pt presents with mild oropharyngeal dysphagia c/b globus sensation and occasional coughing  with liquids and solids. Suspect is 2/2 PD, will plan to teach strategies and compensations, defer MBS for this time. Stable pulmonary status, able to eat regular and thin diet. Continued education and instruction of Speak Out! Program and principles, with usual min/mod A to carryover into conversation.   OBJECTIVE IMPAIRMENTS: Objective impairments include memory, dysarthria, and dysphagia. These impairments are limiting patient from household responsibilities, effectively communicating at home and in community, and safety when swallowing.Factors affecting potential to achieve goals and functional outcome are co-morbidities. Patient will benefit from skilled SLP services to address above impairments and improve overall function.  REHAB POTENTIAL: Good  PLAN:  SLP FREQUENCY: 2x/week  SLP DURATION: 10 weeks  PLANNED INTERVENTIONS: Aspiration precaution training, Pharyngeal strengthening exercises, Cueing hierachy, Cognitive reorganization, Internal/external aids, Functional tasks, SLP instruction and feedback, Compensatory strategies, and Patient/family education    Comer LILLETTE Louder, CCC-SLP 05/12/2024, 11:02 AM

## 2024-05-12 ENCOUNTER — Ambulatory Visit

## 2024-05-12 ENCOUNTER — Ambulatory Visit: Admitting: Physical Therapy

## 2024-05-12 ENCOUNTER — Ambulatory Visit: Admitting: Occupational Therapy

## 2024-05-12 VITALS — BP 130/67 | HR 57

## 2024-05-12 DIAGNOSIS — R29898 Other symptoms and signs involving the musculoskeletal system: Secondary | ICD-10-CM

## 2024-05-12 DIAGNOSIS — R278 Other lack of coordination: Secondary | ICD-10-CM

## 2024-05-12 DIAGNOSIS — M6281 Muscle weakness (generalized): Secondary | ICD-10-CM

## 2024-05-12 DIAGNOSIS — R1310 Dysphagia, unspecified: Secondary | ICD-10-CM

## 2024-05-12 DIAGNOSIS — R251 Tremor, unspecified: Secondary | ICD-10-CM

## 2024-05-12 DIAGNOSIS — R2689 Other abnormalities of gait and mobility: Secondary | ICD-10-CM

## 2024-05-12 DIAGNOSIS — R29818 Other symptoms and signs involving the nervous system: Secondary | ICD-10-CM

## 2024-05-12 DIAGNOSIS — R2681 Unsteadiness on feet: Secondary | ICD-10-CM

## 2024-05-12 DIAGNOSIS — R471 Dysarthria and anarthria: Secondary | ICD-10-CM

## 2024-05-12 DIAGNOSIS — R293 Abnormal posture: Secondary | ICD-10-CM

## 2024-05-12 NOTE — Therapy (Signed)
 OUTPATIENT PHYSICAL THERAPY NEURO TREATMENT - RECERTIFICATION   Patient Name: DELTON STELLE MRN: [id number] DOB:[birth year], 73 y.o., male Today's Date: 05/12/2024   PCP: Loreli Kins, MD REFERRING PROVIDER: Evonnie Asberry RAMAN, DO  END OF SESSION:  PT End of Session - 05/12/24 1101     Visit Number 4    Number of Visits 9   plus eval   Date for PT Re-Evaluation 06/09/24   Recert to cover remaining visits   Authorization Type [employer]    PT Start Time 1100    PT Stop Time 1142    PT Time Calculation (min) 42 min    Equipment Utilized During Treatment Gait belt    Activity Tolerance Patient tolerated treatment well    Behavior During Therapy [facility] for tasks assessed/performed;Flat affect            Past Medical History:  Diagnosis Date   Arrhythmia    Atrial fibrillation 10/11/2021   Cerebrovascular disease    moderate per 2021 MRI   Cholecystitis 09/01/2021   Chronic kidney disease, stage 3 unspecified 10/11/2021   Dizzy spells 03/08/2020   Erectile dysfunction 10/11/2021   Essential hypertension 06/14/2015   Essential tremor    Glaucoma 08/31/2021   Hyperlipidemia 06/14/2015   Irregular heart beat    Mild cognitive impairment of uncertain or unknown etiology 05/21/2023   Mixed hyperlipidemia    Multiple lacunar infarcts    bilateral centrum semiovale and right parietal cortex.   Obesity 10/11/2021   Obstructive sleep apnea    no CPAP   Other male erectile dysfunction    Primary open angle glaucoma (POAG) of left eye, moderate stage 10/11/2021   Primary open-angle glaucoma, right eye, mild stage 10/11/2021   Renal insufficiency 09/06/2022   Sinus bradycardia 03/08/2020   Tremor 10/11/2021   Type 2 diabetes mellitus with other specified complication (HCC)    Type II diabetes mellitus 10/11/2021   Vascular dementia [facility])    Past Surgical History:  Procedure Laterality Date   CHOLECYSTECTOMY N/A 08/31/2021   Procedure: LAPAROSCOPIC CHOLECYSTECTOMY;   Surgeon: Lyndel Deward PARAS, MD;  Location: [facility] OR;  Service: General;  Laterality: N/A;   EYE SURGERY     Glaucoma surgery   PROSTATE SURGERY     Patient Active Problem List   Diagnosis Date Noted   Essential tremor    Mixed hyperlipidemia    Irregular heart beat    Other male erectile dysfunction    Type 2 diabetes mellitus with other specified complication (HCC)    Vascular dementia (HCC)    Mild cognitive impairment of uncertain or unknown etiology 05/21/2023   Obstructive sleep apnea    Multiple lacunar infarcts    Cerebrovascular disease    Renal insufficiency 09/06/2022   Atrial fibrillation 10/11/2021   Chronic kidney disease, stage 3 unspecified 10/11/2021   Obesity 10/11/2021   Erectile dysfunction 10/11/2021   Primary open angle glaucoma (POAG) of left eye, moderate stage 10/11/2021   Primary open-angle glaucoma, right eye, mild stage 10/11/2021   Tremor 10/11/2021   Type II diabetes mellitus 10/11/2021   Cholecystitis 09/01/2021   Glaucoma 08/31/2021   Arrhythmia    Dizzy spells 03/08/2020   Sinus bradycardia 03/08/2020   Essential hypertension 06/14/2015   Hyperlipidemia 06/14/2015    ONSET DATE: 02/17/2024 (referral)   REFERRING DIAG: G20.A1 (ICD-10-CM) - Parkinson's disease without dyskinesia or fluctuating manifestations (HCC)  THERAPY DIAG:  Unsteadiness on feet  Other abnormalities of gait and mobility  Muscle weakness (generalized)  Other lack of coordination  Rationale for Evaluation and Treatment: Rehabilitation  SUBJECTIVE:                                                                                                                                                                                             SUBJECTIVE STATEMENT: Pt presents without AD. Reports doing well. Feels better today than last session. No falls. Pt denies dizziness today.    Pt accompanied by: self (wife in the car)   PERTINENT HISTORY: PD, A fb, stage 3 CKD,  HLD, HTN, multiple lacunar infarcts, obesity, type II diabetes  PAIN:  Are you having pain? Pt has chronic bilateral hip pain that started after he started his heart medicine    PRECAUTIONS: Fall  RED FLAGS: None   WEIGHT BEARING RESTRICTIONS: No  FALLS: Has patient fallen in last 6 months? No  LIVING ENVIRONMENT: Lives with: lives with their spouse Lives in: House/apartment Stairs: Yes: External: 2 in front, 8 in back steps; pt cannot recall if he has rails Has following equipment at home: shower chair and Grab bars  PLOF: Independent  PATIENT GOALS: I need to reengage the physical part of this therapy on a regular basis. I have been kind of lax   OBJECTIVE:  Note: Objective measures were completed at Evaluation unless otherwise noted.  DIAGNOSTIC FINDINGS: None relevant   COGNITION: Overall cognitive status: History of cognitive impairments - at baseline   SENSATION: Pt denies significant numbness/tingling in all extremities   EDEMA: None currently    MUSCLE TONE: RLE: Rigidity   POSTURE: rounded shoulders, forward head, increased thoracic kyphosis, weight shift right, and tremor in RUE   LOWER EXTREMITY ROM:     Active  Right Eval Left Eval  Hip flexion    Hip extension    Hip abduction    Hip adduction    Hip internal rotation    Hip external rotation    Knee flexion    Knee extension    Ankle dorsiflexion    Ankle plantarflexion    Ankle inversion    Ankle eversion     (Blank rows = not tested)  LOWER EXTREMITY MMT:    MMT Right Eval Left Eval  Hip flexion    Hip extension    Hip abduction    Hip adduction    Hip internal rotation    Hip external rotation    Knee flexion    Knee extension    Ankle dorsiflexion    Ankle plantarflexion    Ankle inversion    Ankle eversion    (Blank rows = not tested)  BED MOBILITY:  Not tested Pt reports he frequently sleeps on the couch. Is slow getting into/out of the bed   TRANSFERS: Sit to  stand: Modified independence  Assistive device utilized: None     Stand to sit: Modified independence  Assistive device utilized: None      RAMP:  Not tested  CURB:  Not tested  STAIRS: Not tested GAIT: Gait pattern: step through pattern, decreased arm swing- Right, decreased stride length, decreased hip/knee flexion- Right, decreased hip/knee flexion- Left, shuffling, lateral hip instability, decreased trunk rotation, trunk flexed, poor foot clearance- Right, and poor foot clearance- Left Distance walked: Various clinic distances  Assistive device utilized: None Level of assistance: Modified independence Comments: Lateral deviations noted when performing sit > stand> walk but otherwise no instability noted    FUNCTIONAL TESTS:       VITALS  Vitals:   05/12/24 1104  BP: 130/67  Pulse: (!) 57                                                                                                                                 TREATMENT:   Therapeutic Activity/Self-care/NMR Assessed pt's vitals (see above) and WNL this date.  SciFit multi-peaks level 12.0 for 8 minutes using BUE/BLEs for neural priming for reciprocal movement, dynamic cardiovascular warmup and increased amplitude of stepping. Cued pt to maintain steps/min >80 throughout. RPE of 4/10 following activity  Practiced floor transfer technique for LTG assessment and pt able to perform full floor transfer w/no cues at mod I level.  On red floor mat, practiced modified [place] get up to L side to imitate transfer out of bathtub. Pt performed 4 times w/S* and verbal cues initially and progressed to mod I on final 2 reps. Pt able to perform supine > propped on L elbow > modified plank position on L side > tall kneel > stand. Pt reports this technique is easier than current technique he is using and will practice at home to get out of tub. Strongly emphasized importance of safety w/transfer and ensuring he has good grip w/LUE prior  to attempting to prevent slip injury. Pt verbalized understanding.  Sit to stands from 14 box w/yoga mat folded in half on top, x10 reps. Pt tends to keep weight in heels, but no posterior instability noted. Cued pt to lightly press on gas pedals to facilitate weight through entirety of foot, which worked well for pt.  Progressed to sit to stands x6 reps from tumbleform lying horizontally on ground, min A from therapist to stabilize tumbleform only. Pt reports this is easier than standing from box.    PATIENT EDUCATION: Education details: Updated bathtub transfer  Person educated: Patient Education method: Explanation, Demonstration, and Verbal cues Education comprehension: verbalized understanding, returned demonstration, verbal cues required, and needs further education  HOME EXERCISE PROGRAM: To be reviewed from previous POC:  [id number], standing/quadruped PWR moves, fwd/retro stepping  (Reviewed all except quadruped  PWR moves on 7/23)  GOALS: Goals reviewed with patient? Yes  STG = LTG DUE TO POC LENGTH    LONG TERM GOALS: Target date: 05/11/2024   Pt will be independent with PD-specific HEP for improved strength, balance, transfers and gait.  Baseline: not performing regularly  Goal status: IN PROGRESS   2.  Pt will score >/= 23/28 on MiniBest for improved stepping strategies and reduced fall risk  Baseline: 20/28 Goal status: IN PROGRESS   3.  Pt will perform 5 sit to stands from 14 height w/no UE support for improved functional BLE strength and transfers  Baseline: reports difficulty standing from the couch  Goal status: MET  4.  Pt will perform floor transfer mod I for improved functional BLE strength and transferring into/out of bathtub.  Baseline:  Goal status: MET   NEW LONG TERM GOALS FOR EXTENDED POC: Target date: 06/09/2024   Pt will be independent with PD-specific HEP for improved strength, balance, transfers and gait.  Baseline: not performing regularly   Goal status: IN PROGRESS  2.  Pt will score >/= 23/28 on MiniBest for improved stepping strategies and reduced fall risk  Baseline: 20/28 Goal status: IN PROGRESS    ASSESSMENT:  CLINICAL IMPRESSION: Emphasis of skilled PT session on safety w/floor transfers, bathtub transfers and LTG assessment. Pt's BP WNL this date and pt denied dizziness. Pt has met 2 of 4 LTGs thus far, performing 10 sit to stands from 14 surface without LOB or UE support and floor transfers at mod I level. Educated pt on using modified [place] get up technique to get out of the bathtub, as pt reports this is the hardest thing he does. Pt performed technique well w/no assistance required and reports it is easier than current method. Pt has not been compliant w/HEP yet and reviewed quadruped PWR moves on 8/4. Pt reports he often sleeps on the couch due to falling asleep there at night and cannot perform his PWR moves before he gets up as advised by OT due to lack of space. Pt reports interest in rejoining gym at end of POC. Continue POC.    OBJECTIVE IMPAIRMENTS: Abnormal gait, decreased activity tolerance, decreased balance, decreased cognition, decreased coordination, decreased knowledge of use of DME, difficulty walking, decreased strength, decreased safety awareness, impaired perceived functional ability, improper body mechanics, postural dysfunction, and pain  ACTIVITY LIMITATIONS: carrying, lifting, squatting, stairs, locomotion level, and caring for others  PARTICIPATION LIMITATIONS: driving, shopping, community activity, and yard work  PERSONAL FACTORS: Fitness and 1 comorbidity: PD are also affecting patient's functional outcome.   REHAB POTENTIAL: Good  CLINICAL DECISION MAKING: Evolving/moderate complexity  EVALUATION COMPLEXITY: Moderate  PLAN:  PT FREQUENCY: 2x/week  PT DURATION: 4 weeks + 4 weeks (recert)   PLANNED INTERVENTIONS: 02835- PT Re-evaluation, 97750- Physical Performance Testing,  97110-Therapeutic exercises, 97530- Therapeutic activity, [id number]- Neuromuscular re-education, 97535- Self Care, 02859- Manual therapy, [id number]- Gait training, [id number]- Canalith repositioning, [id number]- Aquatic Therapy, [phone] (1-2 muscles), 20561 (3+ muscles)- Dry Needling, Patient/Family education, Balance training, Stair training, Joint mobilization, Spinal mobilization, Vestibular training, and DME instructions  PLAN FOR NEXT SESSION: How is getting out of bathtub? Tall kneel activities, RDLs or DL progression. Work on stepping strategies, lateral weight shifting, sitting from low surfaces    Avyay Coger E Perfecto Purdy, PT, DPT 05/12/2024, 11:50 AM

## 2024-05-12 NOTE — Patient Instructions (Addendum)
 Go on [employer] Our Program  Click [facility] Watch daily lesson at 11 AM OR Scroll down to watch recorded sessions

## 2024-05-12 NOTE — Therapy (Signed)
 OUTPATIENT OCCUPATIONAL THERAPY PARKINSON'S TREATMENT  Patient Name: Joseph Lamb MRN: [id number] DOB:Feb 09, 1951, 73 y.o., male Today's Date: 05/12/2024  PCP: Loreli Kins, MD REFERRING PROVIDER: Evonnie Asberry RAMAN, DO  END OF SESSION:  OT End of Session - 05/12/24 1152     Visit Number 5    Number of Visits 17    Date for OT Re-Evaluation 06/14/24    Authorization Type UHC MCR - Auth required    OT Start Time 1151    OT Stop Time 1230    OT Time Calculation (min) 39 min    Activity Tolerance Patient tolerated treatment well    Behavior During Therapy [facility] for tasks assessed/performed           Past Medical History:  Diagnosis Date   Arrhythmia    Atrial fibrillation 10/11/2021   Cerebrovascular disease    moderate per 2021 MRI   Cholecystitis 09/01/2021   Chronic kidney disease, stage 3 unspecified 10/11/2021   Dizzy spells 03/08/2020   Erectile dysfunction 10/11/2021   Essential hypertension 06/14/2015   Essential tremor    Glaucoma 08/31/2021   Hyperlipidemia 06/14/2015   Irregular heart beat    Mild cognitive impairment of uncertain or unknown etiology 05/21/2023   Mixed hyperlipidemia    Multiple lacunar infarcts    bilateral centrum semiovale and right parietal cortex.   Obesity 10/11/2021   Obstructive sleep apnea    no CPAP   Other male erectile dysfunction    Primary open angle glaucoma (POAG) of left eye, moderate stage 10/11/2021   Primary open-angle glaucoma, right eye, mild stage 10/11/2021   Renal insufficiency 09/06/2022   Sinus bradycardia 03/08/2020   Tremor 10/11/2021   Type 2 diabetes mellitus with other specified complication (HCC)    Type II diabetes mellitus 10/11/2021   Vascular dementia [facility])    Past Surgical History:  Procedure Laterality Date   CHOLECYSTECTOMY N/A 08/31/2021   Procedure: LAPAROSCOPIC CHOLECYSTECTOMY;  Surgeon: Lyndel Deward PARAS, MD;  Location: [facility] OR;  Service: General;  Laterality: N/A;   EYE SURGERY      Glaucoma surgery   PROSTATE SURGERY     Patient Active Problem List   Diagnosis Date Noted   Essential tremor    Mixed hyperlipidemia    Irregular heart beat    Other male erectile dysfunction    Type 2 diabetes mellitus with other specified complication (HCC)    Vascular dementia (HCC)    Mild cognitive impairment of uncertain or unknown etiology 05/21/2023   Obstructive sleep apnea    Multiple lacunar infarcts    Cerebrovascular disease    Renal insufficiency 09/06/2022   Atrial fibrillation 10/11/2021   Chronic kidney disease, stage 3 unspecified 10/11/2021   Obesity 10/11/2021   Erectile dysfunction 10/11/2021   Primary open angle glaucoma (POAG) of left eye, moderate stage 10/11/2021   Primary open-angle glaucoma, right eye, mild stage 10/11/2021   Tremor 10/11/2021   Type II diabetes mellitus 10/11/2021   Cholecystitis 09/01/2021   Glaucoma 08/31/2021   Arrhythmia    Dizzy spells 03/08/2020   Sinus bradycardia 03/08/2020   Essential hypertension 06/14/2015   Hyperlipidemia 06/14/2015    ONSET DATE: 02/17/2024 (referral date)   REFERRING DIAG: G20.A1 (ICD-10-CM) - Parkinson's disease without dyskinesia or fluctuating manifestations (HCC)  THERAPY DIAG:  Other lack of coordination  Muscle weakness (generalized)  Abnormal posture  Other symptoms and signs involving the musculoskeletal system  Other symptoms and signs involving the nervous system  Tremors of nervous system  Rationale for Evaluation and Treatment: Rehabilitation  SUBJECTIVE:   SUBJECTIVE STATEMENT: He brought in his binder again today. He feels he was not warmed up prior to task initiation but then reported fatigue in midst of task.   Pt accompanied by: self  PERTINENT HISTORY: PMH: PD (diagnosed 2024), A fb, stage 3 CKD, HLD, HTN, multiple lacunar infarcts, obesity, type II diabetes   PRECAUTIONS: Fall  WEIGHT BEARING RESTRICTIONS: No  PAIN:  Are you having pain? No except sometimes  low back pain  FALLS: Has patient fallen in last 6 months? No  LIVING ENVIRONMENT: Lives with: lives with their spouse and lives with their son Lives in: 1 story home Stairs: Yes: External: 3 steps; bilateral but cannot reach both Has following equipment at home: shower chair and Grab bars  PLOF: Independent with basic ADLs  PATIENT GOALS: just go with the flow  OBJECTIVE:  Note: Objective measures were completed at Evaluation unless otherwise noted.  HAND DOMINANCE: Right  ADLs: Eating: mod I  - pt reports occasional tremors Grooming: mod I  UB Dressing: wife does buttons on dress shirt LB Dressing: mod I - slower  Toileting: mod I  Bathing: mod I (getting down in tub for bathing) Tub Shower transfers: mod I  Equipment: Grab bars  IADLs: Shopping: wife mostly does, but pt will sometimes accompany her. Pt will occasionally go to get smaller purchases Light housekeeping: pt does some laundry and washing dishes, sweeping Meal Prep: wife mostly does (pt every once in awhile does breakfast)  Community mobility: pt drives short distances (avoids highway), wife drives mostly Medication management: pt doing Landscape architect: wife does Handwriting: 100% legible and Mild micrographia w/ decline in legibility if writing longer  MOBILITY STATUS: Independent   FUNCTIONAL OUTCOME MEASURES: Fastening/unfastening 3 buttons: 2 min. 21 sec (w/o glasses and reports difficulty seeing buttons) Physical performance test: PPT#2 (simulated eating) 12.95 sec (holding spoon at end)  & PPT#4 (donning/doffing jacket): 20.22 sec (w/ hospital gown)   04/28/24: fastening/unfastening 3 buttons: 54 sec w/ glasses on  COORDINATION: 9 Hole Peg test: Right: 38.28 sec; Left: 47.56 sec Box and Blocks:  Right 47 blocks, Left 45 blocks Tremors: Resting and Right  UE ROM:  WFL   SENSATION: Pt reports diminished RUE   COGNITION: Overall cognitive status: Impaired and decreased processing,  however pt more alert and participatory for today's evaluation  OBSERVATIONS: Bradykinesia, Hypokinesia, and Rt resting tremors, decreased vision d/t glaucoma                                                                                                                    TREATMENT:  - Therapeutic exercises completed for duration as noted below including:  Reviewed PWR! Sitting with max cueing to move big and activate entire body - especially with PWR! Rock, Twist, and Step - also required cues for sequencing latter ex. Pt demo each x 20 reps. Then went back and reviewed each ex again approx 6 times. Pt instructed to perform EVERY  morning before getting out of bed  - Self-care/home management completed for duration as noted below including:  OT reviewed jacket donning and doffing requiring increased time and max cueing for completion. Task stopped due to poor activity tolerance.   - Therapeutic activities completed for duration as noted below including:  OT educated pt on table top play of Golf Solitaire for RUE and LUE to address fine motor coordination, gross motor coordination, upper extremity range of motion, reaction time, scanning and locating of items, processing, bimanual coordination/trunk control, sequencing of unfamiliar motor movements or tasks, motor planning, item/pattern recognition, and endurance/stamina. Pt required minimal cues for proper play.    PATIENT EDUCATION: Education details: see above Person educated: Patient Education method: Explanation, Demonstration, Tactile cues, Verbal cues, and Handouts Education comprehension: verbalized understanding, returned demonstration, verbal cues required, tactile cues required, and needs further education  HOME EXERCISE PROGRAM: 05/12/2024: golf solitaire  GOALS: Goals reviewed with patient? Yes  SHORT TERM GOALS: Target date: 05/14/24  Independent with PD specific HEP  (review and update prn)  Baseline: Goal status: IN  PROGRESS  2.  Pt will verbalize understanding of adaptive strategies to increase ease with ADLS/IADLS   Baseline:  Goal status: INITIAL  3.  Pt will demonstrate increased ease with dressing as evidenced by decreasing PPT#4 (don/ doff jacket) to 15 secs or less  Baseline: 20.22 sec w/ hospital gown Goal status: INITIAL  4.  Pt will demonstrate improved ease with fastening buttons as evidenced by decreasing 3 button/unbutton time by 30 seconds or more wearing his glasses Baseline: 2 min and 21 sec (w/o glasses), 1:43 at screen 02/17/24 04/28/24: 54 sec w/ glasses Goal status: MET, and revised to performing in less than 50 sec.   5.  Pt will write a paragraph with no significant decrease in size and maintain 100% legibility  Baseline:  Goal status: INITIAL   LONG TERM GOALS: Target date: 06/14/24  Pt will verbalize understanding of ways to prevent future PD related complications and appropriate community resources prn  Baseline:  Goal status: INITIAL  2.  Pt will verbalize understanding of ways to keep thinking skills sharp and ways to compensate for STM changes  Baseline:  Goal status: INITIAL  3.  Pt will demonstrate improved fine motor coordination for ADLs as evidenced by decreasing 9 hole peg test score for Rt hand by 4 secs  Baseline: 38.28 sec Goal status: INITIAL  4.  Pt will demonstrate improved fine motor coordination for ADLs as evidenced by decreasing 9 hole peg test score for Lt hand by 7 secs or more Baseline: 47.56 sec Goal status: INITIAL  5.  Pt will demonstrate improved ease with fastening buttons as evidenced by decreasing 3 button/unbutton time by 40 seconds with glasses Baseline: 2 min, 21 sec  Goal status: INITIAL  ASSESSMENT:  CLINICAL IMPRESSION: Patient required max cueing and review this date for PWR! Sitting as well as for donning strategies for jacket. Limited by poor activity tolerance suggesting he is not carrying over HEP outside of clinic. He  responded well to [employer]. Recommend review at next session given this seems to be an activity he will carryover.   PERFORMANCE DEFICITS: in functional skills including ADLs, IADLs, coordination, dexterity, sensation, tone, ROM, strength, Fine motor control, Gross motor control, mobility, balance, body mechanics, and UE functional use, cognitive skills including problem solving and safety awareness, and psychosocial skills including coping strategies.   IMPAIRMENTS: are limiting patient from ADLs, IADLs, rest and sleep,  leisure, and social participation.   COMORBIDITIES:  may have co-morbidities  that affects occupational performance. Patient will benefit from skilled OT to address above impairments and improve overall function.  REHAB POTENTIAL: Good  PLAN:  OT FREQUENCY: 2x/week  OT DURATION: 8 weeks  PLANNED INTERVENTIONS: 97535 self care/ADL training, 02889 therapeutic exercise, 97530 therapeutic activity, 97112 neuromuscular re-education, 97140 manual therapy, 97113 aquatic therapy, 97039 fluidotherapy, 97760 Orthotic Initial, 97763 Orthotic/Prosthetic subsequent, passive range of motion, functional mobility training, visual/perceptual remediation/compensation, coping strategies training, patient/family education, and DME and/or AE instructions  RECOMMENDED OTHER SERVICES: none at this time  CONSULTED AND AGREED WITH PLAN OF CARE: Patient  PLAN FOR NEXT SESSION:  Review Jacket first so pt isn't tired; review golf solitaire (he did well); review PWR! All 4's w/ modifications prn   Jocelyn CHRISTELLA Bottom, OT 05/12/2024, 11:53 AM

## 2024-05-17 ENCOUNTER — Ambulatory Visit: Admitting: Occupational Therapy

## 2024-05-17 ENCOUNTER — Ambulatory Visit: Admitting: Physical Therapy

## 2024-05-17 ENCOUNTER — Encounter: Payer: Self-pay | Admitting: Occupational Therapy

## 2024-05-17 VITALS — BP 138/68 | HR 75

## 2024-05-17 DIAGNOSIS — R278 Other lack of coordination: Secondary | ICD-10-CM

## 2024-05-17 DIAGNOSIS — R2681 Unsteadiness on feet: Secondary | ICD-10-CM

## 2024-05-17 DIAGNOSIS — R2689 Other abnormalities of gait and mobility: Secondary | ICD-10-CM

## 2024-05-17 DIAGNOSIS — R29898 Other symptoms and signs involving the musculoskeletal system: Secondary | ICD-10-CM

## 2024-05-17 DIAGNOSIS — R29818 Other symptoms and signs involving the nervous system: Secondary | ICD-10-CM

## 2024-05-17 DIAGNOSIS — R41840 Attention and concentration deficit: Secondary | ICD-10-CM

## 2024-05-17 DIAGNOSIS — M6281 Muscle weakness (generalized): Secondary | ICD-10-CM

## 2024-05-17 NOTE — Therapy (Signed)
 OUTPATIENT OCCUPATIONAL THERAPY [facility]  Patient Name: Joseph Lamb MRN: [id number] DOB:04-08-1951, 73 y.o., male Today's Date: 05/17/2024  PCP: Loreli Kins, MD REFERRING PROVIDER: Evonnie Asberry RAMAN, DO  END OF SESSION:  OT End of Session - 05/17/24 1101     Visit Number 6    Number of Visits 17    Date for OT Re-Evaluation 06/14/24    Authorization Type UHC MCR - Auth required    OT Start Time 1100    OT Stop Time 1145    OT Time Calculation (min) 45 min    Activity Tolerance Patient tolerated treatment well    Behavior During Therapy [facility] for tasks assessed/performed           Past Medical History:  Diagnosis Date   Arrhythmia    Atrial fibrillation 10/11/2021   Cerebrovascular disease    moderate per 2021 MRI   Cholecystitis 09/01/2021   Chronic kidney disease, stage 3 unspecified 10/11/2021   Dizzy spells 03/08/2020   Erectile dysfunction 10/11/2021   Essential hypertension 06/14/2015   Essential tremor    Glaucoma 08/31/2021   Hyperlipidemia 06/14/2015   Irregular heart beat    Mild cognitive impairment of uncertain or unknown etiology 05/21/2023   Mixed hyperlipidemia    Multiple lacunar infarcts    bilateral centrum semiovale and right parietal cortex.   Obesity 10/11/2021   Obstructive sleep apnea    no CPAP   Other male erectile dysfunction    Primary open angle glaucoma (POAG) of left eye, moderate stage 10/11/2021   Primary open-angle glaucoma, right eye, mild stage 10/11/2021   Renal insufficiency 09/06/2022   Sinus bradycardia 03/08/2020   Tremor 10/11/2021   Type 2 diabetes mellitus with other specified complication (HCC)    Type II diabetes mellitus 10/11/2021   Vascular dementia [facility])    Past Surgical History:  Procedure Laterality Date   CHOLECYSTECTOMY N/A 08/31/2021   Procedure: LAPAROSCOPIC CHOLECYSTECTOMY;  Surgeon: Lyndel Deward PARAS, MD;  Location: [facility] OR;  Service: General;  Laterality: N/A;   EYE SURGERY      Glaucoma surgery   PROSTATE SURGERY     Patient Active Problem List   Diagnosis Date Noted   Essential tremor    Mixed hyperlipidemia    Irregular heart beat    Other male erectile dysfunction    Type 2 diabetes mellitus with other specified complication (HCC)    Vascular dementia (HCC)    Mild cognitive impairment of uncertain or unknown etiology 05/21/2023   Obstructive sleep apnea    Multiple lacunar infarcts    Cerebrovascular disease    Renal insufficiency 09/06/2022   Atrial fibrillation 10/11/2021   Chronic kidney disease, stage 3 unspecified 10/11/2021   Obesity 10/11/2021   Erectile dysfunction 10/11/2021   Primary open angle glaucoma (POAG) of left eye, moderate stage 10/11/2021   Primary open-angle glaucoma, right eye, mild stage 10/11/2021   Tremor 10/11/2021   Type II diabetes mellitus 10/11/2021   Cholecystitis 09/01/2021   Glaucoma 08/31/2021   Arrhythmia    Dizzy spells 03/08/2020   Sinus bradycardia 03/08/2020   Essential hypertension 06/14/2015   Hyperlipidemia 06/14/2015    ONSET DATE: 02/17/2024 (referral date)   REFERRING DIAG: G20.A1 (ICD-10-CM) - Parkinson's disease without dyskinesia or fluctuating manifestations (HCC)  THERAPY DIAG:  Other symptoms and signs involving the nervous system  Other lack of coordination  Other symptoms and signs involving the musculoskeletal system  Unsteadiness on feet  Attention and concentration deficit  Rationale for  Evaluation and Treatment: Rehabilitation  SUBJECTIVE:   SUBJECTIVE STATEMENT: No pain and no falls.    Pt accompanied by: self  PERTINENT HISTORY: PMH: PD (diagnosed 2024), A fb, stage 3 CKD, HLD, HTN, multiple lacunar infarcts, obesity, type II diabetes   PRECAUTIONS: Fall  WEIGHT BEARING RESTRICTIONS: No  PAIN:  Are you having pain? No except sometimes low back pain  FALLS: Has patient fallen in last 6 months? No  LIVING ENVIRONMENT: Lives with: lives with their spouse and  lives with their son Lives in: 1 story home Stairs: Yes: External: 3 steps; bilateral but cannot reach both Has following equipment at home: shower chair and Grab bars  PLOF: Independent with basic ADLs  PATIENT GOALS: just go with the flow  OBJECTIVE:  Note: Objective measures were completed at Evaluation unless otherwise noted.  HAND DOMINANCE: Right  ADLs: Eating: mod I  - pt reports occasional tremors Grooming: mod I  UB Dressing: wife does buttons on dress shirt LB Dressing: mod I - slower  Toileting: mod I  Bathing: mod I (getting down in tub for bathing) Tub Shower transfers: mod I  Equipment: Grab bars  IADLs: Shopping: wife mostly does, but pt will sometimes accompany her. Pt will occasionally go to get smaller purchases Light housekeeping: pt does some laundry and washing dishes, sweeping Meal Prep: wife mostly does (pt every once in awhile does breakfast)  Community mobility: pt drives short distances (avoids highway), wife drives mostly Medication management: pt doing Landscape architect: wife does Handwriting: 100% legible and Mild micrographia w/ decline in legibility if writing longer  MOBILITY STATUS: Independent   FUNCTIONAL OUTCOME MEASURES: Fastening/unfastening 3 buttons: 2 min. 21 sec (w/o glasses and reports difficulty seeing buttons) Physical performance test: PPT#2 (simulated eating) 12.95 sec (holding spoon at end)  & PPT#4 (donning/doffing jacket): 20.22 sec (w/ hospital gown)   04/28/24: fastening/unfastening 3 buttons: 54 sec w/ glasses on  COORDINATION: 9 Hole Peg test: Right: 38.28 sec; Left: 47.56 sec Box and Blocks:  Right 47 blocks, Left 45 blocks Tremors: Resting and Right  UE ROM:  WFL   SENSATION: Pt reports diminished RUE   COGNITION: Overall cognitive status: Impaired and decreased processing, however pt more alert and participatory for today's evaluation  OBSERVATIONS: Bradykinesia, Hypokinesia, and Rt resting tremors,  decreased vision d/t glaucoma                                                                                                                    TREATMENT:  Practiced donning and doffing jacket (pt did not bring his own so used clinic jacket) with cues for strategies, big movements.   Reviewed PWR! All 4's x 10 each with cues for posture, activating entire body, big movements throughout, and for head/eye boosts  Reviewed PWR! Seated x 5 each (except did not have time to review PWR! Step) w/ cues t/o for large amplitude movements and head/eye boosts especially with PWR! Rock  PATIENT EDUCATION: Education details: see above Person educated: Patient  Education method: Explanation, Demonstration, Tactile cues, Verbal cues, and Handouts Education comprehension: verbalized understanding, returned demonstration, verbal cues required, tactile cues required, and needs further education  HOME EXERCISE PROGRAM: 05/12/2024: golf solitaire  GOALS: Goals reviewed with patient? Yes  SHORT TERM GOALS: Target date: 05/14/24  Independent with PD specific HEP  (review and update prn)  Baseline: Goal status: IN PROGRESS  2.  Pt will verbalize understanding of adaptive strategies to increase ease with ADLS/IADLS   Baseline:  Goal status: INITIAL  3.  Pt will demonstrate increased ease with dressing as evidenced by decreasing PPT#4 (don/ doff jacket) to 15 secs or less  Baseline: 20.22 sec w/ hospital gown Goal status: INITIAL  4.  Pt will demonstrate improved ease with fastening buttons as evidenced by decreasing 3 button/unbutton time by 30 seconds or more wearing his glasses Baseline: 2 min and 21 sec (w/o glasses), 1:43 at screen 02/17/24 04/28/24: 54 sec w/ glasses Goal status: MET, and revised to performing in less than 50 sec.   5.  Pt will write a paragraph with no significant decrease in size and maintain 100% legibility  Baseline:  Goal status: INITIAL   LONG TERM GOALS: Target date:  06/14/24  Pt will verbalize understanding of ways to prevent future PD related complications and appropriate community resources prn  Baseline:  Goal status: INITIAL  2.  Pt will verbalize understanding of ways to keep thinking skills sharp and ways to compensate for STM changes  Baseline:  Goal status: INITIAL  3.  Pt will demonstrate improved fine motor coordination for ADLs as evidenced by decreasing 9 hole peg test score for Rt hand by 4 secs  Baseline: 38.28 sec Goal status: INITIAL  4.  Pt will demonstrate improved fine motor coordination for ADLs as evidenced by decreasing 9 hole peg test score for Lt hand by 7 secs or more Baseline: 47.56 sec Goal status: INITIAL  5.  Pt will demonstrate improved ease with fastening buttons as evidenced by decreasing 3 button/unbutton time by 40 seconds with glasses Baseline: 2 min, 21 sec  Goal status: INITIAL  ASSESSMENT:  CLINICAL IMPRESSION: Patient required max cueing and review this date for PWR! Sitting and PWR! All 4's, as well as for donning strategies for jacket. Limited by poor activity tolerance suggesting he is not carrying over HEP outside of clinic.   PERFORMANCE DEFICITS: in functional skills including ADLs, IADLs, coordination, dexterity, sensation, tone, ROM, strength, Fine motor control, Gross motor control, mobility, balance, body mechanics, and UE functional use, cognitive skills including problem solving and safety awareness, and psychosocial skills including coping strategies.   IMPAIRMENTS: are limiting patient from ADLs, IADLs, rest and sleep, leisure, and social participation.   COMORBIDITIES:  may have co-morbidities  that affects occupational performance. Patient will benefit from skilled OT to address above impairments and improve overall function.  REHAB POTENTIAL: Good  PLAN:  OT FREQUENCY: 2x/week  OT DURATION: 8 weeks  PLANNED INTERVENTIONS: 97535 self care/ADL training, 02889 therapeutic exercise, 97530  therapeutic activity, 97112 neuromuscular re-education, 97140 manual therapy, [id number] aquatic therapy, 97039 fluidotherapy, 97760 Orthotic Initial, [id number] Orthotic/Prosthetic subsequent, passive range of motion, functional mobility training, visual/perceptual remediation/compensation, coping strategies training, patient/family education, and DME and/or AE instructions  RECOMMENDED OTHER SERVICES: none at this time  CONSULTED AND AGREED WITH PLAN OF CARE: Patient  PLAN FOR NEXT SESSION:   review golf solitaire (he did well); review PWR! Step Sitting, continue to reinforce PWR! Moves, issue cape method handout for jacket  Burnard JINNY Roads, OT 05/17/2024, 11:04 AM

## 2024-05-17 NOTE — Therapy (Signed)
 OUTPATIENT PHYSICAL THERAPY NEURO TREATMENT    Patient Name: Joseph Lamb MRN: [id number] DOB:1951/06/27, 73 y.o., male Today's Date: 05/17/2024   PCP: Loreli Kins, MD REFERRING PROVIDER: Tat, Asberry RAMAN, DO  END OF SESSION:  PT End of Session - 05/17/24 1019     Visit Number 5    Number of Visits 9   plus eval   Date for PT Re-Evaluation 06/09/24   Recert to cover remaining visits   Authorization Type U[employer] Medicare    PT Start Time 1017    PT Stop Time 1101    PT Time Calculation (min) 44 min    Equipment Utilized During Treatment Gait belt    Activity Tolerance Patient tolerated treatment well    Behavior During Therapy [facility] for tasks assessed/performed;Flat affect            Past Medical History:  Diagnosis Date   Arrhythmia    Atrial fibrillation 10/11/2021   Cerebrovascular disease    moderate per 2021 MRI   Cholecystitis 09/01/2021   Chronic kidney disease, stage 3 unspecified 10/11/2021   Dizzy spells 03/08/2020   Erectile dysfunction 10/11/2021   Essential hypertension 06/14/2015   Essential tremor    Glaucoma 08/31/2021   Hyperlipidemia 06/14/2015   Irregular heart beat    Mild cognitive impairment of uncertain or unknown etiology 05/21/2023   Mixed hyperlipidemia    Multiple lacunar infarcts    bilateral centrum semiovale and right parietal cortex.   Obesity 10/11/2021   Obstructive sleep apnea    no CPAP   Other male erectile dysfunction    Primary open angle glaucoma (POAG) of left eye, moderate stage 10/11/2021   Primary open-angle glaucoma, right eye, mild stage 10/11/2021   Renal insufficiency 09/06/2022   Sinus bradycardia 03/08/2020   Tremor 10/11/2021   Type 2 diabetes mellitus with other specified complication (HCC)    Type II diabetes mellitus 10/11/2021   Vascular dementia [facility])    Past Surgical History:  Procedure Laterality Date   CHOLECYSTECTOMY N/A 08/31/2021   Procedure: LAPAROSCOPIC CHOLECYSTECTOMY;  Surgeon:  Lyndel Deward PARAS, MD;  Location: [facility] OR;  Service: General;  Laterality: N/A;   EYE SURGERY     Glaucoma surgery   PROSTATE SURGERY     Patient Active Problem List   Diagnosis Date Noted   Essential tremor    Mixed hyperlipidemia    Irregular heart beat    Other male erectile dysfunction    Type 2 diabetes mellitus with other specified complication (HCC)    Vascular dementia (HCC)    Mild cognitive impairment of uncertain or unknown etiology 05/21/2023   Obstructive sleep apnea    Multiple lacunar infarcts    Cerebrovascular disease    Renal insufficiency 09/06/2022   Atrial fibrillation 10/11/2021   Chronic kidney disease, stage 3 unspecified 10/11/2021   Obesity 10/11/2021   Erectile dysfunction 10/11/2021   Primary open angle glaucoma (POAG) of left eye, moderate stage 10/11/2021   Primary open-angle glaucoma, right eye, mild stage 10/11/2021   Tremor 10/11/2021   Type II diabetes mellitus 10/11/2021   Cholecystitis 09/01/2021   Glaucoma 08/31/2021   Arrhythmia    Dizzy spells 03/08/2020   Sinus bradycardia 03/08/2020   Essential hypertension 06/14/2015   Hyperlipidemia 06/14/2015    ONSET DATE: 02/17/2024 (referral)   REFERRING DIAG: G20.A1 (ICD-10-CM) - Parkinson's disease without dyskinesia or fluctuating manifestations (HCC)  THERAPY DIAG:  Unsteadiness on feet  Other abnormalities of gait and mobility  Muscle weakness (generalized)  Other lack of coordination  Rationale for Evaluation and Treatment: Rehabilitation  SUBJECTIVE:                                                                                                                                                                                             SUBJECTIVE STATEMENT: Pt presents without AD. States he feels good today. Getting out of the bathtub is still a struggle but it is better than it was. Having some stomach pain today, rating it as a 3/10. States this has been going on since his  gallbladder surgery. No falls but has had a few stumbles, mostly when getting up first thing in the morning.     Pt accompanied by: self (wife in the car)   PERTINENT HISTORY: PD, A fb, stage 3 CKD, HLD, HTN, multiple lacunar infarcts, obesity, type II diabetes  PAIN:  Are you having pain? Yes: NPRS scale: 3/10 Pain location: Upper R quadrant of stomach Pain description: Burning  PRECAUTIONS: Fall  RED FLAGS: None   WEIGHT BEARING RESTRICTIONS: No  FALLS: Has patient fallen in last 6 months? No  LIVING ENVIRONMENT: Lives with: lives with their spouse Lives in: House/apartment Stairs: Yes: External: 2 in front, 8 in back steps; pt cannot recall if he has rails Has following equipment at home: shower chair and Grab bars  PLOF: Independent  PATIENT GOALS: I need to reengage the physical part of this therapy on a regular basis. I have been kind of lax   OBJECTIVE:  Note: Objective measures were completed at Evaluation unless otherwise noted.  DIAGNOSTIC FINDINGS: None relevant   COGNITION: Overall cognitive status: History of cognitive impairments - at baseline   SENSATION: Pt denies significant numbness/tingling in all extremities   EDEMA: None currently    MUSCLE TONE: RLE: Rigidity   POSTURE: rounded shoulders, forward head, increased thoracic kyphosis, weight shift right, and tremor in RUE   LOWER EXTREMITY ROM:     Active  Right Eval Left Eval  Hip flexion    Hip extension    Hip abduction    Hip adduction    Hip internal rotation    Hip external rotation    Knee flexion    Knee extension    Ankle dorsiflexion    Ankle plantarflexion    Ankle inversion    Ankle eversion     (Blank rows = not tested)  LOWER EXTREMITY MMT:    MMT Right Eval Left Eval  Hip flexion    Hip extension    Hip abduction    Hip adduction    Hip internal rotation  Hip external rotation    Knee flexion    Knee extension    Ankle dorsiflexion    Ankle  plantarflexion    Ankle inversion    Ankle eversion    (Blank rows = not tested)  BED MOBILITY:  Not tested Pt reports he frequently sleeps on the couch. Is slow getting into/out of the bed   TRANSFERS: Sit to stand: Modified independence  Assistive device utilized: None     Stand to sit: Modified independence  Assistive device utilized: None      RAMP:  Not tested  CURB:  Not tested  STAIRS: Not tested GAIT: Gait pattern: step through pattern, decreased arm swing- Right, decreased stride length, decreased hip/knee flexion- Right, decreased hip/knee flexion- Left, shuffling, lateral hip instability, decreased trunk rotation, trunk flexed, poor foot clearance- Right, and poor foot clearance- Left Distance walked: Various clinic distances  Assistive device utilized: None Level of assistance: Modified independence Comments: Lateral deviations noted when performing sit > stand> walk but otherwise no instability noted    FUNCTIONAL TESTS:       VITALS  Vitals:   05/17/24 1035  BP: 138/68  Pulse: 75                                                                                                    TREATMENT:   Therapeutic Activity/Self-care/NMR Recommended pt prioritize sleeping in his bed at night rather than the couch so he can perform his supine PWR moves prior to getting up, as he frequently loses his balance when he first stands up in the AM. Pt in agreement with this. Educated pt on role of BP, HR and PD on dizziness and difficulty moving first thing in the morning. Strongly encouraged pt perform some type of exercise before standing first thing in the morning to increase BP and prime himself for movement.  Assessed vitals (see above) and BP would not read on initial 3 tries, but did read on final try and was WNL. Pt reported feeling more weak than usual, but denied dizziness or lightheadedness.  SciFit multi-peaks level 14.0 for 8 minutes using BUE/BLEs for neural  priming for reciprocal movement, dynamic cardiovascular warmup and increased amplitude of stepping. RPE of 4-5/10 following activity  6 Blaze pods on random reach setting for improved single leg stability, LE coordination, increased amplitude of stepping and hip flexor strength.  Performed on 2.5 minute intervals with 2 minute rest periods.  Pt requires CGA guarding. Round 1:  3 pods placed on first step and 3 pods placed on second step setup.  78 hits. Round 2:  same setup.  73 hits. Notable errors/deficits:  decreased step clearance w/RLE > LLE. RPE of 7/10 following activity.    PATIENT EDUCATION: Education details: Importance of trying to go to sleep in bed rather than the couch in order to work on PWR moves prior to standing first thing in the AM, continue HEP Person educated: Patient Education method: Programmer, multimedia, Demonstration, and Verbal cues Education comprehension: verbalized understanding, returned demonstration, verbal cues required, and needs further education  HOME EXERCISE  PROGRAM: To be reviewed from previous POC:  [id number], standing/quadruped PWR moves, fwd/retro stepping  (Reviewed all except quadruped PWR moves on 7/23)  GOALS: Goals reviewed with patient? Yes  STG = LTG DUE TO POC LENGTH    LONG TERM GOALS: Target date: 05/11/2024   Pt will be independent with PD-specific HEP for improved strength, balance, transfers and gait.  Baseline: not performing regularly  Goal status: IN PROGRESS   2.  Pt will score >/= 23/28 on MiniBest for improved stepping strategies and reduced fall risk  Baseline: 20/28 Goal status: IN PROGRESS   3.  Pt will perform 5 sit to stands from 14 height w/no UE support for improved functional BLE strength and transfers  Baseline: reports difficulty standing from the couch  Goal status: MET  4.  Pt will perform floor transfer mod I for improved functional BLE strength and transferring into/out of bathtub.  Baseline:  Goal status: MET    NEW LONG TERM GOALS FOR EXTENDED POC: Target date: 06/09/2024   Pt will be independent with PD-specific HEP for improved strength, balance, transfers and gait.  Baseline: not performing regularly  Goal status: IN PROGRESS  2.  Pt will score >/= 23/28 on MiniBest for improved stepping strategies and reduced fall risk  Baseline: 20/28 Goal status: IN PROGRESS    ASSESSMENT:  CLINICAL IMPRESSION: Emphasis of skilled PT session on pt education, increased amplitude of stepping, LE coordination and single leg stability. Pt reports he still has difficulty getting out of bathtub but it is easier now if he implements the technique he practiced in PT last session. Pt states he is most unstable first thing in the morning and cannot perform his PWR moves due to frequently sleeping on the couch. Recommended pt prioritize sleeping in his bed so he can work on [employer] moves prior to standing up and assess if this assists w/balance. Pt very challenged by blaze pod activity due to decreased endurance and decreased step clearance w/RLE, but did improve on second round. Continue POC.    OBJECTIVE IMPAIRMENTS: Abnormal gait, decreased activity tolerance, decreased balance, decreased cognition, decreased coordination, decreased knowledge of use of DME, difficulty walking, decreased strength, decreased safety awareness, impaired perceived functional ability, improper body mechanics, postural dysfunction, and pain  ACTIVITY LIMITATIONS: carrying, lifting, squatting, stairs, locomotion level, and caring for others  PARTICIPATION LIMITATIONS: driving, shopping, community activity, and yard work  PERSONAL FACTORS: Fitness and 1 comorbidity: PD are also affecting patient's functional outcome.   REHAB POTENTIAL: Good  CLINICAL DECISION MAKING: Evolving/moderate complexity  EVALUATION COMPLEXITY: Moderate  PLAN:  PT FREQUENCY: 2x/week  PT DURATION: 4 weeks + 4 weeks (recert)   PLANNED INTERVENTIONS: 02835-  PT Re-evaluation, 97750- Physical Performance Testing, 97110-Therapeutic exercises, 97530- Therapeutic activity, [id number]- Neuromuscular re-education, 97535- Self Care, 02859- Manual therapy, [id number]- Gait training, [phone]- Canalith repositioning, [id number]- Aquatic Therapy, [phone] (1-2 muscles), 20561 (3+ muscles)- Dry Needling, Patient/Family education, Balance training, Stair training, Joint mobilization, Spinal mobilization, Vestibular training, and DME instructions  PLAN FOR NEXT SESSION: How is getting out of bathtub? Tall kneel activities, RDLs or DL progression. Work on stepping strategies, lateral weight shifting, sitting from low surfaces    Shelisa Fern E Sheridan Hew, PT, DPT 05/17/2024, 11:39 AM

## 2024-05-19 ENCOUNTER — Ambulatory Visit: Admitting: Occupational Therapy

## 2024-05-19 ENCOUNTER — Ambulatory Visit: Admitting: Physical Therapy

## 2024-05-19 VITALS — BP 102/67 | HR 78

## 2024-05-19 DIAGNOSIS — R2681 Unsteadiness on feet: Secondary | ICD-10-CM

## 2024-05-19 DIAGNOSIS — R278 Other lack of coordination: Secondary | ICD-10-CM

## 2024-05-19 DIAGNOSIS — R29818 Other symptoms and signs involving the nervous system: Secondary | ICD-10-CM

## 2024-05-19 DIAGNOSIS — R29898 Other symptoms and signs involving the musculoskeletal system: Secondary | ICD-10-CM

## 2024-05-19 DIAGNOSIS — R2689 Other abnormalities of gait and mobility: Secondary | ICD-10-CM

## 2024-05-19 DIAGNOSIS — M6281 Muscle weakness (generalized): Secondary | ICD-10-CM

## 2024-05-19 DIAGNOSIS — R41840 Attention and concentration deficit: Secondary | ICD-10-CM

## 2024-05-19 NOTE — Therapy (Signed)
 OUTPATIENT OCCUPATIONAL THERAPY [facility]  Patient Name: Joseph Lamb MRN: [id number] DOB:30-Aug-1951, 73 y.o., male Today's Date: 05/19/2024  PCP: Loreli Kins, MD REFERRING PROVIDER: Evonnie Asberry RAMAN, DO  END OF SESSION:  OT End of Session - 05/19/24 1139     Visit Number 7    Number of Visits 17    Date for OT Re-Evaluation 06/14/24    Authorization Type U[employer] MCR - Auth required    OT Start Time 1100    OT Stop Time 1140    OT Time Calculation (min) 40 min    Activity Tolerance Patient tolerated treatment well    Behavior During Therapy [facility] for tasks assessed/performed           Past Medical History:  Diagnosis Date   Arrhythmia    Atrial fibrillation 10/11/2021   Cerebrovascular disease    moderate per 2021 MRI   Cholecystitis 09/01/2021   Chronic kidney disease, stage 3 unspecified 10/11/2021   Dizzy spells 03/08/2020   Erectile dysfunction 10/11/2021   Essential hypertension 06/14/2015   Essential tremor    Glaucoma 08/31/2021   Hyperlipidemia 06/14/2015   Irregular heart beat    Mild cognitive impairment of uncertain or unknown etiology 05/21/2023   Mixed hyperlipidemia    Multiple lacunar infarcts    bilateral centrum semiovale and right parietal cortex.   Obesity 10/11/2021   Obstructive sleep apnea    no CPAP   Other male erectile dysfunction    Primary open angle glaucoma (POAG) of left eye, moderate stage 10/11/2021   Primary open-angle glaucoma, right eye, mild stage 10/11/2021   Renal insufficiency 09/06/2022   Sinus bradycardia 03/08/2020   Tremor 10/11/2021   Type 2 diabetes mellitus with other specified complication (HCC)    Type II diabetes mellitus 10/11/2021   Vascular dementia [facility])    Past Surgical History:  Procedure Laterality Date   CHOLECYSTECTOMY N/A 08/31/2021   Procedure: LAPAROSCOPIC CHOLECYSTECTOMY;  Surgeon: Lyndel Deward PARAS, MD;  Location: [facility] OR;  Service: General;  Laterality: N/A;   EYE SURGERY      Glaucoma surgery   PROSTATE SURGERY     Patient Active Problem List   Diagnosis Date Noted   Essential tremor    Mixed hyperlipidemia    Irregular heart beat    Other male erectile dysfunction    Type 2 diabetes mellitus with other specified complication (HCC)    Vascular dementia (HCC)    Mild cognitive impairment of uncertain or unknown etiology 05/21/2023   Obstructive sleep apnea    Multiple lacunar infarcts    Cerebrovascular disease    Renal insufficiency 09/06/2022   Atrial fibrillation 10/11/2021   Chronic kidney disease, stage 3 unspecified 10/11/2021   Obesity 10/11/2021   Erectile dysfunction 10/11/2021   Primary open angle glaucoma (POAG) of left eye, moderate stage 10/11/2021   Primary open-angle glaucoma, right eye, mild stage 10/11/2021   Tremor 10/11/2021   Type II diabetes mellitus 10/11/2021   Cholecystitis 09/01/2021   Glaucoma 08/31/2021   Arrhythmia    Dizzy spells 03/08/2020   Sinus bradycardia 03/08/2020   Essential hypertension 06/14/2015   Hyperlipidemia 06/14/2015    ONSET DATE: 02/17/2024 (referral date)   REFERRING DIAG: G20.A1 (ICD-10-CM) - Parkinson's disease without dyskinesia or fluctuating manifestations (HCC)  THERAPY DIAG:  Other symptoms and signs involving the nervous system  Other symptoms and signs involving the musculoskeletal system  Other lack of coordination  Unsteadiness on feet  Attention and concentration deficit  Rationale for  Evaluation and Treatment: Rehabilitation  SUBJECTIVE:   SUBJECTIVE STATEMENT: No pain and no falls.    Pt accompanied by: self  PERTINENT HISTORY: PMH: PD (diagnosed 2024), A fb, stage 3 CKD, HLD, HTN, multiple lacunar infarcts, obesity, type II diabetes   PRECAUTIONS: Fall  WEIGHT BEARING RESTRICTIONS: No  PAIN:  Are you having pain? No except sometimes low back pain  FALLS: Has patient fallen in last 6 months? No  LIVING ENVIRONMENT: Lives with: lives with their spouse and  lives with their son Lives in: 1 story home Stairs: Yes: External: 3 steps; bilateral but cannot reach both Has following equipment at home: shower chair and Grab bars  PLOF: Independent with basic ADLs  PATIENT GOALS: just go with the flow  OBJECTIVE:  Note: Objective measures were completed at Evaluation unless otherwise noted.  HAND DOMINANCE: Right  ADLs: Eating: mod I  - pt reports occasional tremors Grooming: mod I  UB Dressing: wife does buttons on dress shirt LB Dressing: mod I - slower  Toileting: mod I  Bathing: mod I (getting down in tub for bathing) Tub Shower transfers: mod I  Equipment: Grab bars  IADLs: Shopping: wife mostly does, but pt will sometimes accompany her. Pt will occasionally go to get smaller purchases Light housekeeping: pt does some laundry and washing dishes, sweeping Meal Prep: wife mostly does (pt every once in awhile does breakfast)  Community mobility: pt drives short distances (avoids highway), wife drives mostly Medication management: pt doing Landscape architect: wife does Handwriting: 100% legible and Mild micrographia w/ decline in legibility if writing longer  MOBILITY STATUS: Independent   FUNCTIONAL OUTCOME MEASURES: Fastening/unfastening 3 buttons: 2 min. 21 sec (w/o glasses and reports difficulty seeing buttons) Physical performance test: PPT#2 (simulated eating) 12.95 sec (holding spoon at end)  & PPT#4 (donning/doffing jacket): 20.22 sec (w/ hospital gown)   04/28/24: fastening/unfastening 3 buttons: 54 sec w/ glasses on  COORDINATION: 9 Hole Peg test: Right: 38.28 sec; Left: 47.56 sec Box and Blocks:  Right 47 blocks, Left 45 blocks Tremors: Resting and Right  UE ROM:  WFL   SENSATION: Pt reports diminished RUE   COGNITION: Overall cognitive status: Impaired and decreased processing, however pt more alert and participatory for today's evaluation  OBSERVATIONS: Bradykinesia, Hypokinesia, and Rt resting tremors,  decreased vision d/t glaucoma                                                                                                                    TREATMENT:  Pt brought in his own jacket, therefore reviewed donning and doffing jacket - pt did better with this today. Pt already has written instructions on cape method in binder, therefore did not issue.   Reviewed PWR! Seated x 6 reps each of PWR! Up, Rock, and Twists w/ cues for large amplitude movements, and head/eye boosts - less cueing required than last session. Then extensively reviewed modified PWR! Step x 20 reps. Pt encouraged to count out loud when doing ex's  Reviewed  golf solitaire with patient - pt did well with min cues  Reviewed ways to prevent future PD related complications and ways to keep thinking skills sharp   PATIENT EDUCATION: Education details: see above Person educated: Patient Education method: Explanation, Demonstration, Tactile cues, Verbal cues, and Handouts Education comprehension: verbalized understanding, returned demonstration, verbal cues required, tactile cues required, and needs further education  HOME EXERCISE PROGRAM: 05/12/2024: golf solitaire 05/17/24: review of PWR! All 4'S AND PWR! Seated up, rock and twist 05/19/24: review of PWR! Sitting, review of ways to prevent future related PD complications, ways to keep thinking skills sharp, and review of cape method for jacket  GOALS: Goals reviewed with patient? Yes  SHORT TERM GOALS: Target date: 05/14/24  Independent with PD specific HEP  (review and update prn)  Baseline: Goal status: MET  2.  Pt will verbalize understanding of adaptive strategies to increase ease with ADLS/IADLS   Baseline:  Goal status: INITIAL  3.  Pt will demonstrate increased ease with dressing as evidenced by decreasing PPT#4 (don/ doff jacket) to 15 secs or less  Baseline: 20.22 sec w/ hospital gown Goal status: IN PROGRESS  4.  Pt will demonstrate improved ease with  fastening buttons as evidenced by decreasing 3 button/unbutton time by 30 seconds or more wearing his glasses Baseline: 2 min and 21 sec (w/o glasses), 1:43 at screen 02/17/24 04/28/24: 54 sec w/ glasses Goal status: MET, and revised to performing in less than 50 sec.   5.  Pt will write a paragraph with no significant decrease in size and maintain 100% legibility  Baseline:  Goal status: IN PROGRESS   LONG TERM GOALS: Target date: 06/14/24  Pt will verbalize understanding of ways to prevent future PD related complications and appropriate community resources prn  Baseline:  Goal status: MET  2.  Pt will verbalize understanding of ways to keep thinking skills sharp and ways to compensate for STM changes  Baseline:  Goal status: MET  3.  Pt will demonstrate improved fine motor coordination for ADLs as evidenced by decreasing 9 hole peg test score for Rt hand by 4 secs  Baseline: 38.28 sec Goal status: INITIAL  4.  Pt will demonstrate improved fine motor coordination for ADLs as evidenced by decreasing 9 hole peg test score for Lt hand by 7 secs or more Baseline: 47.56 sec Goal status: INITIAL  5.  Pt will demonstrate improved ease with fastening buttons as evidenced by decreasing 3 button/unbutton time by 40 seconds with glasses Baseline: 2 min, 21 sec  Goal status: INITIAL  ASSESSMENT:  CLINICAL IMPRESSION: Patient progressing towards goals and has met LTG #1 and 2. Pt benefits from review and does better with repetition. Pt continues to need reinforcement of importance of carrying over HEP's and recommendations at home.    PERFORMANCE DEFICITS: in functional skills including ADLs, IADLs, coordination, dexterity, sensation, tone, ROM, strength, Fine motor control, Gross motor control, mobility, balance, body mechanics, and UE functional use, cognitive skills including problem solving and safety awareness, and psychosocial skills including coping strategies.   IMPAIRMENTS: are  limiting patient from ADLs, IADLs, rest and sleep, leisure, and social participation.   COMORBIDITIES:  may have co-morbidities  that affects occupational performance. Patient will benefit from skilled OT to address above impairments and improve overall function.  REHAB POTENTIAL: Good  PLAN:  OT FREQUENCY: 2x/week  OT DURATION: 8 weeks  PLANNED INTERVENTIONS: 97535 self care/ADL training, 02889 therapeutic exercise, 97530 therapeutic activity, 97112 neuromuscular re-education, 97140 manual  therapy, [id number] aquatic therapy, 97039 fluidotherapy, [id number] Orthotic Initial, 02236 Orthotic/Prosthetic subsequent, passive range of motion, functional mobility training, visual/perceptual remediation/compensation, coping strategies training, patient/family education, and DME and/or AE instructions  RECOMMENDED OTHER SERVICES: none at this time  CONSULTED AND AGREED WITH PLAN OF CARE: Patient  PLAN FOR NEXT SESSION:   progress towards remaining STG's   Burnard JINNY Roads, OT 05/19/2024, 11:40 AM

## 2024-05-19 NOTE — Therapy (Signed)
 OUTPATIENT PHYSICAL THERAPY NEURO TREATMENT    Patient Name: Joseph Lamb MRN: [id number] DOB:Apr 04, 1951, 73 y.o., male Today's Date: 05/19/2024   PCP: Loreli Kins, MD REFERRING PROVIDER: Evonnie Asberry RAMAN, DO  END OF SESSION:  PT End of Session - 05/19/24 1022     Visit Number 6    Number of Visits 9   plus eval   Date for PT Re-Evaluation 06/09/24   Recert to cover remaining visits   Authorization Type [employer] Medicare    PT Start Time 1020    PT Stop Time 1059    PT Time Calculation (min) 39 min    Equipment Utilized During Treatment Gait belt    Activity Tolerance Patient tolerated treatment well    Behavior During Therapy [facility] for tasks assessed/performed;Flat affect             Past Medical History:  Diagnosis Date   Arrhythmia    Atrial fibrillation 10/11/2021   Cerebrovascular disease    moderate per 2021 MRI   Cholecystitis 09/01/2021   Chronic kidney disease, stage 3 unspecified 10/11/2021   Dizzy spells 03/08/2020   Erectile dysfunction 10/11/2021   Essential hypertension 06/14/2015   Essential tremor    Glaucoma 08/31/2021   Hyperlipidemia 06/14/2015   Irregular heart beat    Mild cognitive impairment of uncertain or unknown etiology 05/21/2023   Mixed hyperlipidemia    Multiple lacunar infarcts    bilateral centrum semiovale and right parietal cortex.   Obesity 10/11/2021   Obstructive sleep apnea    no CPAP   Other male erectile dysfunction    Primary open angle glaucoma (POAG) of left eye, moderate stage 10/11/2021   Primary open-angle glaucoma, right eye, mild stage 10/11/2021   Renal insufficiency 09/06/2022   Sinus bradycardia 03/08/2020   Tremor 10/11/2021   Type 2 diabetes mellitus with other specified complication (HCC)    Type II diabetes mellitus 10/11/2021   Vascular dementia [facility])    Past Surgical History:  Procedure Laterality Date   CHOLECYSTECTOMY N/A 08/31/2021   Procedure: LAPAROSCOPIC CHOLECYSTECTOMY;  Surgeon:  Lyndel Deward PARAS, MD;  Location: [facility] OR;  Service: General;  Laterality: N/A;   EYE SURGERY     Glaucoma surgery   PROSTATE SURGERY     Patient Active Problem List   Diagnosis Date Noted   Essential tremor    Mixed hyperlipidemia    Irregular heart beat    Other male erectile dysfunction    Type 2 diabetes mellitus with other specified complication (HCC)    Vascular dementia (HCC)    Mild cognitive impairment of uncertain or unknown etiology 05/21/2023   Obstructive sleep apnea    Multiple lacunar infarcts    Cerebrovascular disease    Renal insufficiency 09/06/2022   Atrial fibrillation 10/11/2021   Chronic kidney disease, stage 3 unspecified 10/11/2021   Obesity 10/11/2021   Erectile dysfunction 10/11/2021   Primary open angle glaucoma (POAG) of left eye, moderate stage 10/11/2021   Primary open-angle glaucoma, right eye, mild stage 10/11/2021   Tremor 10/11/2021   Type II diabetes mellitus 10/11/2021   Cholecystitis 09/01/2021   Glaucoma 08/31/2021   Arrhythmia    Dizzy spells 03/08/2020   Sinus bradycardia 03/08/2020   Essential hypertension 06/14/2015   Hyperlipidemia 06/14/2015    ONSET DATE: 02/17/2024 (referral)   REFERRING DIAG: G20.A1 (ICD-10-CM) - Parkinson's disease without dyskinesia or fluctuating manifestations (HCC)  THERAPY DIAG:  Other lack of coordination  Unsteadiness on feet  Other abnormalities of gait and  mobility  Muscle weakness (generalized)  Rationale for Evaluation and Treatment: Rehabilitation  SUBJECTIVE:                                                                                                                                                                                             SUBJECTIVE STATEMENT: Pt presents without AD. States he is doing okay. No falls. Still sleeping on the couch. Is a little bit lightheaded today but not bad. Has been trying to do his HEP some.    Pt accompanied by: self (wife in the car)    PERTINENT HISTORY: PD, A fb, stage 3 CKD, HLD, HTN, multiple lacunar infarcts, obesity, type II diabetes  PAIN:  Are you having pain? Yes: NPRS scale: 3/10 Pain location: Upper R quadrant of stomach Pain description: Burning  PRECAUTIONS: Fall  RED FLAGS: None   WEIGHT BEARING RESTRICTIONS: No  FALLS: Has patient fallen in last 6 months? No  LIVING ENVIRONMENT: Lives with: lives with their spouse Lives in: House/apartment Stairs: Yes: External: 2 in front, 8 in back steps; pt cannot recall if he has rails Has following equipment at home: shower chair and Grab bars  PLOF: Independent  PATIENT GOALS: I need to reengage the physical part of this therapy on a regular basis. I have been kind of lax   OBJECTIVE:  Note: Objective measures were completed at Evaluation unless otherwise noted.  DIAGNOSTIC FINDINGS: None relevant   COGNITION: Overall cognitive status: History of cognitive impairments - at baseline   SENSATION: Pt denies significant numbness/tingling in all extremities   EDEMA: None currently    MUSCLE TONE: RLE: Rigidity   POSTURE: rounded shoulders, forward head, increased thoracic kyphosis, weight shift right, and tremor in RUE   LOWER EXTREMITY ROM:     Active  Right Eval Left Eval  Hip flexion    Hip extension    Hip abduction    Hip adduction    Hip internal rotation    Hip external rotation    Knee flexion    Knee extension    Ankle dorsiflexion    Ankle plantarflexion    Ankle inversion    Ankle eversion     (Blank rows = not tested)  LOWER EXTREMITY MMT:    MMT Right Eval Left Eval  Hip flexion    Hip extension    Hip abduction    Hip adduction    Hip internal rotation    Hip external rotation    Knee flexion    Knee extension    Ankle dorsiflexion    Ankle plantarflexion    Ankle inversion  Ankle eversion    (Blank rows = not tested)  BED MOBILITY:  Not tested Pt reports he frequently sleeps on the couch. Is  slow getting into/out of the bed   TRANSFERS: Sit to stand: Modified independence  Assistive device utilized: None     Stand to sit: Modified independence  Assistive device utilized: None      RAMP:  Not tested  CURB:  Not tested  STAIRS: Not tested GAIT: Gait pattern: step through pattern, decreased arm swing- Right, decreased stride length, decreased hip/knee flexion- Right, decreased hip/knee flexion- Left, shuffling, lateral hip instability, decreased trunk rotation, trunk flexed, poor foot clearance- Right, and poor foot clearance- Left Distance walked: Various clinic distances  Assistive device utilized: None Level of assistance: Modified independence Comments: Lateral deviations noted when performing sit > stand> walk but otherwise no instability noted    FUNCTIONAL TESTS:       VITALS  Vitals:   05/19/24 1026 05/19/24 1057  BP: 113/66 102/67 Comment: End of session  Pulse: 65 78                                                                                                     TREATMENT:   Therapeutic Activity/Self-care/NMR Assessed vitals (see above) and WNL SciFit multi-peaks level 13.0 for 8 minutes using BUE/BLEs for neural priming for reciprocal movement, dynamic cardiovascular warmup and increased amplitude of stepping. RPE of 5/10 following activity  In // bars, lateral adv/retreat over 4 beam while tossing ball with intern for improved stepping strategies, lateral weight shifting, step clearance and anticipatory balance strategies, x10 reps on LLE. Progressed to adding cog dual-task (started w/math equations and was too easy, so progressed to naming animals in alphabetical order), x13 reps per side. Pt significantly challenged by this and required mod multimodal cues to remember task (stepping over beam and throwing ball) as well as remembering where he was in the alphabet. Noted decreased step clearance w/RLE and pt frequently unaware that he had knocked  beam over w/RLE. CGA throughout for safety.  Reassessed vitals (see above) and pt reported feeling weak following standing activity. Systolic BP did drop from initially reading but not to orthostatic limit. Provided education on BP management and importance of hydration, pt verbalized understanding.    PATIENT EDUCATION: Education details: Continued to reiterate importance of trying to go to sleep in bed rather than the couch in order to work on PWR moves prior to standing first thing in the AM, continue HEP, plan to DC next week, importance of staying hydrated  Person educated: Patient Education method: Explanation, Demonstration, and Verbal cues Education comprehension: verbalized understanding, returned demonstration, verbal cues required, and needs further education  HOME EXERCISE PROGRAM: To be reviewed from previous POC:  [id number], standing/quadruped PWR moves, fwd/retro stepping  (Reviewed all except quadruped PWR moves on 7/23)  GOALS: Goals reviewed with patient? Yes  STG = LTG DUE TO POC LENGTH    LONG TERM GOALS: Target date: 05/11/2024   Pt will be independent with PD-specific HEP for improved strength, balance, transfers and gait.  Baseline:  not performing regularly  Goal status: IN PROGRESS   2.  Pt will score >/= 23/28 on MiniBest for improved stepping strategies and reduced fall risk  Baseline: 20/28 Goal status: IN PROGRESS   3.  Pt will perform 5 sit to stands from 14 height w/no UE support for improved functional BLE strength and transfers  Baseline: reports difficulty standing from the couch  Goal status: MET  4.  Pt will perform floor transfer mod I for improved functional BLE strength and transferring into/out of bathtub.  Baseline:  Goal status: MET   NEW LONG TERM GOALS FOR EXTENDED POC: Target date: 06/09/2024   Pt will be independent with PD-specific HEP for improved strength, balance, transfers and gait.  Baseline: not performing regularly  Goal  status: IN PROGRESS  2.  Pt will score >/= 23/28 on MiniBest for improved stepping strategies and reduced fall risk  Baseline: 20/28 Goal status: IN PROGRESS    ASSESSMENT:  CLINICAL IMPRESSION: Emphasis of skilled PT session on monitoring vitals, stepping strategies, anticipatory balance strategies and cog dual-tasking. Pt reported feeling weaker than usual  but vitals WNL. Pt's systolic BP did drop at end of session following standing activity, but not to orthostatic level. Pt very challenged by cog dual-task, especially when stepping w/RLE as he demonstrates decreased step clearance and difficulty remembering physical task. Continue POC.    OBJECTIVE IMPAIRMENTS: Abnormal gait, decreased activity tolerance, decreased balance, decreased cognition, decreased coordination, decreased knowledge of use of DME, difficulty walking, decreased strength, decreased safety awareness, impaired perceived functional ability, improper body mechanics, postural dysfunction, and pain  ACTIVITY LIMITATIONS: carrying, lifting, squatting, stairs, locomotion level, and caring for others  PARTICIPATION LIMITATIONS: driving, shopping, community activity, and yard work  PERSONAL FACTORS: Fitness and 1 comorbidity: PD are also affecting patient's functional outcome.   REHAB POTENTIAL: Good  CLINICAL DECISION MAKING: Evolving/moderate complexity  EVALUATION COMPLEXITY: Moderate  PLAN:  PT FREQUENCY: 2x/week  PT DURATION: 4 weeks + 4 weeks (recert)   PLANNED INTERVENTIONS: 02835- PT Re-evaluation, 97750- Physical Performance Testing, 97110-Therapeutic exercises, 97530- Therapeutic activity, [id number]- Neuromuscular re-education, 97535- Self Care, 02859- Manual therapy, [id number]- Gait training, [phone]- Canalith repositioning, [id number]- Aquatic Therapy, [phone] (1-2 muscles), 20561 (3+ muscles)- Dry Needling, Patient/Family education, Balance training, Stair training, Joint mobilization, Spinal mobilization, Vestibular  training, and DME instructions  PLAN FOR NEXT SESSION: Cog dual-tasking. Tall kneel activities, RDLs or DL progression. Work on stepping strategies, lateral weight shifting, truncal mobility. Is he still sleeping on couch? (Pt to try to sleep in bed more so he can do his supine PWR moves prior to getting up)   Vale Peraza E Plumer Mittelstaedt, PT, DPT 05/19/2024, 12:17 PM

## 2024-05-24 ENCOUNTER — Encounter: Payer: Self-pay | Admitting: Physical Therapy

## 2024-05-24 ENCOUNTER — Encounter: Payer: Self-pay | Admitting: Occupational Therapy

## 2024-05-24 ENCOUNTER — Ambulatory Visit: Admitting: Speech Pathology

## 2024-05-24 ENCOUNTER — Ambulatory Visit: Admitting: Physical Therapy

## 2024-05-24 ENCOUNTER — Ambulatory Visit: Admitting: Occupational Therapy

## 2024-05-24 ENCOUNTER — Encounter: Payer: Self-pay | Admitting: Speech Pathology

## 2024-05-24 VITALS — BP 126/65 | HR 77

## 2024-05-24 DIAGNOSIS — R2689 Other abnormalities of gait and mobility: Secondary | ICD-10-CM

## 2024-05-24 DIAGNOSIS — R471 Dysarthria and anarthria: Secondary | ICD-10-CM

## 2024-05-24 DIAGNOSIS — R278 Other lack of coordination: Secondary | ICD-10-CM | POA: Diagnosis not present

## 2024-05-24 DIAGNOSIS — M6281 Muscle weakness (generalized): Secondary | ICD-10-CM

## 2024-05-24 DIAGNOSIS — R293 Abnormal posture: Secondary | ICD-10-CM

## 2024-05-24 DIAGNOSIS — R2681 Unsteadiness on feet: Secondary | ICD-10-CM

## 2024-05-24 DIAGNOSIS — R29818 Other symptoms and signs involving the nervous system: Secondary | ICD-10-CM

## 2024-05-24 DIAGNOSIS — R41840 Attention and concentration deficit: Secondary | ICD-10-CM

## 2024-05-24 DIAGNOSIS — R29898 Other symptoms and signs involving the musculoskeletal system: Secondary | ICD-10-CM

## 2024-05-24 DIAGNOSIS — R1310 Dysphagia, unspecified: Secondary | ICD-10-CM

## 2024-05-24 NOTE — Therapy (Signed)
 OUTPATIENT OCCUPATIONAL THERAPY PARKINSON'S TREATMENT  Patient Name: Joseph Lamb MRN: [id number] DOB:04/24/1951, 73 y.o., male Today's Date: 05/24/2024  PCP: Loreli Kins, MD REFERRING PROVIDER: Evonnie Asberry RAMAN, DO  END OF SESSION:  OT End of Session - 05/24/24 1059     Visit Number 8    Number of Visits 17    Date for OT Re-Evaluation 06/14/24    Authorization Type UHC MCR - Auth required    OT Start Time 1100    OT Stop Time 1145    OT Time Calculation (min) 45 min    Activity Tolerance Patient tolerated treatment well    Behavior During Therapy [facility] for tasks assessed/performed           Past Medical History:  Diagnosis Date   Arrhythmia    Atrial fibrillation 10/11/2021   Cerebrovascular disease    moderate per 2021 MRI   Cholecystitis 09/01/2021   Chronic kidney disease, stage 3 unspecified 10/11/2021   Dizzy spells 03/08/2020   Erectile dysfunction 10/11/2021   Essential hypertension 06/14/2015   Essential tremor    Glaucoma 08/31/2021   Hyperlipidemia 06/14/2015   Irregular heart beat    Mild cognitive impairment of uncertain or unknown etiology 05/21/2023   Mixed hyperlipidemia    Multiple lacunar infarcts    bilateral centrum semiovale and right parietal cortex.   Obesity 10/11/2021   Obstructive sleep apnea    no CPAP   Other male erectile dysfunction    Primary open angle glaucoma (POAG) of left eye, moderate stage 10/11/2021   Primary open-angle glaucoma, right eye, mild stage 10/11/2021   Renal insufficiency 09/06/2022   Sinus bradycardia 03/08/2020   Tremor 10/11/2021   Type 2 diabetes mellitus with other specified complication (HCC)    Type II diabetes mellitus 10/11/2021   Vascular dementia [facility])    Past Surgical History:  Procedure Laterality Date   CHOLECYSTECTOMY N/A 08/31/2021   Procedure: LAPAROSCOPIC CHOLECYSTECTOMY;  Surgeon: Lyndel Deward PARAS, MD;  Location: [facility] OR;  Service: General;  Laterality: N/A;   EYE SURGERY      Glaucoma surgery   PROSTATE SURGERY     Patient Active Problem List   Diagnosis Date Noted   Essential tremor    Mixed hyperlipidemia    Irregular heart beat    Other male erectile dysfunction    Type 2 diabetes mellitus with other specified complication (HCC)    Vascular dementia (HCC)    Mild cognitive impairment of uncertain or unknown etiology 05/21/2023   Obstructive sleep apnea    Multiple lacunar infarcts    Cerebrovascular disease    Renal insufficiency 09/06/2022   Atrial fibrillation 10/11/2021   Chronic kidney disease, stage 3 unspecified 10/11/2021   Obesity 10/11/2021   Erectile dysfunction 10/11/2021   Primary open angle glaucoma (POAG) of left eye, moderate stage 10/11/2021   Primary open-angle glaucoma, right eye, mild stage 10/11/2021   Tremor 10/11/2021   Type II diabetes mellitus 10/11/2021   Cholecystitis 09/01/2021   Glaucoma 08/31/2021   Arrhythmia    Dizzy spells 03/08/2020   Sinus bradycardia 03/08/2020   Essential hypertension 06/14/2015   Hyperlipidemia 06/14/2015    ONSET DATE: 02/17/2024 (referral date)   REFERRING DIAG: G20.A1 (ICD-10-CM) - Parkinson's disease without dyskinesia or fluctuating manifestations (HCC)  THERAPY DIAG:  Other lack of coordination  Unsteadiness on feet  Other symptoms and signs involving the nervous system  Other symptoms and signs involving the musculoskeletal system  Attention and concentration deficit  Abnormal posture  Rationale for Evaluation and Treatment: Rehabilitation  SUBJECTIVE:   SUBJECTIVE STATEMENT: No pain and no falls.  I've been doing some of them (re: HEP's)   Pt accompanied by: self  PERTINENT HISTORY: PMH: PD (diagnosed 2024), A fb, stage 3 CKD, HLD, HTN, multiple lacunar infarcts, obesity, type II diabetes   PRECAUTIONS: Fall  WEIGHT BEARING RESTRICTIONS: No  PAIN:  Are you having pain? No except sometimes low back pain  FALLS: Has patient fallen in last 6 months?  No  LIVING ENVIRONMENT: Lives with: lives with their spouse and lives with their son Lives in: 1 story home Stairs: Yes: External: 3 steps; bilateral but cannot reach both Has following equipment at home: shower chair and Grab bars  PLOF: Independent with basic ADLs  PATIENT GOALS: just go with the flow  OBJECTIVE:  Note: Objective measures were completed at Evaluation unless otherwise noted.  HAND DOMINANCE: Right  ADLs: Eating: mod I  - pt reports occasional tremors Grooming: mod I  UB Dressing: wife does buttons on dress shirt LB Dressing: mod I - slower  Toileting: mod I  Bathing: mod I (getting down in tub for bathing) Tub Shower transfers: mod I  Equipment: Grab bars  IADLs: Shopping: wife mostly does, but pt will sometimes accompany her. Pt will occasionally go to get smaller purchases Light housekeeping: pt does some laundry and washing dishes, sweeping Meal Prep: wife mostly does (pt every once in awhile does breakfast)  Community mobility: pt drives short distances (avoids highway), wife drives mostly Medication management: pt doing Landscape architect: wife does Handwriting: 100% legible and Mild micrographia w/ decline in legibility if writing longer  MOBILITY STATUS: Independent   FUNCTIONAL OUTCOME MEASURES: Fastening/unfastening 3 buttons: 2 min. 21 sec (w/o glasses and reports difficulty seeing buttons) Physical performance test: PPT#2 (simulated eating) 12.95 sec (holding spoon at end)  & PPT#4 (donning/doffing jacket): 20.22 sec (w/ hospital gown)   04/28/24: fastening/unfastening 3 buttons: 54 sec w/ glasses on  COORDINATION: 9 Hole Peg test: Right: 38.28 sec; Left: 47.56 sec Box and Blocks:  Right 47 blocks, Left 45 blocks Tremors: Resting and Right  UE ROM:  WFL   SENSATION: Pt reports diminished RUE   COGNITION: Overall cognitive status: Impaired and decreased processing, however pt more alert and participatory for today's  evaluation  OBSERVATIONS: Bradykinesia, Hypokinesia, and Rt resting tremors, decreased vision d/t glaucoma                                                                                                                    TREATMENT:    Assessed progress to date and assessed STG's - see below.  Emphasized importance of doing HEP's daily - pt with poor carryover at home Also emphasized attending 1-2 community exercise classes for PD  UBE x 8 mintues, level 5 for UE strength/endurance and cardio to increase HR - pt asked to keep RPM to 40 however pt unable to maintain - may need level of resistance reduced next session  PATIENT  EDUCATION: Education details: see above Person educated: Patient Education method: Explanation, Demonstration, Tactile cues, Verbal cues, and Handouts Education comprehension: verbalized understanding, returned demonstration, verbal cues required, tactile cues required, and needs further education  HOME EXERCISE PROGRAM: 05/12/2024: golf solitaire 05/17/24: review of PWR! All 4'S AND PWR! Seated up, rock and twist 05/19/24: review of PWR! Sitting, review of ways to prevent future related PD complications, ways to keep thinking skills sharp, and review of cape method for jacket  GOALS: Goals reviewed with patient? Yes  SHORT TERM GOALS: Target date: 05/14/24  Independent with PD specific HEP  (review and update prn)  Baseline: Goal status: MET  2.  Pt will verbalize understanding of adaptive strategies to increase ease with ADLS/IADLS   Baseline:  Goal status: MET   3.  Pt will demonstrate increased ease with dressing as evidenced by decreasing PPT#4 (don/ doff jacket) to 15 secs or less  Baseline: 20.22 sec w/ hospital gown Goal status: MET (05/24/24: 13.72 sec)   4.  Pt will demonstrate improved ease with fastening buttons as evidenced by decreasing 3 button/unbutton time by 30 seconds or more wearing his glasses Baseline: 2 min and 21 sec (w/o glasses), 1:43  at screen 02/17/24 04/28/24: 54 sec w/ glasses, 05/24/24: 70.65 sec Goal status:  PARTIALLY MET (met original goal, did not meet updated goal), - revised to performing in less than 50 sec.    5.  Pt will write a paragraph with no significant decrease in size and maintain 100% legibility  Baseline:  Goal status: MET (w/ cues to slide forearm, slow down, write big)   LONG TERM GOALS: Target date: 06/14/24  Pt will verbalize understanding of ways to prevent future PD related complications and appropriate community resources prn  Baseline:  Goal status: MET  2.  Pt will verbalize understanding of ways to keep thinking skills sharp and ways to compensate for STM changes  Baseline:  Goal status: MET  3.  Pt will demonstrate improved fine motor coordination for ADLs as evidenced by decreasing 9 hole peg test score for Rt hand by 4 secs  Baseline: 38.28 sec Goal status: INITIAL  4.  Pt will demonstrate improved fine motor coordination for ADLs as evidenced by decreasing 9 hole peg test score for Lt hand by 7 secs or more Baseline: 47.56 sec Goal status: INITIAL  5.  Pt will demonstrate improved ease with fastening buttons as evidenced by decreasing 3 button/unbutton time by 40 seconds with glasses Baseline: 2 min, 21 sec  Goal status: INITIAL  ASSESSMENT:  CLINICAL IMPRESSION: Patient met STG's however inconsistent with buttons and requires cues to maintain handwriting size and legibility - had issues with spacing b/t words. Pt benefits from review and does better with repetition. Pt continues to need reinforcement of importance of carrying over HEP's and recommendations at home.    PERFORMANCE DEFICITS: in functional skills including ADLs, IADLs, coordination, dexterity, sensation, tone, ROM, strength, Fine motor control, Gross motor control, mobility, balance, body mechanics, and UE functional use, cognitive skills including problem solving and safety awareness, and psychosocial skills  including coping strategies.   IMPAIRMENTS: are limiting patient from ADLs, IADLs, rest and sleep, leisure, and social participation.   COMORBIDITIES:  may have co-morbidities  that affects occupational performance. Patient will benefit from skilled OT to address above impairments and improve overall function.  REHAB POTENTIAL: Good  PLAN:  OT FREQUENCY: 2x/week  OT DURATION: 8 weeks  PLANNED INTERVENTIONS: 97535 self care/ADL training, 02889 therapeutic exercise,  97530 therapeutic activity, 97112 neuromuscular re-education, 97140 manual therapy, [id number] aquatic therapy, 97039 fluidotherapy, 97760 Orthotic Initial, 02236 Orthotic/Prosthetic subsequent, passive range of motion, functional mobility training, visual/perceptual remediation/compensation, coping strategies training, patient/family education, and DME and/or AE instructions  RECOMMENDED OTHER SERVICES: none at this time  CONSULTED AND AGREED WITH PLAN OF CARE: Patient  PLAN FOR NEXT SESSION:   standing/coordination tasks, UBE (level 3 or lower)    Burnard JINNY Roads, OT 05/24/2024, 11:00 AM

## 2024-05-24 NOTE — Therapy (Signed)
 OUTPATIENT PHYSICAL THERAPY NEURO TREATMENT    Patient Name: Joseph Lamb MRN: [id number] DOB:[birth year], 73 y.o., male Today's Date: 05/25/2024   PCP: Loreli Kins, MD REFERRING PROVIDER: Evonnie Asberry RAMAN, DO  END OF SESSION:    05/24/24 1018  PT Visits / Re-Eval  Visit Number 7  Number of Visits 9 (plus eval)  Date for PT Re-Evaluation 06/09/24 (Recert to cover remaining visits)  Authorization  Authorization Type UHC Medicare  PT Time Calculation  PT Start Time 1016  PT Stop Time 1058  PT Time Calculation (min) 42 min  PT - End of Session  Equipment Utilized During Treatment Gait belt  Activity Tolerance Patient tolerated treatment well  Behavior During Therapy [facility] for tasks assessed/performed;Flat affect       Past Medical History:  Diagnosis Date   Arrhythmia    Atrial fibrillation 10/11/2021   Cerebrovascular disease    moderate per 2021 MRI   Cholecystitis 09/01/2021   Chronic kidney disease, stage 3 unspecified 10/11/2021   Dizzy spells 03/08/2020   Erectile dysfunction 10/11/2021   Essential hypertension 06/14/2015   Essential tremor    Glaucoma 08/31/2021   Hyperlipidemia 06/14/2015   Irregular heart beat    Mild cognitive impairment of uncertain or unknown etiology 05/21/2023   Mixed hyperlipidemia    Multiple lacunar infarcts    bilateral centrum semiovale and right parietal cortex.   Obesity 10/11/2021   Obstructive sleep apnea    no CPAP   Other male erectile dysfunction    Primary open angle glaucoma (POAG) of left eye, moderate stage 10/11/2021   Primary open-angle glaucoma, right eye, mild stage 10/11/2021   Renal insufficiency 09/06/2022   Sinus bradycardia 03/08/2020   Tremor 10/11/2021   Type 2 diabetes mellitus with other specified complication (HCC)    Type II diabetes mellitus 10/11/2021   Vascular dementia [facility])    Past Surgical History:  Procedure Laterality Date   CHOLECYSTECTOMY N/A 08/31/2021   Procedure:  LAPAROSCOPIC CHOLECYSTECTOMY;  Surgeon: Lyndel Deward PARAS, MD;  Location: [facility] OR;  Service: General;  Laterality: N/A;   EYE SURGERY     Glaucoma surgery   PROSTATE SURGERY     Patient Active Problem List   Diagnosis Date Noted   Essential tremor    Mixed hyperlipidemia    Irregular heart beat    Other male erectile dysfunction    Type 2 diabetes mellitus with other specified complication (HCC)    Vascular dementia (HCC)    Mild cognitive impairment of uncertain or unknown etiology 05/21/2023   Obstructive sleep apnea    Multiple lacunar infarcts    Cerebrovascular disease    Renal insufficiency 09/06/2022   Atrial fibrillation 10/11/2021   Chronic kidney disease, stage 3 unspecified 10/11/2021   Obesity 10/11/2021   Erectile dysfunction 10/11/2021   Primary open angle glaucoma (POAG) of left eye, moderate stage 10/11/2021   Primary open-angle glaucoma, right eye, mild stage 10/11/2021   Tremor 10/11/2021   Type II diabetes mellitus 10/11/2021   Cholecystitis 09/01/2021   Glaucoma 08/31/2021   Arrhythmia    Dizzy spells 03/08/2020   Sinus bradycardia 03/08/2020   Essential hypertension 06/14/2015   Hyperlipidemia 06/14/2015    ONSET DATE: 02/17/2024 (referral)   REFERRING DIAG: G20.A1 (ICD-10-CM) - Parkinson's disease without dyskinesia or fluctuating manifestations (HCC)  THERAPY DIAG:  Unsteadiness on feet  Other abnormalities of gait and mobility  Muscle weakness (generalized)  Rationale for Evaluation and Treatment: Rehabilitation  SUBJECTIVE:  SUBJECTIVE STATEMENT: No falls. Still sleeping on the couch. Has been trying to work on his exercises at home. Wants to work more on some cognitive things with his balance. Is interested in trying the PWR moves class    Pt accompanied  by: self, wife   PERTINENT HISTORY: PD, A fb, stage 3 CKD, HLD, HTN, multiple lacunar infarcts, obesity, type II diabetes  PAIN:  Are you having pain? No  PRECAUTIONS: Fall  RED FLAGS: None   WEIGHT BEARING RESTRICTIONS: No  FALLS: Has patient fallen in last 6 months? No  LIVING ENVIRONMENT: Lives with: lives with their spouse Lives in: House/apartment Stairs: Yes: External: 2 in front, 8 in back steps; pt cannot recall if he has rails Has following equipment at home: shower chair and Grab bars  PLOF: Independent  PATIENT GOALS: I need to reengage the physical part of this therapy on a regular basis. I have been kind of lax   OBJECTIVE:  Note: Objective measures were completed at Evaluation unless otherwise noted.  DIAGNOSTIC FINDINGS: None relevant   COGNITION: Overall cognitive status: History of cognitive impairments - at baseline   SENSATION: Pt denies significant numbness/tingling in all extremities   EDEMA: None currently    MUSCLE TONE: RLE: Rigidity   POSTURE: rounded shoulders, forward head, increased thoracic kyphosis, weight shift right, and tremor in RUE   LOWER EXTREMITY ROM:     Active  Right Eval Left Eval  Hip flexion    Hip extension    Hip abduction    Hip adduction    Hip internal rotation    Hip external rotation    Knee flexion    Knee extension    Ankle dorsiflexion    Ankle plantarflexion    Ankle inversion    Ankle eversion     (Blank rows = not tested)  LOWER EXTREMITY MMT:    MMT Right Eval Left Eval  Hip flexion    Hip extension    Hip abduction    Hip adduction    Hip internal rotation    Hip external rotation    Knee flexion    Knee extension    Ankle dorsiflexion    Ankle plantarflexion    Ankle inversion    Ankle eversion    (Blank rows = not tested)  BED MOBILITY:  Not tested Pt reports he frequently sleeps on the couch. Is slow getting into/out of the bed   TRANSFERS: Sit to stand: Modified  independence  Assistive device utilized: None     Stand to sit: Modified independence  Assistive device utilized: None      RAMP:  Not tested  CURB:  Not tested  STAIRS: Not tested GAIT: Gait pattern: step through pattern, decreased arm swing- Right, decreased stride length, decreased hip/knee flexion- Right, decreased hip/knee flexion- Left, shuffling, lateral hip instability, decreased trunk rotation, trunk flexed, poor foot clearance- Right, and poor foot clearance- Left Distance walked: Various clinic distances  Assistive device utilized: None Level of assistance: Modified independence Comments: Lateral deviations noted when performing sit > stand> walk but otherwise no instability noted      VITALS  Vitals:   05/24/24 1028  BP: 126/65  Pulse: 77  TREATMENT:   Self-Care: Went over information for [employer] moves classes as pt interested in joining these after he completes PT Gave pt Lauraine Bend' email to sign up for the PD symposium as pt and pt's spouse unsure how to sign up with QR code Assessed vitals (see above) and WNL   NMR: Pt performs PWR! Moves in quadruped position, performed 10 reps of each    PWR! Up for improved posture  PWR! Rock for improved weighshifting  PWR! Twist for improved trunk rotation   PWR! Step for improved step initiation   Cues provided for technique and larger amplitude movements. Educated on the benefits of performing in quadruped position for hip mobility and strength  Pt able to get on and off floor with mod I using UE support   On blue foam beam with rebounder: Standing with feet apart, tossing red 2kg ball and catching it for improved coordination/reactive balance 10 reps Progressed to alternating stepping off and tossing ball and stepping back on with ball toss, cues to fully step back on as pt sometimes with decr foot clearance,  needing CGA for balance, needing intermittent cues to alternate legs when stepping, added in cognitive challenge with pt naming animals from A-Z, pt challenged with this and needing intermittent cues for naming or what letter of the alphabet pt was on, pt with slowed speed when adding cog dual task  On rockerboard in A/P direction: Reaching across body and grabbing bean bag in multi-directions and then tossing it into crate with added cog dual task of naming names from A-Z, cues for opening up hand big when grabbing bean bag, esp on R side, intermittent taps to bars for balance, performed 15 reps each side    PATIENT EDUCATION: Education details: See self-care, plan to D/C at next session with plan for PD screens in 6 months  Person educated: Patient and Spouse Education method: Explanation, Demonstration, and Verbal cues Education comprehension: verbalized understanding, returned demonstration, verbal cues required, and needs further education  HOME EXERCISE PROGRAM: To be reviewed from previous POC:  [id number], standing/quadruped PWR moves, fwd/retro stepping  (Reviewed all except quadruped PWR moves on 7/23)  GOALS: Goals reviewed with patient? Yes  STG = LTG DUE TO POC LENGTH    LONG TERM GOALS: Target date: 05/11/2024   Pt will be independent with PD-specific HEP for improved strength, balance, transfers and gait.  Baseline: not performing regularly  Goal status: IN PROGRESS   2.  Pt will score >/= 23/28 on MiniBest for improved stepping strategies and reduced fall risk  Baseline: 20/28 Goal status: IN PROGRESS   3.  Pt will perform 5 sit to stands from 14 height w/no UE support for improved functional BLE strength and transfers  Baseline: reports difficulty standing from the couch  Goal status: MET  4.  Pt will perform floor transfer mod I for improved functional BLE strength and transferring into/out of bathtub.  Baseline:  Goal status: MET   NEW LONG TERM GOALS FOR  EXTENDED POC: Target date: 06/09/2024   Pt will be independent with PD-specific HEP for improved strength, balance, transfers and gait.  Baseline: not performing regularly  Goal status: IN PROGRESS  2.  Pt will score >/= 23/28 on MiniBest for improved stepping strategies and reduced fall risk  Baseline: 20/28 Goal status: IN PROGRESS    ASSESSMENT:  CLINICAL IMPRESSION: Today's skilled session focused on quadruped PWR moves for improved mobility/larger amplitude movements to help with getting on and off the  floor and for review for PWR moves exercise class (pt interested in starting this after he wraps up with therapy). Remainder of session focused on high level balance task with cog dual tasking. Pt challenged with this and slows down motor movement and needs intermittent cues for naming. Will continue per POC.     OBJECTIVE IMPAIRMENTS: Abnormal gait, decreased activity tolerance, decreased balance, decreased cognition, decreased coordination, decreased knowledge of use of DME, difficulty walking, decreased strength, decreased safety awareness, impaired perceived functional ability, improper body mechanics, postural dysfunction, and pain  ACTIVITY LIMITATIONS: carrying, lifting, squatting, stairs, locomotion level, and caring for others  PARTICIPATION LIMITATIONS: driving, shopping, community activity, and yard work  PERSONAL FACTORS: Fitness and 1 comorbidity: PD are also affecting patient's functional outcome.   REHAB POTENTIAL: Good  CLINICAL DECISION MAKING: Evolving/moderate complexity  EVALUATION COMPLEXITY: Moderate  PLAN:  PT FREQUENCY: 2x/week  PT DURATION: 4 weeks + 4 weeks (recert)   PLANNED INTERVENTIONS: 02835- PT Re-evaluation, 97750- Physical Performance Testing, 97110-Therapeutic exercises, 97530- Therapeutic activity, [id number]- Neuromuscular re-education, 97535- Self Care, 02859- Manual therapy, [id number]- Gait training, [phone]- Canalith repositioning, [id number]- Aquatic  Therapy, [phone] (1-2 muscles), 20561 (3+ muscles)- Dry Needling, Patient/Family education, Balance training, Stair training, Joint mobilization, Spinal mobilization, Vestibular training, and DME instructions  PLAN FOR NEXT SESSION: check goals, D/C, review any more PWR moves for the class?    [place] LOISE Senate, PT, DPT 05/25/2024, 7:01 PM

## 2024-05-24 NOTE — Patient Instructions (Addendum)
   Jorrell - Write down a general routine you can follow  Each week, you and [name] come up with a list of priorities and tasks that need to be completed  Post the to do list - to track what is completed - do not cross off until it is completed in total  Get your ipad - you HW is to practice online  Bring the Third next class if he is available

## 2024-05-24 NOTE — Therapy (Signed)
 OUTPATIENT SPEECH LANGUAGE PATHOLOGY [facility]   Patient Name: Joseph Lamb MRN: [id number] DOB:03-01-1951, 73 y.o., male Today's Date: 05/24/2024  PCP: Dr. Joen Gentry REFERRING PROVIDER: Dr. Asberry Tat  END OF SESSION:  End of Session - 05/24/24 0937     Visit Number 4    Number of Visits 9    Date for SLP Re-Evaluation 06/22/24    SLP Start Time 0935    SLP Stop Time  1015    SLP Time Calculation (min) 40 min    Activity Tolerance Patient tolerated treatment well            Past Medical History:  Diagnosis Date   Arrhythmia    Atrial fibrillation 10/11/2021   Cerebrovascular disease    moderate per 2021 MRI   Cholecystitis 09/01/2021   Chronic kidney disease, stage 3 unspecified 10/11/2021   Dizzy spells 03/08/2020   Erectile dysfunction 10/11/2021   Essential hypertension 06/14/2015   Essential tremor    Glaucoma 08/31/2021   Hyperlipidemia 06/14/2015   Irregular heart beat    Mild cognitive impairment of uncertain or unknown etiology 05/21/2023   Mixed hyperlipidemia    Multiple lacunar infarcts    bilateral centrum semiovale and right parietal cortex.   Obesity 10/11/2021   Obstructive sleep apnea    no CPAP   Other male erectile dysfunction    Primary open angle glaucoma (POAG) of left eye, moderate stage 10/11/2021   Primary open-angle glaucoma, right eye, mild stage 10/11/2021   Renal insufficiency 09/06/2022   Sinus bradycardia 03/08/2020   Tremor 10/11/2021   Type 2 diabetes mellitus with other specified complication (HCC)    Type II diabetes mellitus 10/11/2021   Vascular dementia [facility])    Past Surgical History:  Procedure Laterality Date   CHOLECYSTECTOMY N/A 08/31/2021   Procedure: LAPAROSCOPIC CHOLECYSTECTOMY;  Surgeon: Lyndel Deward PARAS, MD;  Location: [facility] OR;  Service: General;  Laterality: N/A;   EYE SURGERY     Glaucoma surgery   PROSTATE SURGERY     Patient Active Problem List   Diagnosis Date Noted    Essential tremor    Mixed hyperlipidemia    Irregular heart beat    Other male erectile dysfunction    Type 2 diabetes mellitus with other specified complication (HCC)    Vascular dementia (HCC)    Mild cognitive impairment of uncertain or unknown etiology 05/21/2023   Obstructive sleep apnea    Multiple lacunar infarcts    Cerebrovascular disease    Renal insufficiency 09/06/2022   Atrial fibrillation 10/11/2021   Chronic kidney disease, stage 3 unspecified 10/11/2021   Obesity 10/11/2021   Erectile dysfunction 10/11/2021   Primary open angle glaucoma (POAG) of left eye, moderate stage 10/11/2021   Primary open-angle glaucoma, right eye, mild stage 10/11/2021   Tremor 10/11/2021   Type II diabetes mellitus 10/11/2021   Cholecystitis 09/01/2021   Glaucoma 08/31/2021   Arrhythmia    Dizzy spells 03/08/2020   Sinus bradycardia 03/08/2020   Essential hypertension 06/14/2015   Hyperlipidemia 06/14/2015    ONSET DATE: 02/17/24 referral date   REFERRING DIAG: G20.A1 (ICD-10-CM) - 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   PAIN:  Are you  having pain? Mild discomfort endorsed, denied pain  FALLS: Has patient fallen in last 6 months?  See PT evaluation for details  LIVING ENVIRONMENT: Lives with: lives with their spouse Lives in: House/apartment  PLOF:  Level of assistance: Needed assistance with [employer] Employment: Retired  PATIENT GOALS: improved communication  OBJECTIVE:  Note: Objective measures were completed at Evaluation unless otherwise noted.  COGNITION: Overall cognitive status: Impaired Areas of  impairment: Memory Comments: Mild cognitive impairment per neuropsych assessment. Reports challenges in completed to-do list items, forgetting evening meds, recall of information from conversation with spouse   ORAL MOTOR EXAMINATION: Overall status: WFL  MOTOR SPEECH EVALUATON assessed across variety of speech tasks: reading, word repetition, generative discourse sample Overall motor speech: impaired Level of impairment: Word Rate of Speech: Reduced Dysfluencies: none evidenced Phonation: low vocal intensity Conversational loudness average: 65 dB Voice Quality: normal Respiration: thoracic breathing Word and Phrasal Stress: reduced use of stress Resonance: WFL Articulation: Appears intact Diadochokinetic Rate (DDK): poorly sequenced Intelligibility: Intelligible Motor planning: Appears intact Effective technique: increased vocal intensity and intentional speech  Stimulability trials: Given SLP modeling and occasional min cues, loudness average increased to 72 dB at the reading (paragraph) level.  Comments: Pt with teach back of intentional speech at onset of session.   Completed audio recording of patients baseline voice without cueing from SLP: No, pt did not have phone with him  DYSPHAGIA EVALUATION SUBJECTIVE DYSPHAGIA REPORTS: Reported symptoms: coughing with both solids and liquids and globus sensation  Current diet: regular and thin liquids  Co-morbid voice changes: No  Motor speech: dysarthria in setting of PD  FACTORS WHICH MAY INCREASE RISK OF ADVERSE EVENT IN PRESENCE OF ASPIRATION:  General health: well appearing  Risk factors: none evident     CLINICAL SWALLOW ASSESSMENT:   Dentition: adequate natural dentition Vocal quality at baseline: low vocal intensity Patient directly observed with POs: Yes: thin liquids  Feeding: able to feed self Liquids provided by: cup [facility] Swallow Protocol: Fail: unable to drink 3oz. No overt s/sx aspiration.  Oral phase signs  and symptoms: none Pharyngeal phase signs and symptoms: none                                                                                                                             TREATMENT DATE:   05/24/24: Enters with WNL volume- Speak out warm up, ah, glide and counting all hit dB target with rare min A. We did not use the booklet, Demarqus recalled and completed these from memory - sustained for 10 seconds and glides 10x with mod I. Counting stopping every 5 numbers to 20 completed with mod I without workbook. He has not completed online sessions or E Library as his computer is down. Cognitive exercises with mental math completed with average 80dB and rare min A. Conversation required occasional min A to maintain volume without trailing off.   05/12/24: Entered w/ WNL volume, which quickly faded to mid  60s dB. Solely completing warm-up and counting exercises at home. Indicated difficulty completing whole lesson independently. Recommended online Speak Out! Lessons to aid completion. Demonstrated how to navigate [employer], with handout provided to support recall. Targeted Lesson #5, with rare min A provided to achieve targeted dB levels:  Sustained AH: 84 dB  Counting: 85 dB Reading: 85 dB Conversation Exercise: 81 dB Discourse Level: 70 dB with usual min A to carryover intent  Endorsed recently improved dysphagia. Re-educated intentional swallow with all consistencies, in which pt verbalized understanding. No overt s/sx of aspiration exhibited with thin liquids today.   05/06/24: Reviewed [facility] [employer] website and how to navigate to the What is Parkinson's Video. Instructed he and [name] to watch the video.  Targeted volume and intelligibility using Speak Out! Lesson 4. Pt required occasional  verbal cues, modeling, for intent and warm ups smooth and connected like Lucie He has been completing online practice sessions. Pt averages the following volume  levels:  Sustained AH: 84dB average  Counting:86dB  Reading (paragraphs): 83dB  Conversation Exercise: 82dB Required usual mod verbal cues, modeling for carryover of intent /volume reading, ,role playing his name, spelling his name, giving address, DOB for checking in and making doctors' appointments - required usual verbal clues and modeling to spell with pauses between each letter. Read and role play ordering at [employer] and [employer] his usual orders. Generated and practiced script to alert listener that he has PD and trouble speaking. Add this to HEP  see Patient instructions. Targeted smiling with intent when asking the waitress I need a minute to avoid being seen as angry or rude. Education and script to explain facial expression as well.   04/13/24:Target improving vocal quality and increasing intensity through progressively difficulty speech tasks using Speak Out! program, lesson 1. ST leads pt through exercises providing usual model prior to pt execution. rare min-A required to achieve target dB this date. Averages this date:  Sustained ah 88-92 dB Counting 89 dB Reading 85 dB Cognitive speech task 83 dB  Conversational sample of approx 5 minutes, pt averages 69 dB with occasional mod-A.   PATIENT EDUCATION: Education details: POC, goals, HEP Person educated: Patient Education method: Medical illustrator Education comprehension: verbalized understanding, returned demonstration, and needs further education  HOME EXERCISE PROGRAM: Speak Out BID   GOALS: Goals reviewed with patient? Yes  SHORT TERM GOALS: Target date: 05/18/2024  Pt will meet or exceed target dB for all Speak Out! therapy tasks with occasional min-A over 2 sessions  Baseline: Goal status: MET  2.  Pt will maintain 70+ dB in 5 minute conversation over 2 sessions  Baseline:  Goal status: ONGOING  3.  Pt will evidence understanding of swallow compensations/strategies via teach back with  mod-I Baseline:  Goal status: MET  4.  Pt will employ strategies to ensure attention to conversation with reported benefit over 1 week period Baseline:  Goal status: ONGOING  5.  Pt will be complaint with prescribed HEP over 1 week period Baseline:  Goal status: ONGOING   LONG TERM GOALS: Target date: 06/22/2024  Pt will maintain 70+ dB over 15 minute conversation with rare min-A over 2 sessions Baseline:  Goal status: ONGOING  2.  Pt will verbalize plan for HEP for ongoing practice following ST d/c  Baseline:  Goal status: ONGOING  3.  Pt will utilize external aid resulting in no missed evening med doses over 2 week period  Baseline:  Goal status:  ONGOING  4.  Pt will complete daily to-do list tasks with use of strategies/compensations over 1 week period  Baseline:  Goal status: ONGOING  ASSESSMENT:  CLINICAL IMPRESSION: Patient is a 73 y.o. M who was seen today for ST tx in setting of PD. Pt presents with mild dysarthria c/b low vocal intensity and volume decay. Pt known to clinic from prior ST course, familiar with Speak Out! program. Additionally presents with complaints of mild cognitive changes, primarily with regards to memory. Pt presents with mild oropharyngeal dysphagia c/b globus sensation and occasional coughing with liquids and solids. Suspect is 2/2 PD, will plan to teach strategies and compensations, defer MBS for this time. Stable pulmonary status, able to eat regular and thin diet. Continued education and instruction of Speak Out! Program and principles, with usual min/mod A to carryover into conversation.   OBJECTIVE IMPAIRMENTS: Objective impairments include memory, dysarthria, and dysphagia. These impairments are limiting patient from household responsibilities, effectively communicating at home and in community, and safety when swallowing.Factors affecting potential to achieve goals and functional outcome are co-morbidities. Patient will benefit from skilled  SLP services to address above impairments and improve overall function.  REHAB POTENTIAL: Good  PLAN:  SLP FREQUENCY: 2x/week  SLP DURATION: 10 weeks  PLANNED INTERVENTIONS: Aspiration precaution training, Pharyngeal strengthening exercises, Cueing hierachy, Cognitive reorganization, Internal/external aids, Functional tasks, SLP instruction and feedback, Compensatory strategies, and Patient/family education    Mathis Leita Caldron, CCC-SLP 05/24/2024, 10:17 AM

## 2024-05-26 ENCOUNTER — Ambulatory Visit: Admitting: Physical Therapy

## 2024-05-26 ENCOUNTER — Ambulatory Visit: Admitting: Occupational Therapy

## 2024-05-26 ENCOUNTER — Encounter: Payer: Self-pay | Admitting: Speech Pathology

## 2024-05-26 ENCOUNTER — Encounter: Payer: Self-pay | Admitting: Physical Therapy

## 2024-05-26 ENCOUNTER — Ambulatory Visit: Admitting: Speech Pathology

## 2024-05-26 VITALS — BP 113/62 | HR 66

## 2024-05-26 DIAGNOSIS — R471 Dysarthria and anarthria: Secondary | ICD-10-CM

## 2024-05-26 DIAGNOSIS — R29818 Other symptoms and signs involving the nervous system: Secondary | ICD-10-CM

## 2024-05-26 DIAGNOSIS — R278 Other lack of coordination: Secondary | ICD-10-CM | POA: Diagnosis not present

## 2024-05-26 DIAGNOSIS — R2681 Unsteadiness on feet: Secondary | ICD-10-CM

## 2024-05-26 DIAGNOSIS — R2689 Other abnormalities of gait and mobility: Secondary | ICD-10-CM

## 2024-05-26 DIAGNOSIS — M6281 Muscle weakness (generalized): Secondary | ICD-10-CM

## 2024-05-26 NOTE — Therapy (Signed)
 OUTPATIENT PHYSICAL THERAPY NEURO TREATMENT/DISCHARGE SUMMARY   Patient Name: Joseph Lamb MRN: [id number] DOB:09/19/1951, 73 y.o., male Today's Date: 05/26/2024   PCP: Loreli Kins, MD REFERRING PROVIDER: Evonnie Asberry RAMAN, DO  END OF SESSION:  PT End of Session - 05/26/24 1104     Visit Number 8    Number of Visits 9   plus eval   Date for PT Re-Evaluation 06/09/24   Recert to cover remaining visits   Authorization Type [employer]    PT Start Time 1102    PT Stop Time 1140    PT Time Calculation (min) 38 min    Equipment Utilized During Treatment Gait belt    Activity Tolerance Patient tolerated treatment well    Behavior During Therapy [employer] for tasks assessed/performed;Flat affect            Past Medical History:  Diagnosis Date   Arrhythmia    Atrial fibrillation 10/11/2021   Cerebrovascular disease    moderate per 2021 MRI   Cholecystitis 09/01/2021   Chronic kidney disease, stage 3 unspecified 10/11/2021   Dizzy spells 03/08/2020   Erectile dysfunction 10/11/2021   Essential hypertension 06/14/2015   Essential tremor    Glaucoma 08/31/2021   Hyperlipidemia 06/14/2015   Irregular heart beat    Mild cognitive impairment of uncertain or unknown etiology 05/21/2023   Mixed hyperlipidemia    Multiple lacunar infarcts    bilateral centrum semiovale and right parietal cortex.   Obesity 10/11/2021   Obstructive sleep apnea    no CPAP   Other male erectile dysfunction    Primary open angle glaucoma (POAG) of left eye, moderate stage 10/11/2021   Primary open-angle glaucoma, right eye, mild stage 10/11/2021   Renal insufficiency 09/06/2022   Sinus bradycardia 03/08/2020   Tremor 10/11/2021   Type 2 diabetes mellitus with other specified complication (HCC)    Type II diabetes mellitus 10/11/2021   Vascular dementia [facility])    Past Surgical History:  Procedure Laterality Date   CHOLECYSTECTOMY N/A 08/31/2021   Procedure: LAPAROSCOPIC CHOLECYSTECTOMY;   Surgeon: Lyndel Deward PARAS, MD;  Location: [facility] OR;  Service: General;  Laterality: N/A;   EYE SURGERY     Glaucoma surgery   PROSTATE SURGERY     Patient Active Problem List   Diagnosis Date Noted   Essential tremor    Mixed hyperlipidemia    Irregular heart beat    Other male erectile dysfunction    Type 2 diabetes mellitus with other specified complication (HCC)    Vascular dementia (HCC)    Mild cognitive impairment of uncertain or unknown etiology 05/21/2023   Obstructive sleep apnea    Multiple lacunar infarcts    Cerebrovascular disease    Renal insufficiency 09/06/2022   Atrial fibrillation 10/11/2021   Chronic kidney disease, stage 3 unspecified 10/11/2021   Obesity 10/11/2021   Erectile dysfunction 10/11/2021   Primary open angle glaucoma (POAG) of left eye, moderate stage 10/11/2021   Primary open-angle glaucoma, right eye, mild stage 10/11/2021   Tremor 10/11/2021   Type II diabetes mellitus 10/11/2021   Cholecystitis 09/01/2021   Glaucoma 08/31/2021   Arrhythmia    Dizzy spells 03/08/2020   Sinus bradycardia 03/08/2020   Essential hypertension 06/14/2015   Hyperlipidemia 06/14/2015    ONSET DATE: 02/17/2024 (referral)   REFERRING DIAG: G20.A1 (ICD-10-CM) - 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   Pt accompanied by: self  PERTINENT HISTORY: PD, A fb, stage 3 CKD, HLD, HTN, multiple lacunar infarcts, obesity, type II diabetes  PAIN:  Are you having pain?  No  PRECAUTIONS: Fall  RED FLAGS: None   WEIGHT BEARING RESTRICTIONS: No  FALLS: Has patient fallen in last 6 months? No  LIVING ENVIRONMENT: Lives with: lives with their spouse Lives in: House/apartment Stairs: Yes: External: 2 in front, 8 in back steps; pt cannot recall if he has rails Has following equipment at home: shower chair and Grab bars  PLOF: Independent  PATIENT GOALS: I need to reengage the physical part of this therapy on a regular basis. I have been kind of lax   OBJECTIVE:  Note: Objective measures were completed at Evaluation unless otherwise noted.  DIAGNOSTIC FINDINGS: None relevant   COGNITION: Overall cognitive status: History of cognitive impairments - at baseline   SENSATION: Pt denies significant numbness/tingling in all extremities   EDEMA: None currently    MUSCLE TONE: RLE: Rigidity   POSTURE: rounded shoulders, forward head, increased thoracic kyphosis, weight shift right, and tremor in RUE   LOWER EXTREMITY ROM:     Active  Right Eval Left Eval  Hip flexion    Hip extension    Hip abduction    Hip adduction    Hip internal rotation    Hip external rotation    Knee flexion    Knee extension    Ankle dorsiflexion    Ankle plantarflexion    Ankle inversion    Ankle eversion     (Blank rows = not tested)  LOWER EXTREMITY MMT:    MMT Right Eval Left Eval  Hip flexion    Hip extension    Hip abduction    Hip adduction    Hip internal rotation    Hip external rotation    Knee flexion    Knee extension    Ankle dorsiflexion    Ankle plantarflexion    Ankle inversion    Ankle eversion    (Blank rows = not tested)  BED MOBILITY:  Not tested Pt reports he frequently sleeps on the couch. Is slow getting into/out of the bed   TRANSFERS: Sit to stand: Modified independence  Assistive device utilized: None  Stand to sit: Modified independence  Assistive device utilized: None      RAMP:  Not tested  CURB:  Not  tested  STAIRS: Not tested GAIT: Gait pattern: step through pattern, decreased arm swing- Right, decreased stride length, decreased hip/knee flexion- Right, decreased hip/knee flexion- Left, shuffling, lateral hip instability, decreased trunk rotation, trunk flexed, poor foot clearance- Right, and poor foot clearance- Left Distance walked: Various clinic distances  Assistive device utilized: None Level of assistance: Modified independence Comments: Lateral deviations noted when performing sit > stand> walk but otherwise no instability noted      VITALS  Vitals:   05/26/24 1107  BP: 113/62  Pulse: 66                                                                                                     TREATMENT:   Therapeutic Activity: Discussed getting scheduled for PD screens in 6 months with pt in agreement with plan Educated on importance of continued exercise with PD specific HEP and pt plans on going to the PWR moves classes  Assessed vitals (see above) and WNL  Pt reporting sometimes his back feels tired and asking about some stretches he can do to address this (but not laying on his back) Reviewed seated PWR Up and holding forward for a few more seconds for a low back stretch, pt reporting feeling good with this, performed 10 reps with holding in forward flexed position for about 10 seconds Performed modified child's pose with chair in front of pt with walking hands over to R/L, pt reporting feeling good with this stretch and it got the spot in his back that feels tight, gave pt handout of this for home Reviewed HEP below: Pt verbalizes understanding of standing/quadruped PWR moves Reviewed standing forward/posterior stepping strategy at countertop, performed 10 reps each side, pt has handout of this in his binder, but just needed a review on how to perform     [facility] PT Assessment - 05/26/24 1109       Ambulation/Gait   Gait velocity 8.6 seconds = 3.81 ft/sec   no AD      Standardized Balance Assessment   Standardized Balance Assessment Five Times Sit to Stand    Five times sit to stand comments  11.4 seconds      Mini-BESTest   Sit To Stand Normal: Comes to stand without use of hands and stabilizes independently.    Rise to Toes Normal: Stable for 3 s with maximum height.    Stand on one leg (left) Moderate: < 20 s   2 seconds, 11 seconds   Stand on one leg (right) Moderate: < 20 s   2.5, 6.2   Stand on one leg - lowest score 1    Compensatory Stepping Correction - Forward Moderate: More than one step is required to recover equilibrium   2 steps   Compensatory Stepping Correction - Backward Moderate: More than one step is required to recover equilibrium   multiple shuffled steps   Compensatory Stepping Correction - Left Lateral Moderate: Several steps to recover  equilibrium   2-3 steps   Compensatory Stepping Correction - Right Lateral Moderate: Several steps to recover equilibrium   2-3 steps   Stepping Corredtion Lateral - lowest score 1    Stance - Feet together, eyes open, firm surface  Normal: 30s    Stance - Feet together, eyes closed, foam surface  Normal: 30s    Incline - Eyes Closed Normal: Stands independently 30s and aligns with gravity    Change in Gait Speed Moderate: Unable to change walking speed or signs of imbalance   no major change   Walk with head turns - Horizontal Normal: performs head turns with no change in gait speed and good balance    Walk with pivot turns Normal: Turns with feet close FAST (< 3 steps) with good balance.    Step over obstacles Normal: Able to step over box with minimal change of gait speed and with good balance.    Timed UP & GO with Dual Task Normal: No noticeable change in sitting, standing or walking while backward counting when compared to TUG without    Mini-BEST total score 23      Timed Up and Go Test   Normal TUG (seconds) 9.6    Cognitive TUG (seconds) 9.3   starting at 77, counting backwards by 3, all  correct         PHYSICAL THERAPY DISCHARGE SUMMARY  Visits from Start of Care: 8  Current functional level related to goals / functional outcomes: See LTGs/Clinical Assessment Statement    Remaining deficits: Bradykinesia, impaired timing/coordination of gait, tremors, impaired high level balance.     Education / Equipment: HEP, PD community resources, PD education    Patient agrees to discharge. Patient goals were met. Patient is being discharged due to meeting the stated rehab goals. Plan for PD screens in 6 months    PATIENT EDUCATION: Education details: results of goals, D/C, continue HEP, plan for PD screens in 6 months  Person educated: Patient Education method: Explanation, Demonstration, Verbal cues, and Handouts Education comprehension: verbalized understanding, returned demonstration, verbal cues required, and needs further education  HOME EXERCISE PROGRAM: To be reviewed from previous POC:  [id number], standing/quadruped PWR moves, fwd/retro stepping    Access Code: [email or web address] URL: [email or web address] Date: 05/26/2024 Prepared by: Sheffield Senate  Exercises - Seated Child's Pose with Table  - 1 x daily - 7 x weekly - 10 reps - 10 hold  GOALS: Goals reviewed with patient? Yes  STG = LTG DUE TO POC LENGTH    LONG TERM GOALS: Target date: 05/11/2024   Pt will be independent with PD-specific HEP for improved strength, balance, transfers and gait.  Baseline: not performing regularly  Goal status: IN PROGRESS   2.  Pt will score >/= 23/28 on MiniBest for improved stepping strategies and reduced fall risk  Baseline: 20/28 Goal status: IN PROGRESS   3.  Pt will perform 5 sit to stands from 14 height w/no UE support for improved functional BLE strength and transfers  Baseline: reports difficulty standing from the couch  Goal status: MET  4.  Pt will perform floor transfer mod I for improved functional BLE strength and transferring into/out  of bathtub.  Baseline:  Goal status: MET   NEW LONG TERM GOALS FOR EXTENDED POC: Target date: 06/09/2024   Pt will be independent with PD-specific HEP for improved strength, balance, transfers and gait.  Baseline: pt reporting trying to perform more regularly, has a binder and exercise chart  Goal status: MET  2.  Pt will score >/= 23/28 on MiniBest for improved stepping strategies and reduced fall risk  Baseline: 20/28  23/28 on 8/20 Goal status: MET    ASSESSMENT:  CLINICAL IMPRESSION: Today's skilled session focused on assessing LTGs for D/C. Pt has met both of his LTGs. Pt reports that he is being more consistent with performing his HEP at home and is also planning on attending PWR moves classes after finishing therapy. Pt met LTG in regards to miniBEST, pt scored a 23/28 today (previously 20/28), decr pt's risk of falls. Pt continues with difficulty with all directions of stepping strategy, pt able to keep his balance but does require taking a few steps instead of one solid one. Reviewed his HEP of exercises to address this. Due to meeting his goals, pt will be discharged from PT at this time with plan for PD screens in 6 months. Pt in agreement with D/C and this plan.     OBJECTIVE IMPAIRMENTS: Abnormal gait, decreased activity tolerance, decreased balance, decreased cognition, decreased coordination, decreased knowledge of use of DME, difficulty walking, decreased strength, decreased safety awareness, impaired perceived functional ability, improper body mechanics, postural dysfunction, and pain  ACTIVITY LIMITATIONS: carrying, lifting, squatting, stairs, locomotion level, and caring for others  PARTICIPATION LIMITATIONS: driving, shopping, community activity, and yard work  PERSONAL FACTORS: Fitness and 1 comorbidity: PD are also affecting patient's functional outcome.   REHAB POTENTIAL: Good  CLINICAL DECISION MAKING: Evolving/moderate complexity  EVALUATION COMPLEXITY:  Moderate  PLAN:  PT FREQUENCY: 2x/week  PT DURATION: 4 weeks + 4 weeks (recert)   PLANNED INTERVENTIONS: 02835- PT Re-evaluation, 97750- Physical Performance Testing, 97110-Therapeutic exercises, 97530- Therapeutic activity, [id number]- Neuromuscular re-education, 97535- Self Care, 02859- Manual therapy, [phone]- Gait training, [phone]- Canalith repositioning, [id number]- Aquatic Therapy, [phone] (1-2 muscles), [phone] (3+ muscles)- Dry Needling, Patient/Family education, Balance training, Stair training, Joint mobilization, Spinal mobilization, Vestibular training, and DME instructions  PLAN FOR NEXT SESSION: D/C   Sheffield LOISE Senate, PT, DPT 05/26/2024, 11:56 AM

## 2024-05-26 NOTE — Therapy (Signed)
 OUTPATIENT SPEECH LANGUAGE PATHOLOGY PARKINSON'S TREATMENT   Patient Name: Joseph Lamb MRN: [id number] DOB:[birth year], 73 y.o., male Today's Date: 05/26/2024  PCP: Dr. Joen Gentry REFERRING PROVIDER: Dr. Asberry Tat  END OF SESSION:  End of Session - 05/26/24 0931     Visit Number 5    Number of Visits 9    Date for SLP Re-Evaluation 06/22/24    SLP Start Time 0930    SLP Stop Time  1010    SLP Time Calculation (min) 40 min    Activity Tolerance Patient tolerated treatment well            Past Medical History:  Diagnosis Date   Arrhythmia    Atrial fibrillation 10/11/2021   Cerebrovascular disease    moderate per 2021 MRI   Cholecystitis 09/01/2021   Chronic kidney disease, stage 3 unspecified 10/11/2021   Dizzy spells 03/08/2020   Erectile dysfunction 10/11/2021   Essential hypertension 06/14/2015   Essential tremor    Glaucoma 08/31/2021   Hyperlipidemia 06/14/2015   Irregular heart beat    Mild cognitive impairment of uncertain or unknown etiology 05/21/2023   Mixed hyperlipidemia    Multiple lacunar infarcts    bilateral centrum semiovale and right parietal cortex.   Obesity 10/11/2021   Obstructive sleep apnea    no CPAP   Other male erectile dysfunction    Primary open angle glaucoma (POAG) of left eye, moderate stage 10/11/2021   Primary open-angle glaucoma, right eye, mild stage 10/11/2021   Renal insufficiency 09/06/2022   Sinus bradycardia 03/08/2020   Tremor 10/11/2021   Type 2 diabetes mellitus with other specified complication (HCC)    Type II diabetes mellitus 10/11/2021   Vascular dementia [facility])    Past Surgical History:  Procedure Laterality Date   CHOLECYSTECTOMY N/A 08/31/2021   Procedure: LAPAROSCOPIC CHOLECYSTECTOMY;  Surgeon: Lyndel Deward PARAS, MD;  Location: [facility] OR;  Service: General;  Laterality: N/A;   EYE SURGERY     Glaucoma surgery   PROSTATE SURGERY     Patient Active Problem List   Diagnosis Date Noted    Essential tremor    Mixed hyperlipidemia    Irregular heart beat    Other male erectile dysfunction    Type 2 diabetes mellitus with other specified complication (HCC)    Vascular dementia (HCC)    Mild cognitive impairment of uncertain or unknown etiology 05/21/2023   Obstructive sleep apnea    Multiple lacunar infarcts    Cerebrovascular disease    Renal insufficiency 09/06/2022   Atrial fibrillation 10/11/2021   Chronic kidney disease, stage 3 unspecified 10/11/2021   Obesity 10/11/2021   Erectile dysfunction 10/11/2021   Primary open angle glaucoma (POAG) of left eye, moderate stage 10/11/2021   Primary open-angle glaucoma, right eye, mild stage 10/11/2021   Tremor 10/11/2021   Type II diabetes mellitus 10/11/2021   Cholecystitis 09/01/2021   Glaucoma 08/31/2021   Arrhythmia    Dizzy spells 03/08/2020   Sinus bradycardia 03/08/2020   Essential hypertension 06/14/2015   Hyperlipidemia 06/14/2015    ONSET DATE: 02/17/24 referral date   REFERRING DIAG: G20.A1 (ICD-10-CM) - Parkinson's 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   PAIN:  Are you having pain? Mild discomfort  endorsed, denied pain  FALLS: Has patient fallen in last 6 months?  See PT evaluation for details  LIVING ENVIRONMENT: Lives with: lives with their spouse Lives in: House/apartment  PLOF:  Level of assistance: Needed assistance with IADLS Employment: Retired  PATIENT GOALS: improved communication  OBJECTIVE:  Note: Objective measures were completed at Evaluation unless otherwise noted.  COGNITION: Overall cognitive status: Impaired Areas of impairment: Memory Comments:  Mild cognitive impairment per neuropsych assessment. Reports challenges in completed to-do list items, forgetting evening meds, recall of information from conversation with spouse   ORAL MOTOR EXAMINATION: Overall status: WFL  MOTOR SPEECH EVALUATON assessed across variety of speech tasks: reading, word repetition, generative discourse sample Overall motor speech: impaired Level of impairment: Word Rate of Speech: Reduced Dysfluencies: none evidenced Phonation: low vocal intensity Conversational loudness average: 65 dB Voice Quality: normal Respiration: thoracic breathing Word and Phrasal Stress: reduced use of stress Resonance: WFL Articulation: Appears intact Diadochokinetic Rate (DDK): poorly sequenced Intelligibility: Intelligible Motor planning: Appears intact Effective technique: increased vocal intensity and intentional speech  Stimulability trials: Given SLP modeling and occasional min cues, loudness average increased to 72 dB at the reading (paragraph) level.  Comments: Pt with teach back of intentional speech at onset of session.   Completed audio recording of patients baseline voice without cueing from SLP: No, pt did not have phone with him  DYSPHAGIA EVALUATION SUBJECTIVE DYSPHAGIA REPORTS: Reported symptoms: coughing with both solids and liquids and globus sensation  Current diet: regular and thin liquids  Co-morbid voice changes: No  Motor speech: dysarthria in setting of PD  FACTORS WHICH MAY INCREASE RISK OF ADVERSE EVENT IN PRESENCE OF ASPIRATION:  General health: well appearing  Risk factors: none evident     CLINICAL SWALLOW ASSESSMENT:   Dentition: adequate natural dentition Vocal quality at baseline: low vocal intensity Patient directly observed with POs: Yes: thin liquids  Feeding: able to feed self Liquids provided by: cup [facility] Swallow Protocol: Fail: unable to drink 3oz. No overt s/sx aspiration.  Oral phase signs and symptoms: none Pharyngeal  phase signs and symptoms: none                                                                                                                             TREATMENT DATE:   05/26/24: Enters with WNL volume. He endorses practicing 1x a day - continue to encourage twice daily while he is taking skilled ST. Warm ups, sustained ah, glides, counting all hit dB targets with supervision cues. Reading jokes on [employer] website with average of 80dB and supervision cues. Math flash cards as cognitive exercise Joseph Lamb maintained average of 77dB with supervision cues. In conversation he averaged 72dB with occasional min A to ID and correct when he trails off.   05/24/24: Enters with WNL volume- Speak out warm up, ah, glide and counting all hit dB target with rare min A. We did not use the booklet, Joseph Lamb recalled and completed  these from memory - sustained for 10 seconds and glides 10x with mod I. Counting stopping every 5 numbers to 20 completed with mod I without workbook. He has not completed online sessions or E Library as his computer is down. Cognitive exercises with mental math completed with average 80dB and rare min A. Conversation required occasional min A to maintain volume without trailing off.   05/12/24: Entered w/ WNL volume, which quickly faded to mid 60s dB. Solely completing warm-up and counting exercises at home. Indicated difficulty completing whole lesson independently. Recommended online Speak Out! Lessons to aid completion. Demonstrated how to navigate [employer], with handout provided to support recall. Targeted Lesson #5, with rare min A provided to achieve targeted dB levels:  Sustained AH: 84 dB  Counting: 85 dB Reading: 85 dB Conversation Exercise: 81 dB Discourse Level: 70 dB with usual min A to carryover intent  Endorsed recently improved dysphagia. Re-educated intentional swallow with all consistencies, in which pt verbalized understanding. No overt s/sx of aspiration  exhibited with thin liquids today.   05/06/24: Reviewed Parkinson [employer] website and how to navigate to the What is Parkinson's Video. Instructed he and Joseph Lamb to watch the video.  Targeted volume and intelligibility using Speak Out! Lesson 4. Pt required occasional  verbal cues, modeling, for intent and warm ups smooth and connected like Joseph Lamb He has been completing online practice sessions. Pt averages the following volume levels:  Sustained AH: 84dB average  Counting:86dB  Reading (paragraphs): 83dB  Conversation Exercise: 82dB Required usual mod verbal cues, modeling for carryover of intent /volume reading, ,role playing his name, spelling his name, giving address, DOB for checking in and making doctors' appointments - required usual verbal clues and modeling to spell with pauses between each letter. Read and role play ordering at [employer] and [employer] his usual orders. Generated and practiced script to alert listener that he has PD and trouble speaking. Add this to HEP  see Patient instructions. Targeted smiling with intent when asking the waitress I need a minute to avoid being seen as angry or rude. Education and script to explain facial expression as well.   04/13/24:Target improving vocal quality and increasing intensity through progressively difficulty speech tasks using Speak Out! program, lesson 1. ST leads pt through exercises providing usual model prior to pt execution. rare min-A required to achieve target dB this date. Averages this date:  Sustained ah 88-92 dB Counting 89 dB Reading 85 dB Cognitive speech task 83 dB  Conversational sample of approx 5 minutes, pt averages 69 dB with occasional mod-A.   PATIENT EDUCATION: Education details: POC, goals, HEP Person educated: Patient Education method: Medical illustrator Education comprehension: verbalized understanding, returned demonstration, and needs further education  HOME EXERCISE PROGRAM: Speak  Out BID   GOALS: Goals reviewed with patient? Yes  SHORT TERM GOALS: Target date: 05/18/2024  Pt will meet or exceed target dB for all Speak Out! therapy tasks with occasional min-A over 2 sessions  Baseline: Goal status: MET  2.  Pt will maintain 70+ dB in 5 minute conversation over 2 sessions  Baseline:  Goal status: ONGOING  3.  Pt will evidence understanding of swallow compensations/strategies via teach back with mod-I Baseline:  Goal status: MET  4.  Pt will employ strategies to ensure attention to conversation with reported benefit over 1 week period Baseline:  Goal status: ONGOING  5.  Pt will be complaint with prescribed HEP over 1 week period Baseline:  Goal status: ONGOING   LONG TERM GOALS: Target date: 06/22/2024  Pt will maintain 70+ dB over 15 minute conversation with rare min-A over 2 sessions Baseline:  Goal status: ONGOING  2.  Pt will verbalize plan for HEP for ongoing practice following ST d/c  Baseline:  Goal status: ONGOING  3.  Pt will utilize external aid resulting in no missed evening med doses over 2 week period  Baseline:  Goal status: ONGOING  4.  Pt will complete daily to-do list tasks with use of strategies/compensations over 1 week period  Baseline:  Goal status: ONGOING  ASSESSMENT:  CLINICAL IMPRESSION: Patient is a 73 y.o. M who was seen today for ST tx in setting of PD. Pt presents with mild dysarthria c/b low vocal intensity and volume decay. Pt known to clinic from prior ST course, familiar with Speak Out! program. Additionally presents with complaints of mild cognitive changes, primarily with regards to memory. Pt presents with mild oropharyngeal dysphagia c/b globus sensation and occasional coughing with liquids and solids. Suspect is 2/2 PD, will plan to teach strategies and compensations, defer MBS for this time. Stable pulmonary status, able to eat regular and thin diet. Continued education and instruction of Speak Out! Program  and principles, with usual min/mod A to carryover into conversation.   OBJECTIVE IMPAIRMENTS: Objective impairments include memory, dysarthria, and dysphagia. These impairments are limiting patient from household responsibilities, effectively communicating at home and in community, and safety when swallowing.Factors affecting potential to achieve goals and functional outcome are co-morbidities. Patient will benefit from skilled SLP services to address above impairments and improve overall function.  REHAB POTENTIAL: Good  PLAN:  SLP FREQUENCY: 2x/week  SLP DURATION: 10 weeks  PLANNED INTERVENTIONS: Aspiration precaution training, Pharyngeal strengthening exercises, Cueing hierachy, Cognitive reorganization, Internal/external aids, Functional tasks, SLP instruction and feedback, Compensatory strategies, and Patient/family education    Joseph Lamb, CCC-SLP 05/26/2024, 10:12 AM

## 2024-05-31 ENCOUNTER — Ambulatory Visit: Admitting: Speech Pathology

## 2024-06-02 ENCOUNTER — Ambulatory Visit: Admitting: Speech Pathology

## 2024-06-15 ENCOUNTER — Encounter: Payer: Self-pay | Admitting: Podiatry

## 2024-06-15 ENCOUNTER — Ambulatory Visit (INDEPENDENT_AMBULATORY_CARE_PROVIDER_SITE_OTHER): Admitting: Podiatry

## 2024-06-15 DIAGNOSIS — M79675 Pain in left toe(s): Secondary | ICD-10-CM | POA: Diagnosis not present

## 2024-06-15 DIAGNOSIS — M79672 Pain in left foot: Secondary | ICD-10-CM

## 2024-06-15 DIAGNOSIS — M2011 Hallux valgus (acquired), right foot: Secondary | ICD-10-CM

## 2024-06-15 DIAGNOSIS — E1169 Type 2 diabetes mellitus with other specified complication: Secondary | ICD-10-CM | POA: Diagnosis not present

## 2024-06-15 DIAGNOSIS — M79674 Pain in right toe(s): Secondary | ICD-10-CM

## 2024-06-15 DIAGNOSIS — E119 Type 2 diabetes mellitus without complications: Secondary | ICD-10-CM

## 2024-06-15 DIAGNOSIS — B351 Tinea unguium: Secondary | ICD-10-CM | POA: Diagnosis not present

## 2024-06-15 DIAGNOSIS — M2012 Hallux valgus (acquired), left foot: Secondary | ICD-10-CM

## 2024-06-20 NOTE — Progress Notes (Signed)
  Subjective:  Patient ID: Joseph Lamb, male    DOB: 08/18/1951,  MRN: [id number]  [place] LITTIE Lowe presents to clinic today for preventative diabetic foot care for painful mycotic toenails of both feet that are difficult to trim. Pain interferes with daily activities and wearing enclosed shoe gear comfortably. Patient states he went to Urgent Care for left heel pain. He relates he had sharp pain in left heel. Chief Complaint  Patient presents with   Nail Problem    Thick painful toenails, 3 month follow up    PCP is Loreli Kins, MD.  Allergies  Allergen Reactions   Lisinopril Cough   Telmisartan Other (See Comments)    Other reaction(s): decreased kidney functions   Crestor [Rosuvastatin Calcium]      fatigue    Rosuvastatin Other (See Comments)    Other reaction(s): fatigue   Simvastatin Other (See Comments)    fatigue    Review of Systems: Negative except as noted in the HPI.  Objective: No changes noted in today's physical examination. There were no vitals filed for this visit. GERRETT LOMAN is a pleasant 73 y.o. male in NAD. AAO x 3.   Diabetic foot exam was performed with the following findings:   Vascular Examination: Capillary refill time immediate b/l. Palpable pedal pulses. Pedal hair present b/l. Pedal edema absent b/l. No pain with calf compression b/l. Skin temperature gradient WNL b/l. No cyanosis or clubbing b/l. No ischemia or gangrene noted b/l.   Neurological Examination: Sensation grossly intact b/l with 10 gram monofilament. Vibratory sensation intact b/l.   Dermatological Examination: Pedal skin with normal turgor, texture and tone b/l.  No open wounds. No interdigital macerations.   Toenails 1-5 b/l thick, discolored, elongated with subungual debris and pain on dorsal palpation.  No corns, calluses, nor porokeratotic lesions.  Musculoskeletal Examination: Muscle strength 5/5 to all lower extremity muscle groups bilaterally. HAV  with bunion b/l.  Minimal pain noted on palpation medial tubercle left heel.  Radiographs: None    Assessment/Plan: 1. Pain due to onychomycosis of toenails of both feet   2. Hallux valgus, acquired, bilateral   3. Pain of left heel   4. Type 2 diabetes mellitus with other specified complication, without long-term current use of insulin  (HCC)   5. Encounter for diabetic foot exam [facility])     -Patient was evaluated today. All questions/concerns addressed on today's visit. -Toenails 1-5 b/l were debrided in length and girth with sterile nail nippers and dremel without iatrogenic bleeding.  -Discussed heel pain LLE. He is to schedule appt with Dr. Silva if he becomes symptomatic again. He related understanding.. -Patient/POA to call should there be question/concern in the interim.   Return in about 3 months (around 09/14/2024).  Delon LITTIE Merlin, DPM     - [facility] LOCATION: [address] [place] [address]                   Office [phone]   [facility] LOCATION: [address] [place], [place] [address] Office [phone]

## 2024-08-09 ENCOUNTER — Other Ambulatory Visit: Payer: Self-pay | Admitting: Neurology

## 2024-08-23 LAB — LAB REPORT - SCANNED
Creatinine, POC: 295.8 mg/dL
EGFR: 36
TSH: 2.55 (ref 0.41–5.90)

## 2024-08-24 ENCOUNTER — Encounter: Payer: Self-pay | Admitting: Cardiology

## 2024-08-24 ENCOUNTER — Telehealth: Payer: Self-pay | Admitting: Cardiology

## 2024-08-24 NOTE — Telephone Encounter (Signed)
 Pt asking to switch to Dr. Anner, is this switch okay?

## 2024-08-24 NOTE — Telephone Encounter (Signed)
 Error

## 2024-08-24 NOTE — Telephone Encounter (Signed)
 STAT if patient feels like he/she is going to faint   Are you dizzy, lightheaded, or faint now? No   Have you passed out?  No   IF YES MOVE TO .SYNCOPECVD  Do you have any other symptoms? States at his PCP yesterday, he was told he is in afib   Have you checked your HR and BP (record if available)?  105?/50? Hr 72 - yesterday

## 2024-08-24 NOTE — Telephone Encounter (Signed)
 Spoke with [name] per DPR who states that the pt has been in afib, having dizziness and fatigue. Appointment offered today but declined. Appointment made for 08/25/24.

## 2024-08-25 ENCOUNTER — Ambulatory Visit

## 2024-08-25 ENCOUNTER — Encounter: Payer: Self-pay | Admitting: Cardiology

## 2024-08-25 ENCOUNTER — Ambulatory Visit: Attending: Cardiology | Admitting: Cardiology

## 2024-08-25 VITALS — BP 92/51 | HR 71 | Ht 72.0 in | Wt 206.8 lb

## 2024-08-25 DIAGNOSIS — R002 Palpitations: Secondary | ICD-10-CM

## 2024-08-25 DIAGNOSIS — E1169 Type 2 diabetes mellitus with other specified complication: Secondary | ICD-10-CM

## 2024-08-25 DIAGNOSIS — I10 Essential (primary) hypertension: Secondary | ICD-10-CM | POA: Diagnosis not present

## 2024-08-25 DIAGNOSIS — I4891 Unspecified atrial fibrillation: Secondary | ICD-10-CM | POA: Diagnosis not present

## 2024-08-25 DIAGNOSIS — E782 Mixed hyperlipidemia: Secondary | ICD-10-CM | POA: Diagnosis not present

## 2024-08-25 DIAGNOSIS — G4733 Obstructive sleep apnea (adult) (pediatric): Secondary | ICD-10-CM

## 2024-08-25 NOTE — Patient Instructions (Addendum)
 Medication Instructions:  Your physician has recommended you make the following change in your medication:   Stop Amlodipine   Keep a BP log for 2 weeks and return with the log for a nurse visit.  *If you need a refill on your cardiac medications before your next appointment, please call your pharmacy*   Lab Work: None ordered If you have labs (blood work) drawn today and your tests are completely normal, you will receive your results only by: MyChart Message (if you have MyChart) OR A paper copy in the mail If you have any lab test that is abnormal or we need to change your treatment, we will call you to review the results.   Testing/Procedures: A zio monitor was ordered today. It will remain on for 14 days. Remove 09/08/24. You will then return monitor and event diary in provided box. It takes 1-2 weeks for report to be downloaded and returned to us . We will call you with the results. If monitor falls off or has orange flashing light, please call Zio for further instructions.  Follow-Up: At [facility], you and your health needs are our priority.  As part of our continuing mission to provide you with exceptional heart care, we have created designated Provider Care Teams.  These Care Teams include your primary Cardiologist (physician) and Advanced Practice Providers (APPs -  Physician Assistants and Nurse Practitioners) who all work together to provide you with the care you need, when you need it.  We recommend signing up for the patient portal called MyChart.  Sign up information is provided on this After Visit Summary.  MyChart is used to connect with patients for Virtual Visits (Telemedicine).  Patients are able to view lab/test results, encounter notes, upcoming appointments, etc.  Non-urgent messages can be sent to your provider as well.   To learn more about what you can do with MyChart, go to [email or web address].    Your next appointment:   9 month(s)  The format  for your next appointment:   In Person  Provider:   Jennifer Crape, MD    Other Instructions none  Important Information About Sugar

## 2024-08-25 NOTE — Progress Notes (Signed)
 Cardiology Office Note:    Date:  08/25/2024   ID:  TEVITA Joseph Lamb, DOB June 08, 1951, MRN [id number]  PCP:  Loreli Kins, MD  Cardiologist:  Jennifer JONELLE Crape, MD   Referring MD: Loreli Kins, MD    ASSESSMENT:    1. Mixed hyperlipidemia   2. Essential hypertension   3. Obstructive sleep apnea   4. Type 2 diabetes mellitus with other specified complication, without long-term current use of insulin  (HCC)   5. Atrial fibrillation, unspecified type (HCC)    PLAN:    In order of problems listed above:  Primary prevention stressed with the patient.  Importance of compliance with diet medication stressed and patient verbalized standing. Paroxysmal atrial fibrillation:I discussed with the patient atrial fibrillation, disease process. Management and therapy including rate and rhythm control, anticoagulation benefits and potential risks were discussed extensively with the patient. Patient had multiple questions which were answered to patient's satisfaction. He gives history of palpitations occasionally and we will do a 2-week monitor to assess rhythm control.  He is on anticoagulation.  Fall precautions advised and he understands Essential hypertension: Low blood pressure.  Will stop amlodipine .  He will keep a track of blood pressures at home back in 2 weeks for possible blood pressure check. Sleep apnea: Sleep health issues were discussed. Mixed dyslipidemia: On lipid-lowering medications followed by primary care. Patient will be seen in follow-up appointment in 6 months or earlier if the patient has any concerns.    Medication Adjustments/Labs and Tests Ordered: Current medicines are reviewed at length with the patient today.  Concerns regarding medicines are outlined above.  Orders Placed This Encounter  Procedures   EKG 12-Lead   No orders of the defined types were placed in this encounter.    No chief complaint on file.    History of Present Illness:    Joseph Lamb is a 73 y.o. male.  Patient has past medical history of paroxysmal atrial fibrillation, diabetes mellitus, obstructive sleep apnea hypertension and dyslipidemia.  Has any problems at this time except for feeling weak.  He has not had a fall.  His family member is with him for this visit.  Time  Past Medical History:  Diagnosis Date   Arrhythmia    Atrial fibrillation 10/11/2021   Cerebrovascular disease    moderate per 2021 MRI   Cholecystitis 09/01/2021   Chronic kidney disease, stage 3 unspecified 10/11/2021   Dizzy spells 03/08/2020   Erectile dysfunction 10/11/2021   Essential hypertension 06/14/2015   Essential tremor    Glaucoma 08/31/2021   Hyperlipidemia 06/14/2015   Irregular heart beat    Mild cognitive impairment of uncertain or unknown etiology 05/21/2023   Mixed hyperlipidemia    Multiple lacunar infarcts    bilateral centrum semiovale and right parietal cortex.   Obesity 10/11/2021   Obstructive sleep apnea    no CPAP   Other male erectile dysfunction    Primary open angle glaucoma (POAG) of left eye, moderate stage 10/11/2021   Primary open-angle glaucoma, right eye, mild stage 10/11/2021   Renal insufficiency 09/06/2022   Sinus bradycardia 03/08/2020   Tremor 10/11/2021   Type 2 diabetes mellitus with other specified complication (HCC)    Type II diabetes mellitus 10/11/2021   Vascular dementia [facility])     Past Surgical History:  Procedure Laterality Date   CHOLECYSTECTOMY N/A 08/31/2021   Procedure: LAPAROSCOPIC CHOLECYSTECTOMY;  Surgeon: Lyndel Deward PARAS, MD;  Location: [facility] OR;  Service: General;  Laterality: N/A;  EYE SURGERY     Glaucoma surgery   PROSTATE SURGERY      Current Medications: Current Meds  Medication Sig   amLODipine  (NORVASC ) 5 MG tablet Take 5 mg by mouth every morning.   apixaban  (ELIQUIS ) 5 MG TABS tablet Take 1 tablet (5 mg total) by mouth 2 (two) times daily.   carbidopa -levodopa  (SINEMET  CR) 50-200 MG tablet TAKE 1  TABLET BY MOUTH AT BEDTIME   carbidopa -levodopa  (SINEMET ) 25-100 MG tablet Take 1 tablet by mouth 3 (three) times daily. 7am/11am/4pm   latanoprost  (XALATAN ) 0.005 % ophthalmic solution Place 1 drop into both eyes at bedtime.   pantoprazole  (PROTONIX ) 40 MG tablet Take 40 mg by mouth 2 (two) times daily.   pravastatin  (PRAVACHOL ) 40 MG tablet Take 1 tablet (40 mg total) by mouth daily.   Semaglutide,0.25 or 0.5MG /DOS, (OZEMPIC, 0.25 OR 0.5 MG/DOSE,) 2 MG/3ML SOPN Inject 0.5 mg into the skin once a week.   sucralfate (CARAFATE) 1 g tablet Take 1 g by mouth 2 (two) times daily.   tadalafil (CIALIS) 20 MG tablet Take 20 mg by mouth daily as needed for erectile dysfunction.     Allergies:   Lisinopril, Telmisartan, Crestor [rosuvastatin calcium], Rosuvastatin, and Simvastatin   Social History   Socioeconomic History   Marital status: Married    Spouse name: Not on file   Number of children: 2   Years of education: 15   Highest education level: Associate degree: occupational, scientist, product/process development, or vocational program  Occupational History   Occupation: Retired    Comment: worked at contractor  Tobacco Use   Smoking status: Never    Passive exposure: Never   Smokeless tobacco: Never  Vaping Use   Vaping status: Never Used  Substance and Sexual Activity   Alcohol use: Not Currently   Drug use: No   Sexual activity: Not Currently  Other Topics Concern   Not on file  Social History Narrative   Right handed   One story home   Drinks no caffeine, occ soda.   Lives with wife and son   Social Drivers of Corporate Investment Banker Strain: Not on file  Food Insecurity: Not on file  Transportation Needs: Not on file  Physical Activity: Not on file  Stress: Not on file  Social Connections: Not on file     Family History: The patient's family history includes Diabetes in his father and mother; Tremor in his mother.  ROS:   Please see the history of present illness.    All  other systems reviewed and are negative.  EKGs/Labs/Other Studies Reviewed:    The following studies were reviewed today: .SABRAEKG Interpretation Date/Time:  Wednesday August 25 2024 14:09:01 EST Ventricular Rate:  71 PR Interval:  162 QRS Duration:  90 QT Interval:  402 QTC Calculation: 436 R Axis:   14  Text Interpretation: Sinus rhythm with Premature atrial complexes in a pattern of bigeminy Minimal voltage criteria for LVH, may be normal variant Inferior infarct , age undetermined Cannot rule out Anterior infarct , age undetermined T wave abnormality, consider lateral ischemia Abnormal ECG When compared with ECG of 03-Jan-2024 15:20, Premature ventricular complexes are no longer Present Premature atrial complexes are now Present Inferior infarct is now Present Inverted T waves have replaced nonspecific T wave abnormality in Lateral leads Confirmed by Edwyna Backers [phone]) on 08/25/2024 2:26:45 PM     Recent Labs: No results found for requested labs within last 365 days.  Recent Lipid Panel  No results found for: CHOL, TRIG, HDL, CHOLHDL, VLDL, LDLCALC, LDLDIRECT  Physical Exam:    VS:  BP (!) 92/51   Pulse 71   Ht 6' (1.829 m)   Wt 206 lb 12.8 oz (93.8 kg)   SpO2 96%   BMI 28.05 kg/m     Wt Readings from Last 3 Encounters:  08/25/24 206 lb 12.8 oz (93.8 kg)  03/16/24 221 lb 9.6 oz (100.5 kg)  01/15/24 222 lb (100.7 kg)     GEN: Patient is in no acute distress HEENT: Normal NECK: No JVD; No carotid bruits LYMPHATICS: No lymphadenopathy CARDIAC: Hear sounds regular, 2/6 systolic murmur at the apex. RESPIRATORY:  Clear to auscultation without rales, wheezing or rhonchi  ABDOMEN: Soft, non-tender, non-distended MUSCULOSKELETAL:  No edema; No deformity  SKIN: Warm and dry NEUROLOGIC:  Alert and oriented x 3 PSYCHIATRIC:  Normal affect   Signed, Jennifer JONELLE Crape, MD  08/25/2024 2:30 PM    [facility]

## 2024-09-08 ENCOUNTER — Ambulatory Visit

## 2024-09-08 NOTE — Progress Notes (Signed)
   Nurse Visit   Date of Encounter: 09/08/2024 ID: JOJO PEHL, DOB 10-04-1951, MRN [id number]  PCP:  Loreli Kins, MD   [facility] Providers Cardiologist:  Dr. Edwyna Finn to update primary MD,subspecialty MD or APP then REFRESH:1}     Visit Details   VS:  BP (!) 100/54 (BP Location: Left Arm, Patient Position: Sitting, Cuff Size: Normal)   Pulse 64   Resp 20   Wt 210 lb 0.8 oz (95.3 kg)   SpO2 99%   BMI 28.49 kg/m  , BMI Body mass index is 28.49 kg/m.  Wt Readings from Last 3 Encounters:  09/08/24 210 lb 0.8 oz (95.3 kg)  08/25/24 206 lb 12.8 oz (93.8 kg)  03/16/24 221 lb 9.6 oz (100.5 kg)     Reason for visit: Perform Vitals Performed today: Vitals, Education and Provider consulted Changes (medications, testing, etc.) : No new orders at this time Length of Visit: 20 minutes    Medications Adjustments/Labs and Tests Ordered: No orders of the defined types were placed in this encounter.  No orders of the defined types were placed in this encounter.    Signed, Charlie LILLETTE Free, RN  09/08/2024 1:10 PM

## 2024-09-15 ENCOUNTER — Ambulatory Visit: Admitting: Podiatry

## 2024-09-15 ENCOUNTER — Other Ambulatory Visit: Payer: Self-pay | Admitting: Cardiology

## 2024-09-15 ENCOUNTER — Encounter: Payer: Self-pay | Admitting: Podiatry

## 2024-09-15 DIAGNOSIS — I4891 Unspecified atrial fibrillation: Secondary | ICD-10-CM

## 2024-09-15 DIAGNOSIS — B351 Tinea unguium: Secondary | ICD-10-CM | POA: Diagnosis not present

## 2024-09-15 DIAGNOSIS — M79674 Pain in right toe(s): Secondary | ICD-10-CM

## 2024-09-15 DIAGNOSIS — M79675 Pain in left toe(s): Secondary | ICD-10-CM | POA: Diagnosis not present

## 2024-09-15 NOTE — Progress Notes (Signed)
°  Subjective:  Patient ID: Joseph Lamb, male    DOB: 1951-01-25,  MRN: [id number]  [place] LITTIE Lowe presents to clinic today for preventative diabetic foot care for painful thick toenails that are difficult to trim. Pain interferes with ambulation. Aggravating factors include wearing enclosed shoe gear. Pain is relieved with periodic professional debridement.  Chief Complaint  Patient presents with   [facility]    Rm16 Diabetic foot care/ Dr. Joen Gentry   New problem(s): None.   PCP is Gentry Joen, MD.  Allergies  Allergen Reactions   Lisinopril Cough   Telmisartan Other (See Comments)    Other reaction(s): decreased kidney functions   Crestor [Rosuvastatin Calcium]      fatigue    Rosuvastatin Other (See Comments)    Other reaction(s): fatigue   Simvastatin Other (See Comments)    fatigue    Review of Systems: Negative except as noted in the HPI.  Objective: No changes noted in today's physical examination. There were no vitals filed for this visit. [name] is a pleasant 73 y.o. male in NAD. AAO x 3.   Vascular Examination: Capillary refill time immediate b/l. Palpable pedal pulses. Pedal hair present b/l. No pedal edema. No pain with calf compression b/l. Skin temperature gradient WNL b/l. No cyanosis or clubbing b/l. No ischemia or gangrene noted b/l.   Neurological Examination: Sensation grossly intact b/l with 10 gram monofilament. Vibratory sensation intact b/l.   Dermatological Examination: Pedal skin with normal turgor, texture and tone b/l.  No open wounds. No interdigital macerations.   Toenails 1-5 b/l thick, discolored, elongated with subungual debris and pain on dorsal palpation.   No hyperkeratotic nor porokeratotic lesions.  Musculoskeletal Examination: Muscle strength 5/5 to all lower extremity muscle groups bilaterally. HAV with bunion deformity noted b/l LE.  Radiographs: None  Assessment/Plan: 1. Pain due to onychomycosis of  toenails of both feet     Consent given for treatment. Patient examined. All patient's and/or POA's questions/concerns addressed on today's visit. Mycotic toenails 1-5 b/l debrided in length and girth without incident. Continue foot and shoe inspections daily. Monitor blood glucose per PCP/Endocrinologist's recommendations.Continue soft, supportive shoe gear daily. Report any pedal injuries to medical professional. Call office if there are any quesitons/concerns. -Patient/POA to call should there be question/concern in the interim.   Return in about 3 months (around 12/14/2024).  Delon LITTIE Merlin, DPM      [place] LOCATION: [address] [place] [address]                   Office [phone]   [facility] LOCATION: [address] [place], [place] [address] Office [phone]

## 2024-09-24 NOTE — Progress Notes (Signed)
 "   Assessment/Plan:   1.  Idiopathic Parkinson's disease, moderate             -Diagnosis was recently made, but likely has had this for a number of years             - increase carbidopa /levodopa  25/100, 2 at 7am, 2 at 11am, 1 at 4pm.  Discussed compliance in detail  -continue carbidopa /levodopa  50/200 CR at bedtime for cramping and first morning on.  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        2.  Memory change             -Neurocognitive testing in February, 2024 with evidence of mild cognitive impairment.  This was largely stable compared to prior testing with Dr. Jackquline in 2021.             -Dr. Richie noted that the patient was on donepezil , but felt that the medication was not necessary currently.  Patient has had intermittent bradycardia, and he and I discussed that donepezil  can contribute to this (not sure that this is entirely the cause but his HR is very low).  His primary care is prescribing this, and I recommended today that perhaps we go ahead and hold that since it is likely not needed right now anyway.   3.  Sialorrhea             -This is commonly associated with PD.  We talked about treatments.  The patient is not a candidate for oral anticholinergic therapy because of increased risk of confusion and falls.  We discussed Botox (type A and B) and 1% atropine drops.  We discusssed that candy like lemon drops can help by stimulating mm of the oropharynx to induce swallowing.  He wants to hold on this for now.     4.  Mood change             -Wife asks about lorazepam, but I do not recommend this regularly in a Parkinson's population due to fall risk.             -declines SSRI  5.  HTN  -has had dizziness and cardiology has tried to d/c the amlodipine  but the BP went up so they resumed at 5 mg.  6.  Weight loss  -due to ozempic  7. Constipation  -likely due to ozempic but also with Parkinsons Disease.   Subjective:   Joseph Lamb was  seen today in follow up for Parkinsons disease. Pt with wife who supplements hx.   My previous records were reviewed prior to todays visit as well as outside records available to me.  Last visit, I talked to the patient about the importance of getting back on the immediate release levodopa , as it appeared he was not taking it.  Dispense data from [facility] indicates that he is still only on the extended release at bedtime and immediate release tablet still has not been picked up.  He reports that he had these at home so has been using that.  He has some trouble getting in the middle of the day dose and will miss it 50% of the time.  No falls.  No hallucinations but has had some dizziness - his BP medication is being adjusted and he was off of it but his BP went up and they put him back on amlodipine  5 mg.  He has been to physical, occupational, speech therapy since last visit.  Current prescribed movement disorder medications: carbidopa /levodopa  25/100, 1 po tid Carbidopa /levodopa  50/200 CR at bedtime    ALLERGIES:   Allergies  Allergen Reactions   Lisinopril Cough   Telmisartan Other (See Comments)    Other reaction(s): decreased kidney functions   Crestor [Rosuvastatin Calcium]      fatigue    Rosuvastatin Other (See Comments)    Other reaction(s): fatigue   Simvastatin Other (See Comments)    fatigue    CURRENT MEDICATIONS:  Current Meds  Medication Sig   carbidopa -levodopa  (SINEMET  CR) 50-200 MG tablet TAKE 1 TABLET BY MOUTH AT BEDTIME   carbidopa -levodopa  (SINEMET ) 25-100 MG tablet Take 1 tablet by mouth 3 (three) times daily. 7am/11am/4pm   ELIQUIS  5 MG TABS tablet TAKE 1 TABLET(5 MG) BY MOUTH TWICE DAILY   latanoprost  (XALATAN ) 0.005 % ophthalmic solution Place 1 drop into both eyes at bedtime.   pantoprazole  (PROTONIX ) 40 MG tablet Take 40 mg by mouth 2 (two) times daily.   pravastatin  (PRAVACHOL ) 40 MG tablet Take 1 tablet (40 mg total) by mouth daily.   Semaglutide,0.25 or  0.5MG /DOS, (OZEMPIC, 0.25 OR 0.5 MG/DOSE,) 2 MG/3ML SOPN Inject 0.5 mg into the skin once a week.   sucralfate (CARAFATE) 1 g tablet Take 1 g by mouth 2 (two) times daily.   tadalafil (CIALIS) 20 MG tablet Take 20 mg by mouth daily as needed for erectile dysfunction.     Objective:   PHYSICAL EXAMINATION:    VITALS:   Vitals:   10/05/24 0911  BP: 112/60  Pulse: (!) 50  SpO2: 98%  Weight: 208 lb 9.6 oz (94.6 kg)      GEN:  The patient appears stated age and is in NAD. HEENT:  Normocephalic, atraumatic.  The mucous membranes are moist. The superficial temporal arteries are without ropiness or tenderness. Cv:  brady.  regular Lungs:  CTAB   Neurological examination:  Orientation: The patient is alert and oriented x3. Cranial nerves: There is good facial symmetry with  facial hypomimia. The speech is fluent and clear. Soft palate rises symmetrically and there is no tongue deviation. Hearing is intact to conversational tone. Sensation: Sensation is intact to light touch throughout Motor: Strength is at least antigravity x4.  Movement examination: Tone: There is mod increased tone in the L>RUE Abnormal movements: mild to mod rest tremor in the bilateral UE Coordination:  There is no decremation on the R and mild on the L Gait and Station: The patient ambulates well in the hall. The patient's stride length is just slightly decreased with bilateral UE rest tremor.  I have reviewed and interpreted the following labs independently    Chemistry      Component Value Date/Time   NA 140 09/02/2021 0328   K 3.8 09/02/2021 0328   CL 106 09/02/2021 0328   CO2 29 09/02/2021 0328   BUN 14 09/02/2021 0328   CREATININE 1.47 (H) 09/02/2021 0328      Component Value Date/Time   CALCIUM 8.6 (L) 09/02/2021 0328   ALKPHOS 75 09/02/2021 0328   AST 37 09/02/2021 0328   ALT 53 (H) 09/02/2021 0328   BILITOT 0.8 09/02/2021 0328       Lab Results  Component Value Date   WBC 11.1 (H)  09/02/2021   HGB 13.7 09/02/2021   HCT 42.5 09/02/2021   MCV 86.2 09/02/2021   PLT 189 09/02/2021    Lab Results  Component Value Date   TSH 3.440 07/24/2017    Cc:  Loreli,  Joen, MD  "

## 2024-09-28 ENCOUNTER — Ambulatory Visit: Payer: Self-pay | Admitting: Cardiology

## 2024-09-28 DIAGNOSIS — I499 Cardiac arrhythmia, unspecified: Secondary | ICD-10-CM

## 2024-09-28 DIAGNOSIS — R002 Palpitations: Secondary | ICD-10-CM

## 2024-09-28 DIAGNOSIS — I4891 Unspecified atrial fibrillation: Secondary | ICD-10-CM

## 2024-10-05 ENCOUNTER — Ambulatory Visit: Admitting: Neurology

## 2024-10-05 VITALS — BP 112/60 | HR 50 | Wt 208.6 lb

## 2024-10-05 DIAGNOSIS — K5901 Slow transit constipation: Secondary | ICD-10-CM

## 2024-10-05 DIAGNOSIS — K117 Disturbances of salivary secretion: Secondary | ICD-10-CM

## 2024-10-05 DIAGNOSIS — G20A1 Parkinson's disease without dyskinesia, without mention of fluctuations: Secondary | ICD-10-CM | POA: Diagnosis not present

## 2024-10-05 MED ORDER — CARBIDOPA-LEVODOPA ER 50-200 MG PO TBCR: 1.0000 | EXTENDED_RELEASE_TABLET | Freq: Every day | ORAL | 1 refills | Status: AC

## 2024-10-05 MED ORDER — CARBIDOPA-LEVODOPA 25-100 MG PO TABS: ORAL_TABLET | ORAL | 1 refills | Status: AC

## 2024-10-05 NOTE — Patient Instructions (Signed)
" °  VISIT SUMMARY: You came in today for a follow-up on your Parkinson's disease and medication management. We discussed your current medications, including carbidopa -levodopa  for Parkinson's, amlodipine  for hypertension, and Ozempic for diabetes. You mentioned occasional dizziness in the mornings and some difficulty with stiffness and mobility.  YOUR PLAN: -IDIOPATHIC PARKINSON'S DISEASE: Parkinson's disease is a disorder of the nervous system that affects movement. We have adjusted your carbidopa -levodopa  dosage to help manage your symptoms better. Please take two tablets of the 25/100 mg carbidopa -levodopa  at 7 AM and 11 AM, and one tablet at 4 PM. Continue taking the 50/200 mg extended-release tablet at bedtime. Ensure you adhere to this schedule and try to exercise for 30 minutes, five days a week. Your prescriptions have been refilled at [facility] on [address].  -HYPERTENSION: Hypertension is high blood pressure. You are back on amlodipine  at 5 mg daily to help manage your blood pressure. Please monitor your blood pressure regularly and report any significant changes.  -DIABETES MANAGEMENT: Diabetes is a condition that affects how your body processes blood sugar. You are currently on Ozempic, which you believe is causing weight loss. We will monitor your weight and consider alternative diabetes medications if necessary.  INSTRUCTIONS: Please follow the new medication schedule for carbidopa -levodopa  and ensure you take your amlodipine  daily. Continue monitoring your blood pressure and weight. If you experience any significant changes or have concerns, please contact our office. Your next follow-up appointment is scheduled for three months from today.  Constipation and Parkinson's disease:  1.Rancho recipe for constipation in Parkinsons Disease:  -1 cup of unprocessed bran (need to get this at [employer], [employer] or similar type of store), 2 cups of applesauce in 1 cup of prune juice 2.   Increase fiber intake (Metamucil,vegetables) 3.  Regular, moderate exercise can be beneficial. 4.  Avoid medications causing constipation, such as medications like antacids with calcium or magnesium 5.  It's okay to take daily Miralax , and taper if stools become too loose or you experience diarrhea 6.  Stool softeners (Colace) can help with chronic constipation and I recommend you take this daily. 7.  Increase water intake.  You should be drinking 1/2 gallon of water a day as long as you have not been diagnosed with congestive heart failure or renal/kidney failure.  This is probably the single greatest thing that you can do to help your constipation.                      Contains text generated by [employer].                                 Contains text generated by [employer].   "

## 2024-11-17 ENCOUNTER — Ambulatory Visit: Admitting: Cardiology

## 2024-11-30 ENCOUNTER — Ambulatory Visit: Admitting: Occupational Therapy

## 2024-11-30 ENCOUNTER — Ambulatory Visit: Admitting: Physical Therapy

## 2024-12-29 ENCOUNTER — Ambulatory Visit: Admitting: Podiatry

## 2025-04-05 ENCOUNTER — Ambulatory Visit: Payer: Self-pay | Admitting: Neurology
